# Patient Record
Sex: Male | Born: 1970
Health system: Southern US, Community
[De-identification: ages and names within clinical notes are randomized; demographics above are authoritative.]

## PROBLEM LIST (undated history)

## (undated) DIAGNOSIS — F1491 Cocaine use, unspecified, in remission: Secondary | ICD-10-CM

## (undated) DIAGNOSIS — R509 Fever, unspecified: Secondary | ICD-10-CM

## (undated) DIAGNOSIS — F411 Generalized anxiety disorder: Secondary | ICD-10-CM

## (undated) DIAGNOSIS — K604 Rectal fistula: Secondary | ICD-10-CM

## (undated) DIAGNOSIS — F1210 Cannabis abuse, uncomplicated: Secondary | ICD-10-CM

## (undated) DIAGNOSIS — R739 Hyperglycemia, unspecified: Secondary | ICD-10-CM

## (undated) DIAGNOSIS — F329 Major depressive disorder, single episode, unspecified: Secondary | ICD-10-CM

## (undated) DIAGNOSIS — N200 Calculus of kidney: Secondary | ICD-10-CM

## (undated) DIAGNOSIS — J45909 Unspecified asthma, uncomplicated: Secondary | ICD-10-CM

## (undated) DIAGNOSIS — Z87898 Personal history of other specified conditions: Secondary | ICD-10-CM

## (undated) DIAGNOSIS — K6289 Other specified diseases of anus and rectum: Secondary | ICD-10-CM

## (undated) DIAGNOSIS — J309 Allergic rhinitis, unspecified: Secondary | ICD-10-CM

## (undated) HISTORY — DX: Generalized anxiety disorder: F41.1

## (undated) HISTORY — DX: Fever, unspecified: R50.9

## (undated) HISTORY — DX: Cannabis abuse, uncomplicated: F12.10

## (undated) HISTORY — DX: Calculus of kidney: N20.0

## (undated) HISTORY — DX: Cocaine use, unspecified, in remission: F14.91

## (undated) HISTORY — DX: Major depressive disorder, single episode, unspecified: F32.9

## (undated) HISTORY — DX: Other specified diseases of anus and rectum: K62.89

## (undated) HISTORY — PX: OTHER SURGICAL HISTORY: SHX169

## (undated) HISTORY — DX: Rectal fistula: K60.4

## (undated) HISTORY — DX: Allergic rhinitis, unspecified: J30.9

## (undated) HISTORY — PX: FINGER SURGERY: SHX640

## (undated) HISTORY — DX: Personal history of other specified conditions: Z87.898

## (undated) HISTORY — PX: TONSILLECTOMY: SUR1361

## (undated) HISTORY — DX: Hyperglycemia, unspecified: R73.9

## (undated) HISTORY — DX: Unspecified asthma, uncomplicated: J45.909

---

## 2001-01-21 ENCOUNTER — Emergency Department (HOSPITAL_COMMUNITY): Admission: EM | Admit: 2001-01-21 | Discharge: 2001-01-21 | Payer: Self-pay | Admitting: Emergency Medicine

## 2001-01-21 ENCOUNTER — Encounter: Payer: Self-pay | Admitting: Emergency Medicine

## 2001-07-13 ENCOUNTER — Emergency Department (HOSPITAL_COMMUNITY): Admission: EM | Admit: 2001-07-13 | Discharge: 2001-07-13 | Payer: Self-pay | Admitting: Emergency Medicine

## 2001-07-13 ENCOUNTER — Encounter: Payer: Self-pay | Admitting: Emergency Medicine

## 2003-07-03 ENCOUNTER — Emergency Department (HOSPITAL_COMMUNITY): Admission: EM | Admit: 2003-07-03 | Discharge: 2003-07-03 | Payer: Self-pay | Admitting: Emergency Medicine

## 2004-08-01 ENCOUNTER — Observation Stay (HOSPITAL_COMMUNITY): Admission: EM | Admit: 2004-08-01 | Discharge: 2004-08-02 | Payer: Self-pay | Admitting: Emergency Medicine

## 2004-08-06 ENCOUNTER — Ambulatory Visit (HOSPITAL_COMMUNITY): Admission: RE | Admit: 2004-08-06 | Discharge: 2004-08-07 | Payer: Self-pay | Admitting: Orthopedic Surgery

## 2004-10-08 ENCOUNTER — Ambulatory Visit: Payer: Self-pay | Admitting: Internal Medicine

## 2005-03-31 ENCOUNTER — Ambulatory Visit: Payer: Self-pay | Admitting: Internal Medicine

## 2005-04-06 ENCOUNTER — Ambulatory Visit: Payer: Self-pay | Admitting: Internal Medicine

## 2005-04-16 ENCOUNTER — Ambulatory Visit: Payer: Self-pay | Admitting: Internal Medicine

## 2005-09-08 ENCOUNTER — Ambulatory Visit: Payer: Self-pay | Admitting: Internal Medicine

## 2005-09-15 ENCOUNTER — Emergency Department (HOSPITAL_COMMUNITY): Admission: EM | Admit: 2005-09-15 | Discharge: 2005-09-15 | Payer: Self-pay | Admitting: Emergency Medicine

## 2006-08-08 ENCOUNTER — Ambulatory Visit: Payer: Self-pay | Admitting: Endocrinology

## 2007-02-16 ENCOUNTER — Encounter: Payer: Self-pay | Admitting: Endocrinology

## 2007-02-16 DIAGNOSIS — F411 Generalized anxiety disorder: Secondary | ICD-10-CM

## 2007-02-16 HISTORY — DX: Generalized anxiety disorder: F41.1

## 2007-02-22 ENCOUNTER — Observation Stay (HOSPITAL_COMMUNITY): Admission: EM | Admit: 2007-02-22 | Discharge: 2007-02-23 | Payer: Self-pay | Admitting: Emergency Medicine

## 2007-03-13 ENCOUNTER — Emergency Department (HOSPITAL_COMMUNITY): Admission: EM | Admit: 2007-03-13 | Discharge: 2007-03-14 | Payer: Self-pay | Admitting: Emergency Medicine

## 2007-06-08 DIAGNOSIS — K604 Rectal fistula, unspecified: Secondary | ICD-10-CM

## 2007-06-08 HISTORY — DX: Rectal fistula, unspecified: K60.40

## 2007-06-08 HISTORY — DX: Rectal fistula: K60.4

## 2007-09-12 ENCOUNTER — Ambulatory Visit: Payer: Self-pay | Admitting: Internal Medicine

## 2007-09-12 DIAGNOSIS — J309 Allergic rhinitis, unspecified: Secondary | ICD-10-CM

## 2007-09-12 DIAGNOSIS — F329 Major depressive disorder, single episode, unspecified: Secondary | ICD-10-CM

## 2007-09-12 DIAGNOSIS — F3289 Other specified depressive episodes: Secondary | ICD-10-CM

## 2007-09-12 HISTORY — DX: Major depressive disorder, single episode, unspecified: F32.9

## 2007-09-12 HISTORY — DX: Allergic rhinitis, unspecified: J30.9

## 2007-09-12 HISTORY — DX: Other specified depressive episodes: F32.89

## 2007-09-14 ENCOUNTER — Telehealth: Payer: Self-pay | Admitting: Internal Medicine

## 2008-05-28 ENCOUNTER — Emergency Department (HOSPITAL_COMMUNITY): Admission: EM | Admit: 2008-05-28 | Discharge: 2008-05-29 | Payer: Self-pay | Admitting: Emergency Medicine

## 2009-01-22 ENCOUNTER — Ambulatory Visit: Payer: Self-pay | Admitting: Diagnostic Radiology

## 2009-01-22 ENCOUNTER — Emergency Department (HOSPITAL_BASED_OUTPATIENT_CLINIC_OR_DEPARTMENT_OTHER): Admission: EM | Admit: 2009-01-22 | Discharge: 2009-01-22 | Payer: Self-pay | Admitting: Emergency Medicine

## 2009-12-17 ENCOUNTER — Ambulatory Visit: Payer: Self-pay | Admitting: Internal Medicine

## 2009-12-17 ENCOUNTER — Telehealth: Payer: Self-pay | Admitting: Internal Medicine

## 2009-12-17 DIAGNOSIS — J45909 Unspecified asthma, uncomplicated: Secondary | ICD-10-CM | POA: Insufficient documentation

## 2009-12-17 DIAGNOSIS — N529 Male erectile dysfunction, unspecified: Secondary | ICD-10-CM

## 2009-12-17 HISTORY — DX: Unspecified asthma, uncomplicated: J45.909

## 2010-01-06 ENCOUNTER — Telehealth: Payer: Self-pay | Admitting: Internal Medicine

## 2010-01-08 ENCOUNTER — Telehealth: Payer: Self-pay | Admitting: Internal Medicine

## 2010-04-24 ENCOUNTER — Telehealth: Payer: Self-pay | Admitting: Internal Medicine

## 2010-07-07 NOTE — Progress Notes (Signed)
Summary: ALT med  Phone Note Call from Patient Call back at Home Phone [phone]   Caller: Patient Summary of Call: Pt called stating that Cymbalta has no generic and brand name is very expensive. Pt is requesting alternate generic medication. [facility] Initial call taken by: Margaret Pyle, CMA,  January 08, 2010 12:05 PM  Follow-up for Phone Call        ok to change to fluoxetine 20 mg per day - done per emr  Follow-up by: [name] MD,  January 08, 2010 12:37 PM  Additional Follow-up for Phone Call Additional follow up Details #1::        Pt informed via VM, told to call with any further questions or concerns Additional Follow-up by: Margaret Pyle, CMA,  January 08, 2010 1:15 PM    New/Updated Medications: FLUOXETINE HCL 20 MG CAPS (FLUOXETINE HCL) 1po once daily Prescriptions: FLUOXETINE HCL 20 MG CAPS (FLUOXETINE HCL) 1po once daily  #90 x 3   Entered and Authorized by:   [name] MD   Signed by:   [name] MD on 01/08/2010   Method used:   Electronically to        [facility]* (retail)       [address]       [place], [place]  [id number]       Ph: [phone]       Fax: [phone]   RxID:   [id number]

## 2010-07-07 NOTE — Progress Notes (Signed)
Summary: Medication change  Phone Note From Pharmacy   Caller: [facility]* Summary of Call: Pharmacy stating the patient is uninsured, is there a generic or cheaper drug that can be substituted for Cymbalta? Initial call taken by: [name] Ewing CMA Duncan Dull),  January 06, 2010 3:04 PM  Follow-up for Phone Call        pt must initiate call for any change of med/thanks Follow-up by: [name] MD,  January 06, 2010 3:17 PM

## 2010-07-07 NOTE — Progress Notes (Signed)
Summary: Symbicort Pt Assistance  Phone Note Call from Patient Call back at [facility] Phone [phone]   Summary of Call: PT CAN'T AFFORD THE SYMBICORT. [place] HAS A PT ASSISTANCE PROGRAM TO HELP PAY FOR IT FOR A YEAR.  HE WILL PRINT OUT THE FORM TO BRING IN TO BE SENT IN.  HE WANTS SAMPLES TO LAST UNTIL THIS IS FINISHED.  HE HAS 9 PUFFS LEFT ON HIS INHALER. Initial call taken by: Hilarie Fredrickson,  January 08, 2010 1:46 PM  Follow-up for Phone Call        2 inhalers provided to pt. PT Asst forwarded to [facility] until pt bring in paperwork Follow-up by: Margaret Pyle, CMA,  January 08, 2010 1:52 PM  Additional Follow-up for Phone Call Additional follow up Details #1::        Closed phone note while awaiting patient paperwork to be brought to the office. Additional Follow-up by: Lucious Groves CMA,  January 12, 2010 1:35 PM

## 2010-07-07 NOTE — Progress Notes (Signed)
Summary: Rx req  Phone Note Call from Patient Call back at Home Phone [phone]   Caller: Patient Summary of Call: Pt called requesting Rx for Cymbalta. [facility] Initial call taken by: Margaret Pyle, CMA,  December 17, 2009 1:39 PM  Follow-up for Phone Call        routine to robin Follow-up by: [name] MD,  December 17, 2009 4:46 PM    New/Updated Medications: CYMBALTA 30 MG CPEP (DULOXETINE HCL) 1 by mouth once daily Prescriptions: CYMBALTA 30 MG CPEP (DULOXETINE HCL) 1 by mouth once daily  #30 x 6   Entered by:   Scharlene Gloss CMA (AAMA)   Authorized by:   [name] MD   Signed by:   Scharlene Gloss CMA (AAMA) on 12/17/2009   Method used:   Electronically to        [facility]* (retail)       [address]       [place], [place]  [id number]       Ph: [phone]       Fax: [phone]   RxID:   [id number]

## 2010-07-07 NOTE — Progress Notes (Signed)
Summary: Rx change  Phone Note From Pharmacy   Caller: [facility] [phone] Summary of Call: Pharmacy called stating pt has Rx for Albuterol Rx that cost $45+. Pt is requesting to have RX changed to Ventolin which is $15 but pharmacy needs MD okay and refills (only 1 fill available). Okay to change inhaler and send with 3 refills to [facility]? Initial call taken by: Margaret Pyle, CMA,  April 24, 2010 10:05 AM  Follow-up for Phone Call        ok to change Follow-up by: [name] MD,  April 24, 2010 1:33 PM    New/Updated Medications: VENTOLIN HFA 108 (90 BASE) MCG/ACT AERS (ALBUTEROL SULFATE) use 2 puffs four times a day as needed Prescriptions: VENTOLIN HFA 108 (90 BASE) MCG/ACT AERS (ALBUTEROL SULFATE) use 2 puffs four times a day as needed  #1 x 3   Entered by:   Scharlene Gloss CMA (AAMA)   Authorized by:   [name] MD   Signed by:   Scharlene Gloss CMA (AAMA) on 04/24/2010   Method used:   Faxed to ...       [facility] (retail)       [address]       [place], [place]  [address]       Ph: [phone]       Fax: [phone]   RxID:   [id number]

## 2010-07-07 NOTE — Assessment & Plan Note (Signed)
Summary: difficulty breathing-lb   Vital Signs:  Patient profile:   40 year old male Height:      75 inches Weight:      392 pounds BMI:     49.17 O2 Sat:      95 % on Room air Temp:     98.5 degrees F oral Pulse rate:   66 / minute BP sitting:   130 / 84  (left arm) Cuff size:   large  Vitals Entered By: Zella Ball Ewing CMA Duncan Dull) (December 17, 2009 11:34 AM)  O2 Flow:  Room air CC: Difficulty breathing, right foot pain and  inflamation, refills/RE   CC:  Difficulty breathing, right foot pain and  inflamation, and refills/RE.  History of Present Illness: laid off from work apr 2009 , no insurance at this time;  has seasonal allergies , as well as ? perennial and finds it hard to get rid of the cat; here today iwth incr sob for 2 wks; fiancee states he wheezes at night but it does not wake him up;  goes to the gym 3 time per wk (was more often but had some plantar fasciits);  still doing some cardio such as treadmill  and "feels like I'm breathing through a straw."   feels tight, cant take big deep breath. Feels like when an inhaler helped  before.  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  Worse with swiimming in the pool .  no fever or signicant cough except for mild phelgm to back of throat to clear .   Denies worsening depressive symtpoms, suicidal ideation or panic  Preventive Screening-Counseling & Management  Alcohol-Tobacco     Smoking Status: quit      Drug Use:  no.    Problems Prior to Update: 1)  Erectile Dysfunction, Organic  (ICD-607.84) 2)  Asthma  (ICD-493.90) 3)  Preventive Health Care  (ICD-V70.0) 4)  Depression  (ICD-311) 5)  Allergic Rhinitis  (ICD-477.9) 6)  Anxiety  (ICD-300.00)  Medications Prior to Update: 1)  Dexedrine 15 Mg  Cp24 (Dextroamphetamine Sulfate) .... Take 1 By Mouth Three Times A Day Prn 2)  Trazodone Hcl 50 Mg  Tabs (Trazodone Hcl) .... Take 1-2 By Mouth At Bedtime Prn 3)  Cymbalta 30 Mg  Cpep  (Duloxetine Hcl) .... Take 1 By Mouth Qd 4)  Wellbutrin Xl 300 Mg  Tb24 (Bupropion Hcl) .Marland Kitchen.. 1po Qd 5)  Cetirizine Hcl 10 Mg  Tabs (Cetirizine Hcl) .Marland Kitchen.. 1 By Mouth Once Daily Prn  Current Medications (verified): 1)  Cetirizine Hcl 10 Mg  Tabs (Cetirizine Hcl) .Marland Kitchen.. 1 By Mouth Once Daily As Needed 2)  Symbicort 160-4.5 Mcg/act Aero (Budesonide-Formoterol Fumarate) .... 2 Puffs Two Times A Day 3)  Proair Hfa 108 (90 Base) Mcg/act Aers (Albuterol Sulfate) .... 2 Puffs Four Times Per Day As Needed Wheezing 4)  Prednisone 10 Mg Tabs (Prednisone) .... 4po Qd For 3days, Then 3po Qd For 3days, Then 2po Qd For 3days, Then 1po Qd For 3 Days, Then Stop 5)  Cialis 20 Mg Tabs (Tadalafil) .Marland Kitchen.. 1po Every Other Day As Needed  Allergies (verified): No Known Drug Allergies  Past History:  Past Surgical History: Last updated: 09/12/2007 s/p left knee ant cruciate ligament repair  Family History: Last updated: 09/12/2007 mother with MI at 47yo  Social History: Last updated: 12/17/2009 Alcohol use-yes laid off as RF engineer for [employer] Single, but with fiancee Drug use-no - quit marijuana - since  2009 Former Smoker - quit june 2011 no children  Risk Factors: Smoking Status: quit (12/17/2009)  Past Medical History: Anxiety Smoker  ADD Allergic rhinitis Depression Asthma  Family History: Reviewed history from 09/12/2007 and no changes required. mother with MI at 61yo  Social History: Reviewed history from 09/12/2007 and no changes required. 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   Head:  normocephalic and atraumatic.   Eyes:  vision grossly intact, pupils equal, and pupils round.   Ears:  R ear normal and L  ear normal.   Nose:  no external deformity and no nasal discharge.   Mouth:  no gingival abnormalities and pharynx pink and moist.   Neck:  supple and no masses.   Lungs:  normal respiratory effort, R decreased breath sounds, and L decreased breath sounds.  and few wheezes  Heart:  normal rate and regular rhythm.   Extremities:  no edema, no erythema    Impression & Recommendations:  Problem # 1:  ASTHMA (ICD-493.90)  exam c/w today;  no insurance and does not want further eval for other cuases dyspnea to start;  will give depo IM today, and inhalers for home, and prednisone pack;  f/iu any persistence or worsening   His updated medication list for this problem includes:    Symbicort 160-4.5 Mcg/act Aero (Budesonide-formoterol fumarate) .Marland Kitchen... 2 puffs two times a day    Proair Hfa 108 (90 Base) Mcg/act Aers (Albuterol sulfate) .Marland Kitchen... 2 puffs four times per day as needed wheezing    Prednisone 10 Mg Tabs (Prednisone) .Marland KitchenMarland KitchenMarland KitchenMarland Kitchen 4po qd for 3days, then 3po qd for 3days, then 2po qd for 3days, then 1po qd for 3 days, then stop  Problem # 2:  ERECTILE DYSFUNCTION, ORGANIC (ICD-607.84)  for cialis trial rx  His updated medication list for this problem includes:    Cialis 20 Mg Tabs (Tadalafil) .Marland Kitchen... 1po every other day as needed  Problem # 3:  ALLERGIC RHINITIS (ICD-477.9)  His updated medication list for this problem includes:    Cetirizine Hcl 10 Mg Tabs (Cetirizine hcl) .Marland Kitchen... 1 by mouth once daily as needed treat as above, f/u any worsening signs or symptoms   Problem # 4:  ANXIETY (ICD-300.00)  The following medications were removed from the medication list:    Trazodone Hcl 50 Mg Tabs (Trazodone hcl) .Marland Kitchen... Take 1-2 by mouth at bedtime prn    Cymbalta 30 Mg Cpep (Duloxetine hcl) .Marland Kitchen... Take 1 by mouth qd    Wellbutrin Xl 300 Mg Tb24 (Bupropion hcl) .Marland Kitchen... 1po qd stable overall by hx and exam, ok to continue meds/tx as is - no meds needed at this time  Complete Medication List: 1)   Cetirizine Hcl 10 Mg Tabs (Cetirizine hcl) .Marland Kitchen.. 1 by mouth once daily as needed 2)  Symbicort 160-4.5 Mcg/act Aero (Budesonide-formoterol fumarate) .... 2 puffs two times a day 3)  Proair Hfa 108 (90 Base) Mcg/act Aers (Albuterol sulfate) .... 2 puffs four times per day as needed wheezing 4)  Prednisone 10 Mg Tabs (Prednisone) .... 4po qd for 3days, then 3po qd for 3days, then 2po qd for 3days, then 1po qd for 3 days, then stop  5)  Cialis 20 Mg Tabs (Tadalafil) .Marland Kitchen.. 1po every other day as needed  Patient Instructions: 1)  you had the steroid shot today 2)  Please take all new medications as prescribed  3)  Continue all previous medications as before this visit  4)  Please schedule a follow-up appointment as needed. Prescriptions: CIALIS 20 MG TABS (TADALAFIL) 1po every other day as needed  #5 x 11   Entered and Authorized by:   [name] MD   Signed by:   [name] MD on 12/17/2009   Method used:   Print then Give to Patient   RxID:   [id number] CIALIS 20 MG TABS (TADALAFIL) 1po every other day as needed  #3 x 0   Entered and Authorized by:   [name] MD   Signed by:   [name] MD on 12/17/2009   Method used:   Print then Give to Patient   RxID:   [id number] PREDNISONE 10 MG TABS (PREDNISONE) 4po qd for 3days, then 3po qd for 3days, then 2po qd for 3days, then 1po qd for 3 days, then stop  #30 x 0   Entered and Authorized by:   [name] MD   Signed by:   [name] MD on 12/17/2009   Method used:   Print then Give to Patient   RxID:   [id number] PROAIR HFA 108 (90 BASE) MCG/ACT AERS (ALBUTEROL SULFATE) 2 puffs four times per day as needed wheezing  #1 x 11   Entered and Authorized by:   [name] MD   Signed by:   [name] MD on 12/17/2009   Method used:   Print then Give to Patient   RxID:   [id number] SYMBICORT 160-4.5 MCG/ACT AERO (BUDESONIDE-FORMOTEROL FUMARATE) 2 puffs two times a day  #1 x 11   Entered and Authorized by:    [name] MD   Signed by:   [name] MD on 12/17/2009   Method used:   Print then Give to Patient   RxID:   [id number] CETIRIZINE HCL 10 MG  TABS (CETIRIZINE HCL) 1 by mouth once daily as needed  #30 x 11   Entered and Authorized by:   [name] MD   Signed by:   [name] MD on 12/17/2009   Method used:   Print then Give to Patient   RxID:   [id number]   Appended Document: Med/Alg Import      Medication Administration  Injection # 1:    Medication: Depo- Medrol 40mg     Diagnosis: ASTHMA (ICD-493.90)    Route: IM    Site: LUOQ gluteus    Exp Date: 09/2012    Lot #: [id number]    Mfr: Pharmacia    Given by: Zella Ball Ewing CMA Duncan Dull) (December 17, 2009 1:04 PM)  Injection # 2:    Medication: Depo- Medrol 80mg     Diagnosis: ASTHMA (ICD-493.90)    Route: IM    Site: LUOQ gluteus    Exp Date: 09/2012    Lot #: [id number]    Mfr: Pharmacia    Given by: Zella Ball Ewing CMA Duncan Dull) (December 17, 2009 1:04 PM)  Orders Added: 1)  Depo- Medrol 40mg  [J1030] 2)  Depo- Medrol 80mg  [J1040] 3)  Admin of Therapeutic Inj  intramuscular or subcutaneous [57322]

## 2010-10-20 NOTE — H&P (Signed)
NAMEDAVED, MCFANN NO.:  [id number]   MEDICAL RECORD NO.:  [id number]          PATIENT TYPE:  EMS   LOCATION:  ED                           FACILITY:  [facility]   PHYSICIAN:  Lorne Skeens. Hoxworth, M.D.DATE OF BIRTH:  04/11/1971   DATE OF ADMISSION:  02/22/2007  DATE OF DISCHARGE:                              HISTORY & PHYSICAL   CHIEF COMPLAINT:  Rectal pain.   HISTORY OF PRESENT ILLNESS:  The patient is a 40 year old white male  who, 3-4 days ago, noticed some tenderness and soreness in his perianal  area.  This got gradually worse over the last several days with  increasing pain, tenderness and some swelling.  He developed fever up to  102 and some chills last 24 hours and presented to [facility]  Emergency Room.  He has no previous history of any similar symptoms or  problems.   PAST MEDICAL HISTORY:  Surgery includes  1. Reconstruction as right hand following an injury.  2. Knee arthroscopy.   Medically, he is followed Dr. Oliver Barre.   Does not take any chronic medications.   Denies any serious medical illnesses.   MEDICATIONS:  None.   ALLERGIES:  NONE.   SOCIAL HISTORY:  Married, smokes half pack cigarettes per day.  Drinks  rarely.  Works in Clinical biochemist.   FAMILY HISTORY:  Noncontributory.   REVIEW OF SYSTEMS:  GENERAL:  Positive for fever.  HEENT:  No vision,  hearing, swallowing problems.  RESPIRATORY:  Denies shortness breath,  cough, wheezing.  CARDIAC:  Denies chest pain, palpitations, swelling,  history of heart disease.  ABDOMEN/GI:  Denies nausea, vomiting,  constipation, abdominal pain.   PHYSICAL EXAMINATION:  VITAL SIGNS:  Temperature is 102.7, pulse 115,  respirations 22, blood pressure 140/108.  GENERAL:  He is a morbidly obese white male in no acute distress.  SKIN:  Warm and dry.  No rash or infection.  HEENT:  No palpable mass or thyromegaly.  Sclerae nonicteric.  LUNGS:  Clear without wheezing or increased  work of breathing.  CARDIAC:  Regular rate and rhythm.  No murmurs.  No edema.  No JVD.  ABDOMEN:  Obese, soft, nontender.  RECTAL:  In the left perirectal space is a 5-6 cm area of swelling,  induration and erythema.  Quite tender.  EXTREMITIES:  No joint swelling or deformity.  NEUROLOGIC:  Alert and oriented.  Gait normal.   LABORATORY AND X-RAY DATA:  White count 13.3, hemoglobin normal.  Sodium  131, remainder of electrolytes, BUN and creatinine normal.  Urinalysis  negative.   ASSESSMENT/PLAN:  Left perirectal abscess.  The patient has received  intravenous antibiotics in the emergency room.  This will require  drainage under general anesthesia in the operating room.      Lorne Skeens. Hoxworth, M.D.  Electronically Signed     BTH/MEDQ  D:  02/22/2007  T:  02/22/2007  Job:  [id number]

## 2010-10-20 NOTE — Op Note (Signed)
NAMELERRY, CORDREY             ACCOUNT NO.:  [id number]   MEDICAL RECORD NO.:  [id number]          PATIENT TYPE:  OBV   LOCATION:  1337                         FACILITY:  [facility]   PHYSICIAN:  Sharlet Salina T. Hoxworth, M.D.DATE OF BIRTH:  07-16-1970   DATE OF PROCEDURE:  02/22/2007  DATE OF DISCHARGE:                               OPERATIVE REPORT   PRE-AND-POSTOPERATIVE DIAGNOSIS:  Ischiorectal abscess.   PROCEDURE:  I&D of ischiorectal abscess.   SURGEON:  Lorne Skeens. Hoxworth, M.D.   ANESTHESIA:  General.   BRIEF HISTORY:  Mr. Bewley is a 40 year old male who presents with 4  days of increasing pain, swelling, and tenderness in the left perirectal  area.  Examination significant for morbid obesity, and a good-sized area  of induration, redness, and tenderness immediately adjacent to the  rectum on the left side.  Incision and drainage has been recommended  under general anesthesia.  The nature of the procedure, indications,  risks of bleeding, infection, anesthetic complications were discussed  and understood.  He is now brought to the operating room for the  procedure.   DESCRIPTION OF OPERATION:  The patient brought to the operating room and  placed in the supine position on the operating table and general  endotracheal anesthesia was induced.  He was then carefully positioned  in the lithotomy position, and the perineum sterilely prepped and  draped.  Correct patient and procedure were verified.  He was already on  broad-spectrum IV antibiotics.  As close to the anus as possible, an  incision was made over the area of redness induration, and about 1.5 cm  deep a large abscess cavity was entered, and a large amount of foul-  smelling pus drained.  This was cultured.  A small skin button was  excised to allow adequate drainage and a few loculations were digitally  broken up.  The abscess was quite large measuring probably 6 cm in  diameter.  This was irrigated with saline  and aseptic syringe.  Hemostasis was obtained with cautery.  The area was infiltrated with  Marcaine with epinephrine.  The cavity was then packed with 1-inch  Iodoform gauze.  Dry sterile dressings were applied.  The sponge and  instrument counts were correct.  The patient was then transferred to the  recovery room in good condition.      Lorne Skeens. Hoxworth, M.D.  Electronically Signed     BTH/MEDQ  D:  02/22/2007  T:  02/23/2007  Job:  [id number]

## 2010-10-23 NOTE — Op Note (Signed)
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   PROCEDURE:  Revision right long finger amputation.   SURGEON.:  Nadara Mustard, MD   ANESTHESIA:  General.   ESTIMATED BLOOD LOSS:  Minimal.   ANTIBIOTICS:  One gram of Kefzol.   TOURNIQUET TIME:  None.   DISPOSITION:  To PACU in stable condition.   INDICATIONS FOR PROCEDURE:  The patient is a 40 year old gentleman status  post a crush injury to his right hand with a log splitter. He initially  underwent open reduction internal fixation of the right index proximal  phalanx as well as revision amputation of the long finger through the middle  phalanx. The patient had a large volar flap with a very small soft tissue  bridge and this completely died secondary to the nature of the crush injury  from the log splitter. The patient presents with intense pain with ischemic  necrosis of the palmar flap and presents at this time for revision  amputation. Risks and benefits were discussed including infection,  neurovascular injury, persistent pain, need for revision surgery. The  patient states he understands and wishes to proceed at this time.   DESCRIPTION OF PROCEDURE:  The patient was brought to OR room 6 amd  underwent a general anesthetic. After adequate level of anesthesia obtained,  the patient's right upper extremity was prepped using DuraPrep and draped  into a sterile field. The wound edges were excised approximately 2 mm from  the edge of the wound and the entire necrotic volar flap was completely  excised. The bone was  rongeured back approximately half a centimeter and  this allowed for the distal flap to close. The nerve and neurologic bundles  digitally on both sides were pulled, cut and allowed to retract. The wound  was irrigated with normal saline. The patient had good petechial bleeding  around the wound edges. This appeared to have good viability for healing.  The wound was closed using 4-0 nylon without  any tension on the skin. The wound was covered with Adaptic orthopedic  sponges, sterile Kling and a Coban dressing. The patient was extubated,  taken to PACU in stable condition.  Plan for 24 observation, IV antibiotics,  discharge to home after pain is adequately controlled.      MVD/MEDQ  D:  08/06/2004  T:  08/07/2004  Job:  [id number]

## 2010-10-23 NOTE — H&P (Signed)
NAME:  Larry Fowler, Larry Fowler NO.:  [id number]   MEDICAL RECORD NO.:  [id number]          PATIENT TYPE:  EMS   LOCATION:  MAJO                         FACILITY:  [facility]   PHYSICIAN:  Nadara Mustard, MD     DATE OF BIRTH:  [birth year]   DATE OF ADMISSION:  08/01/2004  DATE OF DISCHARGE:                                HISTORY & PHYSICAL   HISTORY OF PRESENT ILLNESS:  The patient is a 40 year old gentleman who was  working a log splitter approximately 3 p.m. when he had his  right hand  caught in the log splitter sustaining an injury to the index and long  finger.  The injury was approximately 3 p.m.  Orthopedic consult was called  at 5:15 p.m. and the patient was seen at approximately 5:30 p.m.   PAST MEDICAL HISTORY:  Positive for knee arthroscopy.   No known drug allergies.   MEDICATIONS:  None.   REVIEW OF SYSTEMS:  Negative for hypertension.  Negative for diabetes.   FAMILY HISTORY:  Positive for diabetes in his mother.   SOCIAL HISTORY:  He smokes one pack per day.  Works for [employer] in  [place].   PHYSICAL EXAMINATION:  VITAL SIGNS:  Temperature 97.8, blood pressure  144/81, pulse 75, respiratory rate 20.  GENERAL:  He is in no acute distress.  LUNGS:  Clear to auscultation.  CARDIOVASCULAR:  Regular rate and rhythm.  NECK:  Supple.  No bruits.  NEUROLOGIC:  The patient is left hand dominant.  EXTREMITIES:  Examination of the right upper extremity, he has a complete  amputation of the long finger through the DIP joint.  He does have a long  flap with approximately 3-mm of volar base to the flap.  This will be used  for a reconstruction.  Examination of the index finger, he has a crush  injury over the MCP joint with an open fracture.  Flexor and extensor  tendons are intact.  His finger is neurovascularly intact.  He does have  good sensation.   Radiographs show the amputation at the PIP joint of the long finger and a  crush open fracture of  the base of the proximal phalanx of the index finger.   ASSESSMENT:  1.  Crush injury, right hand.  2.  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   PLAN:  The patient will be emergently brought to the operating room for  surgical intervention.  The risks and benefits were discussed with the  patient including infection, neurovascular injury, need for revision  amputation to the long finger, possible ischemia to the index finger due to  the nature of the crush injury with a log splitter.  The patient states he  understands and wishes to proceed at this time.      MVD/MEDQ  D:  08/01/2004  T:  08/01/2004  Job:  [id number]

## 2010-10-23 NOTE — Op Note (Signed)
Larry Fowler, Larry Fowler               ACCOUNT NO.:  [id number]   MEDICAL RECORD NO.:  [id number]          PATIENT TYPE:  INP   LOCATION:  5729                         FACILITY:  [facility]   PHYSICIAN:  Nadara Mustard, MD     DATE OF BIRTH:  Jun 17, 1970   DATE OF PROCEDURE:  08/01/2004  DATE OF DISCHARGE:  08/02/2004                                 OPERATIVE REPORT   PREOPERATIVE DIAGNOSES:  1.  Amputation the right long finger to the DIP joint.  2.  Open fracture of the right index finger proximal phalanx, right hand.   POSTOPERATIVE DIAGNOSES:  1.  Amputation the right long finger to the DIP joint.  2.  Open fracture of the right index finger proximal phalanx, right hand.   PROCEDURE:  1.  Irrigation debridement, revision amputation of right long finger through      the middle phalanx distally.  2.  Irrigation debridement of the right index finger with open reduction      internal fixation of the proximal phalanx of the right index finger.   SURGEON.:  Nadara Mustard, MD   ANESTHESIA:  General.   ESTIMATED BLOOD LOSS:  Minimal.   ANTIBIOTICS:  1 gram of Kefzol at the time of admission to the emergency  room at approximately 5:00 p.m., the second gram of Kefzol intraoperatively.   DRAINS:  None.   COMPLICATIONS:  None.   TOURNIQUET TIME:  None.   DISPOSITION:  To PACU in stable condition.   INDICATIONS FOR PROCEDURE:  The patient is a 40 year old gentleman with a  crush injury from a log splitter to the right hand involving the long finger  and index finger. The patient was evaluated in the ER, felt that emergent  surgical intervention was needed for salvage of his hand and presents at  this time for surgical intervention.  The risks and benefits were discussed  including need for further revision amputation of the long finger, possible  ischemia to the index finger due to the nature of the crush injury. The  patient states he understands and wished to proceed at this  time.   DESCRIPTION OF PROCEDURE:  The patient was brought to OR room 5 and  underwent a general anesthetic. After adequate level of anesthesia obtained,  the patient's right upper extremity was prepped using Betadine scrub and  paint and draped into a sterile field. Attention was first focused to the  long finger. There was a very long palmar based flap with approximately 3 to  4 mm of a bridge of skin still connected to the palmar flap. The finger was  amputated just proximal to the PIP joint and the two flaps of tissue were  used to advance over the distal finger. There was petechial bleeding on both  flaps, however, the palmar based flap had minimal petechial bleeding and as  discussed with the patient, this has a low probability of survivability, but  it allowed the patient to a maintain the longest length of his finger. This  may require further amputation. The skin closed without any tension on the  skin and this was closed with simple 4-0 nylon suture. Attention was then  focused to the large dorsal laceration across the MCP joint. This was  extended proximally and distally and extended proximal radial to distal  ulnar over the dorsal aspect of the MCP and proximal phalanx. The extensor  tendon was intact. The fracture was identified, cleansed and reduced.  This  was crushed and the fragments were elevated and stabilized with two 2.0 mm  screws.  C-arm fluoroscopy verified reduction in both AP and lateral planes.  The patient had no rotational deformity, had good flexion extension of the  finger passively after repair. The wounds were again irrigated. The skin was  closed using 4-0 nylon. The wounds were covered with Adaptic orthopedic  sponges, sterile Kerlix and a Coban dressing. The patient was extubated,  taken to PACU in stable condition.      MVD/MEDQ  D:  08/01/2004  T:  08/03/2004  Job:  [id number]

## 2010-12-03 ENCOUNTER — Other Ambulatory Visit: Payer: Self-pay | Admitting: Internal Medicine

## 2011-03-18 LAB — CBC
HCT: 40.1
Hemoglobin: 13.8
MCV: 94.7
Platelets: 177
RBC: 4.24
RDW: 14.4 — ABNORMAL HIGH

## 2011-03-18 LAB — URINALYSIS, ROUTINE W REFLEX MICROSCOPIC
Glucose, UA: NEGATIVE
Hgb urine dipstick: NEGATIVE
Nitrite: NEGATIVE
Protein, ur: 30 — AB
Urobilinogen, UA: 1
Urobilinogen, UA: 1
pH: 5.5
pH: 6

## 2011-03-18 LAB — CULTURE, ROUTINE-ABSCESS

## 2011-03-18 LAB — CULTURE, BLOOD (ROUTINE X 2)

## 2011-03-18 LAB — ANAEROBIC CULTURE

## 2011-03-18 LAB — BASIC METABOLIC PANEL
BUN: 16
CO2: 24
GFR calc Af Amer: >60
Glucose, Bld: 110 — ABNORMAL HIGH
Potassium: 3.8

## 2011-03-18 LAB — DIFFERENTIAL
Eosinophils Relative: 1
Lymphocytes Relative: 22
Lymphs Abs: 2.9
Neutro Abs: 9.6 — ABNORMAL HIGH
Neutrophils Relative %: 72

## 2011-03-18 LAB — URINE CULTURE

## 2011-03-18 LAB — URINE MICROSCOPIC-ADD ON

## 2011-04-02 ENCOUNTER — Other Ambulatory Visit: Payer: Self-pay

## 2011-04-02 MED ORDER — ALBUTEROL SULFATE HFA 108 (90 BASE) MCG/ACT IN AERS: 2.0000 | INHALATION_SPRAY | Freq: Four times a day (QID) | RESPIRATORY_TRACT | Status: DC | PRN

## 2011-04-05 ENCOUNTER — Encounter (INDEPENDENT_AMBULATORY_CARE_PROVIDER_SITE_OTHER): Payer: Self-pay | Admitting: Surgery

## 2011-04-05 ENCOUNTER — Ambulatory Visit (INDEPENDENT_AMBULATORY_CARE_PROVIDER_SITE_OTHER): Payer: Self-pay | Admitting: Surgery

## 2011-04-05 VITALS — BP 132/88 | HR 64 | Temp 97.9°F | Resp 20 | Ht 75.0 in | Wt >= 6400 oz

## 2011-04-05 DIAGNOSIS — K610 Anal abscess: Secondary | ICD-10-CM

## 2011-04-05 DIAGNOSIS — K612 Anorectal abscess: Secondary | ICD-10-CM

## 2011-04-05 MED ORDER — AMOXICILLIN-POT CLAVULANATE 875-125 MG PO TABS: 1.0000 | ORAL_TABLET | Freq: Two times a day (BID) | ORAL | Status: AC

## 2011-04-05 NOTE — Progress Notes (Signed)
Subjective:     Patient ID: Larry Fowler, male   DOB: 07/27/1970, 40 y.o.   MRN: [id number]  HPI  Patient Care Team: Oliver Barre, MD as PCP - General Currie Paris, MD as Consulting Physician (General Surgery)  This patient is a 40 y.o.male who presents today for surgical evaluation.   Reason for visit: Perianal pain. Question recurrent abscess/fistula  Patient is a morbidly obese male who's head and a perianal abscess he is in the past. He recalls 3 prior to this. He's been followed by Dr. Jamey Ripa in our group. He's had incision and drainage in our office. There's been concern that maybe he has a fistula.   He does note last week he was having some mild discomfort. It became worse 2 days ago. Some comfortable sit. He does any major drainage. He normally has a bowel movement twice a day; however, he had some loose stools this past weekend.  Because the pain worsened he was worried he had an abscess. Therefore he came in for evaluation. No history of inflammatory bowel disease or irritable bowel syndrome. No family history of of of either. No GI problems. No via diabetes. Does not smoke tobacco. Occasional marijuana  Past Medical History  Diagnosis Date  . Rectal fistula 2009  . Chills with fever   . Rectal pain     Past Surgical History  Procedure Date  . Finger surgery 2008/09?    index finger cut off     History   Social History  . Marital Status: Single    Spouse Name: N/A    Number of Children: N/A  . Years of Education: N/A   Occupational History  . Not on file.   Social History Main Topics  . Smoking status: Former Smoker    Quit date: 03/01/2011  . Smokeless tobacco: Never Used  . Alcohol Use: 3.6 oz/week    6 Cans of beer per week  . Drug Use: Yes    Special: Marijuana  . Sexually Active:    Other Topics Concern  . Not on file   Social History Narrative  . No narrative on file    History reviewed. No pertinent family history.  Current  outpatient prescriptions:amoxicillin-clavulanate (AUGMENTIN) 875-125 MG per tablet, Take 1 tablet by mouth 2 (two) times daily., Disp: 14 tablet, Rfl: 3  No Known Allergies     Review of Systems  Constitutional: Positive for fever and chills. Negative for diaphoresis and fatigue.  HENT: Negative for nosebleeds, sore throat, facial swelling, mouth sores, trouble swallowing and ear discharge.   Eyes: Negative for photophobia, discharge and visual disturbance.  Respiratory: Negative for choking, chest tightness, shortness of breath and stridor.   Cardiovascular: Negative for chest pain and palpitations.  Gastrointestinal: Positive for diarrhea and rectal pain. Negative for nausea, vomiting, abdominal pain, constipation, blood in stool, abdominal distention and anal bleeding.       Usually BM 2x/ day  Genitourinary: Negative for dysuria, urgency, difficulty urinating and testicular pain.  Musculoskeletal: Negative for myalgias, back pain, arthralgias and gait problem.  Skin: Negative for color change, pallor, rash and wound.  Neurological: Negative for dizziness, speech difficulty, weakness, numbness and headaches.  Hematological: Negative for adenopathy. Does not bruise/bleed easily.  Psychiatric/Behavioral: Negative for hallucinations, confusion and agitation.       Objective:   Physical Exam  Constitutional: He is oriented to person, place, and time. He appears well-developed and well-nourished. No distress.       Super  morbidly obese  HENT:  Head: Normocephalic.  Mouth/Throat: Oropharynx is clear and moist. No oropharyngeal exudate.  Eyes: Conjunctivae and EOM are normal. Pupils are equal, round, and reactive to light. No scleral icterus.  Neck: Normal range of motion. Neck supple. No tracheal deviation present.  Cardiovascular: Normal rate, regular rhythm and intact distal pulses.   Pulmonary/Chest: Effort normal and breath sounds normal. No respiratory distress.  Abdominal: Soft.  He exhibits no distension. There is no tenderness. Hernia confirmed negative in the right inguinal area and confirmed negative in the left inguinal area.  Genitourinary: Penis normal. No penile tenderness.       Perianal skin clean with okay hygiene.  No pruritis.  Normal sphincter tone.  No fissure.  No pilonidal disease.    L ant draining abscess. 1cm induration.  Does not tunnel deeper than 1 cm.  Very sensitive  Tolerates digital and anoscopic rectal exam.  No rectal masses/abscess.  Hemorrhoidal piles WNL   Musculoskeletal: Normal range of motion. He exhibits no tenderness.  Lymphadenopathy:    He has no cervical adenopathy.       Right: No inguinal adenopathy present.       Left: No inguinal adenopathy present.  Neurological: He is alert and oriented to person, place, and time. No cranial nerve deficit. He exhibits normal muscle tone. Coordination normal.  Skin: Skin is warm and dry. No rash noted. He is not diaphoretic. No erythema. No pallor.  Psychiatric: He has a normal mood and affect. His behavior is normal. Judgment and thought content normal.       Assessment:     Draining perianal abscess. Possible fistula given recurrent infections.    Plan:     Augmentin antibiotic x7 days. He were to much that would cost as he does not have insurance. He asked for samples. I noted we did not have any. I noted he can call and see if he can switch. However, cautioned be too bad and I think this is a better drugs and drainage of a combination.  Warm sitz baths. I gave him a dose on anal abscess treatment.  Return to clinic in about 2 weeks. If the area has not healed up completely, consider examination under anesthesia for possible fistulotomy plus or minus seton plus or minus LIFT procedure. I spent extra in continuity and hand outs. I think he understands. He was under the assumption that he would need a colostomy to fix this. I noted that it is rare that it comes to that. He usually  this is an outpatient procedure. The key as close followup to avoid recurrences I detached them early.   He was reading about LAP-BAND as well. Body thank you could benefit from a weight loss surgery, I would wait until his infections have resolved.

## 2011-04-05 NOTE — Patient Instructions (Signed)
Peri-Rectal Abscess Your caregiver has diagnosed you as having a peri-rectal abscess. This is an infected area near the rectum that is filled with pus. If the abscess is near the surface of the skin, your caregiver may open (incise) the area and drain the pus. HOME CARE INSTRUCTIONS   If your abscess was opened up and drained. A small piece of gauze may be placed in the opening so that it can drain. Do not remove the gauze unless directed by your caregiver.   A loose dressing may be placed over the abscess site. Change the dressing as often as necessary to keep it clean and dry.   After the drain is removed, the area may be washed with a gentle antiseptic (soap) four times per day.   A warm sitz bath, warm packs or heating pad may be used for pain relief, taking care not to burn yourself.   Return for a wound check in 1 day or as directed.   An "inflatable doughnut" may be used for sitting with added comfort. These can be purchased at a drugstore or medical supply house.   To reduce pain and straining with bowel movements, eat a high fiber diet with plenty of fruits and vegetables. Use stool softeners as recommended by your caregiver. This is especially important if narcotic type pain medications were prescribed as these may cause marked constipation.   Only take over-the-counter or prescription medicines for pain, discomfort, or fever as directed by your caregiver.  SEEK IMMEDIATE MEDICAL CARE IF:   You have increasing pain that is not controlled by medication.   There is increased inflammation (redness), swelling, bleeding, or drainage from the area.   An oral temperature above 102 F (38.9 C) develops.   You develop chills or generalized malaise (feel lethargic or feel "washed out").     You develop any new symptoms (problems) you feel may be related to your present problem.  Document Released: 05/21/2000 Document Revised: 02/03/2011 Document Reviewed: 05/21/2008 [facility]  Patient Information 2012 [place], [place].

## 2011-04-26 ENCOUNTER — Encounter (INDEPENDENT_AMBULATORY_CARE_PROVIDER_SITE_OTHER): Payer: Self-pay | Admitting: Surgery

## 2011-05-03 ENCOUNTER — Emergency Department (HOSPITAL_COMMUNITY)
Admission: EM | Admit: 2011-05-03 | Discharge: 2011-05-03 | Discharge status: Against Medical Advice | Payer: Self-pay | Attending: Emergency Medicine | Admitting: Emergency Medicine

## 2011-05-03 DIAGNOSIS — M25519 Pain in unspecified shoulder: Secondary | ICD-10-CM | POA: Insufficient documentation

## 2011-05-03 DIAGNOSIS — R079 Chest pain, unspecified: Secondary | ICD-10-CM | POA: Insufficient documentation

## 2011-05-18 ENCOUNTER — Encounter (INDEPENDENT_AMBULATORY_CARE_PROVIDER_SITE_OTHER): Payer: Self-pay | Admitting: Surgery

## 2011-12-24 ENCOUNTER — Other Ambulatory Visit: Payer: Self-pay

## 2011-12-24 MED ORDER — ALBUTEROL SULFATE HFA 108 (90 BASE) MCG/ACT IN AERS: 2.0000 | INHALATION_SPRAY | Freq: Four times a day (QID) | RESPIRATORY_TRACT | Status: DC | PRN

## 2013-04-02 ENCOUNTER — Emergency Department (HOSPITAL_BASED_OUTPATIENT_CLINIC_OR_DEPARTMENT_OTHER): Payer: Self-pay

## 2013-04-02 ENCOUNTER — Encounter (HOSPITAL_BASED_OUTPATIENT_CLINIC_OR_DEPARTMENT_OTHER): Payer: Self-pay | Admitting: Emergency Medicine

## 2013-04-02 ENCOUNTER — Emergency Department (HOSPITAL_BASED_OUTPATIENT_CLINIC_OR_DEPARTMENT_OTHER)
Admission: EM | Admit: 2013-04-02 | Discharge: 2013-04-02 | Disposition: A | Discharge status: Home / Self Care | Payer: Self-pay | Attending: Emergency Medicine | Admitting: Emergency Medicine

## 2013-04-02 DIAGNOSIS — Y929 Unspecified place or not applicable: Secondary | ICD-10-CM | POA: Insufficient documentation

## 2013-04-02 DIAGNOSIS — S8990XA Unspecified injury of unspecified lower leg, initial encounter: Secondary | ICD-10-CM | POA: Insufficient documentation

## 2013-04-02 DIAGNOSIS — Z79899 Other long term (current) drug therapy: Secondary | ICD-10-CM | POA: Insufficient documentation

## 2013-04-02 DIAGNOSIS — M25562 Pain in left knee: Secondary | ICD-10-CM

## 2013-04-02 DIAGNOSIS — R296 Repeated falls: Secondary | ICD-10-CM | POA: Insufficient documentation

## 2013-04-02 DIAGNOSIS — Y939 Activity, unspecified: Secondary | ICD-10-CM | POA: Insufficient documentation

## 2013-04-02 DIAGNOSIS — Z87891 Personal history of nicotine dependence: Secondary | ICD-10-CM | POA: Insufficient documentation

## 2013-04-02 DIAGNOSIS — Z8719 Personal history of other diseases of the digestive system: Secondary | ICD-10-CM | POA: Insufficient documentation

## 2013-04-02 MED ORDER — OXYCODONE-ACETAMINOPHEN 5-325 MG PO TABS: 1.0000 | ORAL_TABLET | Freq: Four times a day (QID) | ORAL | Status: DC | PRN

## 2013-04-02 MED ORDER — IBUPROFEN 800 MG PO TABS: 800.0000 mg | ORAL_TABLET | Freq: Three times a day (TID) | ORAL | Status: DC

## 2013-04-02 MED ORDER — OXYCODONE-ACETAMINOPHEN 5-325 MG PO TABS
2.0000 | ORAL_TABLET | Freq: Once | ORAL | Status: AC
  Administered 2013-04-02: 2 via ORAL
  Filled 2013-04-02: qty 2

## 2013-04-02 NOTE — ED Notes (Signed)
MD at bedside. 

## 2013-04-02 NOTE — ED Notes (Signed)
Left knee pain x 2 days

## 2013-04-02 NOTE — ED Provider Notes (Signed)
CSN: [id number]     Arrival date & time 04/02/13  1855 History   First MD Initiated Contact with Patient 04/02/13 2117     Chief Complaint  Patient presents with  . Knee Pain   (Consider location/radiation/quality/duration/timing/severity/associated sxs/prior Treatment) HPI Pt presents with c/o pain in left knee.  Pt states he fell 3-4 days ago but did not initially have any knee pain.  [place], then yesterday began to have left knee pain.  Pain worse with movement and weight bearing.  Pain is located behind the patella.  He feels there may be some swelling associated.  No fever/chills, no warmth or overlying redness of the knee.  Has been taking ibuprofen and tramadol for pain today with minimal relief.  There are no other associated systemic symptoms, there are no other alleviating or modifying factors.   Past Medical History  Diagnosis Date  . Rectal fistula 2009  . Chills with fever   . Rectal pain    Past Surgical History  Procedure Laterality Date  . Finger surgery  2008/09?    index finger cut off    No family history on file. History  Substance Use Topics  . Smoking status: Former Smoker    Quit date: 03/01/2011  . Smokeless tobacco: Never Used  . Alcohol Use: No    Review of Systems ROS reviewed and all otherwise negative except for mentioned in HPI  Allergies  Review of patient's allergies indicates no known allergies.  Home Medications   Current Outpatient Rx  Name  Route  Sig  Dispense  Refill  . albuterol (VENTOLIN HFA) 108 (90 BASE) MCG/ACT inhaler   Inhalation   Inhale 2 puffs into the lungs every 6 (six) hours as needed for wheezing.   1 Inhaler   0     OV required for any further refiils   . ibuprofen (ADVIL,MOTRIN) 800 MG tablet   Oral   Take 1 tablet (800 mg total) by mouth 3 (three) times daily.   21 tablet   0   . oxyCODONE-acetaminophen (PERCOCET/ROXICET) 5-325 MG per tablet   Oral   Take 1-2  tablets by mouth every 6 (six) hours as needed for pain.   30 tablet   0    BP 145/91  Pulse 106  Temp(Src) 98.9 F (37.2 C) (Oral)  Resp 20  Ht 6\' 2"  (1.88 m)  Wt 400 lb (181.439 kg)  BMI 51.34 kg/m2  SpO2 98% Vitals reviewed Physical Exam Physical Examination: General appearance - alert, well appearing, and in no distress Mental status - alert, oriented to person, place, and time Eyes - no scleral icterus, no conjunctival injection Chest - clear to auscultation, no wheezes, rales or rhonchi, symmetric air entry Heart - normal rate, regular rhythm, normal S1, S2, no murmurs, rubs, clicks or gallops Neurological - alert, oriented, normal speech, no focal findings or movement disorder noted Musculoskeletal - left knee with ttp over patella and lateral and medial joint lines, mild effusion noted, some pain with flexion, negative anterior drawer sign, distally lower extremity NVI Extremities - peripheral pulses normal, no pedal edema, no clubbing or cyanosis Skin - normal coloration and turgor, no rashes  ED Course  Procedures (including critical care time) Labs Review Labs Reviewed - No data to display Imaging Review Dg Knee Complete 4 Views Left  04/02/2013   CLINICAL DATA:  Left knee pain, fell 3 days ago  EXAM: LEFT KNEE - COMPLETE  4+ VIEW  COMPARISON:  None  FINDINGS: Question slight osseous demineralization.  Joint spaces preserved.  No definite acute fracture, dislocation or bone destruction.  Mild regional soft tissue swelling.  Small spur at caudal margin of patella articular surface.  Suspect knee joint effusion.  IMPRESSION: Suspect small knee joint effusion.  No definite acute fracture or dislocation identified.   Electronically Signed   By: Ulyses Southward M.D.   On: 04/02/2013 19:35    EKG Interpretation   None       MDM   1. Knee pain, acute, left    Pt presenting with c/o left knee pain- xray reassuring- xray images reviewed and interpreted by me as well.   Suspect ligamentous/meniscal injury versus arthritis.  Pt placed in knee immobilizer and he has crutches already.  Given percocet.  Advised nonweight bearing, given information for ortho followup.  Pt has been seen by Dr. Thurston Hole in the past.  Discharged with strict return precautions.  Pt agreeable with plan.    Ethelda Chick, MD 04/02/13 [phone]

## 2016-06-03 ENCOUNTER — Ambulatory Visit
Admission: RE | Admit: 2016-06-03 | Discharge: 2016-06-03 | Disposition: A | Discharge status: Home / Self Care | Payer: Self-pay | Source: Ambulatory Visit | Attending: Family Medicine | Admitting: Family Medicine

## 2016-06-03 ENCOUNTER — Other Ambulatory Visit: Payer: Self-pay | Admitting: Family Medicine

## 2016-06-03 DIAGNOSIS — M79604 Pain in right leg: Secondary | ICD-10-CM

## 2017-03-07 ENCOUNTER — Telehealth: Payer: Self-pay | Admitting: Internal Medicine

## 2017-03-07 NOTE — Telephone Encounter (Signed)
Ok but please let pt know that I do not prescribed schedule II or higher narcotics for chronic pain as part of my practice  If he needs this, he should look elsewhere as I am not able to do this

## 2017-03-07 NOTE — Telephone Encounter (Signed)
Patient states he use to be a patient of Dr. Jonny Ruiz.  He would like to know if Dr. Jonny Ruiz would take him back on as a patient? Note to schedulers:  Patient is self pay wanting to schedule a CPE.

## 2017-03-07 NOTE — Telephone Encounter (Signed)
Got patient scheduled

## 2017-03-21 ENCOUNTER — Ambulatory Visit (INDEPENDENT_AMBULATORY_CARE_PROVIDER_SITE_OTHER): Payer: Self-pay | Admitting: Internal Medicine

## 2017-03-21 ENCOUNTER — Other Ambulatory Visit (INDEPENDENT_AMBULATORY_CARE_PROVIDER_SITE_OTHER): Payer: Self-pay

## 2017-03-21 ENCOUNTER — Telehealth: Payer: Self-pay | Admitting: Internal Medicine

## 2017-03-21 ENCOUNTER — Encounter: Payer: Self-pay | Admitting: Internal Medicine

## 2017-03-21 ENCOUNTER — Other Ambulatory Visit: Payer: Self-pay | Admitting: Internal Medicine

## 2017-03-21 VITALS — BP 146/92 | HR 87 | Temp 98.6°F | Ht 74.0 in | Wt >= 6400 oz

## 2017-03-21 DIAGNOSIS — M545 Low back pain, unspecified: Secondary | ICD-10-CM | POA: Insufficient documentation

## 2017-03-21 DIAGNOSIS — Z87898 Personal history of other specified conditions: Secondary | ICD-10-CM | POA: Insufficient documentation

## 2017-03-21 DIAGNOSIS — I10 Essential (primary) hypertension: Secondary | ICD-10-CM

## 2017-03-21 DIAGNOSIS — Z0000 Encounter for general adult medical examination without abnormal findings: Secondary | ICD-10-CM

## 2017-03-21 DIAGNOSIS — F1491 Cocaine use, unspecified, in remission: Secondary | ICD-10-CM | POA: Insufficient documentation

## 2017-03-21 DIAGNOSIS — G4733 Obstructive sleep apnea (adult) (pediatric): Secondary | ICD-10-CM | POA: Insufficient documentation

## 2017-03-21 DIAGNOSIS — F17200 Nicotine dependence, unspecified, uncomplicated: Secondary | ICD-10-CM | POA: Insufficient documentation

## 2017-03-21 DIAGNOSIS — N200 Calculus of kidney: Secondary | ICD-10-CM | POA: Insufficient documentation

## 2017-03-21 DIAGNOSIS — R400 Somnolence: Secondary | ICD-10-CM

## 2017-03-21 DIAGNOSIS — G8929 Other chronic pain: Secondary | ICD-10-CM

## 2017-03-21 DIAGNOSIS — Z114 Encounter for screening for human immunodeficiency virus [HIV]: Secondary | ICD-10-CM

## 2017-03-21 DIAGNOSIS — R5383 Other fatigue: Secondary | ICD-10-CM

## 2017-03-21 DIAGNOSIS — Z0001 Encounter for general adult medical examination with abnormal findings: Secondary | ICD-10-CM

## 2017-03-21 DIAGNOSIS — J4520 Mild intermittent asthma, uncomplicated: Secondary | ICD-10-CM

## 2017-03-21 DIAGNOSIS — R21 Rash and other nonspecific skin eruption: Secondary | ICD-10-CM

## 2017-03-21 DIAGNOSIS — K4090 Unilateral inguinal hernia, without obstruction or gangrene, not specified as recurrent: Secondary | ICD-10-CM

## 2017-03-21 DIAGNOSIS — F1210 Cannabis abuse, uncomplicated: Secondary | ICD-10-CM | POA: Insufficient documentation

## 2017-03-21 LAB — TSH: TSH: 1.88 u[IU]/mL (ref 0.35–4.50)

## 2017-03-21 LAB — URINALYSIS, ROUTINE W REFLEX MICROSCOPIC
BILIRUBIN URINE: NEGATIVE
HGB URINE DIPSTICK: NEGATIVE
Ketones, ur: NEGATIVE
LEUKOCYTES UA: NEGATIVE
Nitrite: NEGATIVE
RBC / HPF: NONE SEEN (ref 0–?)
Specific Gravity, Urine: 1.02 (ref 1.000–1.030)
TOTAL PROTEIN, URINE-UPE24: NEGATIVE
Urine Glucose: NEGATIVE
Urobilinogen, UA: 0.2 (ref 0.0–1.0)
pH: 6.5 (ref 5.0–8.0)

## 2017-03-21 LAB — BASIC METABOLIC PANEL
BUN: 15 mg/dL (ref 6–23)
CALCIUM: 9.5 mg/dL (ref 8.4–10.5)
CO2: 27 mEq/L (ref 19–32)
Chloride: 101 mEq/L (ref 96–112)
Creatinine, Ser: 0.54 mg/dL (ref 0.40–1.50)
GFR: 174.88 mL/min (ref >60.00)
GLUCOSE: 131 mg/dL — AB (ref 70–99)
POTASSIUM: 4.3 meq/L (ref 3.5–5.1)
SODIUM: 137 meq/L (ref 135–145)

## 2017-03-21 LAB — LIPID PANEL
CHOL/HDL RATIO: 4
CHOLESTEROL: 188 mg/dL (ref 0–200)
HDL: 46.2 mg/dL (ref >39.00)
LDL CALC: 113 mg/dL — AB (ref 0–99)
NonHDL: 141.63
Triglycerides: 141 mg/dL (ref 0.0–149.0)
VLDL: 28.2 mg/dL (ref 0.0–40.0)

## 2017-03-21 LAB — HEPATIC FUNCTION PANEL
ALBUMIN: 4.2 g/dL (ref 3.5–5.2)
ALK PHOS: 78 U/L (ref 39–117)
ALT: 19 U/L (ref 0–53)
AST: 17 U/L (ref 0–37)
BILIRUBIN TOTAL: 0.6 mg/dL (ref 0.2–1.2)
Bilirubin, Direct: 0.1 mg/dL (ref 0.0–0.3)
Total Protein: 6.6 g/dL (ref 6.0–8.3)

## 2017-03-21 LAB — CBC WITH DIFFERENTIAL/PLATELET
BASOS PCT: 0.4 % (ref 0.0–3.0)
Basophils Absolute: 0 10*3/uL (ref 0.0–0.1)
EOS PCT: 1.3 % (ref 0.0–5.0)
Eosinophils Absolute: 0.1 10*3/uL (ref 0.0–0.7)
HCT: 42.6 % (ref 39.0–52.0)
HEMOGLOBIN: 14.6 g/dL (ref 13.0–17.0)
Lymphocytes Relative: 26.2 % (ref 12.0–46.0)
Lymphs Abs: 2.5 10*3/uL (ref 0.7–4.0)
MCHC: 34.3 g/dL (ref 30.0–36.0)
MCV: 95.2 fl (ref 78.0–100.0)
MONOS PCT: 6.9 % (ref 3.0–12.0)
Monocytes Absolute: 0.6 10*3/uL (ref 0.1–1.0)
Neutro Abs: 6.1 10*3/uL (ref 1.4–7.7)
Neutrophils Relative %: 65.2 % (ref 43.0–77.0)
Platelets: 183 10*3/uL (ref 150.0–400.0)
RBC: 4.47 Mil/uL (ref 4.22–5.81)
RDW: 13.9 % (ref 11.5–15.5)
WBC: 9.4 10*3/uL (ref 4.0–10.5)

## 2017-03-21 LAB — PSA: PSA: 0.16 ng/mL (ref 0.10–4.00)

## 2017-03-21 LAB — TESTOSTERONE: TESTOSTERONE: 59.62 ng/dL — AB (ref 300.00–890.00)

## 2017-03-21 MED ORDER — VARENICLINE TARTRATE 0.5 MG X 11 & 1 MG X 42 PO MISC: ORAL | 0 refills | Status: DC

## 2017-03-21 MED ORDER — NAPROXEN 500 MG PO TABS: 500.0000 mg | ORAL_TABLET | Freq: Two times a day (BID) | ORAL | 2 refills | Status: DC

## 2017-03-21 MED ORDER — NALTREXONE-BUPROPION HCL ER 8-90 MG PO TB12: ORAL_TABLET | ORAL | 0 refills | Status: DC

## 2017-03-21 MED ORDER — NYSTATIN 100000 UNIT/GM EX POWD: CUTANEOUS | 0 refills | Status: DC

## 2017-03-21 MED ORDER — ALBUTEROL SULFATE HFA 108 (90 BASE) MCG/ACT IN AERS: 2.0000 | INHALATION_SPRAY | Freq: Four times a day (QID) | RESPIRATORY_TRACT | 11 refills | Status: DC | PRN

## 2017-03-21 MED ORDER — AMLODIPINE BESYLATE 5 MG PO TABS
5.0000 mg | ORAL_TABLET | Freq: Every day | ORAL | 3 refills | Status: DC
Start: 2017-03-21 — End: 2017-10-25

## 2017-03-21 MED ORDER — BUPROPION HCL ER (XL) 150 MG PO TB24: 150.0000 mg | ORAL_TABLET | Freq: Every day | ORAL | 3 refills | Status: DC

## 2017-03-21 MED ORDER — LORCASERIN HCL 10 MG PO TABS: ORAL_TABLET | ORAL | 5 refills | Status: DC

## 2017-03-21 MED ORDER — NALTREXONE-BUPROPION HCL ER 8-90 MG PO TB12: ORAL_TABLET | ORAL | 5 refills | Status: DC

## 2017-03-21 MED ORDER — VARENICLINE TARTRATE 1 MG PO TABS: 1.0000 mg | ORAL_TABLET | Freq: Two times a day (BID) | ORAL | 1 refills | Status: DC

## 2017-03-21 MED ORDER — TRIAMCINOLONE ACETONIDE 0.1 % EX CREA: 1.0000 "application " | TOPICAL_CREAM | Freq: Two times a day (BID) | CUTANEOUS | 0 refills | Status: AC

## 2017-03-21 NOTE — Assessment & Plan Note (Signed)
2 types, one not present today but seems likely atopic dermatitis related - for triam cr prn, also scrotal rash likely fungal with itching - for nystatin powder

## 2017-03-21 NOTE — Assessment & Plan Note (Addendum)
D/w pt I do not tx chronic pain with narcotic, ok for nsaid prn, d/w pt option to see sports medicine in this office for further evaluation

## 2017-03-21 NOTE — Assessment & Plan Note (Signed)

## 2017-03-21 NOTE — Assessment & Plan Note (Addendum)
High suspicion for OSA based on hx and body habitus - for pulm referral  In addition to the time spent performing CPE, I spent an additional 40 minutes face to face,in which greater than 50% of this time was spent in counseling and coordination of care for patient's acute illness as documented, including probable sleep apnea, HTN, left inguinal hernia, allergic and fungal rash, fatigue with low libido,morbid obesity, asthma and quitting smoking

## 2017-03-21 NOTE — Assessment & Plan Note (Signed)
New onset, may be aggrevated by possible sleep apnea, for amlodipine 5 qd

## 2017-03-21 NOTE — Assessment & Plan Note (Signed)
Etiology unclear, but with low sex drive also for testosterone level

## 2017-03-21 NOTE — Patient Instructions (Addendum)
You will be contacted regarding the referral for: pulmonary   Please take all new medication as prescribed - the blood pressure medication, and Chantix, steroid cream, anti-inflammatory medication for pain, and Belviq for weight loss  Ok to use the Nicotine OTC gum or patch with the chantix  Please call if you change your mind about the referral to General Surgury for the left inguinal hernia.  Please consider making an appointment with our Sports Medicine providers for your pain  Please continue all other medications as before, and refills have been done if requested.  Please have the pharmacy call with any other refills you may need.  Please continue your efforts at being more active, low cholesterol diet, and weight control.  You are otherwise up to date with prevention measures today.  Please keep your appointments with your specialists as you may have planned  Please go to the LAB in the Basement (turn left off the elevator) for the tests to be done today  You will be contacted by phone if any changes need to be made immediately.  Otherwise, you will receive a letter about your results with an explanation, but please check with MyChart first.  Please remember to sign up for MyChart if you have not done so, as this will be important to you in the future with finding out test results, communicating by private email, and scheduling acute appointments online when needed.  Please return in 3 months, or sooner if needed

## 2017-03-21 NOTE — Telephone Encounter (Signed)
belviq and chantix changes to contrave and wellbutrin  naproxyn changed to #100   Nystatin is already 30 gm quantity  I cannot change the steroid to tablet

## 2017-03-21 NOTE — Telephone Encounter (Signed)
Patient would like to know if naproxyn can be written for 100 tablets b/c it is cheaper out of pocket for him  Would like triamcinolone to be changed to 80 tablets " Would like nystatin changed to 30 grams  " Patient uses [facility] in [place].

## 2017-03-21 NOTE — Assessment & Plan Note (Signed)
Mild, reducible, NT, pt declines general surgury referral

## 2017-03-21 NOTE — Assessment & Plan Note (Signed)
D/w pt, for chantix asd,  to f/u any worsening symptoms or concerns  

## 2017-03-21 NOTE — Assessment & Plan Note (Signed)
Mild intermittent uncomplicated - for refill albuterol HFA PRN USE

## 2017-03-21 NOTE — Progress Notes (Signed)
Subjective:    Patient ID: Larry Fowler, male    DOB: [birth year], 46 y.o.   MRN: [id number]  HPI  Here for wellness and "20 year followup";  Overall doing ok;  Pt denies Chest pain, worsening SOB, DOE, wheezing, orthopnea, PND, worsening LE edema, palpitations, dizziness or syncope., though does need albuterol inhaler refill as this does help.  Pt denies neurological change such as new headache, facial or extremity weakness.  Pt denies polydipsia, polyuria, or low sugar symptoms. Pt states overall good compliance with treatment and medications, good tolerability, and has been trying to follow appropriate diet.  Pt denies worsening depressive symptoms, suicidal ideation or panic. No fever, night sweats, wt loss, loss of appetite, or other constitutional symptoms.  Pt states good ability with ADL's, has low fall risk, home safety reviewed and adequate, no other significant changes in hearing or vision, and not active with exercise, due to fatigue.  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  Quit smoking aobut 4 yrs ago, picked it up again about 1 -2 yrs ago, now wants to quit.  He is ok with chantix rx.  Mentions not much exercise due to fatigue and pain.  Does swim in backyard pool when he can.  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  Lost 62 lbs with stopping sweet tea and sodas.  Does c/o ongoing fatigue, but also has signficant daytime hypersomnolence, snores at night.   Also has very low sex drive, as well as occasional itchy rash maybe related to his dog allergy and groomer job.  Also has chronic back, knees and bilateral shoulder pain, and very low sex drive Wt Readings from Last 3 Encounters:  03/21/17 (!) 491 lb (222.7 kg)  04/02/13 (!) 400 lb (181.4 kg)  04/05/11 (!) 451 lb (204.6 kg)   Past Medical History:  Diagnosis Date  . ALLERGIC RHINITIS 09/12/2007   Qualifier: Diagnosis of  By: Jonny Ruiz MD, Len Blalock   . ANXIETY 02/16/2007   Qualifier: Diagnosis of  By: Tyrone Apple, Lucy    . ASTHMA 12/17/2009   Qualifier: Diagnosis of  By: Jonny Ruiz MD, Len Blalock   . Cannabis abuse    currently  . Chills with fever   . DEPRESSION 09/12/2007   Qualifier: Diagnosis of  By: Jonny Ruiz MD, Len Blalock   . History of cocaine use    in his 57's  . Rectal fistula 2009  . Rectal pain   . Renal stone    2008   Past Surgical History:  Procedure Laterality Date  . FINGER SURGERY  2008/09?   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 knee surgury     ? which knee per Dr Thurston Hole in his teens  . TONSILLECTOMY      reports that he has been smoking.  He has never used smokeless tobacco. He reports that he drinks alcohol. He reports that he uses drugs, including Marijuana. family history includes Diabetes type II in his mother; Pneumonia in his father. No Known Allergies Current Outpatient Prescriptions on File Prior to Visit  Medication Sig Dispense Refill  . ibuprofen (ADVIL,MOTRIN) 800 MG tablet Take 1 tablet (800 mg total) by mouth 3 (three) times daily. 21 tablet 0   No current facility-administered medications on file prior to visit.    Review of Systems  Constitutional: Negative for other unusual diaphoresis  or sweats HENT: Negative for ear discharge or swelling Eyes: Negative for other worsening visual disturbances Respiratory: Negative for stridor or other swelling  Gastrointestinal: Negative for worsening distension or other blood Genitourinary: Negative for retention or other urinary change Musculoskeletal: Negative for other MSK pain or swelling Skin: Negative for color change or other new lesions Neurological: Negative for worsening tremors and other numbness  Psychiatric/Behavioral: Negative for worsening agitation or other fatigue    Objective:   Physical Exam BP (!) 146/92   Pulse 87   Temp 98.6 F (37 C) (Oral)   Ht  (1.88 m)   Wt (!) 491 lb (222.7 kg)   SpO2 99%    BMI 63.04 kg/m  VS noted, supermorbid obese Constitutional: Pt is oriented to person, place, and time. Appears well-developed and well-nourished, in no significant distress and comfortable Head: Normocephalic and atraumatic  Eyes: Conjunctivae and EOM are normal. Pupils are equal, round, and reactive to light Right Ear: External ear normal without discharge Left Ear: External ear normal without discharge Nose: Nose without discharge or deformity Mouth/Throat: Oropharynx is without other ulcerations and moist  Neck: Normal range of motion. Neck supple. No JVD present. No tracheal deviation present or significant neck LA or mass Cardiovascular: Normal rate, regular rhythm, normal heart sounds and intact distal pulses.   Pulmonary/Chest: WOB normal and breath sounds without rales or wheezing  Abdominal: Soft. Bowel sounds are normal. NT. No HSM  Musculoskeletal: Normal range of motion. Exhibits trace to 1+ chronic bilat LE edema, spine nontender, bilat knees without effusion GU: + reducible nontender inguinal hernia, probably small but very difficult to say given the body habitus; scrotum with mild diffuse nontender erythema Lymphadenopathy: Has no other cervical adenopathy.  Neurological: Pt is alert and oriented to person, place, and time. Pt has normal reflexes. No cranial nerve deficit. Motor grossly intact, Gait intact Skin: Skin is warm and dry. No rash noted or new ulcerations Psychiatric:  Has normal mood and affect. Behavior is normal without agitation No other exam findings     Assessment & Plan:

## 2017-03-21 NOTE — Assessment & Plan Note (Signed)
D/w pt, will try belviq for wt loss if affordable,  to f/u any worsening symptoms or concerns

## 2017-03-22 ENCOUNTER — Other Ambulatory Visit (INDEPENDENT_AMBULATORY_CARE_PROVIDER_SITE_OTHER): Payer: Self-pay

## 2017-03-22 DIAGNOSIS — R739 Hyperglycemia, unspecified: Secondary | ICD-10-CM

## 2017-03-22 LAB — HEMOGLOBIN A1C: Hgb A1c MFr Bld: 6.7 % — ABNORMAL HIGH (ref 4.6–6.5)

## 2017-03-22 LAB — HIV ANTIBODY (ROUTINE TESTING W REFLEX): HIV: NONREACTIVE

## 2017-03-22 NOTE — Telephone Encounter (Signed)
Pt has been notified and expressed understanding.

## 2017-03-24 DIAGNOSIS — E119 Type 2 diabetes mellitus without complications: Secondary | ICD-10-CM | POA: Insufficient documentation

## 2017-04-04 ENCOUNTER — Other Ambulatory Visit: Payer: Self-pay | Admitting: Internal Medicine

## 2017-04-04 ENCOUNTER — Encounter: Payer: Self-pay | Admitting: Internal Medicine

## 2017-04-04 MED ORDER — BUPROPION HCL ER (SR) 150 MG PO TB12: 150.0000 mg | ORAL_TABLET | Freq: Two times a day (BID) | ORAL | 3 refills | Status: DC

## 2017-04-04 MED ORDER — NYSTATIN 100000 UNIT/GM EX POWD: CUTANEOUS | 0 refills | Status: DC

## 2017-05-04 ENCOUNTER — Other Ambulatory Visit (INDEPENDENT_AMBULATORY_CARE_PROVIDER_SITE_OTHER): Payer: Self-pay

## 2017-05-04 ENCOUNTER — Ambulatory Visit (INDEPENDENT_AMBULATORY_CARE_PROVIDER_SITE_OTHER): Payer: Self-pay | Admitting: Pulmonary Disease

## 2017-05-04 ENCOUNTER — Encounter: Payer: Self-pay | Admitting: Pulmonary Disease

## 2017-05-04 VITALS — BP 118/82 | HR 77 | Ht 74.0 in | Wt >= 6400 oz

## 2017-05-04 DIAGNOSIS — R400 Somnolence: Secondary | ICD-10-CM

## 2017-05-04 DIAGNOSIS — J45909 Unspecified asthma, uncomplicated: Secondary | ICD-10-CM

## 2017-05-04 DIAGNOSIS — F17200 Nicotine dependence, unspecified, uncomplicated: Secondary | ICD-10-CM

## 2017-05-04 LAB — CBC WITH DIFFERENTIAL/PLATELET
Basophils Absolute: 0 10*3/uL (ref 0.0–0.1)
Basophils Relative: 0.4 % (ref 0.0–3.0)
EOS ABS: 0.2 10*3/uL (ref 0.0–0.7)
EOS PCT: 2.1 % (ref 0.0–5.0)
HCT: 45 % (ref 39.0–52.0)
HEMOGLOBIN: 15 g/dL (ref 13.0–17.0)
LYMPHS ABS: 2.2 10*3/uL (ref 0.7–4.0)
Lymphocytes Relative: 25.4 % (ref 12.0–46.0)
MCHC: 33.3 g/dL (ref 30.0–36.0)
MCV: 95.1 fl (ref 78.0–100.0)
MONO ABS: 0.6 10*3/uL (ref 0.1–1.0)
Monocytes Relative: 6.4 % (ref 3.0–12.0)
NEUTROS PCT: 65.7 % (ref 43.0–77.0)
Neutro Abs: 5.7 10*3/uL (ref 1.4–7.7)
Platelets: 194 10*3/uL (ref 150.0–400.0)
RBC: 4.73 Mil/uL (ref 4.22–5.81)
RDW: 14.2 % (ref 11.5–15.5)
WBC: 8.7 10*3/uL (ref 4.0–10.5)

## 2017-05-04 NOTE — Progress Notes (Signed)
Subjective:    Patient ID: Larry Fowler, male    DOB: [birth year], 46 y.o.   MRN: [id number]  HPI  Chief Complaint  Patient presents with  . Sleep Consult    Referred by Dr. Jonny RuizJohn for having problems falling asleep at night.     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 He has been working as a Research scientist (medical)dog groomer for 20 years and feels that it makes his breathing worse.  Asthma was diagnosed a few years ago and he uses albuterol as a rescue inhaler.  He has been using this up to 3 times daily.  He feels better on weekends when he is off. He has a history of substance abuse, quit cocaine many years ago, smokes marijuana about 2-3 times a week, smokes tobacco a pack lasting about 3 days-about 15 pack years.  His wife is noted loud snoring and he reports multiple nocturnal awakenings and non-refreshing sleep. Epworth sleepiness score is 8 and he reports sleepiness while sitting and reading or lying down to rest in the afternoons.  He is to take a nap after work for 2 hours every day. Bedtime is between midnight and 1 AM, sleep latency is minimal, he sleeps on his side with one pillow, reports 2-3 nocturnal awakenings including nocturia and is out of bed by 8 AM feeling tired with occasional dryness of mouth but denies a headache.  On weekends he stays in bed until 10 AM and feels refreshed. He has gained about 30 pounds in the last 2 years There is no history suggestive of cataplexy, sleep paralysis or parasomnias   He has recently been diagnosed with hypertension controlled on amlodipine. He has chronic back pain and takes naproxen daily  Spirometry showed ratio of 67, FEV1 of 48% and FVC of 56% suggesting moderate to severe airway obstruction  Past Medical History:  Diagnosis Date  . ALLERGIC RHINITIS 09/12/2007   Qualifier: Diagnosis of  By: Jonny RuizJohn MD, Len BlalockJames W   . ANXIETY 02/16/2007   Qualifier: Diagnosis of  By: Tyrone AppleBrand RMA,  Lucy    . ASTHMA 12/17/2009   Qualifier: Diagnosis of  By: Jonny RuizJohn MD, Len BlalockJames W   . Cannabis abuse    currently  . Chills with fever   . DEPRESSION 09/12/2007   Qualifier: Diagnosis of  By: Jonny RuizJohn MD, Len BlalockJames W   . History of cocaine use    in his 5420's  . Hyperglycemia   . Rectal fistula 2009  . Rectal pain   . Renal stone    2008   Past Surgical History:  Procedure Laterality Date  . FINGER SURGERY  2008/09?   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 knee surgury     ? which knee per Dr Thurston HoleWainer in his teens  . TONSILLECTOMY      No Known Allergies   Social History   Socioeconomic History  . Marital status: Single    Spouse name: Not on file  . Number of children: Not on file  . Years of education: Not on file  . Highest education level: Not on file  Social Needs  . Financial resource strain: Not on file  . Food insecurity - worry: Not on file  . Food insecurity - inability: Not on file  . Transportation needs - medical: Not on file  . Transportation needs - non-medical: Not on file  Occupational History  .  Not on file  Tobacco Use  . Smoking status: Current Every Day Smoker    Last attempt to quit: 03/01/2011    Years since quitting: 6.1  . Smokeless tobacco: Never Used  Substance and Sexual Activity  . Alcohol use: Yes    Alcohol/week: 0.0 oz  . Drug use: Yes    Types: Marijuana  . Sexual activity: Not on file  Other Topics Concern  . Not on file  Social History Narrative  . Not on file    Family History  Problem Relation Age of Onset  . Diabetes type II Mother   . Pneumonia Father      Review of Systems Positive for weight gain, shortness of breath and chronic back pain  Constitutional: negative for anorexia, fevers and sweats  Eyes: negative for irritation, redness and visual disturbance  Ears, nose, mouth, throat, and face: negative for earaches, epistaxis, nasal congestion and sore throat    Respiratory: negative for cough,  sputum and wheezing  Cardiovascular: negative for chest pain, dyspnea, lower extremity edema, orthopnea, palpitations and syncope  Gastrointestinal: negative for abdominal pain, constipation, diarrhea, melena, nausea and vomiting  Genitourinary:negative for dysuria, frequency and hematuria  Hematologic/lymphatic: negative for bleeding, easy bruising and lymphadenopathy  Musculoskeletal:negative for arthralgias, muscle weakness and stiff joints  Neurological: negative for coordination problems, gait problems, headaches and weakness  Endocrine: negative for diabetic symptoms including polydipsia, polyuria and weight loss     Objective:   Physical Exam  Gen. Pleasant, obese, in no distress, normal affect ENT -  no post nasal drip, class 2-3 airway Neck: No JVD, no thyromegaly, no carotid bruits Lungs: no use of accessory muscles, no dullness to percussion, decreased without rales or rhonchi  Cardiovascular: Rhythm regular, heart sounds  normal, no murmurs or gallops, no peripheral edema Abdomen: soft and non-tender, no hepatosplenomegaly, BS normal. Musculoskeletal: No deformities, no cyanosis or clubbing Neuro:  alert, non focal, no tremors       Assessment & Plan:

## 2017-05-04 NOTE — Assessment & Plan Note (Signed)
Given excessive daytime somnolence, narrow pharyngeal exam, witnessed apneas & loud snoring, obstructive sleep apnea is very likely & an overnight polysomnogram will be scheduled as a home study. The pathophysiology of obstructive sleep apnea , it's cardiovascular consequences & modes of treatment including CPAP were discused with the patient in detail & they evidenced understanding.  Pretest probability is high 

## 2017-05-04 NOTE — Patient Instructions (Addendum)
Home sleep study will be scheduled  Try nicotine patches and attempt to quit smoking  Blood work today Trial of symbicort 80 -2 puffs twice daily

## 2017-05-04 NOTE — Assessment & Plan Note (Signed)
Smoking cessation strongly emphasized. He will try nicotine patches 14 mg/day

## 2017-05-04 NOTE — Assessment & Plan Note (Addendum)
Check spirometry today.  His use of rescue albuterol seems to be high. We will check CBC with differential for eosinophilia and also RAST panel-if high then consider antiallergy medications otherwise and steroids/LABA combination  Addendum-spirometry showed severe airway obstruction, given sample of Symbicort 80 to try, discussed side effects

## 2017-05-05 LAB — RESPIRATORY ALLERGY PROFILE REGION II ~~LOC~~
Allergen, Cedar tree, t12: <0.1 kU/L
Allergen, D pternoyssinus,d7: <0.1 kU/L
Allergen, Mouse Urine Protein, e78: <0.1 kU/L
Allergen, Mulberry, t76: <0.1 kU/L
Allergen, Oak,t7: <0.1 kU/L
Allergen, P. notatum, m1: <0.1 kU/L
Aspergillus fumigatus, m3: <0.1 kU/L
Box Elder IgE: <0.1 kU/L
CLASS: 0
CLASS: 0
CLASS: 0
CLASS: 0
CLASS: 0
CLASS: 0
CLASS: 0
CLASS: 0
CLASS: 0
CLASS: 0
CLASS: 0
CLASS: 0
CLASS: 0
CLASS: 0
CLASS: 0
CLASS: 0
Class: 0
Class: 0
Class: 0
Class: 0
Class: 0
Class: 0
Class: 0
Class: 0
Cockroach: <0.1 kU/L
D. farinae: <0.1 kU/L
Elm IgE: <0.1 kU/L
IGE (IMMUNOGLOBULIN E), SERUM: 46 kU/L (ref <=114)
Rough Pigweed  IgE: <0.1 kU/L
Timothy Grass: <0.1 kU/L

## 2017-05-05 LAB — INTERPRETATION:

## 2017-05-12 ENCOUNTER — Telehealth: Payer: Self-pay | Admitting: Pulmonary Disease

## 2017-05-12 DIAGNOSIS — G4733 Obstructive sleep apnea (adult) (pediatric): Secondary | ICD-10-CM

## 2017-05-12 NOTE — Telephone Encounter (Signed)
Spoke with pt letting him know the results of the HST he had done. Per RA, pt has moderate OSA averaging 21 apneas per hour.  Pt expressed understanding.  Scheduled pt to have a CPAP titration study done.  Nothing further needed.

## 2017-05-13 ENCOUNTER — Other Ambulatory Visit: Payer: Self-pay | Admitting: *Deleted

## 2017-05-13 DIAGNOSIS — G4733 Obstructive sleep apnea (adult) (pediatric): Secondary | ICD-10-CM

## 2017-05-13 DIAGNOSIS — R400 Somnolence: Secondary | ICD-10-CM

## 2017-06-14 ENCOUNTER — Encounter (HOSPITAL_BASED_OUTPATIENT_CLINIC_OR_DEPARTMENT_OTHER): Payer: Self-pay

## 2017-06-15 ENCOUNTER — Encounter: Payer: Self-pay | Admitting: Internal Medicine

## 2017-06-15 NOTE — Telephone Encounter (Signed)
shirron to help with above pt concern -

## 2017-06-16 ENCOUNTER — Encounter: Payer: Self-pay | Admitting: Family

## 2017-06-16 ENCOUNTER — Other Ambulatory Visit: Payer: Self-pay | Admitting: Family

## 2017-06-16 ENCOUNTER — Ambulatory Visit (INDEPENDENT_AMBULATORY_CARE_PROVIDER_SITE_OTHER): Payer: Self-pay | Admitting: Family

## 2017-06-16 VITALS — BP 124/78 | HR 71 | Temp 98.3°F | Ht 74.0 in | Wt >= 6400 oz

## 2017-06-16 DIAGNOSIS — H60501 Unspecified acute noninfective otitis externa, right ear: Secondary | ICD-10-CM

## 2017-06-16 MED ORDER — NEOMYCIN-POLYMYXIN-HC 1 % OT SOLN: 3.0000 [drp] | OTIC | 0 refills | Status: DC

## 2017-06-16 MED ORDER — PREDNISONE 10 MG (21) PO TBPK: ORAL_TABLET | ORAL | 0 refills | Status: DC

## 2017-06-16 MED ORDER — AMOXICILLIN-POT CLAVULANATE 875-125 MG PO TABS: 1.0000 | ORAL_TABLET | Freq: Two times a day (BID) | ORAL | 0 refills | Status: DC

## 2017-06-16 NOTE — Progress Notes (Signed)
Larry Fowler is a 47 y.o. male with the following history as recorded in EpicCare:  Patient Active Problem List   Diagnosis Date Noted  . Hyperglycemia   . Encounter for well adult exam with abnormal findings 03/21/2017  . Morbid obesity (HCC) 03/21/2017  . Daytime sleepiness 03/21/2017  . Chronic pain 03/21/2017  . Smoker 03/21/2017  . HTN (hypertension) 03/21/2017  . Fatigue 03/21/2017  . Left inguinal hernia 03/21/2017  . Rash 03/21/2017  . History of cocaine use   . Cannabis abuse   . Renal stone   . Anal abscess, ? fistula 04/05/2011  . Asthma 12/17/2009  . 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 DEPRESSION 09/12/2007  . ALLERGIC RHINITIS 09/12/2007  . ANXIETY 02/16/2007    Current Outpatient Medications  Medication Sig Dispense Refill  . budesonide-formoterol (SYMBICORT) 80-4.5 MCG/ACT inhaler Inhale 2 puffs into the lungs 2 (two) times daily.    Marland Kitchen. albuterol (VENTOLIN HFA) 108 (90 Base) MCG/ACT inhaler Inhale 2 puffs into the lungs every 6 (six) hours as needed for wheezing. 1 Inhaler 11  . amLODipine (NORVASC) 5 MG tablet Take 1 tablet (5 mg total) by mouth daily. 90 tablet 3  . amoxicillin-clavulanate (AUGMENTIN) 875-125 MG tablet Take 1 tablet by mouth 2 (two) times daily. 20 tablet 0  . naproxen (NAPROSYN) 500 MG tablet Take 1 tablet (500 mg total) by mouth 2 (two) times daily with a meal. 100 tablet 2  . NEOMYCIN-POLYMYXIN-HYDROCORTISONE (CORTISPORIN) 1 % SOLN OTIC solution Place 3 drops into the right ear every 4 (four) hours. 10 mL 0  . nystatin (MYCOSTATIN/NYSTOP) powder Use as directed daily as needed 60 g 0  . predniSONE (STERAPRED UNI-PAK 21 TAB) 10 MG (21) TBPK tablet Taper as directed 21 tablet 0  . triamcinolone cream (KENALOG) 0.1 % Apply 1 application topically 2 (two) times daily. 30 g 0   No current facility-administered medications for this visit.     Allergies: Patient has no known allergies.  Past Medical History:  Diagnosis Date  .  ALLERGIC RHINITIS 09/12/2007   Qualifier: Diagnosis of  By: Jonny RuizJohn MD, Len BlalockJames W   . ANXIETY 02/16/2007   Qualifier: Diagnosis of  By: Tyrone AppleBrand RMA, Lucy    . ASTHMA 12/17/2009   Qualifier: Diagnosis of  By: Jonny RuizJohn MD, Len BlalockJames W   . Cannabis abuse    currently  . Chills with fever   . DEPRESSION 09/12/2007   Qualifier: Diagnosis of  By: Jonny RuizJohn MD, Len BlalockJames W   . History of cocaine use    in his 1220's  . Hyperglycemia   . Rectal fistula 2009  . Rectal pain   . Renal stone    2008    Past Surgical History:  Procedure Laterality Date  . FINGER SURGERY  2008/09?   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 knee surgury     ? which knee per Dr Thurston HoleWainer in his teens  . TONSILLECTOMY      Family History  Problem Relation Age of Onset  . Diabetes type II Mother   . Pneumonia Father     Social History   Tobacco Use  . Smoking status: Current Every Day Smoker    Last attempt to quit: 03/01/2011    Years since quitting: 6.2  . Smokeless tobacco: Never Used  Substance Use Topics  . Alcohol use: Yes    Alcohol/week: 0.0 oz    Subjective:  Patient presents with  concerns for right ear pain x 2-3 days; "hurts to even touch my outer ear." Feels that pain is radiating down into upper throat; No fever; chronic sinus pain;    Objective:  Vitals:   06/16/17 1433  BP: 124/78  Pulse: 71  Temp: 98.3 F (36.8 C)  TempSrc: Oral  SpO2: 98%  Weight: (!) 481 lb (218.2 kg)  Height: 6\' 2"  (1.88 m)    General: Well developed, well nourished, in no acute distress  Skin : Warm and dry.  Head: Normocephalic and atraumatic  Eyes: Sclera and conjunctiva clear; pupils round and reactive to light; extraocular movements intact  Ears: External normal; right canals erythe; tympanic membranes normal  Oropharynx: Pink, supple. No suspicious lesions  Neck: Supple without thyromegaly, adenopathy  Lungs: Respirations unlabored; clear to auscultation bilaterally without  wheeze, rales, rhonchi  CVS exam: normal rate and regular rhythm.  Neurologic: Alert and oriented; speech intact; face symmetrical; moves all extremities well; CNII-XII intact without focal deficit  Assessment:  No diagnosis found.  Plan:  Rx for Augmentin 875 mg bid x 10 days, prednisone taper pack; Rx for Cortisporin Otic sent to pharmacy- he will hold this if too expensive; follow-up worse, no better.   No Follow-up on file.  No orders of the defined types were placed in this encounter.   Requested Prescriptions   Signed Prescriptions Disp Refills  . amoxicillin-clavulanate (AUGMENTIN) 875-125 MG tablet 20 tablet 0    Sig: Take 1 tablet by mouth 2 (two) times daily.  . predniSONE (STERAPRED UNI-PAK 21 TAB) 10 MG (21) TBPK tablet 21 tablet 0    Sig: Taper as directed  . NEOMYCIN-POLYMYXIN-HYDROCORTISONE (CORTISPORIN) 1 % SOLN OTIC solution 10 mL 0    Sig: Place 3 drops into the right ear every 4 (four) hours.

## 2017-06-16 NOTE — Telephone Encounter (Signed)
Copied from CRM [id number]. Topic: Inquiry >> Jun 16, 2017  3:39 PM Terisa Starraylor, Brittany L wrote: Patient is requesting a note for work from his office visit today with Vernona RiegerLaura. He needs it for this whole week. He wants to know if he can have it put into his mychart or emailed to him @ [email or web address]

## 2017-06-27 ENCOUNTER — Encounter: Payer: Self-pay | Admitting: Family

## 2017-06-28 ENCOUNTER — Other Ambulatory Visit: Payer: Self-pay | Admitting: Family

## 2017-06-28 MED ORDER — FLUTICASONE PROPIONATE 50 MCG/ACT NA SUSP: 2.0000 | Freq: Every day | NASAL | 1 refills | Status: DC

## 2017-06-30 ENCOUNTER — Encounter: Payer: Self-pay | Admitting: Internal Medicine

## 2017-07-01 ENCOUNTER — Telehealth: Payer: Self-pay | Admitting: Internal Medicine

## 2017-07-01 MED ORDER — TESTOSTERONE 50 MG/5GM (1%) TD GEL: 5.0000 g | Freq: Every day | TRANSDERMAL | 1 refills | Status: DC

## 2017-07-01 NOTE — Telephone Encounter (Signed)
Copied from CRM [id number]. Topic: Quick Communication - See Telephone Encounter >> Jul 01, 2017  4:05 PM Clack, Princella PellegriniJessica D wrote: CRM for notification. See Telephone encounter for: [facility] stated the pt medication for  testosterone (ANDROGEL) 50 MG/5GM (1%) GEL [[id number]][[id number]] was sent over as Androgel but needs to be sent over as compound or the cream.  Please f/u with pharmacy.  07/01/17.

## 2017-07-01 NOTE — Telephone Encounter (Signed)
Done hardcopy to [employer] Hughes Incorporated[facility]irron for faxing

## 2017-07-04 ENCOUNTER — Ambulatory Visit: Payer: Self-pay | Admitting: Pulmonary Disease

## 2017-07-04 ENCOUNTER — Other Ambulatory Visit: Payer: Self-pay | Admitting: Internal Medicine

## 2017-07-04 MED ORDER — TESTOSTERONE 20 % CREA: 1.2500 g | TOPICAL_CREAM | Freq: Every day | 1 refills | Status: DC

## 2017-07-04 NOTE — Telephone Encounter (Signed)
Called pharmacy spoke w/pharmacist [facility](Dusty) she states it would be 1.25 gram of the 20% testosterone.Marland Kitchen.Raechel Chute/lmb

## 2017-07-04 NOTE — Addendum Note (Signed)
Addended by: Corwin LevinsJOHN, JAMES W on: 07/04/2017 11:43 AM   Modules accepted: Orders

## 2017-07-04 NOTE — Telephone Encounter (Signed)
Done hardcopy to Shirron  

## 2017-07-04 NOTE — Telephone Encounter (Signed)
There is a cream that can be prescribed, but please ask pharmacy to clarify how to prescribe;  In other words, what is the equivalent dose of testosterone 20% cream to the Androgel 5 gm per day?

## 2017-07-07 ENCOUNTER — Telehealth: Payer: Self-pay | Admitting: Pulmonary Disease

## 2017-07-07 MED ORDER — BUDESONIDE-FORMOTEROL FUMARATE 80-4.5 MCG/ACT IN AERO: 2.0000 | INHALATION_SPRAY | Freq: Two times a day (BID) | RESPIRATORY_TRACT | 0 refills | Status: DC

## 2017-07-07 NOTE — Telephone Encounter (Signed)
Gave pt two samples of symbicort.  Told pt when he was about to run out of the second sample to call us and we could then send an rx into his preferred pharmacy.  Pt expressed understanding. Nothing further needed at this current time.

## 2017-08-08 ENCOUNTER — Ambulatory Visit: Payer: Self-pay | Admitting: Pulmonary Disease

## 2017-08-16 MED FILL — AMOXICILLIN 500 MG CAPSULE: 500 | 7 days supply | Qty: 21 | Fill #0

## 2017-08-16 MED FILL — NAPROXEN 500 MG TABLET: 500 | 50 days supply | Qty: 100 | Fill #0

## 2017-08-16 MED FILL — VENTOLIN HFA 90 MCG INHALER: 108 (90 BAS | 25 days supply | Qty: 18 | Fill #0

## 2017-08-22 ENCOUNTER — Telehealth: Payer: Self-pay | Admitting: Pulmonary Disease

## 2017-08-22 MED ORDER — BUDESONIDE-FORMOTEROL FUMARATE 80-4.5 MCG/ACT IN AERO: 2.0000 | INHALATION_SPRAY | Freq: Two times a day (BID) | RESPIRATORY_TRACT | 0 refills | Status: DC

## 2017-08-22 NOTE — Telephone Encounter (Signed)
Spoke with patient, medication refill sent to [facility].

## 2017-08-24 ENCOUNTER — Telehealth: Payer: Self-pay | Admitting: Pulmonary Disease

## 2017-08-24 MED ORDER — BUDESONIDE-FORMOTEROL FUMARATE 80-4.5 MCG/ACT IN AERO: 2.0000 | INHALATION_SPRAY | Freq: Two times a day (BID) | RESPIRATORY_TRACT | 5 refills | Status: DC

## 2017-08-24 MED FILL — SYMBICORT 80-4.5 MCG INH: 80-4.5 | 30 days supply | Qty: 10 | Fill #0

## 2017-08-24 NOTE — Telephone Encounter (Signed)
Pt requesting symbicort refill.  This has been sent to preferred pharmacy.  Nothing further needed.  

## 2017-09-01 ENCOUNTER — Encounter: Payer: Self-pay | Admitting: Internal Medicine

## 2017-09-01 MED ORDER — NYSTATIN 100000 UNIT/ML MT SUSP: 500000.0000 [IU] | Freq: Four times a day (QID) | OROMUCOSAL | 0 refills | Status: AC

## 2017-09-01 NOTE — Telephone Encounter (Signed)
Copied from CRM [id number]. Topic: Quick Communication - Rx Refill/Question >> Sep 01, 2017  3:52 PM [name], Larry A wrote: Medication: Mycelex Troches Has the patient contacted their pharmacy? no (Agent: If no, request that the patient contact the pharmacy for the refill.) Preferred Pharmacy (with phone number or street name): [facility]/  pt would like this called in for his thrush  Agent: Please be advised that RX refills may take up to 3 business days. We ask that you follow-up with your pharmacy.

## 2017-09-02 MED FILL — NYSTATIN 100,000 UNITS/ML S: 100000 | 10 days supply | Qty: 200 | Fill #0

## 2017-09-02 NOTE — Telephone Encounter (Signed)
Pharmacy called regarding the nystatin,  Was written for , but this is not enough for the 10 days it was prescibred, for, please advise and send new RX to [facility]

## 2017-09-20 ENCOUNTER — Encounter: Payer: Self-pay | Admitting: Internal Medicine

## 2017-09-21 MED ORDER — VARENICLINE TARTRATE 1 MG PO TABS: 1.0000 mg | ORAL_TABLET | Freq: Two times a day (BID) | ORAL | 2 refills | Status: DC

## 2017-09-21 MED ORDER — VARENICLINE TARTRATE 0.5 MG X 11 & 1 MG X 42 PO MISC: ORAL | 0 refills | Status: DC

## 2017-09-21 MED FILL — SYMBICORT 80-4.5 MCG INH: 80-4.5 | 30 days supply | Qty: 10 | Fill #1

## 2017-09-21 MED FILL — VENTOLIN HFA 90 MCG INHALER: 108 (90 BAS | 25 days supply | Qty: 18 | Fill #1

## 2017-09-21 MED FILL — CHANTIX STARTING MONTH BOX: 0.5 MG X 11 | 28 days supply | Qty: 53 | Fill #0

## 2017-10-10 ENCOUNTER — Ambulatory Visit: Payer: Self-pay | Admitting: Pulmonary Disease

## 2017-10-14 ENCOUNTER — Ambulatory Visit: Payer: Self-pay | Admitting: Pulmonary Disease

## 2017-10-14 MED FILL — VENTOLIN HFA 90 MCG INHALER: 108 (90 BAS | 25 days supply | Qty: 18 | Fill #2

## 2017-10-14 MED FILL — SYMBICORT 80-4.5 MCG INH: 80-4.5 | 30 days supply | Qty: 10 | Fill #2

## 2017-10-25 ENCOUNTER — Ambulatory Visit (INDEPENDENT_AMBULATORY_CARE_PROVIDER_SITE_OTHER): Payer: No Typology Code available for payment source | Admitting: Internal Medicine

## 2017-10-25 ENCOUNTER — Encounter: Payer: Self-pay | Admitting: Internal Medicine

## 2017-10-25 ENCOUNTER — Telehealth: Payer: Self-pay | Admitting: Internal Medicine

## 2017-10-25 VITALS — BP 138/86 | HR 88 | Ht 74.0 in | Wt >= 6400 oz

## 2017-10-25 DIAGNOSIS — Z0001 Encounter for general adult medical examination with abnormal findings: Secondary | ICD-10-CM

## 2017-10-25 DIAGNOSIS — R400 Somnolence: Secondary | ICD-10-CM | POA: Diagnosis not present

## 2017-10-25 DIAGNOSIS — Z0000 Encounter for general adult medical examination without abnormal findings: Secondary | ICD-10-CM | POA: Diagnosis not present

## 2017-10-25 DIAGNOSIS — E291 Testicular hypofunction: Secondary | ICD-10-CM

## 2017-10-25 DIAGNOSIS — R739 Hyperglycemia, unspecified: Secondary | ICD-10-CM | POA: Diagnosis not present

## 2017-10-25 DIAGNOSIS — J4520 Mild intermittent asthma, uncomplicated: Secondary | ICD-10-CM | POA: Diagnosis not present

## 2017-10-25 MED ORDER — TESTOSTERONE 20 % CREA: 1.2500 g | TOPICAL_CREAM | Freq: Every day | 1 refills | Status: DC

## 2017-10-25 MED ORDER — AMLODIPINE BESYLATE 5 MG PO TABS: 5.0000 mg | ORAL_TABLET | Freq: Every day | ORAL | 3 refills | Status: DC

## 2017-10-25 NOTE — Telephone Encounter (Signed)
Copied from CRM [id number]. Topic: Quick Communication - See Telephone Encounter >> Oct 25, 2017  5:06 PM Landry Mellow wrote: CRM for notification. See Telephone encounter for: 10/25/17. Pharm called - they needs to have more details about Testosterone 20 % CREA Cb is [phone]

## 2017-10-25 NOTE — Assessment & Plan Note (Signed)
Pt feels improved with symbicort

## 2017-10-25 NOTE — Assessment & Plan Note (Signed)
stable overall by history and exam, recent data reviewed with pt, and pt to continue medical treatment as before,  to f/u any worsening symptoms or concerns;e Lab Results  Component Value Date   HGBA1C 6.7 (H) 03/22/2017  for f/u a1c with labs

## 2017-10-25 NOTE — Assessment & Plan Note (Signed)
Has f/u with pulm soon, has hx of abnormal sleep study

## 2017-10-25 NOTE — Progress Notes (Signed)
Subjective:    Patient ID: Larry Fowler, male    DOB: Aug 12, 1970, 47 y.o.   MRN: [id number]  HPI  Here for wellness and f/u;  Overall doing ok;  Pt denies Chest pain, worsening SOB, DOE, wheezing, orthopnea, PND, worsening LE edema, palpitations, dizziness or syncope.  Pt denies neurological change such as new headache, facial or extremity weakness.  Pt denies polydipsia, polyuria, or low sugar symptoms. Pt states overall good compliance with treatment and medications, good tolerability, and has been trying to follow appropriate diet.  Pt denies worsening depressive symptoms, suicidal ideation or panic. No fever, night sweats, wt loss, loss of appetite, or other constitutional symptoms.  Pt states good ability with ADL's, has low fall risk, home safety reviewed and adequate, no other significant changes in hearing or vision, and only occasionally active with exercise. Chantix seemed to cause worse left shoulder pain, but plans to try again.  No other interval hx or new complaint  Has been out of testosterone replacement for 1 mo, plans to restart and check all labs in 4 wks Past Medical History:  Diagnosis Date  . ALLERGIC RHINITIS 09/12/2007   Qualifier: Diagnosis of  By: Jonny Ruiz MD, Len Blalock   . ANXIETY 02/16/2007   Qualifier: Diagnosis of  By: Tyrone Apple, Lucy    . ASTHMA 12/17/2009   Qualifier: Diagnosis of  By: Jonny Ruiz MD, Len Blalock   . Cannabis abuse    currently  . Chills with fever   . DEPRESSION 09/12/2007   Qualifier: Diagnosis of  By: Jonny Ruiz MD, Len Blalock   . History of cocaine use    in his 37's  . Hyperglycemia   . Rectal fistula 2009  . Rectal pain   . Renal stone    2008   Past Surgical History:  Procedure Laterality Date  . FINGER SURGERY  2008/09?   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 knee surgury     ? which knee per Dr Thurston Hole in his teens  . TONSILLECTOMY      reports that he has been smoking.  He has never used  smokeless tobacco. He reports that he drinks alcohol. He reports that he has current or past drug history. Drug: Marijuana. family history includes Diabetes type II in his mother; Pneumonia in his father. No Known Allergies Current Outpatient Medications on File Prior to Visit  Medication Sig Dispense Refill  . albuterol (VENTOLIN HFA) 108 (90 Base) MCG/ACT inhaler Inhale 2 puffs into the lungs every 6 (six) hours as needed for wheezing. 1 Inhaler 11  . budesonide-formoterol (SYMBICORT) 80-4.5 MCG/ACT inhaler Inhale 2 puffs into the lungs 2 (two) times daily. 1 Inhaler 5  . fluticasone (FLONASE) 50 MCG/ACT nasal spray Place 2 sprays into both nostrils daily. 16 g 1  . naproxen (NAPROSYN) 500 MG tablet Take 1 tablet (500 mg total) by mouth 2 (two) times daily with a meal. 100 tablet 2  . NEOMYCIN-POLYMYXIN-HYDROCORTISONE (CORTISPORIN) 1 % SOLN OTIC solution Place 3 drops into the right ear every 4 (four) hours. 10 mL 0  . triamcinolone cream (KENALOG) 0.1 % Apply 1 application topically 2 (two) times daily. 30 g 0   No current facility-administered medications on file prior to visit.    Review of Systems Constitutional: Negative for other unusual diaphoresis, sweats, appetite or weight changes HENT: Negative for other worsening hearing loss, ear pain, facial swelling, mouth sores or neck stiffness.  Eyes: Negative for other worsening pain, redness or other visual disturbance.  Respiratory: Negative for other stridor or swelling Cardiovascular: Negative for other palpitations or other chest pain  Gastrointestinal: Negative for worsening diarrhea or loose stools, blood in stool, distention or other pain Genitourinary: Negative for hematuria, flank pain or other change in urine volume.  Musculoskeletal: Negative for myalgias or other joint swelling.  Skin: Negative for other color change, or other wound or worsening drainage.  Neurological: Negative for other syncope or numbness. Hematological:  Negative for other adenopathy or swelling Psychiatric/Behavioral: Negative for hallucinations, other worsening agitation, SI, self-injury, or new decreased concentration \\All  other system neg per pt    Objective:   Physical Exam BP 138/86   Pulse 88   Ht  (1.88 m)   Wt (!) 490 lb (222.3 kg)   SpO2 95%   BMI 62.91 kg/m  VS noted, supermorbid obese Constitutional: Pt is oriented to person, place, and time. Appears well-developed and well-nourished, in no significant distress and comfortable Head: Normocephalic and atraumatic  Eyes: Conjunctivae and EOM are normal. Pupils are equal, round, and reactive to light Right Ear: External ear normal without discharge Left Ear: External ear normal without discharge Nose: Nose without discharge or deformity Mouth/Throat: Oropharynx is without other ulcerations and moist  Neck: Normal range of motion. Neck supple. No JVD present. No tracheal deviation present or significant neck LA or mass Cardiovascular: Normal rate, regular rhythm, normal heart sounds and intact distal pulses.   Pulmonary/Chest: WOB normal and breath sounds without rales or wheezing  Abdominal: Soft. Bowel sounds are normal. NT. No HSM  Musculoskeletal: Normal range of motion. Exhibits no edema Lymphadenopathy: Has no other cervical adenopathy.  Neurological: Pt is alert and oriented to person, place, and time. Pt has normal reflexes. No cranial nerve deficit. Motor grossly intact, Gait intact Skin: Skin is warm and dry. No rash noted or new ulcerations Psychiatric:  Has irritable mood and affect. Behavior is normal without agitation No other exam findings  Lab Results  Component Value Date   WBC 8.7 05/04/2017   HGB 15.0 05/04/2017   HCT 45.0 05/04/2017   PLT 194.0 05/04/2017   GLUCOSE 131 (H) 03/21/2017   CHOL 188 03/21/2017   TRIG 141.0 03/21/2017   HDL 46.20 03/21/2017   LDLCALC 113 (H) 03/21/2017   ALT 19 03/21/2017   AST 17 03/21/2017   NA 137 03/21/2017     K 4.3 03/21/2017   CL 101 03/21/2017   CREATININE 0.54 03/21/2017   BUN 15 03/21/2017   CO2 27 03/21/2017   TSH 1.88 03/21/2017   PSA 0.16 03/21/2017   HGBA1C 6.7 (H) 03/22/2017      Assessment & Plan:

## 2017-10-25 NOTE — Assessment & Plan Note (Signed)

## 2017-10-25 NOTE — Assessment & Plan Note (Signed)
For testosterone gel continue, f/u lab as planned

## 2017-10-25 NOTE — Patient Instructions (Addendum)
Please continue all other medications as before, and refills have been done if requested - the testosterone cream and other meds  Please have the pharmacy call with any other refills you may need.  Please continue your efforts at being more active, low cholesterol diet, and weight control.  You are otherwise up to date with prevention measures today.  Please keep your appointments with your specialists as you may have planned  Please go to the LAB in the Basement (turn left off the elevator) for the tests to be done at your convenience  You will be contacted by phone if any changes need to be made immediately.  Otherwise, you will receive a letter about your results with an explanation, but please check with MyChart first.  Please remember to sign up for MyChart if you have not done so, as this will be important to you in the future with finding out test results, communicating by private email, and scheduling acute appointments online when needed.  Please return in 1 year for your yearly visit, or sooner if needed, with Lab testing done 3-5 days before

## 2017-10-26 NOTE — Telephone Encounter (Signed)
Noted  

## 2017-10-26 NOTE — Telephone Encounter (Signed)
It looks like he was getting it compounded and [employer] via [employer]. I informed the patient that [facility] can not do compounded medications and offered to have the testosterone script sent back to [facility] but he stated that he would inform his wife and have her handle it.

## 2017-10-26 NOTE — Telephone Encounter (Signed)
Alicia at the pharmacy stated that the testosterone 20% cream is not commercially available and mainly used for compounding and certain facilities. She wanted to know if another drug should have been selected? Please advise.

## 2017-10-26 NOTE — Telephone Encounter (Signed)
This is the phone note from Jul 01 2017 when I was informed by the PHARMACY that testosterone 16% at at certain dosage would be adequate replacement for androgel which was not covered by his insurance  Is the PHARMACY now saying this is not correct?  Perhaps the pharmacists can get together and determine the answer and let me know, or at least know what type of replacement would be covered with his insurance?    Phone Note previous: Note    Done hardcopy to Shirron             11:23 AM  Brand, Dairl Ponder, RMA routed this conversation to Me    Brand, Dairl Ponder, RMA       11:22 AM  Note    Called pharmacy spoke w/pharmacist [facility]) she states it would be 1.25 gram of the 20% testosterone.Marland KitchenRaechel Chute            10:54 AM  You routed this conversation to Deatra , RMA    Me       10:48 AM  Note    There is a cream that can be prescribed, but please ask pharmacy to clarify how to prescribe;  In other words, what is the equivalent dose of testosterone 20% cream to the Androgel 5 gm per day?

## 2017-10-26 NOTE — Telephone Encounter (Signed)
Ok, let me know of there is anything else to assist. Thanks

## 2017-11-08 ENCOUNTER — Ambulatory Visit: Payer: Self-pay | Admitting: Pulmonary Disease

## 2017-11-10 MED FILL — SYMBICORT 80-4.5 MCG INH: 80-4.5 | 30 days supply | Qty: 10 | Fill #3

## 2017-11-10 MED FILL — VENTOLIN HFA 90 MCG INHALER: 108 (90 BAS | 25 days supply | Qty: 18 | Fill #3

## 2017-11-25 ENCOUNTER — Encounter: Payer: Self-pay | Admitting: Internal Medicine

## 2017-11-25 DIAGNOSIS — M25512 Pain in left shoulder: Secondary | ICD-10-CM

## 2017-12-03 NOTE — Progress Notes (Addendum)
Tawana ScaleZach Darlisha Kelm D.O. [facility] [address] [place], [place] [address] Phone: [phone] Subjective:    I'm seeing this patient by the request  of:  Corwin LevinsJohn, James W, MD   CC: left shoulder pain   BJY:[id number]:Subjective  Lilly CoveRaymond S Markell is a 47 y.o. male coming in with complaint of left shoulder pain going on for 1 year.  Describes the pain as an aching condition pain is in anterior shoulder. Pain radiates into the hand. Fingers do go numb.  Seems to be more around the thumb.  Denies injections.  Lying on shoulder increases pain.  Increases the pain significantly and often causes to get out of bed sometimes.  Patient states that the pain is improved when he gets up patient occasionally has pain into the left cervical spine Pain can be sharp but otherwise is dull and achy. Patient works as International aid/development workergroomer, leans on the counter a lot with pec muscles contracted, also plays video games.  Rates the severity of pain is 6 out of 10 and seems to be worsening recently.  Patient states that it is affecting daily activities.  Has tried Tylenol with minimal benefits.  Denies any true injury.    Past Medical History:  Diagnosis Date  . ALLERGIC RHINITIS 09/12/2007   Qualifier: Diagnosis of  By: Jonny RuizJohn MD, Len BlalockJames W   . ANXIETY 02/16/2007   Qualifier: Diagnosis of  By: Tyrone AppleBrand RMA, Lucy    . ASTHMA 12/17/2009   Qualifier: Diagnosis of  By: Jonny RuizJohn MD, Len BlalockJames W   . Cannabis abuse    currently  . Chills with fever   . DEPRESSION 09/12/2007   Qualifier: Diagnosis of  By: Jonny RuizJohn MD, Len BlalockJames W   . History of cocaine use    in his 6520's  . Hyperglycemia   . Rectal fistula 2009  . Rectal pain   . Renal stone    2008   Past Surgical History:  Procedure Laterality Date  . FINGER SURGERY  2008/09?   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 knee surgury     ? which knee per Dr Thurston HoleWainer in his teens  . TONSILLECTOMY     Social History   Socioeconomic History  .  Marital status: Married    Spouse name: Not on file  . Number of children: Not on file  . Years of education: Not on file  . Highest education level: Not on file  Occupational History  . Not on file  Social Needs  . Financial resource strain: Not on file  . Food insecurity:    Worry: Not on file    Inability: Not on file  . Transportation needs:    Medical: Not on file    Non-medical: Not on file  Tobacco Use  . Smoking status: Current Every Day Smoker    Last attempt to quit: 03/01/2011    Years since quitting: 6.7  . Smokeless tobacco: Never Used  Substance and Sexual Activity  . Alcohol use: Yes  . Drug use: Yes    Types: Marijuana  . Sexual activity: Not on file  Lifestyle  . Physical activity:    Days per week: Not on file    Minutes per session: Not on file  . Stress: Not on file  Relationships  . Social connections:    Talks on phone: Not on file    Gets together: Not on file    Attends religious  service: Not on file    Active member of club or organization: Not on file    Attends meetings of clubs or organizations: Not on file    Relationship status: Not on file  Other Topics Concern  . Not on file  Social History Narrative  . Not on file   No Known Allergies Family History  Problem Relation Age of Onset  . Diabetes type II Mother   . Pneumonia Father      Past medical history, social, surgical and family history all reviewed in electronic medical record.  No pertanent information unless stated regarding to the chief complaint.   Review of Systems:Review of systems updated and as accurate as of 12/05/17  No headache, visual changes, nausea, vomiting, diarrhea, constipation, dizziness, abdominal pain, skin rash, fevers, chills, night sweats, weight loss, swollen lymph nodes, body aches, joint swelling,  chest pain, shortness of breath, mood changes.  Positive muscle aches  Objective  Blood pressure (!) 138/100, pulse 87, height 6\' 2"  (1.88 m), weight (!)  494 lb (224.1 kg), SpO2 97 %. Systems examined below as of 12/05/17   General: No apparent distress alert and oriented x3 mood and affect normal, dressed appropriately.  HEENT: Pupils equal, extraocular movements intact  Respiratory: Patient's speak in full sentences and does not appear short of breath  Cardiovascular: No lower extremity edema, non tender, no erythema  Skin: Warm dry intact with no signs of infection or rash on extremities or on axial skeleton.  Abdomen: Soft nontender morbidly obese Neuro: Cranial nerves II through XII are intact, neurovascularly intact in all extremities with 2+ DTRs and 2+ pulses.  Lymph: No lymphadenopathy of posterior or anterior cervical chain or axillae bilaterally.  Gait mild antalgic secondary to patient's body habitus MSK:  Non tender with full range of motion and good stability and symmetric strength and tone of  elbows, wrist, hip, knee and ankles bilaterally.  Mild arthritic changes of multiple joints Shoulder: Left Inspection reveals no abnormalities, atrophy or asymmetry. Palpation is normal with no tenderness over AC joint or bicipital groove. ROM is full in all planes. Rotator cuff strength normal throughout. .  Mild impingement noted. Speeds and Yergason's tests positive No labral pathology noted with negative Obrien's, negative clunk and good stability. Normal scapular function observed. No painful arc and no drop arm sign. No apprehension sign Contralateral shoulder unremarkable  MSK US performed of: Left shoulder This study was ordered, performed, and interpreted by Terrilee Files D.O.  Shoulder:   Supraspinatus:  Appears normal on long and transverse views, no bursal bulge seen with shoulder abduction on impingement view. Infraspinatus:  Appears normal on long and transverse views. Subscapularis:  Appears normal on long and transverse views. Teres Minor:  Appears normal on long and transverse views. AC joint: Mild arthritic  changes Glenohumeral Joint:  Appears normal without effusion. Glenoid Labrum:  Intact without visualized tears. Biceps Tendon: Hypoechoic changes noted within the tendon sheath.  Patient does have what appears to be chronic subluxation noted as well. Impression: Chronic biceps subluxation with tendinitis  97110; 15 additional minutes spent for Therapeutic exercises as stated in above notes.  This included exercises focusing on stretching, strengthening, with significant focus on eccentric aspects.   Long term goals include an improvement in range of motion, strength, endurance as well as avoiding reinjury. Patient's frequency would include in 1-2 times a day, 3-5 times a week for a duration of 6-12 weeks.  Shoulder Exercises that included:  Basic scapular stabilization  to include adduction and depression of scapula Scaption, focusing on proper movement and good control Internal and External rotation utilizing a theraband, with elbow tucked at side entire time Rows with theraband which was given  Proper technique shown and discussed handout in great detail with ATC.  All questions were discussed and answered.     Impression and Recommendations:     This case required medical decision making of moderate complexity.      Note: This dictation was prepared with Dragon dictation along with smaller phrase technology. Any transcriptional errors that result from this process are unintentional.

## 2017-12-05 ENCOUNTER — Telehealth: Payer: Self-pay

## 2017-12-05 ENCOUNTER — Encounter: Payer: Self-pay | Admitting: Family Medicine

## 2017-12-05 ENCOUNTER — Ambulatory Visit: Payer: No Typology Code available for payment source | Admitting: Family Medicine

## 2017-12-05 ENCOUNTER — Ambulatory Visit: Payer: Self-pay

## 2017-12-05 ENCOUNTER — Other Ambulatory Visit: Payer: Self-pay

## 2017-12-05 VITALS — BP 138/100 | HR 87 | Ht 74.0 in | Wt >= 6400 oz

## 2017-12-05 DIAGNOSIS — M67922 Unspecified disorder of synovium and tendon, left upper arm: Secondary | ICD-10-CM | POA: Diagnosis not present

## 2017-12-05 DIAGNOSIS — M25512 Pain in left shoulder: Secondary | ICD-10-CM | POA: Diagnosis not present

## 2017-12-05 DIAGNOSIS — G8929 Other chronic pain: Secondary | ICD-10-CM

## 2017-12-05 MED ORDER — DICLOFENAC SODIUM 2 % TD SOLN: 2.0000 g | Freq: Two times a day (BID) | TRANSDERMAL | 3 refills | Status: DC

## 2017-12-05 NOTE — Assessment & Plan Note (Signed)
I believe the patient does have more of a chronic biceps subluxation.  It appears ultrasound and patient's body habitus there was no although rotator cuff abnormality noted.  We discussed icing regimen, compression, given home exercise with athletic trainer.  Follow-up again in 4 weeks.

## 2017-12-05 NOTE — Telephone Encounter (Signed)
Copied from CRM [id number]. Topic: General - Other >> Dec 05, 2017 11:44 AM Mcneil, Ja-Kwan wrote: Reason for CRM: Pt called in asking if he has to wear the compression sleeve all day. Pt requests call back. Cb# [phone]

## 2017-12-05 NOTE — Telephone Encounter (Signed)
Spoke with patient. Let him know that he should wear the compression stocking during the day but not while he is sleeping. Notes some pain at his elbow with sleeve use. Denies any numbness or tingling in fingers. Recommend that he try compression for a few days and to discontinue wear if pain persists. Patient voices understanding.

## 2017-12-05 NOTE — Patient Instructions (Signed)
Good to see you.  Ice 20 minutes 2 times daily. Usually after activity and before bed. pennsaid pinkie amount topically 2 times daily as needed.  Keep hands within peripheral vision Exercises 3 times a week.    Duxies 3 times a day for 3 days  See em again in 4 weeks to make sure it is gone an dwill check on the knee.

## 2017-12-09 MED FILL — VENTOLIN HFA 90 MCG INHALER: 108 (90 BAS | 25 days supply | Qty: 18 | Fill #4

## 2017-12-09 MED FILL — SYMBICORT 80-4.5 MCG INH: 80-4.5 | 30 days supply | Qty: 10 | Fill #4

## 2017-12-09 MED FILL — AMLODIPINE BESYLATE 5 MG TA: 5 | 90 days supply | Qty: 90 | Fill #0

## 2017-12-12 ENCOUNTER — Encounter: Payer: Self-pay | Admitting: Pulmonary Disease

## 2017-12-12 ENCOUNTER — Ambulatory Visit (INDEPENDENT_AMBULATORY_CARE_PROVIDER_SITE_OTHER): Payer: No Typology Code available for payment source | Admitting: Pulmonary Disease

## 2017-12-12 VITALS — BP 132/88 | HR 86 | Ht 74.0 in

## 2017-12-12 DIAGNOSIS — G4733 Obstructive sleep apnea (adult) (pediatric): Secondary | ICD-10-CM

## 2017-12-12 DIAGNOSIS — J4540 Moderate persistent asthma, uncomplicated: Secondary | ICD-10-CM | POA: Diagnosis not present

## 2017-12-12 NOTE — Assessment & Plan Note (Signed)
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  Spirometry pre-and post next visit  Wear a mask while at work

## 2017-12-12 NOTE — Assessment & Plan Note (Signed)
Please schedule CPAP titration study. Based on this we will set you up with a CPAP machine  Weight loss encouraged, compliance with goal of at least 4-6 hrs every night is the expectation. Advised against medications with sedative side effects Cautioned against driving when sleepy - understanding that sleepiness will vary on a day to day basis

## 2017-12-12 NOTE — Progress Notes (Signed)
   Subjective:    Patient ID: Larry Fowler, male    DOB: Mar 08, 1971, 47 y.o.   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 He works as a Research scientist (medical)dog groomer for 20 years .  Asthma was diagnosed a few years ago and he uses albuterol as a rescue inhaler.  He has been using this up to 3 times daily.  He feels better on weekends when he is off. He has a history of substance abuse, quit cocaine many years ago, smokes marijuana about 2-3 times a week, smokes tobacco a pack lasting about 3 days-about 15 pack years.  He was almost able to quit smoking with using Chantix but developed left shoulder pain control that Was worsening it and stopped it and has now started smoking again.  He is again planning to make another quit attempt.  He wonders if he has good tumors lung.  Does not use a mask at work.  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  Also uses a lot of talcum powder on himself  We discussed results of sleep study.  He did not schedule CPAP titration since he did not have insurance and is still feels too high but is willing to do this now   Significant tests/ events reviewed  Spirometry 04/2017  ratio of 67, FEV1 of 48% and FVC of 56% suggesting moderate to severe airway obstruction  RAST 04/2017 neg,  IgE 46  Eos 200  HST 05/2017 AHI 21/h   Review of Systems neg for any significant sore throat, dysphagia, itching, sneezing, nasal congestion or excess/ purulent secretions, fever, chills, sweats, unintended wt loss, pleuritic or exertional cp, hempoptysis, orthopnea pnd or change in chronic leg swelling. Also denies presyncope, palpitations, heartburn, abdominal pain, nausea, vomiting, diarrhea or change in bowel or urinary habits, dysuria,hematuria, rash, arthralgias, visual complaints, headache, numbness weakness or ataxia.     Objective:   Physical Exam   Gen. Pleasant, obese, in no distress ENT - no lesions, no post nasal drip Neck: No  JVD, no thyromegaly, no carotid bruits Lungs: no use of accessory muscles, no dullness to percussion, decreased without rales or rhonchi  Cardiovascular: Rhythm regular, heart sounds  normal, no murmurs or gallops, no peripheral edema Musculoskeletal: No deformities, no cyanosis or clubbing , no tremors        Assessment & Plan:

## 2017-12-12 NOTE — Patient Instructions (Signed)
Please schedule CPAP titration study. Based on this, we will set you up with a CPAP machine  Refills on Symbicort and albuterol.  Spirometry pre-and post next visit  Wear a mask while at work

## 2017-12-29 ENCOUNTER — Encounter: Payer: Self-pay | Admitting: Internal Medicine

## 2017-12-30 ENCOUNTER — Telehealth: Payer: Self-pay | Admitting: Internal Medicine

## 2017-12-30 MED ORDER — NYSTATIN 100000 UNIT/ML MT SUSP: 500000.0000 [IU] | Freq: Four times a day (QID) | OROMUCOSAL | 1 refills | Status: AC

## 2017-12-30 MED FILL — NYSTATIN 100,000 UNITS/ML S: 100000 | 10 days supply | Qty: 200 | Fill #0

## 2017-12-30 MED FILL — VENTOLIN HFA 90 MCG INHALER: 108 (90 BAS | 25 days supply | Qty: 18 | Fill #5

## 2017-12-30 NOTE — Telephone Encounter (Signed)
Called [facility], was not able to speak to a pharmacist. Left msg on pharmacy provider line to call back to discuss the additional details needed for this medication.

## 2017-12-30 NOTE — Telephone Encounter (Signed)
Spoke to pharmacist, she needed clarification on the directions vs quantity sent in.

## 2017-12-30 NOTE — Telephone Encounter (Signed)
Copied from CRM [id number]. Topic: Quick Communication - See Telephone Encounter >> Dec 30, 2017 10:06 AM Windy KalataMichael, Taiquan Campanaro L, NT wrote: CRM for notification. See Telephone encounter for: 12/30/17.  Darl PikesSusan is a Air traffic controllerpharmacist calling from [facility] and has questions about nystatin (MYCOSTATIN) 100000 UNIT/ML suspension. Please contact at [phone]

## 2018-01-02 MED FILL — SYMBICORT 80-4.5 MCG INH: 80-4.5 | 30 days supply | Qty: 10 | Fill #5

## 2018-01-08 ENCOUNTER — Ambulatory Visit (HOSPITAL_BASED_OUTPATIENT_CLINIC_OR_DEPARTMENT_OTHER): Payer: No Typology Code available for payment source | Attending: Pulmonary Disease | Admitting: Pulmonary Disease

## 2018-01-08 VITALS — Ht 74.0 in | Wt >= 6400 oz

## 2018-01-08 DIAGNOSIS — G4733 Obstructive sleep apnea (adult) (pediatric): Secondary | ICD-10-CM | POA: Insufficient documentation

## 2018-01-08 DIAGNOSIS — G4761 Periodic limb movement disorder: Secondary | ICD-10-CM | POA: Insufficient documentation

## 2018-01-09 MED FILL — NAPROXEN 500 MG TABLET: 500 | 50 days supply | Qty: 100 | Fill #1

## 2018-01-10 ENCOUNTER — Telehealth: Payer: Self-pay | Admitting: Pulmonary Disease

## 2018-01-10 DIAGNOSIS — G4730 Sleep apnea, unspecified: Secondary | ICD-10-CM | POA: Diagnosis not present

## 2018-01-10 NOTE — Telephone Encounter (Signed)
[facility] 12-15 cm H2O with a medium nasal mask   OV in 6 wks with NP/ me

## 2018-01-10 NOTE — Procedures (Signed)
Patient Name: Larry Fowler, Larry Fowler Study Date: 01/08/2018 Gender: Male D.O.B: [birth year] Age (years): [age] Referring Provider: Cyril Mourningakesh Kimra Kantor MD, ABSM Height (inches): 74 Interpreting Physician: Cyril Mourningakesh Daiden Coltrane MD, ABSM Weight (lbs): 480 RPSGT: [name], [name] BMI: 62 MRN: [id number] Neck Size: 20.00 <br> <br> CLINICAL INFORMATION The patient is referred for a CPAP titration to treat sleep apnea.  Date of HST:  05/2017 AHI 21/h  SLEEP STUDY TECHNIQUE As per the AASM Manual for the Scoring of Sleep and Associated Events v2.3 (April 2016) with a hypopnea requiring 4% desaturations.  The channels recorded and monitored were frontal, central and occipital EEG, electrooculogram (EOG), submentalis EMG (chin), nasal and oral airflow, thoracic and abdominal wall motion, anterior tibialis EMG, snore microphone, electrocardiogram, and pulse oximetry. Continuous positive airway pressure (CPAP) was initiated at the beginning of the study and titrated to treat sleep-disordered breathing.  MEDICATIONS Medications self-administered by patient taken the night of the study : N/A  TECHNICIAN COMMENTS Comments added by technician: Patient had difficulty initiating sleep. Patient was restless all through the night.  RESPIRATORY PARAMETERS Optimal PAP Pressure (cm): 14cm AHI at Optimal Pressure (/hr): 0 Overall Minimal O2 (%): 88.0 Supine % at Optimal Pressure (%): 0 Minimal O2 at Optimal Pressure (%): 88.0   SLEEP ARCHITECTURE The study was initiated at 10:45:42 PM and ended at 6:04:35 AM.  Sleep onset time was 45.2 minutes and the sleep efficiency was 37.1%%. The total sleep time was 163 minutes.  The patient spent 10.1%% of the night in stage N1 sleep, 54.0%% in stage N2 sleep, 0.0%% in stage N3 and 35.9% in REM.Stage REM latency was 300.0 minutes  Wake after sleep onset was 230.7. Alpha intrusion was absent. Supine sleep was 0.00%.  CARDIAC DATA The 2 lead EKG demonstrated sinus rhythm. The mean heart  rate was 60.2 beats per minute. Other EKG findings include: None.   LEG MOVEMENT DATA The total Periodic Limb Movements of Sleep (PLMS) were 0. The PLMS index was 0.0. A PLMS index of <15 is considered normal in adults.  IMPRESSIONS - An optimal PAP pressure of 14 cm for this patient based on the available study data. - Central sleep apnea was not noted during this titration (CAI = 2.2/h). - Mild oxygen desaturations were observed during this titration (min O2 = 88.0%). - The patient snored with moderate snoring volume during this titration study. - No cardiac abnormalities were observed during this study. - Clinically significant periodic limb movements were not noted during this study. Arousals associated with PLMs were significant.   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 - Sleep hygiene should be reviewed to assess factors that may improve sleep quality. - Weight management and regular exercise should be initiated or continued. - Return to [facility] for re-evaluation after 4 weeks of therapy  Cyril Mourningakesh Tabytha Gradillas MD Board Certified in Sleep medicine

## 2018-01-15 DIAGNOSIS — G4733 Obstructive sleep apnea (adult) (pediatric): Secondary | ICD-10-CM

## 2018-01-16 NOTE — Telephone Encounter (Signed)
Pt emailed requesting a call back to speak with s/o regarding cpap titration study Pt would like RA to call him back tomorrow regarding the sleep study, and why they have to use wires  Pt states that he could not sleep at all, the wires would move, and pt doesn't think there was accurate data Pt would like to know why blue tooth is not an option in sleep studies.  RA please advise

## 2018-01-16 NOTE — Telephone Encounter (Signed)
See MyChart message from 01/15/18.

## 2018-01-17 NOTE — Telephone Encounter (Signed)
It is true that he only slept for 2-1/2 hours. However the amount of sleep was enough for us to ballpark pressure required on CPAP. We can send prescription accordingly for his smart machine that can use a pressure range and on his follow-up we can treat this settings required based on report from the machine   Sorry about the wires-hopefully in another 5 years we will have Bluetooth technology for this-hopefully he will need another sleep study until then!

## 2018-01-17 NOTE — Telephone Encounter (Signed)
Called patient, discussed in detail the sleep study process- particularly the wires used in the test.   Pt has agreed to cpap use.   cpap ordered.  Nothing further needed.

## 2018-01-24 ENCOUNTER — Telehealth: Payer: Self-pay | Admitting: Pulmonary Disease

## 2018-01-24 MED FILL — CHANTIX 1 MG CONT MONTH BOX: 1 | 28 days supply | Qty: 56 | Fill #0

## 2018-01-24 MED FILL — VENTOLIN HFA 90 MCG INHALER: 108 (90 BAS | 25 days supply | Qty: 18 | Fill #6

## 2018-01-25 NOTE — Telephone Encounter (Signed)
I have sent order to [facility].  Nothing further needed. °

## 2018-01-26 ENCOUNTER — Other Ambulatory Visit: Payer: Self-pay | Admitting: *Deleted

## 2018-01-26 MED ORDER — BUDESONIDE-FORMOTEROL FUMARATE 80-4.5 MCG/ACT IN AERO: 2.0000 | INHALATION_SPRAY | Freq: Two times a day (BID) | RESPIRATORY_TRACT | 5 refills | Status: DC

## 2018-01-26 MED FILL — SYMBICORT 80-4.5 MCG INH: 80-4.5 | 30 days supply | Qty: 10 | Fill #0

## 2018-01-30 ENCOUNTER — Telehealth: Payer: Self-pay | Admitting: Pulmonary Disease

## 2018-01-30 NOTE — Telephone Encounter (Signed)
Called and spoke with patient, he states that he does not want to have a machine ordered for him as this is going to be too expensive, he also stated that he was unable to sleep during his sleeping test so he would like to have another one done after taking a sleeping pill. After speaking with Dr. Craige CottaSood since RA is out of the office, he suggested that patient come in to be seen for an appointment to discuss the matter. Patient stated that he would call back at a later time to schedule an appointment.

## 2018-02-15 MED FILL — VENTOLIN HFA 90 MCG INHALER: 108 (90 BAS | 25 days supply | Qty: 18 | Fill #7

## 2018-02-17 ENCOUNTER — Encounter: Payer: Self-pay | Admitting: Internal Medicine

## 2018-02-17 MED ORDER — CYCLOBENZAPRINE HCL 5 MG PO TABS: 5.0000 mg | ORAL_TABLET | Freq: Three times a day (TID) | ORAL | 1 refills | Status: DC | PRN

## 2018-02-17 MED FILL — CYCLOBENZAPRINE 5 MG TABLET: 5 | 13 days supply | Qty: 40 | Fill #0

## 2018-03-08 MED FILL — VENTOLIN HFA 90 MCG INHALER: 108 (90 BAS | 25 days supply | Qty: 18 | Fill #8

## 2018-03-12 NOTE — Progress Notes (Signed)
$'@Patient'y$  ID: Larry Fowler, male    DOB: Nov 13, 1970, 47 y.o.   MRN: [id number]  Chief Complaint  Patient presents with  . Follow-up    OSA follow-up    Referring provider: [name], MD  HPI:  47 year old male morbidly obese current everyday dog groomer followed in our office for obstructive sleep apnea and asthma  PMH: History of substance abuse, quit cocaine many years ago, smokes marijuana 2-3 times a week, smokes tobacco 1/3 pack a day.  15-pack-year history Smoker/ Smoking History: Current smoker, 1/3 pack per day smoker, 15 pack year smoker.  Maintenance:  Symbicort 80 Pt of: Dr. Elsworth Soho   03/13/2018  - Visit    47 year old male presenting today for follow-up visit.  Patient would like to discuss the results of his CPAP titration.  Patient would like to know where to go from here as patient has been diagnosed with obstructive sleep apnea but he does not feel that the CPAP titration was accurate as he reports that he did not sleep the entire time.  Patient would like to start CPAP therapy.  Patient is confused though has [facility] and contacted the patient told him I will call $[phone] for him to start on a BiPAP.  Patient is confused on whether or not he needs a BiPAP or a CPAP.  Patient has not been adherent to taking his Symbicort 80.  Patient has been using his as needed inhaler has been using his rescue inhaler albuterol 7-8 times daily.  Patient still reporting shortness of breath.  Patient did report that he had thrush when he was using his Symbicort regularly but patient was not rinsing his mouth out after use.  Patient is requesting a refill of his rescue inhaler.  Patient's weight is elevated today.  Patient with chronic lower extremity swelling.  Patient reports that he was told that he would always have lower extremity swelling because he is "fat".  Patient reports that primary care started him on amlodipine for blood pressure management the patient reports  that he only took it for a few days and then he stopped taking it.  Blood pressure today is stable.  Patient has not notified primary care regarding his nonadherence to blood pressure medication.  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  Patient is also use baby powder since he was very young.  Patient is concerned that this may have affect his breathing.  Patient is still smoking 4 to 5 cigarettes a day.  Patient would like to start Chantix again.  Patient is requesting a prescription of that today.   Tests:  Spirometry 04/2017  ratio of 67, FEV1 of 48% and FVC of 56% suggesting moderate to severe airway obstruction  RAST 04/2017 neg,  IgE 46, Eos 200  05/2017-HST- AHI 21/h   01/08/2018-CPAP titration- total sleep time 163 minutes, optimal CPAP pressure 14, >>>Recommend trial of auto CPAP 12-15  Chart Review:     Specialty Problems      Pulmonary Problems   Allergic rhinitis    Qualifier: Diagnosis of  By: Jenny Reichmann MD, Hunt Oris       Asthma    Occupational  ?groomer's lung  Spirometry 04/2017  ratio of 67, FEV1 of 48% and FVC of 56% suggesting moderate to severe airway obstruction  RAST 04/2017 neg,  IgE  46, Eos 200  03/13/18 >>> PFT ordered >>>       OSA (obstructive sleep apnea)    05/2017-HST- AHI 21/h   01/08/2018-CPAP titration- total sleep time 163 minutes, optimal CPAP pressure 14, >>>Recommend trial of auto CPAP 12-15         No Known Allergies   There is no immunization history on file for this patient.  Past Medical History:  Diagnosis Date  . ALLERGIC RHINITIS 09/12/2007   Qualifier: Diagnosis of  By: Jenny Reichmann MD, Hunt Oris   . ANXIETY 02/16/2007   Qualifier: Diagnosis of  By: Reatha Armour, Lucy    . ASTHMA 12/17/2009   Qualifier: Diagnosis of  By: Jenny Reichmann MD, Hunt Oris   . Cannabis abuse    currently  . Chills with fever   .  DEPRESSION 09/12/2007   Qualifier: Diagnosis of  By: Jenny Reichmann MD, Hunt Oris   . History of cocaine use    in his 40's  . Hyperglycemia   . Rectal fistula 2009  . Rectal pain   . Renal stone    2008    Tobacco History: Social History   Tobacco Use  Smoking Status Current Every Day Smoker  . Packs/day: 0.25  . Last attempt to quit: 03/01/2011  . Years since quitting: 7.0  Smokeless Tobacco Never Used  Tobacco Comment   doesn't smoke the whole cig, burns out while playing video game   Ready to quit: Yes Counseling given: Yes Comment: doesn't smoke the whole cig, burns out while playing video game  Smoking assessment and cessation counseling  Patient currently smoking: Patient currently smoking 4 to 5 cigarettes a day.  Patient reports that he does not smoke all of the ambulance and burnout right next to him while he is playing video games. I have advised the patient to quit/stop smoking as soon as possible due to high risk for multiple medical problems.  It will also be very difficult for [place] to manage patient's  respiratory symptoms and status if we continue to expose her lungs to a known irritant.  We do not advise e-cigarettes as a form of stopping smoking.  Patient is willing to quit smoking.  I have advised the patient that we can assist and have options of nicotine replacement therapy, provided smoking cessation education today, provided smoking cessation counseling, and provided cessation resources.  Patient would like to restart Chantix.  Patient had success with Chantix in the past.  Follow-up next office visit office visit for assessment of smoking cessation.  Smoking cessation counseling advised for: 5 min    Outpatient Encounter Medications as of 03/13/2018  Medication Sig  . albuterol (VENTOLIN HFA) 108 (90 Base) MCG/ACT inhaler Inhale 2 puffs into the lungs every 6 (six) hours as needed for wheezing.  . budesonide-formoterol (SYMBICORT) 80-4.5 MCG/ACT inhaler Inhale 2  puffs into the lungs 2 (two) times daily.  . Diclofenac Sodium 2 % SOLN Place 2 g onto the skin 2 (two) times daily.  . fluticasone (FLONASE) 50 MCG/ACT nasal spray Place 2 sprays into both nostrils daily.  . Testosterone 20 % CREA Apply 1.25 g topically daily.  Marland Kitchen triamcinolone cream (KENALOG) 0.1 % Apply 1 application topically 2 (two) times daily.  . [DISCONTINUED] albuterol (VENTOLIN HFA) 108 (90 Base) MCG/ACT inhaler Inhale 2 puffs into the lungs every 6 (six) hours as needed for wheezing.  . [DISCONTINUED] fluticasone (FLONASE) 50 MCG/ACT nasal spray Place 2 sprays into both nostrils daily.  . [DISCONTINUED] fluticasone (FLONASE) 50  MCG/ACT nasal spray Place 2 sprays into both nostrils daily.  Marland Kitchen amLODipine (NORVASC) 5 MG tablet Take 1 tablet (5 mg total) by mouth daily. (Patient not taking: Reported on 03/13/2018)  . cyclobenzaprine (FLEXERIL) 5 MG tablet Take 1 tablet (5 mg total) by mouth 3 (three) times daily as needed for muscle spasms. (Patient not taking: Reported on 03/13/2018)  . varenicline (CHANTIX STARTING MONTH PAK) 0.5 MG X 11 & 1 MG X 42 tablet Take one 0.5 mg tablet by mouth once daily for 3 days, then increase to one 0.5 mg tablet twice daily for 4 days, then increase to one 1 mg tablet twice daily.   No facility-administered encounter medications on file as of 03/13/2018.      Review of Systems  Review of Systems  Constitutional: Positive for fatigue. Negative for activity change, chills, fever and unexpected weight change.  HENT: Positive for hearing loss (Muffled hearing). Negative for postnasal drip, rhinorrhea, sinus pressure, sinus pain, sneezing and sore throat.   Eyes: Negative.   Respiratory: Positive for shortness of breath. Negative for cough and wheezing.   Cardiovascular: Positive for leg swelling. Negative for chest pain and palpitations.  Gastrointestinal: Negative for constipation, diarrhea, nausea and vomiting.  Endocrine: Negative.   Musculoskeletal:  Negative.   Skin: Positive for color change (Chronic lower extremity darkened skin).  Neurological: Negative for dizziness and headaches.  Psychiatric/Behavioral: Negative.  Negative for dysphoric mood. The patient is not nervous/anxious.   All other systems reviewed and are negative.    Physical Exam  BP 106/62 (BP Location: Left Wrist, Cuff Size: Normal)   Pulse 97   Ht 6' 2"  (1.88 m)   Wt (!) 522 lb 6.4 oz (237 kg)   SpO2 97%   BMI 67.07 kg/m   Wt Readings from Last 5 Encounters:  03/13/18 (!) 522 lb 6.4 oz (237 kg)  01/08/18 (!) 480 lb (217.7 kg)  12/05/17 (!) 494 lb (224.1 kg)  10/25/17 (!) 490 lb (222.3 kg)  06/16/17 (!) 481 lb (218.2 kg)   >>> Discussed patient's weight gain patient.  Patient confirms that he has not been actively trying to lose weight.  Patient is eating whatever he wants.  Patient knows that his weight affects his breathing.  Physical Exam  Constitutional: He is oriented to person, place, and time and well-developed, well-nourished, and in no distress. No distress.  HENT:  Head: Normocephalic and atraumatic.  Right Ear: Hearing and external ear normal.  Left Ear: Hearing and external ear normal.  Nose: Mucosal edema and rhinorrhea present. Right sinus exhibits no maxillary sinus tenderness and no frontal sinus tenderness. Left sinus exhibits no maxillary sinus tenderness and no frontal sinus tenderness.  Mouth/Throat: Uvula is midline and oropharynx is clear and moist. No oropharyngeal exudate.  +post nasal drip, +mallampati III, TMs with effusion without infection   Eyes: Pupils are equal, round, and reactive to light.  Neck: Normal range of motion. Neck supple. No JVD present.  Cardiovascular: Normal rate, regular rhythm and normal heart sounds.  Pulmonary/Chest: Effort normal and breath sounds normal. No accessory muscle usage. No respiratory distress. He has no decreased breath sounds. He has no wheezes. He has no rhonchi. He has no rales.    Abdominal: Soft. Bowel sounds are normal. There is no tenderness.  Musculoskeletal: Normal range of motion. He exhibits no edema.  Lymphadenopathy:    He has no cervical adenopathy.  Neurological: He is alert and oriented to person, place, and time. Gait normal.  Skin: Skin is warm, dry and intact. He is not diaphoretic. No erythema.  Darkened skin and lower extremities.  This is chronic.  Psychiatric: Mood, memory, affect and judgment normal.  Nursing note and vitals reviewed.    Lab Results:  CBC    Component Value Date/Time   WBC 8.7 05/04/2017 1014   RBC 4.73 05/04/2017 1014   HGB 15.0 05/04/2017 1014   HCT 45.0 05/04/2017 1014   PLT 194.0 05/04/2017 1014   MCV 95.1 05/04/2017 1014   MCHC 33.3 05/04/2017 1014   RDW 14.2 05/04/2017 1014   LYMPHSABS 2.2 05/04/2017 1014   MONOABS 0.6 05/04/2017 1014   EOSABS 0.2 05/04/2017 1014   BASOSABS 0.0 05/04/2017 1014    BMET    Component Value Date/Time   NA 137 03/21/2017 1358   K 4.3 03/21/2017 1358   CL 101 03/21/2017 1358   CO2 27 03/21/2017 1358   GLUCOSE 131 (H) 03/21/2017 1358   BUN 15 03/21/2017 1358   CREATININE 0.54 03/21/2017 1358   CALCIUM 9.5 03/21/2017 1358   GFRNONAA >60 02/22/2007 0153   GFRAA  02/22/2007 0153    >60        The eGFR has been calculated using the MDRD equation. This calculation has not been validated in all clinical    BNP No results found for: BNP  ProBNP No results found for: PROBNP    Assessment & Plan:   47 year old male patient presenting today.  Will get patient started on CPAP therapy.  Will place order today.  Patient to follow-up in 8 weeks with at least 30-day compliance to CPAP therapy.  Will start patient on Chantix to help him with stopping smoking.  Patient refused flu vaccine today.  Will order PFT to further assess with breathing.  Will restart Symbicort 80 for management of asthma.  Patient was not taking correctly before.  Could consider referral to bariatrics  at follow-up appointment for further management of weight.  OSA (obstructive sleep apnea) We [facility] >>> 12-15 autotitrating >>> Mask of choice  We recommend that you continue using your CPAP daily >>>Keep up the hard work using your device >>> Goal should be wearing this for the entire night that you are sleeping, at least 4 to 6 hours  Remember:  . Do not drive or operate heavy machinery if tired or drowsy.  . Please notify the supply company and office if you are unable to use your device regularly due to missing supplies or machine being broken.  . Work on maintaining a healthy weight and following your recommended nutrition plan  . Maintain proper daily exercise and movement  . Maintaining proper use of your device can also help improve management of other chronic illnesses such as: Blood pressure, blood sugars, and weight management.   BiPAP/ CPAP Cleaning:  >>>Clean weekly, with Dawn soap, and bottle brush.  Set up to air dry.  We recommend that you stop smoking.   Follow-up in 8 weeks to show 30-day compliance with CPAP therapy   Asthma Restart Symbicort 80 >>> 2 puffs in the morning right when you wake up, rinse out your mouth after use, 12 hours later 2 puffs, rinse after use >>> Take this daily, no matter what >>> This is not a rescue inhaler   Pulmonary function test ordered at next OV in 8 weeks   Start daily antihistamine  >>> Can start daily antihistamine such as Zyrtec, Allegra, Claritin >>>  Can choose generic >>>Choose 1 >>>Start taking daily  Restart flonase  >>> 1 to see 2 sprays each nostril daily as needed for allergy and nasal congestion  Refused flu vaccine   We recommend that you stop smoking.   Smoking Cessation Resources:  1 800 QUIT NOW  >>> Patient to call this resource and utilize it to help support her quit smoking >>> Keep up your hard work with stopping smoking  You can also contact the [facility] >>>For smoking cessation classes call [phone]  We do not recommend using e-cigarettes as a form of stopping smoking  >>> We have refilled your Chantix starting pack follow the instructions on the Dosepak  Follow-up in 8 weeks to show 30-day compliance with CPAP therapy   Allergic rhinitis  Start daily antihistamine  >>> Can start daily antihistamine such as Zyrtec, Allegra, Claritin >>>Can choose generic >>>Choose 1 >>>Start taking daily  Restart flonase  >>> 1 to see 2 sprays each nostril daily as needed for allergy and nasal congestion   Smoker Refused flu vaccine   We recommend that you stop smoking.   Smoking Cessation Resources:  1 800 QUIT NOW  >>> Patient to call this resource and utilize it to help support her quit smoking >>> Keep up your hard work with stopping smoking  You can also contact the [facility] >>>For smoking cessation classes call [phone]  We do not recommend using e-cigarettes as a form of stopping smoking  >>> We have refilled your Chantix starting pack follow the instructions on the [place] [facility] ([place]) Continue to work towards healthy weight  Preventative health care Please stop smoking  Work towards healthy weight  You refused flu vaccine today      Lauraine Rinne, NP 03/13/2018

## 2018-03-13 ENCOUNTER — Encounter: Payer: Self-pay | Admitting: Internal Medicine

## 2018-03-13 ENCOUNTER — Encounter: Payer: Self-pay | Admitting: Pulmonary Disease

## 2018-03-13 ENCOUNTER — Ambulatory Visit (INDEPENDENT_AMBULATORY_CARE_PROVIDER_SITE_OTHER): Payer: No Typology Code available for payment source | Admitting: Pulmonary Disease

## 2018-03-13 VITALS — BP 106/62 | HR 97 | Ht 74.0 in | Wt >= 6400 oz

## 2018-03-13 DIAGNOSIS — Z0000 Encounter for general adult medical examination without abnormal findings: Secondary | ICD-10-CM

## 2018-03-13 DIAGNOSIS — J309 Allergic rhinitis, unspecified: Secondary | ICD-10-CM

## 2018-03-13 DIAGNOSIS — J4540 Moderate persistent asthma, uncomplicated: Secondary | ICD-10-CM | POA: Diagnosis not present

## 2018-03-13 DIAGNOSIS — G4733 Obstructive sleep apnea (adult) (pediatric): Secondary | ICD-10-CM

## 2018-03-13 DIAGNOSIS — F17210 Nicotine dependence, cigarettes, uncomplicated: Secondary | ICD-10-CM

## 2018-03-13 DIAGNOSIS — F17200 Nicotine dependence, unspecified, uncomplicated: Secondary | ICD-10-CM

## 2018-03-13 MED ORDER — VARENICLINE TARTRATE 0.5 MG X 11 & 1 MG X 42 PO MISC: ORAL | 0 refills | Status: DC

## 2018-03-13 MED ORDER — ALBUTEROL SULFATE HFA 108 (90 BASE) MCG/ACT IN AERS: 2.0000 | INHALATION_SPRAY | Freq: Four times a day (QID) | RESPIRATORY_TRACT | 11 refills | Status: DC | PRN

## 2018-03-13 MED ORDER — BUDESONIDE-FORMOTEROL FUMARATE 80-4.5 MCG/ACT IN AERO: 2.0000 | INHALATION_SPRAY | Freq: Two times a day (BID) | RESPIRATORY_TRACT | 0 refills | Status: DC

## 2018-03-13 MED ORDER — FLUTICASONE PROPIONATE 50 MCG/ACT NA SUSP: 2.0000 | Freq: Every day | NASAL | 3 refills | Status: DC

## 2018-03-13 MED ORDER — VARENICLINE TARTRATE 1 MG PO TABS: 1.0000 mg | ORAL_TABLET | Freq: Two times a day (BID) | ORAL | 1 refills | Status: DC

## 2018-03-13 MED FILL — FLUTICASONE PROP 50 MCG SPR: 50 | 30 days supply | Qty: 16 | Fill #0

## 2018-03-13 MED FILL — CHANTIX STARTING MONTH BOX: 0.5 MG X 11 | 28 days supply | Qty: 53 | Fill #0

## 2018-03-13 NOTE — Assessment & Plan Note (Signed)
Refused flu vaccine   We recommend that you stop smoking.   Smoking Cessation Resources:  1 800 QUIT NOW  >>> Patient to call this resource and utilize it to help support her quit smoking >>> Keep up your hard work with stopping smoking  You can also contact the [facility] >>>For smoking cessation classes call [phone]  We do not recommend using e-cigarettes as a form of stopping smoking  >>> We have refilled your Chantix starting pack follow the instructions on the Dosepak

## 2018-03-13 NOTE — Patient Instructions (Addendum)
Restart Symbicort 80 >>> 2 puffs in the morning right when you wake up, rinse out your mouth after use, 12 hours later 2 puffs, rinse after use >>> Take this daily, no matter what >>> This is not a rescue inhaler   Pulmonary function test ordered at next OV in 8 weeks   [facility] >>> 12-15 autotitrating >>> Mask of choice  We recommend that you continue using your CPAP daily >>>Keep up the hard work using your device >>> Goal should be wearing this for the entire night that you are sleeping, at least 4 to 6 hours  Remember:  . Do not drive or operate heavy machinery if tired or drowsy.  . Please notify the supply company and office if you are unable to use your device regularly due to missing supplies or machine being broken.  . Work on maintaining a healthy weight and following your recommended nutrition plan  . Maintain proper daily exercise and movement  . Maintaining proper use of your device can also help improve management of other chronic illnesses such as: Blood pressure, blood sugars, and weight management.   BiPAP/ CPAP Cleaning:  >>>Clean weekly, with Dawn soap, and bottle brush.  Set up to air dry.   Start daily antihistamine  >>> Can start daily antihistamine such as Zyrtec, Allegra, Claritin >>>Can choose generic >>>Choose 1 >>>Start taking daily  Restart flonase  >>> 1 to see 2 sprays each nostril daily as needed for allergy and nasal congestion  Refused flu vaccine    We recommend that you stop smoking.   Smoking Cessation Resources:  1 800 QUIT NOW  >>> Patient to call this resource and utilize it to help support her quit smoking >>> Keep up your hard work with stopping smoking  You can also contact the [facility] >>>For smoking cessation classes call [phone]  We do not recommend using e-cigarettes as a form of stopping smoking  >>> We have refilled your Chantix starting pack follow the  instructions on the Dosepak   Follow-up in 8 weeks to show 30-day compliance with CPAP therapy  It is flu season:   >>>Remember to be washing your hands regularly, using hand sanitizer, be careful to use around herself with has contact with people who are sick will increase her chances of getting sick yourself. >>> Best ways to protect herself from the flu: Receive the yearly flu vaccine, practice good hand hygiene washing with soap and also using hand sanitizer when available, eat a nutritious meals, get adequate rest, hydrate appropriately   Please contact the office if your symptoms worsen or you have concerns that you are not improving.   Thank you for choosing [facility] for your healthcare, and for allowing [place] to partner with you on your healthcare journey. I am thankful to be able to provide care to you today.   Elisha Headland FNP-C     Sleep Apnea Sleep apnea is a condition that affects breathing. People with sleep apnea have moments during sleep when their breathing pauses briefly or gets shallow. Sleep apnea can cause these symptoms:  Trouble staying asleep.  Sleepiness or tiredness during the day.  Irritability.  Loud snoring.  Morning headaches.  Trouble concentrating.  Forgetting things.  Less interest in sex.  Being sleepy for no reason.  Mood swings.  Personality changes.  Depression.  Waking up a lot during the night to pee (urinate).  Dry  mouth.  Sore throat.  Follow these instructions at home:  Make any changes in your routine that your doctor recommends.  Eat a healthy, well-balanced diet.  Take over-the-counter and prescription medicines only as told by your doctor.  Avoid using alcohol, calming medicines (sedatives), and narcotic medicines.  Take steps to lose weight if you are overweight.  If you were given a machine (device) to use while you sleep, use it only as told by your doctor.  Do not use any tobacco products,  such as cigarettes, chewing tobacco, and e-cigarettes. If you need help quitting, ask your doctor.  Keep all follow-up visits as told by your doctor. This is important. Contact a doctor if:  The machine that you were given to use during sleep is uncomfortable or does not seem to be working.  Your symptoms do not get better.  Your symptoms get worse. Get help right away if:  Your chest hurts.  You have trouble breathing in enough air (shortness of breath).  You have an uncomfortable feeling in your back, arms, or stomach.  You have trouble talking.  One side of your body feels weak.  A part of your face is hanging down (drooping). These symptoms may be an emergency. Do not wait to see if the symptoms will go away. Get medical help right away. Call your local emergency services (911 in the [place] Do not drive yourself to the hospital. This information is not intended to replace advice given to you by your health care provider. Make sure you discuss any questions you have with your health care provider. Document Released: 03/02/2008 Document Revised: 01/18/2016 Document Reviewed: 03/03/2015 Elsevier Interactive Patient Education  [employer].

## 2018-03-13 NOTE — Assessment & Plan Note (Signed)
[facility] >>> 12-15 autotitrating >>> Mask of choice  We recommend that you continue using your CPAP daily >>>Keep up the hard work using your device >>> Goal should be wearing this for the entire night that you are sleeping, at least 4 to 6 hours  Remember:  . Do not drive or operate heavy machinery if tired or drowsy.  . Please notify the supply company and office if you are unable to use your device regularly due to missing supplies or machine being broken.  . Work on maintaining a healthy weight and following your recommended nutrition plan  . Maintain proper daily exercise and movement  . Maintaining proper use of your device can also help improve management of other chronic illnesses such as: Blood pressure, blood sugars, and weight management.   BiPAP/ CPAP Cleaning:  >>>Clean weekly, with Dawn soap, and bottle brush.  Set up to air dry.  We recommend that you stop smoking.   Follow-up in 8 weeks to show 30-day compliance with CPAP therapy

## 2018-03-13 NOTE — Addendum Note (Signed)
Addended by: Pilar Grammes on: 03/13/2018 02:22 PM   Modules accepted: Orders

## 2018-03-13 NOTE — Assessment & Plan Note (Signed)
Restart Symbicort 80 >>> 2 puffs in the morning right when you wake up, rinse out your mouth after use, 12 hours later 2 puffs, rinse after use >>> Take this daily, no matter what >>> This is not a rescue inhaler   Pulmonary function test ordered at next OV in 8 weeks   Start daily antihistamine  >>> Can start daily antihistamine such as Zyrtec, Allegra, Claritin >>>Can choose generic >>>Choose 1 >>>Start taking daily  Restart flonase  >>> 1 to see 2 sprays each nostril daily as needed for allergy and nasal congestion  Refused flu vaccine   We recommend that you stop smoking.   Smoking Cessation Resources:  1 800 QUIT NOW  >>> Patient to call this resource and utilize it to help support her quit smoking >>> Keep up your hard work with stopping smoking  You can also contact the [facility] >>>For smoking cessation classes call [phone]  We do not recommend using e-cigarettes as a form of stopping smoking  >>> We have refilled your Chantix starting pack follow the instructions on the Dosepak  Follow-up in 8 weeks to show 30-day compliance with CPAP therapy

## 2018-03-13 NOTE — Assessment & Plan Note (Signed)
Please stop smoking  Work towards healthy weight  You refused flu vaccine today

## 2018-03-13 NOTE — Assessment & Plan Note (Signed)
Continue to work towards healthy weight 

## 2018-03-13 NOTE — Assessment & Plan Note (Signed)
  Start daily antihistamine  >>> Can start daily antihistamine such as Zyrtec, Allegra, Claritin >>>Can choose generic >>>Choose 1 >>>Start taking daily  Restart flonase  >>> 1 to see 2 sprays each nostril daily as needed for allergy and nasal congestion

## 2018-03-13 NOTE — Telephone Encounter (Signed)
Done hardcopy to shirron 

## 2018-03-22 MED FILL — NYSTATIN 100,000 UNITS/ML S: 100000 | 10 days supply | Qty: 200 | Fill #1

## 2018-03-27 ENCOUNTER — Encounter: Payer: Self-pay | Admitting: Internal Medicine

## 2018-03-27 MED ORDER — CLOBETASOL PROPIONATE 0.05 % EX CREA: 1.0000 "application " | TOPICAL_CREAM | Freq: Two times a day (BID) | CUTANEOUS | 0 refills | Status: DC

## 2018-03-28 MED FILL — CLOBETASOL PROPIONATE 0.05: 0.05 | 7 days supply | Qty: 30 | Fill #0

## 2018-04-05 ENCOUNTER — Telehealth: Payer: Self-pay | Admitting: Pulmonary Disease

## 2018-04-05 DIAGNOSIS — G4733 Obstructive sleep apnea (adult) (pediatric): Secondary | ICD-10-CM

## 2018-04-05 NOTE — Telephone Encounter (Signed)
lmtcb for pt.  

## 2018-04-06 NOTE — Telephone Encounter (Signed)
Sorry to hear the patient is having this trouble.  Unfortunately on his CPAP compliance report from 03/07/2018 through 04/05/2018 showing the patient has only used it 3 days.  This does show that his pressure has remained around 14 with an AHI of 9.7.  This is only a minor improvement from his AHI of 21 on his sleep study.  We can offer to set him on a fixed pressure of 14.  Instead of the 12-15 that he is currently on.  As I discussed with him in his office visit wearing a CPAP is an adjustment and he will have to get used to the pressure.  He will need to wear it for more than 3 days.  Also make sure patient has follow-up with Dr. Reginia Naas first available to further discuss and troubleshoot his CPAP needs.  If patient refuses this therapy can present to our office for another office visit.  Please place the order for a fixed pressure 14.  Elisha Headland, FNP

## 2018-04-06 NOTE — Telephone Encounter (Signed)
Pt is calling back [phone]

## 2018-04-06 NOTE — Telephone Encounter (Signed)
Spoke with pt. He is aware of Brian's response. He is okay with changing his CPAP pressure. Order has been placed. Pt will back back if he continues having issues. Nothing further was needed.

## 2018-04-06 NOTE — Telephone Encounter (Signed)
ATC pt, no answer. Left message for pt to call back.  

## 2018-04-06 NOTE — Telephone Encounter (Signed)
Spoke with pt, he states he is having issues with his cpap. He hasn't gotten any sleep since, he couldn't exhale as if the pressure from the cpap was too strong. [facility] changed a setting on his machine and he states it feels better but he is still not sleeping due to waking up to catch his breath. He states this started happening when he started the Cpap. He describes it as he is hypoventilating and he is tired ang agitated. Larry Fowler can you look at download to see if you can advise on this.  His download is on B side counter.

## 2018-04-11 MED FILL — HYDROCODON-APAP 5-325: 5-325 | 3 days supply | Qty: 12 | Fill #0

## 2018-04-11 MED FILL — AMOXICILLIN 500 MG CAPSULE: 500 | 7 days supply | Qty: 21 | Fill #0

## 2018-04-13 ENCOUNTER — Telehealth: Payer: Self-pay | Admitting: Pulmonary Disease

## 2018-04-13 DIAGNOSIS — G4733 Obstructive sleep apnea (adult) (pediatric): Secondary | ICD-10-CM

## 2018-04-13 NOTE — Telephone Encounter (Signed)
Called spoke with patient Larry Drone NP okayed to change pressure settings back to auto 12-15cm PFT and office visit with Arlys [name] NP for 12.2.19 changed to 12.16.19 with RA for 1100 PFT and 12N appt  Order sent to [facility] Nothing further needed at this time; will sign off

## 2018-04-13 NOTE — Telephone Encounter (Signed)
Okay to adjust pressures back.  Make sure that patient's next follow-up is with Dr. Vassie Loll.  Elisha Headland, FNP

## 2018-04-13 NOTE — Telephone Encounter (Signed)
Called and spoke to patient, patient states he is not able to sleep due to cpap pressures being too high. Patient states when he previously called the pressure was too high and he was not able to fully exhale and he was hyperventilating. He called [facility] and they adjusted his ERP and that fixed the problem. Since his pressure has been fixed at 14cm patient cannot sleep. Patient is requesting that his pressure be placed back to 12-15cm. Informed patient we would discuss with a provider and call him back.   BM please advise if okay to place pressures back to Auto  12-15cm for patient.

## 2018-04-17 MED FILL — VENTOLIN HFA 90 MCG INHALER: 108 (90 BAS | 25 days supply | Qty: 18 | Fill #0

## 2018-04-17 MED FILL — SYMBICORT 80-4.5 MCG INH: 80-4.5 | 30 days supply | Qty: 10 | Fill #1

## 2018-05-03 ENCOUNTER — Other Ambulatory Visit: Payer: Self-pay | Admitting: Internal Medicine

## 2018-05-05 MED FILL — NAPROXEN 500 MG TABLET: 500 | 50 days supply | Qty: 100 | Fill #0

## 2018-05-08 ENCOUNTER — Ambulatory Visit: Payer: Self-pay | Admitting: Pulmonary Disease

## 2018-05-08 ENCOUNTER — Telehealth: Payer: Self-pay | Admitting: Pulmonary Disease

## 2018-05-08 DIAGNOSIS — G4733 Obstructive sleep apnea (adult) (pediatric): Secondary | ICD-10-CM

## 2018-05-08 NOTE — Telephone Encounter (Signed)
Called and spoke with Patient.  He stated that he did not get a new mask when he got his CPAP machine, because he had several mask from the hospital.  He stated that he can not get a mask to fit him properly.  DME is [facility].  Per last OV 03/13/18, with Elisha HeadlandBrian Mack, NP, note - [facility] >>> 12-15 autotitrating >>> Mask of choice New order placed for CPAP mask of choice. Nothing further at this time.

## 2018-05-11 MED FILL — SYMBICORT 80-4.5 MCG INH: 80-4.5 | 30 days supply | Qty: 10 | Fill #2

## 2018-05-15 ENCOUNTER — Encounter: Payer: Self-pay | Admitting: Internal Medicine

## 2018-05-16 MED ORDER — LOSARTAN POTASSIUM 25 MG PO TABS: 25.0000 mg | ORAL_TABLET | Freq: Two times a day (BID) | ORAL | 3 refills | Status: DC

## 2018-05-16 MED FILL — LOSARTAN POTASSIUM 25 MG TA: 25 | 90 days supply | Qty: 180 | Fill #0

## 2018-05-22 ENCOUNTER — Encounter: Payer: Self-pay | Admitting: Pulmonary Disease

## 2018-05-22 ENCOUNTER — Ambulatory Visit (INDEPENDENT_AMBULATORY_CARE_PROVIDER_SITE_OTHER): Payer: No Typology Code available for payment source | Admitting: Pulmonary Disease

## 2018-05-22 ENCOUNTER — Other Ambulatory Visit: Payer: Self-pay | Admitting: Pulmonary Disease

## 2018-05-22 DIAGNOSIS — J4530 Mild persistent asthma, uncomplicated: Secondary | ICD-10-CM

## 2018-05-22 DIAGNOSIS — G4733 Obstructive sleep apnea (adult) (pediatric): Secondary | ICD-10-CM

## 2018-05-22 DIAGNOSIS — J4540 Moderate persistent asthma, uncomplicated: Secondary | ICD-10-CM | POA: Diagnosis not present

## 2018-05-22 LAB — PULMONARY FUNCTION TEST
DL/VA % pred: 109 %
DL/VA: 5.3 ml/min/mmHg/L
DLCO unc % pred: 69 %
DLCO unc: 26.22 ml/min/mmHg
FEF 25-75 Post: 2.27 L/sec
FEF 25-75 Pre: 1.7 L/sec
FEF2575-%Change-Post: 33 %
FEF2575-%Pred-Post: 55 %
FEF2575-%Pred-Pre: 41 %
FEV1-%Change-Post: 11 %
FEV1-%Pred-Post: 56 %
FEV1-%Pred-Pre: 50 %
FEV1-Post: 2.59 L
FEV1-Pre: 2.32 L
FEV1FVC-%Change-Post: 4 %
FEV1FVC-%Pred-Pre: 89 %
FEV6-%Change-Post: 7 %
FEV6-%Pred-Post: 61 %
FEV6-%Pred-Pre: 57 %
FEV6-Post: 3.51 L
FEV6-Pre: 3.27 L
FEV6FVC-%Change-Post: 0 %
FEV6FVC-%Pred-Post: 102 %
FEV6FVC-%Pred-Pre: 103 %
FVC-%Change-Post: 7 %
FVC-%Pred-Post: 60 %
FVC-%Pred-Pre: 56 %
FVC-Post: 3.52 L
FVC-Pre: 3.29 L
Post FEV1/FVC ratio: 73 %
Post FEV6/FVC ratio: 100 %
Pre FEV1/FVC ratio: 70 %
Pre FEV6/FVC Ratio: 100 %

## 2018-05-22 MED ORDER — BUDESONIDE-FORMOTEROL FUMARATE 80-4.5 MCG/ACT IN AERO: 2.0000 | INHALATION_SPRAY | Freq: Two times a day (BID) | RESPIRATORY_TRACT | 6 refills | Status: DC

## 2018-05-22 NOTE — Progress Notes (Signed)
   Subjective:    Patient ID: Larry Fowler, male    DOB: 02/14/1971, 47 y.o.   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 He works as a Research scientist (medical)dog groomer for 20 years . Asthma was diagnosed a few years ago  He has a history of substance abuse, quit cocaine many years ago, smokes marijuana about 2-3 times a week, smokes tobacco a pack lasting about 3 days-about 15 pack years.   Chief Complaint  Patient presents with  . Follow-up    For PFT results. Per patient, he is still having dizzy spells. Did not notice this until after first week of using CPAP.  Dizziness spells will last for a few secs and then go ahead.     He has been using Symbicort as recommended and breathing has been doing better.  He still feels that symptoms are better on weekend and wonders if dog grooming has anything to do with his symptoms. He has been unable to lose weight, is thinking of undergoing bariatric evaluation.  He is settling down with a CPAP machine, air fit F 30 medium mask.  No problems with pressure.  Definitely feels more rested and denies dryness. CPAP download was reviewed which shows better compliance 4.5 hours every night, residual AHI 4.5/hour with mild leak and average pressure of 14 cm He would like to obtain a large full facemask but is having problems obtaining this from DME.  PFTs were reviewed today  Significant tests/ events reviewed  PFTs 05/2018 ratio 73, FEV1 56%, FVC 60% >> moderate restriction, TLC 68%, DLCO 69% but corrects for alveolar volume  >> likely obesity related Spirometry 04/2017  ratio of 67, FEV1 of 48% and FVC of 56% suggesting moderate to severe airway obstruction  RAST 04/2017 neg,  IgE 46  Eos 200  HST 05/2017 AHI 21/h 01/08/2018-CPAP titration- total sleep time 163 minutes, optimal CPAP pressure 14, >>>Recommend trial of auto CPAP 12-15  Review of Systems neg for any significant sore throat, dysphagia, itching,  sneezing, nasal congestion or excess/ purulent secretions, fever, chills, sweats, unintended wt loss, pleuritic or exertional cp, hempoptysis, orthopnea pnd or change in chronic leg swelling. Also denies presyncope, palpitations, heartburn, abdominal pain, nausea, vomiting, diarrhea or change in bowel or urinary habits, dysuria,hematuria, rash, arthralgias, visual complaints, headache, numbness weakness or ataxia.     Objective:   Physical Exam   Gen. Pleasant, obese, in no distress ENT - no lesions, no post nasal drip Neck: No JVD, no thyromegaly, no carotid bruits Lungs: no use of accessory muscles, no dullness to percussion, decreased without rales or rhonchi  Cardiovascular: Rhythm regular, heart sounds  normal, no murmurs or gallops, no peripheral edema Musculoskeletal: No deformities, no cyanosis or clubbing , no tremors        Assessment & Plan:

## 2018-05-22 NOTE — Assessment & Plan Note (Signed)
Auto CPAP settings seem to be working well, these are controlling his events.  He is certainly feeling better rested and has more energy. Hopefully can work on losing weight and eventually undergo bariatric evaluation. He will try to get a large full facemask online   Weight loss encouraged, compliance with goal of at least 4-6 hrs every night is the expectation. Advised against medications with sedative side effects Cautioned against driving when sleepy - understanding that sleepiness will vary on a day to day basis

## 2018-05-22 NOTE — Addendum Note (Signed)
Addended by: Jacquiline DoeROGDON, Olney Monier M on: 05/22/2018 01:08 PM   Modules accepted: Orders

## 2018-05-22 NOTE — Assessment & Plan Note (Signed)
Continue Symbicort 82 puffs twice daily for the next 6 months and then reassess. 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

## 2018-05-22 NOTE — Patient Instructions (Signed)
CPAP supplies will be renewed. Try to look online for a large full facemask. Refills on Symbicort

## 2018-05-22 NOTE — Progress Notes (Signed)
PFT done today. 

## 2018-06-05 ENCOUNTER — Encounter: Payer: Self-pay | Admitting: Internal Medicine

## 2018-06-05 DIAGNOSIS — H9209 Otalgia, unspecified ear: Secondary | ICD-10-CM

## 2018-06-05 MED FILL — VENTOLIN HFA 90 MCG INHALER: 108 (90 BAS | 25 days supply | Qty: 18 | Fill #1

## 2018-06-05 NOTE — Telephone Encounter (Signed)
See above note to pt

## 2018-06-15 NOTE — Telephone Encounter (Signed)
This MyChart encounter was inadvertently sent to the incorrect pool and therefor was not able to be seen by Pulmonary. Pt has been seen by Pulmonary on 12.16.19. Will sign off.  

## 2018-06-20 ENCOUNTER — Other Ambulatory Visit: Payer: Self-pay | Admitting: Internal Medicine

## 2018-06-20 DIAGNOSIS — R739 Hyperglycemia, unspecified: Secondary | ICD-10-CM

## 2018-06-20 DIAGNOSIS — I10 Essential (primary) hypertension: Secondary | ICD-10-CM

## 2018-06-21 MED FILL — SYMBICORT 80-4.5 MCG INH: 80-4.5 | 30 days supply | Qty: 10 | Fill #3

## 2018-06-28 MED FILL — VENTOLIN HFA 90 MCG INHALER: 108 (90 BAS | 25 days supply | Qty: 18 | Fill #2

## 2018-07-03 ENCOUNTER — Encounter: Payer: No Typology Code available for payment source | Attending: Internal Medicine | Admitting: Registered"

## 2018-07-03 ENCOUNTER — Telehealth: Payer: Self-pay | Admitting: Internal Medicine

## 2018-07-03 DIAGNOSIS — Z79899 Other long term (current) drug therapy: Secondary | ICD-10-CM | POA: Insufficient documentation

## 2018-07-03 DIAGNOSIS — Z713 Dietary counseling and surveillance: Secondary | ICD-10-CM | POA: Insufficient documentation

## 2018-07-03 NOTE — Telephone Encounter (Signed)
PCP not did any lab work on this pt since 2018. Must have had them drawn at a different office. Can not advise. I have informed pt to speak with other physicians that he has seen within the last year. He expressed understanding.

## 2018-07-03 NOTE — Progress Notes (Signed)
Medical Nutrition Therapy:  Appt start time: 1410 end time:  1510.  Assessment:  Primary concerns today: Patient states he has a BMI of 67 and wants to lose weight. Pt states he attended an information event for bariatric surgery from [facility] and decided that he wants to see if he can lose weight on his own instead of going through surgery. Pt states if he did decide to get surgery he would have to wait a year before starting the process because his insurance policy has just started.   Pt states he usually doesn't go to doctor, but now that he has insurance, for his last birthday ([birth year]) he went for a wellness visit to make he doesn't have diabetes or heart problems. Pt states his BP is slightly elevated but other than that does not have any major health issues. Pt reports he quit smoking cigarettes about 6 weeks, no nicotine.  Pt reports back pain is his main complaint and feels he exercise is limited due to his weight. Pt states he doesn't know if he is making healthy choices or how much he should be eating. Pt states he has always been big and since he got married (Oct 2016?) he has gained more than 50 lbs.   Per chart A1c is 6.7% with FBG 133 mg/dL. Referral code for this visit included hyperglycemia, but RD did not bing up the topic because patient states he does not have an issue with his blood sugar and RD did see not pre-diabetes nor diabetes in Patient's problem list. RD contacted patient's PCP office, and left a message letting them know I did not feel comfortable breaking the news especially without a diagnosis in epic.   Most of today's visit was focused on the basics of healthy eating and ideas for cooking balanced meals at home.    Pt states only has coffee in the morning, not hungry and doesn't want to eat. Pt reports dinner had been at 9 or 10 pm until last couple of weeks. Pt states now they are eating closer to 5 pm with the only change is he is taking wife to work and  after picking her up after work he is really hungry.  Pt states his wife thinks she has mulitple food allergies and only eats a limited variety of foods and prefers fast food. Pt reports he needs variety, been all over the world and enjoys different foods and that he is going to start taking care of his dietary needs instead of following just what she eats.   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 Pt states his wife packs lunch for him and he snacks on it throughout the day in between groomings.   Sleep: goes to sleep midnight, since getting CPAP in October gets ~4-6 hrs/night, before that was hardly sleeping, and is amazed at how much better he feels with more rest.  Preferred Learning Style:   No preference indicated   Learning Readiness:   Ready  MEDICATIONS: reviewed   DIETARY INTAKE:  Usual eating pattern includes 1 meal and snacks on packed lunch throughout afternoon.  Everyday foods include coffee.     24-hr recall:  B ( AM): coffee, sugar & cream  Snk ( AM): couple bites of sandwich, gogurt  L (2 PM): PB & local clove honey sandwich, 2 gogurts, mozzarella cheese stick, box choc covered rasins, natural valley granola bar, fig newtons Snk ( PM): small bag Doritos on way home D (5-6 PM):  sushi OR a lot of red meat, potato, broccoli (big fan)  OR baked fish, fried oysters, clam chowder, sweet tea Snk ( PM): none Beverages: coconut water with pulp (really enjoys), coffee, water with propel (hates plain water)  Usual physical activity: ADL. Pt states he can't do anything due to back pain  Estimated energy needs: 3800-4200 calories  Progress Towards Goal(s):  New goal.   Nutritional Diagnosis:  NI-5.8.5 Inadeqate fiber intake As related to limited vegetables and whole grains.  As evidenced by dietary recall.    Intervention:  Nutrition Education. Discussed MyPlate. Discussed basic principles of plant-based eating. Hit briefly on value of breakfast. Importance on sleep  for overall health, metabolism, energy.  Plan: Consider the tips on the Sleep Hygiene handout, getting closer to 8 hours sleep would be a good goal. Aim to drink more water until your urine is light color Aim to eat balanced meals Consider eating breakfast Aim to eat vegetables at lunch and dinner; baby carrots would be a simple addition to your lunch bag Rethink your drink - instead of sweet tea, consider herbal teas, drop-in electrolytes or vitamins  Teaching Method Utilized:  Visual Auditory  Handouts given during visit include:  MyPlate  Foods list for MyPlate  Sleep Hygiene  Barriers to learning/adherence to lifestyle change: none  Demonstrated degree of understanding via:  Teach Back   Monitoring/Evaluation:  Dietary intake, exercise, and body weight in 3 week(s).

## 2018-07-03 NOTE — Telephone Encounter (Signed)
Copied from CRM [id number]. Topic: Referral - Question >> Jul 03, 2018  3:43 PM Jay Schlichter wrote: Reason for CRM: Heywood Bene [facility]- she saw pt and he told her he didn't know that his a1c was 6.1, he has not been told anything about this.  Please call pt and make sure he is aware of his labwork, nutritionist didn't want to be the one to tell pt his labs were high

## 2018-07-03 NOTE — Patient Instructions (Signed)
Consider the tips on the Sleep Hygiene handout, getting closer to 8 hours sleep would be a good goal. Aim to drink more water until your urine is light color Aim to eat balanced meals Consider eating breakfast Aim to eat vegetables at lunch and dinner; baby carrots would be a simple addition to your lunch bag Rethink your drink - instead of sweet tea, consider herbal teas, drop-in electrolytes or vitamins

## 2018-07-12 MED FILL — NAPROXEN 500 MG TABLET: 500 | 50 days supply | Qty: 100 | Fill #1

## 2018-07-14 MED FILL — SYMBICORT 80-4.5 MCG INH: 80-4.5 | 30 days supply | Qty: 10 | Fill #4

## 2018-07-17 MED FILL — VENTOLIN HFA 90 MCG INHALER: 108 (90 BAS | 25 days supply | Qty: 18 | Fill #3

## 2018-07-20 ENCOUNTER — Emergency Department (HOSPITAL_COMMUNITY): Payer: No Typology Code available for payment source

## 2018-07-20 ENCOUNTER — Encounter (HOSPITAL_COMMUNITY): Payer: Self-pay | Admitting: Emergency Medicine

## 2018-07-20 ENCOUNTER — Inpatient Hospital Stay (HOSPITAL_COMMUNITY)
Admission: EM | Admit: 2018-07-20 | Discharge: 2018-08-01 | DRG: 488 | Disposition: A | Discharge status: Rehabilitation Facility | Payer: No Typology Code available for payment source | Attending: Internal Medicine | Admitting: Internal Medicine

## 2018-07-20 DIAGNOSIS — E1165 Type 2 diabetes mellitus with hyperglycemia: Secondary | ICD-10-CM | POA: Diagnosis present

## 2018-07-20 DIAGNOSIS — G8918 Other acute postprocedural pain: Secondary | ICD-10-CM

## 2018-07-20 DIAGNOSIS — Z6841 Body Mass Index (BMI) 40.0 and over, adult: Secondary | ICD-10-CM | POA: Diagnosis not present

## 2018-07-20 DIAGNOSIS — H6693 Otitis media, unspecified, bilateral: Secondary | ICD-10-CM | POA: Diagnosis not present

## 2018-07-20 DIAGNOSIS — H6690 Otitis media, unspecified, unspecified ear: Secondary | ICD-10-CM | POA: Diagnosis not present

## 2018-07-20 DIAGNOSIS — S76111A Strain of right quadriceps muscle, fascia and tendon, initial encounter: Secondary | ICD-10-CM | POA: Diagnosis present

## 2018-07-20 DIAGNOSIS — L89311 Pressure ulcer of right buttock, stage 1: Secondary | ICD-10-CM | POA: Diagnosis not present

## 2018-07-20 DIAGNOSIS — E46 Unspecified protein-calorie malnutrition: Secondary | ICD-10-CM | POA: Diagnosis not present

## 2018-07-20 DIAGNOSIS — L8990 Pressure ulcer of unspecified site, unspecified stage: Secondary | ICD-10-CM

## 2018-07-20 DIAGNOSIS — S76111D Strain of right quadriceps muscle, fascia and tendon, subsequent encounter: Secondary | ICD-10-CM | POA: Diagnosis not present

## 2018-07-20 DIAGNOSIS — G4733 Obstructive sleep apnea (adult) (pediatric): Secondary | ICD-10-CM | POA: Diagnosis present

## 2018-07-20 DIAGNOSIS — F17210 Nicotine dependence, cigarettes, uncomplicated: Secondary | ICD-10-CM | POA: Diagnosis present

## 2018-07-20 DIAGNOSIS — Z79899 Other long term (current) drug therapy: Secondary | ICD-10-CM

## 2018-07-20 DIAGNOSIS — M25512 Pain in left shoulder: Secondary | ICD-10-CM | POA: Diagnosis present

## 2018-07-20 DIAGNOSIS — D62 Acute posthemorrhagic anemia: Secondary | ICD-10-CM | POA: Diagnosis not present

## 2018-07-20 DIAGNOSIS — F329 Major depressive disorder, single episode, unspecified: Secondary | ICD-10-CM | POA: Diagnosis present

## 2018-07-20 DIAGNOSIS — F339 Major depressive disorder, recurrent, unspecified: Secondary | ICD-10-CM | POA: Diagnosis not present

## 2018-07-20 DIAGNOSIS — E119 Type 2 diabetes mellitus without complications: Secondary | ICD-10-CM | POA: Diagnosis not present

## 2018-07-20 DIAGNOSIS — S76119A Strain of unspecified quadriceps muscle, fascia and tendon, initial encounter: Secondary | ICD-10-CM | POA: Diagnosis present

## 2018-07-20 DIAGNOSIS — F1210 Cannabis abuse, uncomplicated: Secondary | ICD-10-CM | POA: Diagnosis present

## 2018-07-20 DIAGNOSIS — Y92009 Unspecified place in unspecified non-institutional (private) residence as the place of occurrence of the external cause: Secondary | ICD-10-CM

## 2018-07-20 DIAGNOSIS — J4530 Mild persistent asthma, uncomplicated: Secondary | ICD-10-CM | POA: Diagnosis not present

## 2018-07-20 DIAGNOSIS — K5900 Constipation, unspecified: Secondary | ICD-10-CM | POA: Diagnosis not present

## 2018-07-20 DIAGNOSIS — R0989 Other specified symptoms and signs involving the circulatory and respiratory systems: Secondary | ICD-10-CM | POA: Diagnosis not present

## 2018-07-20 DIAGNOSIS — J45909 Unspecified asthma, uncomplicated: Secondary | ICD-10-CM | POA: Diagnosis present

## 2018-07-20 DIAGNOSIS — W010XXA Fall on same level from slipping, tripping and stumbling without subsequent striking against object, initial encounter: Secondary | ICD-10-CM | POA: Diagnosis present

## 2018-07-20 DIAGNOSIS — S76112A Strain of left quadriceps muscle, fascia and tendon, initial encounter: Secondary | ICD-10-CM | POA: Diagnosis present

## 2018-07-20 DIAGNOSIS — S76112S Strain of left quadriceps muscle, fascia and tendon, sequela: Secondary | ICD-10-CM | POA: Diagnosis not present

## 2018-07-20 DIAGNOSIS — S76119D Strain of unspecified quadriceps muscle, fascia and tendon, subsequent encounter: Secondary | ICD-10-CM | POA: Diagnosis not present

## 2018-07-20 DIAGNOSIS — Z7951 Long term (current) use of inhaled steroids: Secondary | ICD-10-CM | POA: Diagnosis not present

## 2018-07-20 DIAGNOSIS — F331 Major depressive disorder, recurrent, moderate: Secondary | ICD-10-CM | POA: Diagnosis not present

## 2018-07-20 DIAGNOSIS — Z89021 Acquired absence of right finger(s): Secondary | ICD-10-CM

## 2018-07-20 DIAGNOSIS — Z720 Tobacco use: Secondary | ICD-10-CM

## 2018-07-20 DIAGNOSIS — K5903 Drug induced constipation: Secondary | ICD-10-CM | POA: Diagnosis not present

## 2018-07-20 DIAGNOSIS — Z833 Family history of diabetes mellitus: Secondary | ICD-10-CM | POA: Diagnosis not present

## 2018-07-20 DIAGNOSIS — G479 Sleep disorder, unspecified: Secondary | ICD-10-CM | POA: Diagnosis not present

## 2018-07-20 DIAGNOSIS — H938X3 Other specified disorders of ear, bilateral: Secondary | ICD-10-CM | POA: Diagnosis not present

## 2018-07-20 DIAGNOSIS — S76111S Strain of right quadriceps muscle, fascia and tendon, sequela: Secondary | ICD-10-CM | POA: Diagnosis not present

## 2018-07-20 DIAGNOSIS — I10 Essential (primary) hypertension: Secondary | ICD-10-CM | POA: Diagnosis present

## 2018-07-20 DIAGNOSIS — S76119S Strain of unspecified quadriceps muscle, fascia and tendon, sequela: Secondary | ICD-10-CM | POA: Diagnosis not present

## 2018-07-20 DIAGNOSIS — M7989 Other specified soft tissue disorders: Secondary | ICD-10-CM | POA: Diagnosis not present

## 2018-07-20 DIAGNOSIS — R739 Hyperglycemia, unspecified: Secondary | ICD-10-CM | POA: Diagnosis not present

## 2018-07-20 DIAGNOSIS — Z791 Long term (current) use of non-steroidal anti-inflammatories (NSAID): Secondary | ICD-10-CM | POA: Diagnosis not present

## 2018-07-20 DIAGNOSIS — F419 Anxiety disorder, unspecified: Secondary | ICD-10-CM | POA: Diagnosis present

## 2018-07-20 DIAGNOSIS — J029 Acute pharyngitis, unspecified: Secondary | ICD-10-CM | POA: Diagnosis not present

## 2018-07-20 DIAGNOSIS — R7309 Other abnormal glucose: Secondary | ICD-10-CM | POA: Diagnosis not present

## 2018-07-20 DIAGNOSIS — L299 Pruritus, unspecified: Secondary | ICD-10-CM | POA: Diagnosis not present

## 2018-07-20 LAB — CBC WITH DIFFERENTIAL/PLATELET
Abs Immature Granulocytes: 0.04 10*3/uL (ref 0.00–0.07)
BASOS PCT: 0 %
Basophils Absolute: 0 10*3/uL (ref 0.0–0.1)
Eosinophils Absolute: 0.2 10*3/uL (ref 0.0–0.5)
Eosinophils Relative: 1 %
HCT: 41.6 % (ref 39.0–52.0)
Hemoglobin: 13.1 g/dL (ref 13.0–17.0)
Immature Granulocytes: 0 %
Lymphocytes Relative: 34 %
Lymphs Abs: 3.6 10*3/uL (ref 0.7–4.0)
MCH: 30.3 pg (ref 26.0–34.0)
MCHC: 31.5 g/dL (ref 30.0–36.0)
MCV: 96.1 fL (ref 80.0–100.0)
Monocytes Absolute: 0.6 10*3/uL (ref 0.1–1.0)
Monocytes Relative: 6 %
Neutro Abs: 6.3 10*3/uL (ref 1.7–7.7)
Neutrophils Relative %: 59 %
Platelets: 164 10*3/uL (ref 150–400)
RBC: 4.33 MIL/uL (ref 4.22–5.81)
RDW: 14.1 % (ref 11.5–15.5)
WBC: 10.8 10*3/uL — ABNORMAL HIGH (ref 4.0–10.5)
nRBC: 0 % (ref 0.0–0.2)

## 2018-07-20 LAB — BASIC METABOLIC PANEL
ANION GAP: 12 (ref 5–15)
BUN: 14 mg/dL (ref 6–20)
CO2: 25 mmol/L (ref 22–32)
Calcium: 9 mg/dL (ref 8.9–10.3)
Chloride: 100 mmol/L (ref 98–111)
Creatinine, Ser: 0.63 mg/dL (ref 0.61–1.24)
GFR calc Af Amer: >60 mL/min (ref >60)
GFR calc non Af Amer: >60 mL/min (ref >60)
Glucose, Bld: 157 mg/dL — ABNORMAL HIGH (ref 70–99)
Potassium: 3.9 mmol/L (ref 3.5–5.1)
Sodium: 137 mmol/L (ref 135–145)

## 2018-07-20 MED ORDER — NAPROXEN 250 MG PO TABS: 500.0000 mg | ORAL_TABLET | Freq: Once | ORAL | Status: DC

## 2018-07-20 MED ORDER — ONDANSETRON 4 MG PO TBDP
4.0000 mg | ORAL_TABLET | Freq: Once | ORAL | Status: AC
  Administered 2018-07-20: 4 mg via ORAL
  Filled 2018-07-20: qty 1

## 2018-07-20 MED ORDER — NAPROXEN 250 MG PO TABS
500.0000 mg | ORAL_TABLET | Freq: Once | ORAL | Status: AC
  Administered 2018-07-20: 500 mg via ORAL
  Filled 2018-07-20: qty 2

## 2018-07-20 MED ORDER — KETOROLAC TROMETHAMINE 60 MG/2ML IM SOLN
60.0000 mg | Freq: Once | INTRAMUSCULAR | Status: DC
  Filled 2018-07-20: qty 2

## 2018-07-20 MED ORDER — MORPHINE SULFATE (PF) 4 MG/ML IV SOLN
4.0000 mg | Freq: Once | INTRAVENOUS | Status: AC
  Administered 2018-07-20: 4 mg via INTRAMUSCULAR
  Filled 2018-07-20: qty 1

## 2018-07-20 MED ORDER — OXYCODONE-ACETAMINOPHEN 5-325 MG PO TABS
1.0000 | ORAL_TABLET | Freq: Once | ORAL | Status: AC
  Administered 2018-07-20: 1 via ORAL
  Filled 2018-07-20: qty 1

## 2018-07-20 NOTE — ED Provider Notes (Addendum)
[facility] EMERGENCY DEPARTMENT Provider Note   CSN: [id number] Arrival date & time: 07/20/18  1422     History   Chief Complaint Chief Complaint  Patient presents with  . Joint Swelling    HPI Larry Fowler is a 48 y.o. male with a hx of tobacco abuse, depression, anxiety, cocaine use, and prior L knee ACL repair who presents to the ED s/p mechanical fall just PTA with complaints of bilateral knee pain. Patient states he was ambulating on a wet floor, slipped, and describes "feet hitting his butt" seems consistent with hyperflexed bilateral knees & fell backwards. Denies head injury or LOC. He was unable to get up on his own therefore he called EMS. States he is having pain only to the knees, L>R, current pain is severe, worse with movement, no alleviating factors. Took 800 mg of motrin PTA. Denies numbness, tingling, or weakness.   HPI  Past Medical History:  Diagnosis Date  . ALLERGIC RHINITIS 09/12/2007   Qualifier: Diagnosis of  By: Jonny Ruiz MD, Len Blalock   . ANXIETY 02/16/2007   Qualifier: Diagnosis of  By: Tyrone Apple, Lucy    . ASTHMA 12/17/2009   Qualifier: Diagnosis of  By: Jonny Ruiz MD, Len Blalock   . Cannabis abuse    currently  . Chills with fever   . DEPRESSION 09/12/2007   Qualifier: Diagnosis of  By: Jonny Ruiz MD, Len Blalock   . History of cocaine use    in his 68's  . Hyperglycemia   . Rectal fistula 2009  . Rectal pain   . Renal stone    2008    Patient Active Problem List   Diagnosis Date Noted  . Nutritional counseling 07/03/2018  . Disorder of tendon of left biceps 12/05/2017  . Hypogonadism in male 10/25/2017  . Hyperglycemia   . Preventative health care 03/21/2017  . Morbid obesity (HCC) 03/21/2017  . OSA (obstructive sleep apnea) 03/21/2017  . Chronic pain 03/21/2017  . Smoker 03/21/2017  . HTN (hypertension) 03/21/2017  . Fatigue 03/21/2017  . Left inguinal hernia 03/21/2017  . Rash 03/21/2017  . History of cocaine use   . Cannabis abuse     . Renal stone   . Anal abscess, ? fistula 04/05/2011  . Asthma 12/17/2009  . 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 DEPRESSION 09/12/2007  . Allergic rhinitis 09/12/2007  . ANXIETY 02/16/2007    Past Surgical History:  Procedure Laterality Date  . FINGER SURGERY  2008/09?   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 knee surgury     ? which knee per Dr Thurston Hole in his teens  . TONSILLECTOMY          Home Medications    Prior to Admission medications   Medication Sig Start Date End Date Taking? Authorizing Provider  albuterol (VENTOLIN HFA) 108 (90 Base) MCG/ACT inhaler Inhale 2 puffs into the lungs every 6 (six) hours as needed for wheezing. 03/13/18   Coral Ceo, NP  budesonide-formoterol (SYMBICORT) 80-4.5 MCG/ACT inhaler Inhale 2 puffs into the lungs 2 (two) times daily. 05/22/18   Oretha Milch, MD  clobetasol cream (TEMOVATE) 0.05 % Apply 1 application topically 2 (two) times daily. 03/27/18   Pincus Sanes, MD  Diclofenac Sodium 2 % SOLN Place 2 g onto the skin 2 (two) times daily. 12/05/17   Judi Saa, DO  fluticasone (FLONASE) 50 MCG/ACT nasal spray Place 2  sprays into both nostrils daily. 03/13/18   Coral Ceo, NP  losartan (COZAAR) 25 MG tablet Take 1 tablet (25 mg total) by mouth 2 (two) times daily. 05/16/18   [name], MD  naproxen (NAPROSYN) 500 MG tablet TAKE 1 TABLET BY MOUTH TWICE DAILY WITH A MEAL 05/05/18   [name], MD  Testosterone 20 % CREA Apply 1.25 g topically daily. 10/25/17   [name], MD  varenicline (CHANTIX STARTING MONTH PAK) 0.5 MG X 11 & 1 MG X 42 tablet Take one 0.5 mg tablet by mouth once daily for 3 days, then increase to one 0.5 mg tablet twice daily for 4 days, then increase to one 1 mg tablet twice daily. 03/13/18   Coral Ceo, NP    Family History Family History  Problem Relation Age of Onset  . Diabetes type II Mother   . Pneumonia Father     Social  History Social History   Tobacco Use  . Smoking status: Current Every Day Smoker    Packs/day: 0.25    Last attempt to quit: 03/01/2011    Years since quitting: 7.3  . Smokeless tobacco: Never Used  . Tobacco comment: doesn't smoke the whole cig, burns out while playing video game  Substance Use Topics  . Alcohol use: Yes  . Drug use: Yes    Types: Marijuana     Allergies   Patient has no known allergies.   Review of Systems Review of Systems  Constitutional: Negative for chills and fever.  Respiratory: Negative for shortness of breath.   Cardiovascular: Negative for chest pain.  Musculoskeletal: Positive for arthralgias. Negative for back pain and neck pain.  Neurological: Negative for dizziness, syncope, weakness, light-headedness and numbness.  All other systems reviewed and are negative.    Physical Exam Updated Vital Signs BP (!) 142/94 (BP Location: Right Wrist)   Pulse 72   Temp 98.3 F (36.8 C) (Oral)   Resp 16   SpO2 100%   Physical Exam Vitals signs and nursing note reviewed.  Constitutional:      General: He is not in acute distress.    Appearance: He is well-developed. He is obese. He is not toxic-appearing.  HENT:     Head: Normocephalic and atraumatic.     Comments: No racoon eyes or battle sign.  Eyes:     General:        Right eye: No discharge.        Left eye: No discharge.     Conjunctiva/sclera: Conjunctivae normal.  Neck:     Musculoskeletal: Normal range of motion and neck supple.     Comments: No midline cervical tenderness or palpable step off.  Cardiovascular:     Rate and Rhythm: Normal rate and regular rhythm.  Pulmonary:     Effort: Pulmonary effort is normal. No respiratory distress.     Breath sounds: Normal breath sounds. No wheezing, rhonchi or rales.  Chest:     Chest wall: No tenderness.  Abdominal:     General: There is no distension.     Palpations: Abdomen is soft.     Tenderness: There is no abdominal tenderness.    Musculoskeletal:     Comments: No obvious deformity, erythema, ecchymosis, open wounds, or warmth. Assessment for effusion difficult secondary to body habitus.  UEs: Normal AROM throughout without palpable bony tenderness.  Back: no midline tenderness.  LEs: Intact AROM to bilateral ankles. Bilateral hip/knee ROM limited secondary to  knee pain. He has trouble lifting his legs off of the bed to evaluate hip flexion secondary to pain in the knees. In the seated position on the stretcher his LEs are held in extension, he is able to flex the knees to approximately 60 degrees bilaterally, then able to place back in extension while on the stretcher, unable to move to sit on edge of bedside at this time for further assessment secondary to pain. Diffusely tender to bilateral knees including patella & medial/lateral joint line.  He is also tender to the bilateral distal quadriceps.  No other tenderness noted throughout the LEs, specifically no pelvic/hip tenderness. Difficult assessment of strength @ knees secondary to pain. NVI distally. Special tests not performed secondary to difficulty w/ body habitus.   Skin:    General: Skin is warm and dry.     Findings: No rash.  Neurological:     Mental Status: He is alert.     Comments: Clear speech. Sensation grossly intact to bilateral lower extremities. 5/5 strength with plantar/dorsiflexion bilaterally. Trial of ambulation initially deferred secondary to pain.   Psychiatric:        Behavior: Behavior normal.      ED Treatments / Results  Labs (all labs ordered are listed, but only abnormal results are displayed) Labs Reviewed - No data to display  EKG None  Radiology Ct Knee Left Wo Contrast  Result Date: 07/20/2018 CLINICAL DATA:  Patient tripped at work and could not get up. Patient heard a pop and has pain and burning in the knees. EXAM: CT OF THE LEFT KNEE WITHOUT CONTRAST TECHNIQUE: Multidetector CT imaging of the LEFT knee was performed  according to the standard protocol. Multiplanar CT image reconstructions were also generated. COMPARISON:  None. FINDINGS: Bones/Joint/Cartilage Small to moderate suprapatellar joint effusion. No acute fracture joint dislocation. No suspicious osseous lesions are identified. Mild enthesopathy off the upper pole the patella along the quadriceps. Chondrocalcinosis of hyaline cartilage. Ligaments Suboptimally assessed by CT. Muscles and Tendons The distal quadriceps muscles and tendon are thickened and somewhat hyperdense in appearance raising suspicion for high-grade intramuscular strain/tear with likely intramuscular hemorrhage accounting for the hyperdense appearance, series 6/59. The patellar tendon appears intact. Soft tissues No soft tissue hematoma or fluid collection. IMPRESSION: 1. Thickened and somewhat hyperdense appearance of the distal quadriceps muscles and tendon raising suspicion a high-grade intramuscular strain/tear with likely intramuscular hemorrhage accounting for the hyperdense appearance. 2. Small to moderate suprapatellar joint effusion. 3. No acute fracture nor joint dislocation. Electronically Signed   By: Tollie Ethavid  Kwon M.D.   On: 07/20/2018 20:59   Ct Knee Right Wo Contrast  Result Date: 07/20/2018 CLINICAL DATA:  Larey SeatFell.  Severe pain. EXAM: CT OF THE right KNEE WITHOUT CONTRAST TECHNIQUE: Multidetector CT imaging of the right knee was performed according to the standard protocol. Multiplanar CT image reconstructions were also generated. COMPARISON:  None. FINDINGS: No acute fracture is identified. There is a calcific density in the posterior joint space posterior to the PCL. The PCL also demonstrates some streaky calcifications. Chondrocalcinosis is noted involving both the medial and lateral menisci. Findings could be due to CPPD arthropathy. The patella is lying low (patella [place]). The patellar tendon is wavy and redundant. I suspect the quadriceps tendon is completely ruptured and there  is a large hematoma and joint effusion in this area. Recommend orthopedic consultation and MRI would be the best test for further evaluation. IMPRESSION: 1. Suspect completely ruptured quadriceps tendon. Associated large hematoma and suprapatellar knee  joint effusion. Recommend orthopedic consultation. MRI would be helpful for further evaluation. 2. Chondrocalcinosis suggesting CPPD arthropathy. 3. No acute fractures are demonstrated. Electronically Signed   By: Rudie Meyer M.D.   On: 07/20/2018 20:59   Dg Knee Complete 4 Views Left  Result Date: 07/20/2018 CLINICAL DATA:  Pt c/o bilateral knee pain after slipping and falling on a wet floor today. Hx of previous arthroscopy knee surgery; pt does not know which laterality was operated on. Best obtainable images due to pt condition. EXAM: LEFT KNEE - COMPLETE 4+ VIEW COMPARISON:  None. FINDINGS: No evidence of fracture, dislocation, or joint effusion. No evidence of arthropathy or other focal bone abnormality. Soft tissues are unremarkable. IMPRESSION: Negative. Electronically Signed   By: Norva Pavlov M.D.   On: 07/20/2018 15:45   Dg Knee Complete 4 Views Right  Result Date: 07/20/2018 CLINICAL DATA:  Pt c/o bilateral knee pain after slipping and falling on a wet floor today. Hx of previous arthroscopy knee surgery; pt does not know which laterality was operated on. Best obtainable images due to pt condition. EXAM: RIGHT KNEE - COMPLETE 4+ VIEW COMPARISON:  None. FINDINGS: No acute fracture or subluxation. Degenerative changes are identified at the patellofemoral compartment. Calcific density along the posterior intercondylar notch raises a question of remote ligamentous injury. IMPRESSION: No evidence for acute abnormality. Electronically Signed   By: Norva Pavlov M.D.   On: 07/20/2018 15:49    Procedures Procedures (including critical care time)  Medications Ordered in ED Medications  ketorolac (TORADOL) injection 60 mg (60 mg  Intramuscular Refused 07/20/18 2012)  oxyCODONE-acetaminophen (PERCOCET/ROXICET) 5-325 MG per tablet 1 tablet (1 tablet Oral Given 07/20/18 1601)  morphine 4 MG/ML injection 4 mg (4 mg Intramuscular Given 07/20/18 1759)  ondansetron (ZOFRAN-ODT) disintegrating tablet 4 mg (4 mg Oral Given 07/20/18 1800)     Initial Impression / Assessment and Plan / ED Course  I have reviewed the triage vital signs and the nursing notes.  Pertinent labs & imaging results that were available during my care of the patient were reviewed by me and considered in my medical decision making (see chart for details).   Patient presents to the emergency department status post mechanical fall with bilateral knee pain.  Nontoxic-appearing, no apparent distress, vitals notable for elevated blood pressure, doubt HTN emergency.  Fall appears to have been mechanical in nature, no prodromal symptoms, slipped on wet floor.  No evidence of serious head, neck, or back injury.  No midline spinal tenderness or focal neurologic deficits.  Do not feel that CT of the head/neck is necessary secondary to [place] head CT/cervical spine rules.  No chest or abdominal tenderness to suggest acute traumatic intra-thoracic/intra-abdominal  injury.  Appears to have isolated injuries to the bilateral knees.  Diffusely tender to distal quads as well as anteriorly and to the medial and lateral joint line bilaterally. ROM limited but able to somewhat flex/extend bilaterally. Neurovascularly intact distally.   Initial evaluation with x-rays of bilateral knees which were negative for acute abnormality.  Patient states he feels he cannot ambulate secondary to discomfort primarily in the left knee.  He has received Percocet as well as morphine in the department.  We will trial Toradol.  We will also obtain CT without contrast to bilateral knees to further assess for injury given persistent pain and inability to ambulate. Also remains unable to move to bedside  secondary to pain.   CT scans notable for: R knee:  Suspected completely ruptured quadriceps  tendon. Associated large hematoma and suprapatellar knee joint effusion. Recommend orthopedic consultation. MRI would be helpful for further evaluation.  No acute fractures are demonstrated.  L knee:  Thickened and somewhat hyperdense appearance of the distal quadriceps muscles and tendon raising suspicion a high-grade intramuscular strain/tear with likely intramuscular hemorrhage accounting for the hyperdense appearance. Small to moderate suprapatellar joint effusion. No acute fracture nor joint dislocation.   Care transition to supervising physician Dr. Adela LankFloyd at change of shift pending orthopedics consultation and disposition.   Final Clinical Impressions(s) / ED Diagnoses   Final diagnoses:  None    ED Discharge Orders    None       Cherly Andersonetrucelli, Dietrich Samuelson R, PA-C 07/20/18 2056  Addendum as CT imaging resulted prior to end of shift.    Desmond Lopeetrucelli, Lorrene Graef R, PA-C 07/20/18 2118    Melene PlanFloyd, Dan, DO 07/20/18 2222

## 2018-07-20 NOTE — ED Notes (Signed)
Pt refused IV and then changed his mind.

## 2018-07-20 NOTE — ED Notes (Signed)
Patient transported to CT 

## 2018-07-20 NOTE — ED Notes (Signed)
Two unsuccessful IV sticks 

## 2018-07-20 NOTE — ED Triage Notes (Signed)
PT tripped over at work and could not get up. Heard pop and pain and burning. Left knee pain. Morbidly obese. No LOC. Asthma and HTN

## 2018-07-21 ENCOUNTER — Inpatient Hospital Stay (HOSPITAL_COMMUNITY): Payer: No Typology Code available for payment source | Admitting: Anesthesiology

## 2018-07-21 ENCOUNTER — Encounter (HOSPITAL_COMMUNITY): Payer: Self-pay | Admitting: Internal Medicine

## 2018-07-21 ENCOUNTER — Encounter (HOSPITAL_COMMUNITY)
Admission: EM | Disposition: A | Discharge status: Rehabilitation Facility | Payer: Self-pay | Source: Home / Self Care | Attending: Internal Medicine

## 2018-07-21 ENCOUNTER — Other Ambulatory Visit: Payer: Self-pay

## 2018-07-21 DIAGNOSIS — S76111A Strain of right quadriceps muscle, fascia and tendon, initial encounter: Secondary | ICD-10-CM

## 2018-07-21 DIAGNOSIS — S76112A Strain of left quadriceps muscle, fascia and tendon, initial encounter: Secondary | ICD-10-CM

## 2018-07-21 HISTORY — PX: REPAIR QUADRICEPS/HAMSTRING MUSCLES: SHX6563

## 2018-07-21 LAB — CREATININE, SERUM
Creatinine, Ser: 0.78 mg/dL (ref 0.61–1.24)
GFR calc Af Amer: >60 mL/min (ref >60)
GFR calc non Af Amer: >60 mL/min (ref >60)

## 2018-07-21 LAB — ABO/RH: ABO/RH(D): O NEG

## 2018-07-21 LAB — CBC
HEMATOCRIT: 29.6 % — AB (ref 39.0–52.0)
Hemoglobin: 9.8 g/dL — ABNORMAL LOW (ref 13.0–17.0)
MCH: 31.6 pg (ref 26.0–34.0)
MCHC: 33.1 g/dL (ref 30.0–36.0)
MCV: 95.5 fL (ref 80.0–100.0)
Platelets: 141 10*3/uL — ABNORMAL LOW (ref 150–400)
RBC: 3.1 MIL/uL — ABNORMAL LOW (ref 4.22–5.81)
RDW: 14 % (ref 11.5–15.5)
WBC: 13.3 10*3/uL — AB (ref 4.0–10.5)
nRBC: 0 % (ref 0.0–0.2)

## 2018-07-21 LAB — POCT I-STAT 4, (NA,K, GLUC, HGB,HCT)
Glucose, Bld: 186 mg/dL — ABNORMAL HIGH (ref 70–99)
HCT: 29 % — ABNORMAL LOW (ref 39.0–52.0)
Hemoglobin: 9.9 g/dL — ABNORMAL LOW (ref 13.0–17.0)
Potassium: 4.8 mmol/L (ref 3.5–5.1)
Sodium: 135 mmol/L (ref 135–145)

## 2018-07-21 LAB — HIV ANTIBODY (ROUTINE TESTING W REFLEX): HIV Screen 4th Generation wRfx: NONREACTIVE

## 2018-07-21 SURGERY — REPAIR, MUSCLE, QUADRICEPS OR HAMSTRING
Anesthesia: General | Site: Knee | Laterality: Bilateral

## 2018-07-21 MED ORDER — OXYCODONE HCL ER 10 MG PO T12A
10.0000 mg | EXTENDED_RELEASE_TABLET | Freq: Two times a day (BID) | ORAL | Status: DC
  Administered 2018-07-21 – 2018-08-01 (×22): 10 mg via ORAL
  Filled 2018-07-21 (×22): qty 1

## 2018-07-21 MED ORDER — LACTATED RINGERS IV SOLN
INTRAVENOUS | Status: DC
  Administered 2018-07-21: 12:00:00 via INTRAVENOUS

## 2018-07-21 MED ORDER — METHOCARBAMOL 750 MG PO TABS
750.0000 mg | ORAL_TABLET | Freq: Three times a day (TID) | ORAL | Status: DC | PRN
  Administered 2018-07-21 – 2018-08-01 (×21): 750 mg via ORAL
  Filled 2018-07-21 (×21): qty 1

## 2018-07-21 MED ORDER — MIDAZOLAM HCL 5 MG/5ML IJ SOLN
INTRAMUSCULAR | Status: DC | PRN
  Administered 2018-07-21: 2 mg via INTRAVENOUS

## 2018-07-21 MED ORDER — SODIUM CHLORIDE 0.9 % IV SOLN
INTRAVENOUS | Status: DC
  Administered 2018-07-21: 18:00:00 via INTRAVENOUS

## 2018-07-21 MED ORDER — ONDANSETRON HCL 4 MG/2ML IJ SOLN
INTRAMUSCULAR | Status: DC | PRN
  Administered 2018-07-21: 4 mg via INTRAVENOUS

## 2018-07-21 MED ORDER — 0.9 % SODIUM CHLORIDE (POUR BTL) OPTIME
TOPICAL | Status: DC | PRN
  Administered 2018-07-21: 1000 mL

## 2018-07-21 MED ORDER — ONDANSETRON HCL 4 MG/2ML IJ SOLN: 4.0000 mg | Freq: Four times a day (QID) | INTRAMUSCULAR | Status: DC | PRN

## 2018-07-21 MED ORDER — EPHEDRINE SULFATE 50 MG/ML IJ SOLN
INTRAMUSCULAR | Status: DC | PRN
  Administered 2018-07-21 (×2): 10 mg via INTRAVENOUS

## 2018-07-21 MED ORDER — PHENYLEPHRINE HCL 10 MG/ML IJ SOLN
INTRAMUSCULAR | Status: AC
  Filled 2018-07-21: qty 1

## 2018-07-21 MED ORDER — MIDAZOLAM HCL 2 MG/2ML IJ SOLN
INTRAMUSCULAR | Status: AC
  Filled 2018-07-21: qty 2

## 2018-07-21 MED ORDER — ACETAMINOPHEN 325 MG PO TABS
325.0000 mg | ORAL_TABLET | Freq: Four times a day (QID) | ORAL | Status: DC | PRN
  Administered 2018-07-23 – 2018-08-01 (×4): 650 mg via ORAL
  Filled 2018-07-21 (×6): qty 2

## 2018-07-21 MED ORDER — METOCLOPRAMIDE HCL 5 MG PO TABS: 5.0000 mg | ORAL_TABLET | Freq: Three times a day (TID) | ORAL | Status: DC | PRN

## 2018-07-21 MED ORDER — LIDOCAINE 2% (20 MG/ML) 5 ML SYRINGE
INTRAMUSCULAR | Status: AC
  Filled 2018-07-21: qty 15

## 2018-07-21 MED ORDER — ROCURONIUM BROMIDE 50 MG/5ML IV SOSY
PREFILLED_SYRINGE | INTRAVENOUS | Status: AC
  Filled 2018-07-21: qty 20

## 2018-07-21 MED ORDER — LACTATED RINGERS IV SOLN
INTRAVENOUS | Status: DC | PRN
  Administered 2018-07-21 (×4): via INTRAVENOUS

## 2018-07-21 MED ORDER — ONDANSETRON HCL 4 MG/2ML IJ SOLN
INTRAMUSCULAR | Status: AC
  Filled 2018-07-21: qty 6

## 2018-07-21 MED ORDER — SODIUM CHLORIDE 0.9 % IR SOLN
Status: DC | PRN
  Administered 2018-07-21: 3000 mL

## 2018-07-21 MED ORDER — FENTANYL CITRATE (PF) 100 MCG/2ML IJ SOLN: 25.0000 ug | INTRAMUSCULAR | Status: DC | PRN

## 2018-07-21 MED ORDER — POLYETHYLENE GLYCOL 3350 17 G PO PACK
17.0000 g | PACK | Freq: Every day | ORAL | Status: DC | PRN
  Administered 2018-07-30: 17 g via ORAL
  Filled 2018-07-21 (×3): qty 1

## 2018-07-21 MED ORDER — ACETAMINOPHEN 500 MG PO TABS
1000.0000 mg | ORAL_TABLET | Freq: Four times a day (QID) | ORAL | Status: AC
  Administered 2018-07-21 – 2018-07-22 (×3): 1000 mg via ORAL
  Filled 2018-07-21 (×3): qty 2

## 2018-07-21 MED ORDER — FENTANYL CITRATE (PF) 100 MCG/2ML IJ SOLN
INTRAMUSCULAR | Status: DC | PRN
  Administered 2018-07-21 (×4): 50 ug via INTRAVENOUS
  Administered 2018-07-21: 100 ug via INTRAVENOUS
  Administered 2018-07-21: 50 ug via INTRAVENOUS

## 2018-07-21 MED ORDER — DOCUSATE SODIUM 100 MG PO CAPS
100.0000 mg | ORAL_CAPSULE | Freq: Two times a day (BID) | ORAL | Status: DC
  Administered 2018-07-21 – 2018-07-22 (×2): 100 mg via ORAL
  Filled 2018-07-21 (×2): qty 1

## 2018-07-21 MED ORDER — SODIUM CHLORIDE 0.9 % IV SOLN
INTRAVENOUS | Status: DC
  Administered 2018-07-21: 09:00:00 via INTRAVENOUS

## 2018-07-21 MED ORDER — PHENYLEPHRINE HCL 10 MG/ML IJ SOLN
INTRAMUSCULAR | Status: DC | PRN
  Administered 2018-07-21: 80 ug via INTRAVENOUS
  Administered 2018-07-21: 120 ug via INTRAVENOUS

## 2018-07-21 MED ORDER — PROMETHAZINE HCL 25 MG/ML IJ SOLN: 6.2500 mg | INTRAMUSCULAR | Status: DC | PRN

## 2018-07-21 MED ORDER — MORPHINE SULFATE (PF) 2 MG/ML IV SOLN
0.5000 mg | INTRAVENOUS | Status: DC | PRN
  Administered 2018-07-21 (×2): 0.5 mg via INTRAVENOUS
  Filled 2018-07-21 (×2): qty 1

## 2018-07-21 MED ORDER — GLYCOPYRROLATE PF 0.2 MG/ML IJ SOSY
PREFILLED_SYRINGE | INTRAMUSCULAR | Status: AC
  Filled 2018-07-21: qty 1

## 2018-07-21 MED ORDER — DEXAMETHASONE SODIUM PHOSPHATE 10 MG/ML IJ SOLN
INTRAMUSCULAR | Status: DC | PRN
  Administered 2018-07-21: 10 mg via INTRAVENOUS

## 2018-07-21 MED ORDER — ALBUTEROL SULFATE (2.5 MG/3ML) 0.083% IN NEBU
2.5000 mg | INHALATION_SOLUTION | RESPIRATORY_TRACT | Status: DC | PRN
  Administered 2018-07-22: 2.5 mg via RESPIRATORY_TRACT
  Filled 2018-07-21 (×2): qty 3

## 2018-07-21 MED ORDER — LIDOCAINE 2% (20 MG/ML) 5 ML SYRINGE
INTRAMUSCULAR | Status: DC | PRN
  Administered 2018-07-21: 80 mg via INTRAVENOUS

## 2018-07-21 MED ORDER — ENOXAPARIN SODIUM 40 MG/0.4ML ~~LOC~~ SOLN
40.0000 mg | SUBCUTANEOUS | Status: DC
  Administered 2018-07-22: 40 mg via SUBCUTANEOUS
  Filled 2018-07-21: qty 0.4

## 2018-07-21 MED ORDER — HYDROMORPHONE HCL 1 MG/ML IJ SOLN
0.5000 mg | INTRAMUSCULAR | Status: DC | PRN
  Administered 2018-07-21 – 2018-07-26 (×11): 1 mg via INTRAVENOUS
  Filled 2018-07-21 (×13): qty 1

## 2018-07-21 MED ORDER — GLYCOPYRROLATE PF 0.2 MG/ML IJ SOSY
PREFILLED_SYRINGE | INTRAMUSCULAR | Status: DC | PRN
  Administered 2018-07-21: .2 mg via INTRAVENOUS

## 2018-07-21 MED ORDER — METHOCARBAMOL 1000 MG/10ML IJ SOLN: 500.0000 mg | Freq: Four times a day (QID) | INTRAVENOUS | Status: DC | PRN

## 2018-07-21 MED ORDER — METHOCARBAMOL 500 MG PO TABS: 500.0000 mg | ORAL_TABLET | Freq: Four times a day (QID) | ORAL | Status: DC | PRN

## 2018-07-21 MED ORDER — OXYCODONE HCL 5 MG PO TABS
5.0000 mg | ORAL_TABLET | ORAL | Status: DC | PRN
  Administered 2018-07-22 – 2018-07-23 (×2): 5 mg via ORAL
  Administered 2018-07-23 – 2018-07-25 (×3): 10 mg via ORAL
  Filled 2018-07-21 (×2): qty 2
  Filled 2018-07-21: qty 1
  Filled 2018-07-21 (×6): qty 2

## 2018-07-21 MED ORDER — SUCCINYLCHOLINE CHLORIDE 20 MG/ML IJ SOLN
INTRAMUSCULAR | Status: DC | PRN
  Administered 2018-07-21: 120 mg via INTRAVENOUS

## 2018-07-21 MED ORDER — ONDANSETRON HCL 4 MG PO TABS: 4.0000 mg | ORAL_TABLET | Freq: Four times a day (QID) | ORAL | Status: DC | PRN

## 2018-07-21 MED ORDER — SODIUM CHLORIDE 0.9 % IV SOLN
INTRAVENOUS | Status: DC | PRN
  Administered 2018-07-21: 50 ug/min via INTRAVENOUS

## 2018-07-21 MED ORDER — VANCOMYCIN HCL 1000 MG IV SOLR
INTRAVENOUS | Status: AC
  Filled 2018-07-21: qty 1000

## 2018-07-21 MED ORDER — ACETAMINOPHEN 500 MG PO TABS
1000.0000 mg | ORAL_TABLET | Freq: Once | ORAL | Status: AC
  Administered 2018-07-21: 1000 mg via ORAL
  Filled 2018-07-21: qty 2

## 2018-07-21 MED ORDER — DEXTROSE 5 % IV SOLN
3.0000 g | Freq: Once | INTRAVENOUS | Status: AC
  Administered 2018-07-21: 3 g via INTRAVENOUS
  Filled 2018-07-21: qty 3

## 2018-07-21 MED ORDER — FENTANYL CITRATE (PF) 250 MCG/5ML IJ SOLN
INTRAMUSCULAR | Status: AC
  Filled 2018-07-21: qty 5

## 2018-07-21 MED ORDER — OXYCODONE HCL 5 MG PO TABS
10.0000 mg | ORAL_TABLET | ORAL | Status: DC | PRN
  Administered 2018-07-21 – 2018-07-22 (×2): 15 mg via ORAL
  Administered 2018-07-22 – 2018-07-24 (×3): 10 mg via ORAL
  Administered 2018-07-24 – 2018-07-26 (×2): 15 mg via ORAL
  Filled 2018-07-21: qty 3
  Filled 2018-07-21: qty 2
  Filled 2018-07-21 (×2): qty 3
  Filled 2018-07-21: qty 2
  Filled 2018-07-21: qty 3

## 2018-07-21 MED ORDER — HYDRALAZINE HCL 20 MG/ML IJ SOLN: 10.0000 mg | INTRAMUSCULAR | Status: DC | PRN

## 2018-07-21 MED ORDER — MOMETASONE FURO-FORMOTEROL FUM 100-5 MCG/ACT IN AERO
2.0000 | INHALATION_SPRAY | Freq: Two times a day (BID) | RESPIRATORY_TRACT | Status: DC
  Filled 2018-07-21: qty 8.8

## 2018-07-21 MED ORDER — DIPHENHYDRAMINE HCL 12.5 MG/5ML PO ELIX
25.0000 mg | ORAL_SOLUTION | ORAL | Status: DC | PRN
  Administered 2018-07-23 – 2018-07-31 (×4): 25 mg via ORAL
  Filled 2018-07-21 (×5): qty 10

## 2018-07-21 MED ORDER — PROPOFOL 10 MG/ML IV BOLUS
INTRAVENOUS | Status: DC | PRN
  Administered 2018-07-21: 300 mg via INTRAVENOUS

## 2018-07-21 MED ORDER — VANCOMYCIN HCL 1000 MG IV SOLR
INTRAVENOUS | Status: DC | PRN
  Administered 2018-07-21 (×2): 1000 mg via TOPICAL

## 2018-07-21 MED ORDER — DEXTROSE 5 % IV SOLN
3.0000 g | Freq: Three times a day (TID) | INTRAVENOUS | Status: AC
  Administered 2018-07-21 – 2018-07-22 (×3): 3 g via INTRAVENOUS
  Filled 2018-07-21 (×3): qty 3000
  Filled 2018-07-21: qty 3

## 2018-07-21 MED ORDER — PROPOFOL 10 MG/ML IV BOLUS
INTRAVENOUS | Status: AC
  Filled 2018-07-21: qty 40

## 2018-07-21 MED ORDER — SORBITOL 70 % SOLN
30.0000 mL | Freq: Every day | Status: DC | PRN
  Filled 2018-07-21: qty 30

## 2018-07-21 MED ORDER — MAGNESIUM CITRATE PO SOLN
1.0000 | Freq: Once | ORAL | Status: AC | PRN
  Administered 2018-07-27: 1 via ORAL
  Filled 2018-07-21: qty 296

## 2018-07-21 MED ORDER — METOCLOPRAMIDE HCL 5 MG/ML IJ SOLN: 5.0000 mg | Freq: Three times a day (TID) | INTRAMUSCULAR | Status: DC | PRN

## 2018-07-21 MED ORDER — DEXAMETHASONE SODIUM PHOSPHATE 10 MG/ML IJ SOLN
INTRAMUSCULAR | Status: AC
  Filled 2018-07-21: qty 3

## 2018-07-21 SURGICAL SUPPLY — 62 items
BANDAGE ACE 3X5.8 VEL STRL LF (GAUZE/BANDAGES/DRESSINGS) IMPLANT
BANDAGE ESMARK 6X9 LF (GAUZE/BANDAGES/DRESSINGS) ×1 IMPLANT
BLADE SURG 10 STRL SS (BLADE) ×6 IMPLANT
BNDG COHESIVE 6X5 TAN STRL LF (GAUZE/BANDAGES/DRESSINGS) ×12 IMPLANT
BNDG ELASTIC 6X15 VLCR STRL LF (GAUZE/BANDAGES/DRESSINGS) ×6 IMPLANT
BNDG ESMARK 6X9 LF (GAUZE/BANDAGES/DRESSINGS) ×3
BNDG GAUZE ELAST 4 BULKY (GAUZE/BANDAGES/DRESSINGS) ×18 IMPLANT
CORDS BIPOLAR (ELECTRODE) IMPLANT
COVER SURGICAL LIGHT HANDLE (MISCELLANEOUS) ×3 IMPLANT
COVER WAND RF STERILE (DRAPES) ×3 IMPLANT
CUFF TOURNIQUET SINGLE 44IN (TOURNIQUET CUFF) ×6 IMPLANT
DRAPE IMP U-DRAPE 54X76 (DRAPES) IMPLANT
DRAPE INCISE IOBAN 66X45 STRL (DRAPES) ×3 IMPLANT
DRAPE U-SHAPE 47X51 STRL (DRAPES) ×3 IMPLANT
DURAPREP 26ML APPLICATOR (WOUND CARE) ×3 IMPLANT
ELECT CAUTERY BLADE 6.4 (BLADE) ×3 IMPLANT
ELECT REM PT RETURN 9FT ADLT (ELECTROSURGICAL) ×3
ELECTRODE REM PT RTRN 9FT ADLT (ELECTROSURGICAL) ×1 IMPLANT
GAUZE SPONGE 4X4 12PLY STRL (GAUZE/BANDAGES/DRESSINGS) ×18 IMPLANT
GAUZE XEROFORM 5X9 LF (GAUZE/BANDAGES/DRESSINGS) ×6 IMPLANT
GLOVE BIOGEL PI IND STRL 7.0 (GLOVE) ×1 IMPLANT
GLOVE BIOGEL PI INDICATOR 7.0 (GLOVE) ×2
GLOVE ECLIPSE 7.0 STRL STRAW (GLOVE) ×3 IMPLANT
GLOVE INDICATOR 7.0 STRL GRN (GLOVE) ×3 IMPLANT
GLOVE SKINSENSE NS SZ7.5 (GLOVE) ×6
GLOVE SKINSENSE STRL SZ7.5 (GLOVE) ×3 IMPLANT
GLOVE SURG SS PI 7.0 STRL IVOR (GLOVE) ×3 IMPLANT
GOWN STRL REIN XL XLG (GOWN DISPOSABLE) ×3 IMPLANT
HANDPIECE INTERPULSE COAX TIP (DISPOSABLE) ×2
HOVERMATT SINGLE USE (MISCELLANEOUS) ×3 IMPLANT
IMMOBILIZER KNEE 22 UNIV (SOFTGOODS) ×6 IMPLANT
KIT BASIN OR (CUSTOM PROCEDURE TRAY) ×3 IMPLANT
KIT TURNOVER KIT B (KITS) ×3 IMPLANT
MANIFOLD NEPTUNE II (INSTRUMENTS) ×3 IMPLANT
PACK ORTHO EXTREMITY (CUSTOM PROCEDURE TRAY) ×3 IMPLANT
PAD ABD 8X10 STRL (GAUZE/BANDAGES/DRESSINGS) ×12 IMPLANT
PAD ARMBOARD 7.5X6 YLW CONV (MISCELLANEOUS) ×9 IMPLANT
PADDING CAST ABS 4INX4YD NS (CAST SUPPLIES)
PADDING CAST ABS COTTON 4X4 ST (CAST SUPPLIES) IMPLANT
PADDING CAST COTTON 6X4 STRL (CAST SUPPLIES) IMPLANT
PASSER SUT SWANSON 36MM LOOP (INSTRUMENTS) ×3 IMPLANT
RETRIEVER SUT HEWSON (MISCELLANEOUS) ×6 IMPLANT
SET HNDPC FAN SPRY TIP SCT (DISPOSABLE) ×1 IMPLANT
SPONGE LAP 18X18 RF (DISPOSABLE) ×6 IMPLANT
SPONGE LAP 18X18 X RAY DECT (DISPOSABLE) ×18 IMPLANT
STOCKINETTE IMPERVIOUS LG (DRAPES) ×6 IMPLANT
SUCTION FRAZIER TIP 10 FR DISP (SUCTIONS) ×3 IMPLANT
SUT ETHIBOND 5 LR DA (SUTURE) ×6 IMPLANT
SUT ETHILON 2 0 FS 18 (SUTURE) ×24 IMPLANT
SUT FIBERWIRE #5 38 CONV NDL (SUTURE) ×12
SUT VIC AB 0 CT1 27 (SUTURE) ×10
SUT VIC AB 0 CT1 27XBRD ANBCTR (SUTURE) ×5 IMPLANT
SUT VIC AB 0 CTX 36 (SUTURE) ×2
SUT VIC AB 0 CTX36XBRD ANTBCTR (SUTURE) ×1 IMPLANT
SUT VIC AB 2-0 CT1 27 (SUTURE) ×18
SUT VIC AB 2-0 CT1 TAPERPNT 27 (SUTURE) ×9 IMPLANT
SUTURE FIBERWR #5 38 CONV NDL (SUTURE) ×4 IMPLANT
TOWEL OR 17X26 10 PK STRL BLUE (TOWEL DISPOSABLE) ×3 IMPLANT
TUBE CONNECTING 12'X1/4 (SUCTIONS) ×1
TUBE CONNECTING 12X1/4 (SUCTIONS) ×2 IMPLANT
UNDERPAD 30X30 (UNDERPADS AND DIAPERS) ×6 IMPLANT
YANKAUER SUCT BULB TIP NO VENT (SUCTIONS) ×3 IMPLANT

## 2018-07-21 NOTE — Anesthesia Preprocedure Evaluation (Addendum)
Anesthesia Evaluation  Patient identified by MRN, date of birth, ID band Patient awake    Reviewed: Allergy & Precautions, Patient's Chart, lab work & pertinent test results  History of Anesthesia Complications Negative for: history of anesthetic complications  Airway Mallampati: III  TM Distance: >3 FB Neck ROM: Full    Dental no notable dental hx. (+) Dental Advisory Given   Pulmonary asthma , sleep apnea , Current Smoker,    Pulmonary exam normal        Cardiovascular hypertension, Pt. on medications Normal cardiovascular exam     Neuro/Psych PSYCHIATRIC DISORDERS Anxiety Depression negative neurological ROS     GI/Hepatic negative GI ROS, Neg liver ROS,   Endo/Other  Morbid obesity  Renal/GU negative Renal ROS     Musculoskeletal   Abdominal   Peds  Hematology negative hematology ROS (+)   Anesthesia Other Findings Day of surgery medications reviewed with the patient.  Reproductive/Obstetrics                           Anesthesia Physical Anesthesia Plan  ASA: III  Anesthesia Plan: General   Post-op Pain Management:    Induction: Intravenous  PONV Risk Score and Plan: 2 and Ondansetron and Dexamethasone  Airway Management Planned: Oral ETT and LMA  Additional Equipment:   Intra-op Plan:   Post-operative Plan: Extubation in OR  Informed Consent: I have reviewed the patients History and Physical, chart, labs and discussed the procedure including the risks, benefits and alternatives for the proposed anesthesia with the patient or authorized representative who has indicated his/her understanding and acceptance.     Dental advisory given  Plan Discussed with: CRNA, Anesthesiologist and Surgeon  Anesthesia Plan Comments:        Anesthesia Quick Evaluation

## 2018-07-21 NOTE — Progress Notes (Signed)
Patient is refusing any extra devices placed on him at this time.  No CPAP, no SCD's, and no CPOX for now.  Patient would like to get some sleep.

## 2018-07-21 NOTE — Progress Notes (Signed)
Nutrition Brief Note  RD consult received from the hip fracture protocol. Patient out of room in the OR. Spoke with his wife and family. Patient with no nutrition issues PTA, except morbid obesity. He is seeing an outpatient RD for weight loss. Wife suspects patient will eat well after returning from surgery.   Body mass index is 64.2 kg/m. Patient meets criteria for class 3, extreme/morbid obesity based on current BMI.   Current diet order is NPO for surgery. Labs and medications reviewed.   No nutrition interventions warranted at this time. If nutrition issues arise, please consult RD.   Joaquin Courts, RD, LDN, CNSC Pager [phone] After Hours Pager [phone]

## 2018-07-21 NOTE — Transfer of Care (Signed)
Immediate Anesthesia Transfer of Care Note  Patient: Larry Fowler  Procedure(s) Performed: REPAIR BILATERAL QUADRICEPS (Bilateral Knee)  Patient Location: PACU  Anesthesia Type:General  Level of Consciousness: drowsy  Airway & Oxygen Therapy: Patient Spontanous Breathing and Patient connected to face mask oxygen  Post-op Assessment: Report given to RN, Post -op Vital signs reviewed and stable and Patient moving all extremities  Post vital signs: Reviewed and stable  Last Vitals:  Vitals Value Taken Time  BP 108/68 07/21/2018  3:42 PM  Temp    Pulse 84 07/21/2018  3:45 PM  Resp 16 07/21/2018  3:45 PM  SpO2 100 % 07/21/2018  3:45 PM  Vitals shown include unvalidated device data.  Last Pain:  Vitals:   07/21/18 0900  TempSrc:   PainSc: 2       Patients Stated Pain Goal: 3 (07/21/18 0030)  Complications: No apparent anesthesia complications

## 2018-07-21 NOTE — Anesthesia Postprocedure Evaluation (Signed)
Anesthesia Post Note  Patient: Larry Fowler  Procedure(s) Performed: REPAIR BILATERAL QUADRICEPS (Bilateral Knee)     Patient location during evaluation: PACU Anesthesia Type: General Level of consciousness: awake and alert and oriented Pain management: pain level controlled Vital Signs Assessment: post-procedure vital signs reviewed and stable Respiratory status: spontaneous breathing, nonlabored ventilation, respiratory function stable and patient connected to nasal cannula oxygen Cardiovascular status: blood pressure returned to baseline and stable Postop Assessment: no apparent nausea or vomiting Anesthetic complications: no    Last Vitals:  Vitals:   07/21/18 1545 07/21/18 1600  BP: 108/68 119/72  Pulse: 73 90  Resp: 14 13  Temp: (!) 36.3 C   SpO2: 100% 98%    Last Pain:  Vitals:   07/21/18 1545  TempSrc:   PainSc: Asleep                 Avinash Maltos A.

## 2018-07-21 NOTE — Anesthesia Procedure Notes (Signed)
Procedure Name: Intubation Date/Time: 07/21/2018 12:29 PM Performed by: Neldon Newport, CRNA Pre-anesthesia Checklist: Timeout performed, Patient being monitored, Suction available, Emergency Drugs available and Patient identified Patient Re-evaluated:Patient Re-evaluated prior to induction Oxygen Delivery Method: Circle system utilized Preoxygenation: Pre-oxygenation with 100% oxygen Induction Type: IV induction and Rapid sequence Ventilation: Mask ventilation without difficulty and Oral airway inserted - appropriate to patient size Laryngoscope Size: Mac and 4 Grade View: Grade I Tube type: Oral Tube size: 7.5 mm Number of attempts: 1 Placement Confirmation: breath sounds checked- equal and bilateral,  positive ETCO2 and ETT inserted through vocal cords under direct vision Secured at: 24 cm Tube secured with: Tape Dental Injury: Teeth and Oropharynx as per pre-operative assessment

## 2018-07-21 NOTE — Progress Notes (Signed)
PROGRESS NOTE                                                                                                                                                                                                             Patient Demographics:    Larry Fowler, is a 48 y.o. male, DOB - Jan 06, 1971, OZY:[id number]  Admit date - 07/20/2018   Admitting Physician Eduard Clos, MD  Outpatient Primary MD for the patient is [name], MD  LOS - 1   Chief Complaint  Patient presents with  . Joint Swelling       Brief Narrative    This is a no charge note as patient admitted earlier today  48 y.o. male with history of morbid obesity, asthma, hypertension, sleep apnea had a fall at home while walking to the bathroom.  Patient slipped and fell backwards.  Following which patient's knee started hurting.  Denies any loss of consciousness chest pain.had CAT scan done which shows bilateral quadricep tendon tear.  On-call orthopedic surgeon Dr.Xu was consulted and since patient has medical issues admitted to hospitalist service.   Subjective:    Larry Fowler today has any chest pain or shortness of breath, bilateral lower extremity pain.   Assessment  & Plan :    Principal Problem:   Quadriceps tendon rupture Active Problems:   Asthma   Morbid obesity (HCC)   OSA (obstructive sleep apnea)   HTN (hypertension)   Hyperglycemia   1. Bilateral quadricep tendon tear -orthopedic surgeon is planning to do surgery.  In anticipation of which patient will be kept on pain medications and n.p.o. 2. Hypertension since patient is n.p.o. I have kept patient on PRN IV hydralazine. 3. Sleep apnea on CPAP. 4. History of asthma presently not wheezing.  Continue home inhalers.     Code Status : Full  Family Communication  : wife at bedside  Disposition Plan  : pending further work up  Consults  :  Ortho  Procedures  : none  DVT Prophylaxis  :   SCD, start on Lovenox postop  Lab Results  Component Value Date   PLT 164 07/20/2018    Antibiotics  :   Anti-infectives (From admission, onward)   Start     Dose/Rate Route Frequency Ordered Stop   07/21/18 0800  ceFAZolin (ANCEF) 3  g in dextrose 5 % 50 mL IVPB    Note to Pharmacy:  Anesthesia to give preop   3 g 100 mL/hr over 30 Minutes Intravenous  Once 07/21/18 0759          Objective:   Vitals:   07/20/18 1423 07/20/18 2245 07/21/18 0039 07/21/18 0447  BP: (!) 142/94 114/62 112/70 (!) 119/59  Pulse: 72 74 68 72  Resp: 16 18 18 18   Temp: 98.3 F (36.8 C)  98 F (36.7 C) 98.4 F (36.9 C)  TempSrc: Oral  Oral Oral  SpO2: 100% 98% 99% 97%    Wt Readings from Last 3 Encounters:  05/22/18 (!) 249.5 kg  03/13/18 (!) 237 kg  01/08/18 (!) 217.7 kg     Intake/Output Summary (Last 24 hours) at 07/21/2018 1129 Last data filed at 07/21/2018 0030 Gross per 24 hour  Intake -  Output 100 ml  Net -100 ml     Physical Exam  Awake Alert, Oriented X 3, No new F.N deficits, Normal affect Symmetrical Chest wall movement, Good air movement bilaterally, CTAB RRR,No Gallops,Rubs or new Murmurs, No Parasternal Heave +ve B.Sounds, Abd Soft, No tenderness,  No rebound - guarding or rigidity. With bilateral knee effusion, both extend knee against gravity.    Data Review:    CBC Recent Labs  Lab 07/20/18 2220  WBC 10.8*  HGB 13.1  HCT 41.6  PLT 164  MCV 96.1  MCH 30.3  MCHC 31.5  RDW 14.1  LYMPHSABS 3.6  MONOABS 0.6  EOSABS 0.2  BASOSABS 0.0    Chemistries  Recent Labs  Lab 07/20/18 2220  NA 137  K 3.9  CL 100  CO2 25  GLUCOSE 157*  BUN 14  CREATININE 0.63  CALCIUM 9.0   ------------------------------------------------------------------------------------------------------------------ No results for input(s): CHOL, HDL, LDLCALC, TRIG, CHOLHDL, LDLDIRECT in the last 72 hours.  Lab Results  Component Value Date   HGBA1C 6.7 (H) 03/22/2017    ------------------------------------------------------------------------------------------------------------------ No results for input(s): TSH, T4TOTAL, T3FREE, THYROIDAB in the last 72 hours.  Invalid input(s): FREET3 ------------------------------------------------------------------------------------------------------------------ No results for input(s): VITAMINB12, FOLATE, FERRITIN, TIBC, IRON, RETICCTPCT in the last 72 hours.  Coagulation profile No results for input(s): INR, PROTIME in the last 168 hours.  No results for input(s): DDIMER in the last 72 hours.  Cardiac Enzymes No results for input(s): CKMB, TROPONINI, MYOGLOBIN in the last 168 hours.  Invalid input(s): CK ------------------------------------------------------------------------------------------------------------------ No results found for: BNP  Inpatient Medications  Scheduled Meds: . mometasone-formoterol  2 puff Inhalation BID   Continuous Infusions: . sodium chloride 50 mL/hr at 07/21/18 0846  .  ceFAZolin (ANCEF) IV     PRN Meds:.albuterol, hydrALAZINE, methocarbamol, morphine injection  Micro Results No results found for this or any previous visit (from the past 240 hour(s)).  Radiology Reports Ct Knee Left Wo Contrast  Result Date: 07/20/2018 CLINICAL DATA:  Patient tripped at work and could not get up. Patient heard a pop and has pain and burning in the knees. EXAM: CT OF THE LEFT KNEE WITHOUT CONTRAST TECHNIQUE: Multidetector CT imaging of the LEFT knee was performed according to the standard protocol. Multiplanar CT image reconstructions were also generated. COMPARISON:  None. FINDINGS: Bones/Joint/Cartilage Small to moderate suprapatellar joint effusion. No acute fracture joint dislocation. No suspicious osseous lesions are identified. Mild enthesopathy off the upper pole the patella along the quadriceps. Chondrocalcinosis of hyaline cartilage. Ligaments Suboptimally assessed by CT. Muscles and  Tendons The distal quadriceps muscles and tendon are thickened and  somewhat hyperdense in appearance raising suspicion for high-grade intramuscular strain/tear with likely intramuscular hemorrhage accounting for the hyperdense appearance, series 6/59. The patellar tendon appears intact. Soft tissues No soft tissue hematoma or fluid collection. IMPRESSION: 1. Thickened and somewhat hyperdense appearance of the distal quadriceps muscles and tendon raising suspicion a high-grade intramuscular strain/tear with likely intramuscular hemorrhage accounting for the hyperdense appearance. 2. Small to moderate suprapatellar joint effusion. 3. No acute fracture nor joint dislocation. Electronically Signed   By: Tollie Ethavid  Kwon M.D.   On: 07/20/2018 20:59   Ct Knee Right Wo Contrast  Result Date: 07/20/2018 CLINICAL DATA:  Larey SeatFell.  Severe pain. EXAM: CT OF THE right KNEE WITHOUT CONTRAST TECHNIQUE: Multidetector CT imaging of the right knee was performed according to the standard protocol. Multiplanar CT image reconstructions were also generated. COMPARISON:  None. FINDINGS: No acute fracture is identified. There is a calcific density in the posterior joint space posterior to the PCL. The PCL also demonstrates some streaky calcifications. Chondrocalcinosis is noted involving both the medial and lateral menisci. Findings could be due to CPPD arthropathy. The patella is lying low (patella [place]). The patellar tendon is wavy and redundant. I suspect the quadriceps tendon is completely ruptured and there is a large hematoma and joint effusion in this area. Recommend orthopedic consultation and MRI would be the best test for further evaluation. IMPRESSION: 1. Suspect completely ruptured quadriceps tendon. Associated large hematoma and suprapatellar knee joint effusion. Recommend orthopedic consultation. MRI would be helpful for further evaluation. 2. Chondrocalcinosis suggesting CPPD arthropathy. 3. No acute fractures are demonstrated.  Electronically Signed   By: Rudie MeyerP.  Gallerani M.D.   On: 07/20/2018 20:59   Dg Knee Complete 4 Views Left  Result Date: 07/20/2018 CLINICAL DATA:  Pt c/o bilateral knee pain after slipping and falling on a wet floor today. Hx of previous arthroscopy knee surgery; pt does not know which laterality was operated on. Best obtainable images due to pt condition. EXAM: LEFT KNEE - COMPLETE 4+ VIEW COMPARISON:  None. FINDINGS: No evidence of fracture, dislocation, or joint effusion. No evidence of arthropathy or other focal bone abnormality. Soft tissues are unremarkable. IMPRESSION: Negative. Electronically Signed   By: Norva PavlovElizabeth  Brown M.D.   On: 07/20/2018 15:45   Dg Knee Complete 4 Views Right  Result Date: 07/20/2018 CLINICAL DATA:  Pt c/o bilateral knee pain after slipping and falling on a wet floor today. Hx of previous arthroscopy knee surgery; pt does not know which laterality was operated on. Best obtainable images due to pt condition. EXAM: RIGHT KNEE - COMPLETE 4+ VIEW COMPARISON:  None. FINDINGS: No acute fracture or subluxation. Degenerative changes are identified at the patellofemoral compartment. Calcific density along the posterior intercondylar notch raises a question of remote ligamentous injury. IMPRESSION: No evidence for acute abnormality. Electronically Signed   By: Norva PavlovElizabeth  Brown M.D.   On: 07/20/2018 15:49      Huey Bienenstockawood Annalisse Minkoff M.D on 07/21/2018 at 11:29 AM  Between 7am to 7pm - Pager - [phone]  After 7pm go to [email or web address] - password [email or web address]  [facility] -  Office  [phone]

## 2018-07-21 NOTE — Consult Note (Signed)
ORTHOPAEDIC CONSULTATION  REQUESTING PHYSICIAN: Elgergawy, Leana Roe, MD  Chief Complaint: Bilateral quad ruptures  HPI: DESAI APA is a 48 y.o. male who presents with bilateral quad ruptures after falling at home in the bathroom.  He fell backwards and hyperflexed both of his knees.  He was unable to get up or weight bear afterwards. He presented to the ED for evaluation and CT scans showed bilateral quad ruptures.  He is morbidly obese 550 lbs, OSA, asthma, past h/o illicit drug use.    Past Medical History:  Diagnosis Date  . ALLERGIC RHINITIS 09/12/2007   Qualifier: Diagnosis of  By: Jonny Ruiz MD, Len Blalock   . ANXIETY 02/16/2007   Qualifier: Diagnosis of  By: Tyrone Apple, Lucy    . ASTHMA 12/17/2009   Qualifier: Diagnosis of  By: Jonny Ruiz MD, Len Blalock   . Cannabis abuse    currently  . Chills with fever   . DEPRESSION 09/12/2007   Qualifier: Diagnosis of  By: Jonny Ruiz MD, Len Blalock   . History of cocaine use    in his 1's  . Hyperglycemia   . Rectal fistula 2009  . Rectal pain   . Renal stone    2008   Past Surgical History:  Procedure Laterality Date  . FINGER SURGERY  2008/09?   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 knee surgury     ? which knee per Dr Thurston Hole in his teens  . TONSILLECTOMY     Social History   Socioeconomic History  . Marital status: Married    Spouse name: Not on file  . Number of children: Not on file  . Years of education: Not on file  . Highest education level: Not on file  Occupational History  . Not on file  Social Needs  . Financial resource strain: Not on file  . Food insecurity:    Worry: Not on file    Inability: Not on file  . Transportation needs:    Medical: Not on file    Non-medical: Not on file  Tobacco Use  . Smoking status: Current Every Day Smoker    Packs/day: 0.25    Last attempt to quit: 03/01/2011    Years since quitting: 7.3  . Smokeless tobacco: Never Used  .  Tobacco comment: doesn't smoke the whole cig, burns out while playing video game  Substance and Sexual Activity  . Alcohol use: Yes  . Drug use: Yes    Types: Marijuana  . Sexual activity: Not on file  Lifestyle  . Physical activity:    Days per week: Not on file    Minutes per session: Not on file  . Stress: Not on file  Relationships  . Social connections:    Talks on phone: Not on file    Gets together: Not on file    Attends religious service: Not on file    Active member of club or organization: Not on file    Attends meetings of clubs or organizations: Not on file    Relationship status: Not on file  Other Topics Concern  . Not on file  Social History Narrative  . Not on file   Family History  Problem Relation Age of Onset  . Diabetes type II Mother   . Pneumonia Father    - negative except otherwise stated in the family history section No Known Allergies Prior to Admission medications  Medication Sig Start Date End Date Taking? Authorizing Provider  albuterol (VENTOLIN HFA) 108 (90 Base) MCG/ACT inhaler Inhale 2 puffs into the lungs every 6 (six) hours as needed for wheezing. 03/13/18  Yes Coral Ceo, NP  budesonide-formoterol (SYMBICORT) 80-4.5 MCG/ACT inhaler Inhale 2 puffs into the lungs 2 (two) times daily. 05/22/18  Yes Oretha Milch, MD  Diclofenac Sodium 2 % SOLN Place 2 g onto the skin 2 (two) times daily. 12/05/17  Yes Judi Saa, DO  losartan (COZAAR) 25 MG tablet Take 1 tablet (25 mg total) by mouth 2 (two) times daily. 05/16/18  Yes [name], MD  naproxen (NAPROSYN) 500 MG tablet TAKE 1 TABLET BY MOUTH TWICE DAILY WITH A MEAL Patient taking differently: Take 500 mg by mouth 2 (two) times daily with a meal.  05/05/18  Yes [name], MD   Ct Knee Left Wo Contrast  Result Date: 07/20/2018 CLINICAL DATA:  Patient tripped at work and could not get up. Patient heard a pop and has pain and burning in the knees. EXAM: CT OF THE LEFT KNEE WITHOUT  CONTRAST TECHNIQUE: Multidetector CT imaging of the LEFT knee was performed according to the standard protocol. Multiplanar CT image reconstructions were also generated. COMPARISON:  None. FINDINGS: Bones/Joint/Cartilage Small to moderate suprapatellar joint effusion. No acute fracture joint dislocation. No suspicious osseous lesions are identified. Mild enthesopathy off the upper pole the patella along the quadriceps. Chondrocalcinosis of hyaline cartilage. Ligaments Suboptimally assessed by CT. Muscles and Tendons The distal quadriceps muscles and tendon are thickened and somewhat hyperdense in appearance raising suspicion for high-grade intramuscular strain/tear with likely intramuscular hemorrhage accounting for the hyperdense appearance, series 6/59. The patellar tendon appears intact. Soft tissues No soft tissue hematoma or fluid collection. IMPRESSION: 1. Thickened and somewhat hyperdense appearance of the distal quadriceps muscles and tendon raising suspicion a high-grade intramuscular strain/tear with likely intramuscular hemorrhage accounting for the hyperdense appearance. 2. Small to moderate suprapatellar joint effusion. 3. No acute fracture nor joint dislocation. Electronically Signed   By: Tollie Eth M.D.   On: 07/20/2018 20:59   Ct Knee Right Wo Contrast  Result Date: 07/20/2018 CLINICAL DATA:  Larey Seat.  Severe pain. EXAM: CT OF THE right KNEE WITHOUT CONTRAST TECHNIQUE: Multidetector CT imaging of the right knee was performed according to the standard protocol. Multiplanar CT image reconstructions were also generated. COMPARISON:  None. FINDINGS: No acute fracture is identified. There is a calcific density in the posterior joint space posterior to the PCL. The PCL also demonstrates some streaky calcifications. Chondrocalcinosis is noted involving both the medial and lateral menisci. Findings could be due to CPPD arthropathy. The patella is lying low (patella [place]). The patellar tendon is wavy and  redundant. I suspect the quadriceps tendon is completely ruptured and there is a large hematoma and joint effusion in this area. Recommend orthopedic consultation and MRI would be the best test for further evaluation. IMPRESSION: 1. Suspect completely ruptured quadriceps tendon. Associated large hematoma and suprapatellar knee joint effusion. Recommend orthopedic consultation. MRI would be helpful for further evaluation. 2. Chondrocalcinosis suggesting CPPD arthropathy. 3. No acute fractures are demonstrated. Electronically Signed   By: Rudie Meyer M.D.   On: 07/20/2018 20:59   Dg Knee Complete 4 Views Left  Result Date: 07/20/2018 CLINICAL DATA:  Pt c/o bilateral knee pain after slipping and falling on a wet floor today. Hx of previous arthroscopy knee surgery; pt does not know which laterality was operated on. Best obtainable  images due to pt condition. EXAM: LEFT KNEE - COMPLETE 4+ VIEW COMPARISON:  None. FINDINGS: No evidence of fracture, dislocation, or joint effusion. No evidence of arthropathy or other focal bone abnormality. Soft tissues are unremarkable. IMPRESSION: Negative. Electronically Signed   By: Norva PavlovElizabeth  Brown M.D.   On: 07/20/2018 15:45   Dg Knee Complete 4 Views Right  Result Date: 07/20/2018 CLINICAL DATA:  Pt c/o bilateral knee pain after slipping and falling on a wet floor today. Hx of previous arthroscopy knee surgery; pt does not know which laterality was operated on. Best obtainable images due to pt condition. EXAM: RIGHT KNEE - COMPLETE 4+ VIEW COMPARISON:  None. FINDINGS: No acute fracture or subluxation. Degenerative changes are identified at the patellofemoral compartment. Calcific density along the posterior intercondylar notch raises a question of remote ligamentous injury. IMPRESSION: No evidence for acute abnormality. Electronically Signed   By: Norva PavlovElizabeth  Brown M.D.   On: 07/20/2018 15:49   - pertinent xrays, CT, MRI studies were reviewed and independently  interpreted  Positive ROS: All other systems have been reviewed and were otherwise negative with the exception of those mentioned in the HPI and as above.  Physical Exam: General: Alert, no acute distress Cardiovascular: No pedal edema Respiratory: No cyanosis, no use of accessory musculature GI: No organomegaly, abdomen is soft and non-tender Skin: No lesions in the area of chief complaint Neurologic: Sensation intact distally Psychiatric: Patient is competent for consent with normal mood and affect Lymphatic: No axillary or cervical lymphadenopathy  MUSCULOSKELETAL:  - pain with palpation superior to the patellas with palpable defects - unable to extend knee against gravity - joint effusions  Assessment: Bilateral quad ruptures  Plan: - appreciate hospitalist admission for comorbidities - will plan to take to OR today for operative repair of both quad ruptures - r/b/a and postop recovery discussed with patient and family - will need to be placed on lovenox postop  Thank you for the consult and the opportunity to see Mr. Larry Fowler  N. Glee ArvinMichael , MD [facility] [phone] 7:55 AM

## 2018-07-21 NOTE — Progress Notes (Signed)
Patient allowed me to place SCD's on his legs.

## 2018-07-21 NOTE — Op Note (Signed)
   Date of Surgery: 07/21/2018  INDICATIONS: Larry Fowler is a 48 y.o.-year-old male with a bilateral quadriceps ruptures;  The patient did consent to the procedure after discussion of the risks and benefits.  PREOPERATIVE DIAGNOSIS: Bilateral quadriceps rupture  POSTOPERATIVE DIAGNOSIS: Same.  PROCEDURE: Repair of bilateral quadriceps rupture  SURGEON: N. Glee Arvin, M.D.  ASSIST: Starlyn Skeans [place], [place]; necessary for the timely completion of procedure and due to complexity of procedure.  ANESTHESIA:  general  IV FLUIDS AND URINE: See anesthesia.  ESTIMATED BLOOD LOSS: 1000 mL.  IMPLANTS: #5 FiberWire  DRAINS: None  COMPLICATIONS: None.  DESCRIPTION OF PROCEDURE: The patient was brought to the operating room and placed supine on the operating table.  The patient had been signed prior to the procedure and this was documented. The patient had the anesthesia placed by the anesthesiologist.  A time-out was performed to confirm that this was the correct patient, site, side and location. The patient did receive antibiotics prior to the incision and was re-dosed during the procedure as needed at indicated intervals.  A tourniquet was placed.  The patient had the operative extremity prepped and draped in the standard surgical fashion.    We first began with the right knee.  A midline incision directly over the anterior aspect of the knee was created.  Full-thickness flaps were elevated.  We carried our dissection down onto the peritenon.  There we encountered the full-thickness rupture of the quadriceps tendon off of the superior pole of the patella.  He had also ruptured his VMO muscle belly.  The large hematoma was evacuated from the knee joint.  The ends of the quadriceps tendon and the superior pole of the patella were freshened with a 15 blade.  I then used two #5 FiberWire in a Krakw stitch into the tendon.  There is good excursion of the tendon.  3 parallel bony tunnels were then  drilled through the patella and the sutures were brought through those bone tunnels with Swanson suture passers.  The knee was then placed in full extension and the sutures were then tied allowing the quadriceps tendon to approximate against the superior pole of the patella.  The surgical wound was then thoroughly irrigated and hemostasis was obtained.  We then turned our attention to the left extremity.  The same procedure was performed for this leg in an identical fashion.  The surgical wounds were then closed with interrupted 0 Vicryl for the subcutaneous fat, 2-0 Vicryl for the subcuticular layer, 2-0 nylon for the skin.  Sterile dressings were applied.  Both legs were placed in a knee immobilizer.  Patient tolerated procedure well had no immediate complications.  POSTOPERATIVE PLAN: Patient will need to mobilize with physical therapy as tolerated.  Mayra Reel, MD [facility] [phone] 2:15 PM

## 2018-07-21 NOTE — H&P (Signed)
History and Physical    TURAN QUINTILIANI TMH:[id number] DOB: 1970/11/04 DOA: 07/20/2018  PCP: [name], MD  Patient coming from: Home.  Chief Complaint: Fall.  HPI: WATKINS LACHAT is a 48 y.o. male with history of morbid obesity, asthma, hypertension, sleep apnea had a fall at home while walking to the bathroom.  Patient slipped and fell backwards.  Following which patient's knee started hurting.  Denies any loss of consciousness chest pain.  ED Course: In the ER patient was found to have bilateral knee pain for which patient had CAT scan done which shows bilateral quadricep tendon tear.  On-call orthopedic surgeon Dr.Xu was consulted and since patient has medical issues admitted to hospitalist service.  Review of Systems: As per HPI, rest all negative.   Past Medical History:  Diagnosis Date  . ALLERGIC RHINITIS 09/12/2007   Qualifier: Diagnosis of  By: Jonny Ruiz MD, Len Blalock   . ANXIETY 02/16/2007   Qualifier: Diagnosis of  By: Tyrone Apple, Lucy    . ASTHMA 12/17/2009   Qualifier: Diagnosis of  By: Jonny Ruiz MD, Len Blalock   . Cannabis abuse    currently  . Chills with fever   . DEPRESSION 09/12/2007   Qualifier: Diagnosis of  By: Jonny Ruiz MD, Len Blalock   . History of cocaine use    in his 58's  . Hyperglycemia   . Rectal fistula 2009  . Rectal pain   . Renal stone    2008    Past Surgical History:  Procedure Laterality Date  . FINGER SURGERY  2008/09?   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 knee surgury     ? which knee per Dr Thurston Hole in his teens  . TONSILLECTOMY       reports that he has been smoking. He has been smoking about 0.25 packs per day. He has never used smokeless tobacco. He reports current alcohol use. He reports current drug use. Drug: Marijuana.  No Known Allergies  Family History  Problem Relation Age of Onset  . Diabetes type II Mother   . Pneumonia Father     Prior to Admission medications     Medication Sig Start Date End Date Taking? Authorizing Provider  albuterol (VENTOLIN HFA) 108 (90 Base) MCG/ACT inhaler Inhale 2 puffs into the lungs every 6 (six) hours as needed for wheezing. 03/13/18  Yes Coral Ceo, NP  budesonide-formoterol (SYMBICORT) 80-4.5 MCG/ACT inhaler Inhale 2 puffs into the lungs 2 (two) times daily. 05/22/18  Yes Oretha Milch, MD  Diclofenac Sodium 2 % SOLN Place 2 g onto the skin 2 (two) times daily. 12/05/17  Yes Judi Saa, DO  losartan (COZAAR) 25 MG tablet Take 1 tablet (25 mg total) by mouth 2 (two) times daily. 05/16/18  Yes [name], MD  naproxen (NAPROSYN) 500 MG tablet TAKE 1 TABLET BY MOUTH TWICE DAILY WITH A MEAL Patient taking differently: Take 500 mg by mouth 2 (two) times daily with a meal.  05/05/18  Yes [name], MD    Physical Exam: Vitals:   07/20/18 1423 07/20/18 2245  BP: (!) 142/94 114/62  Pulse: 72 74  Resp: 16 18  Temp: 98.3 F (36.8 C)   TempSrc: Oral   SpO2: 100% 98%      Constitutional: Moderately built and nourished. Vitals:   07/20/18 1423 07/20/18 2245  BP: (!) 142/94 114/62  Pulse: 72 74  Resp: 16 18  Temp: 98.3 F (36.8 C)   TempSrc: Oral   SpO2: 100% 98%   Eyes: Anicteric no pallor. ENMT: No discharge from the ears eyes nose and mouth. Neck: No mass felt.  No neck rigidity. Respiratory: No rhonchi or crepitations. Cardiovascular: S1-S2 heard. Abdomen: Soft nontender bowel sounds present. Musculoskeletal: Pain and swelling of the knees. Skin: No rash. Neurologic: Alert awake oriented to time place and person.  Moves all extremities. Psychiatric: Appears normal with normal affect.   Labs on Admission: I have personally reviewed following labs and imaging studies  CBC: Recent Labs  Lab 07/20/18 2220  WBC 10.8*  NEUTROABS 6.3  HGB 13.1  HCT 41.6  MCV 96.1  PLT 164   Basic Metabolic Panel: Recent Labs  Lab 07/20/18 2220  NA 137  K 3.9  CL 100  CO2 25  GLUCOSE 157*  BUN 14   CREATININE 0.63  CALCIUM 9.0   GFR: CrCl cannot be calculated (Unknown ideal weight.). Liver Function Tests: No results for input(s): AST, ALT, ALKPHOS, BILITOT, PROT, ALBUMIN in the last 168 hours. No results for input(s): LIPASE, AMYLASE in the last 168 hours. No results for input(s): AMMONIA in the last 168 hours. Coagulation Profile: No results for input(s): INR, PROTIME in the last 168 hours. Cardiac Enzymes: No results for input(s): CKTOTAL, CKMB, CKMBINDEX, TROPONINI in the last 168 hours. BNP (last 3 results) No results for input(s): PROBNP in the last 8760 hours. HbA1C: No results for input(s): HGBA1C in the last 72 hours. CBG: No results for input(s): GLUCAP in the last 168 hours. Lipid Profile: No results for input(s): CHOL, HDL, LDLCALC, TRIG, CHOLHDL, LDLDIRECT in the last 72 hours. Thyroid Function Tests: No results for input(s): TSH, T4TOTAL, FREET4, T3FREE, THYROIDAB in the last 72 hours. Anemia Panel: No results for input(s): VITAMINB12, FOLATE, FERRITIN, TIBC, IRON, RETICCTPCT in the last 72 hours. Urine analysis:    Component Value Date/Time   COLORURINE YELLOW 03/21/2017 1358   APPEARANCEUR CLEAR 03/21/2017 1358   LABSPEC 1.020 03/21/2017 1358   PHURINE 6.5 03/21/2017 1358   GLUCOSEU NEGATIVE 03/21/2017 1358   HGBUR NEGATIVE 03/21/2017 1358   BILIRUBINUR NEGATIVE 03/21/2017 1358   KETONESUR NEGATIVE 03/21/2017 1358   PROTEINUR 30 (A) 03/13/2007 2338   UROBILINOGEN 0.2 03/21/2017 1358   NITRITE NEGATIVE 03/21/2017 1358   LEUKOCYTESUR NEGATIVE 03/21/2017 1358   Sepsis Labs: @LABRCNTIP (procalcitonin:4,lacticidven:4) )No results found for this or any previous visit (from the past 240 hour(s)).   Radiological Exams on Admission: Ct Knee Left Wo Contrast  Result Date: 07/20/2018 CLINICAL DATA:  Patient tripped at work and could not get up. Patient heard a pop and has pain and burning in the knees. EXAM: CT OF THE LEFT KNEE WITHOUT CONTRAST TECHNIQUE:  Multidetector CT imaging of the LEFT knee was performed according to the standard protocol. Multiplanar CT image reconstructions were also generated. COMPARISON:  None. FINDINGS: Bones/Joint/Cartilage Small to moderate suprapatellar joint effusion. No acute fracture joint dislocation. No suspicious osseous lesions are identified. Mild enthesopathy off the upper pole the patella along the quadriceps. Chondrocalcinosis of hyaline cartilage. Ligaments Suboptimally assessed by CT. Muscles and Tendons The distal quadriceps muscles and tendon are thickened and somewhat hyperdense in appearance raising suspicion for high-grade intramuscular strain/tear with likely intramuscular hemorrhage accounting for the hyperdense appearance, series 6/59. The patellar tendon appears intact. Soft tissues No soft tissue hematoma or fluid collection. IMPRESSION: 1. Thickened and somewhat hyperdense appearance of the distal quadriceps muscles and tendon raising suspicion  a high-grade intramuscular strain/tear with likely intramuscular hemorrhage accounting for the hyperdense appearance. 2. Small to moderate suprapatellar joint effusion. 3. No acute fracture nor joint dislocation. Electronically Signed   By: Tollie Eth M.D.   On: 07/20/2018 20:59   Ct Knee Right Wo Contrast  Result Date: 07/20/2018 CLINICAL DATA:  Larey Seat.  Severe pain. EXAM: CT OF THE right KNEE WITHOUT CONTRAST TECHNIQUE: Multidetector CT imaging of the right knee was performed according to the standard protocol. Multiplanar CT image reconstructions were also generated. COMPARISON:  None. FINDINGS: No acute fracture is identified. There is a calcific density in the posterior joint space posterior to the PCL. The PCL also demonstrates some streaky calcifications. Chondrocalcinosis is noted involving both the medial and lateral menisci. Findings could be due to CPPD arthropathy. The patella is lying low (patella [place]). The patellar tendon is wavy and redundant. I suspect  the quadriceps tendon is completely ruptured and there is a large hematoma and joint effusion in this area. Recommend orthopedic consultation and MRI would be the best test for further evaluation. IMPRESSION: 1. Suspect completely ruptured quadriceps tendon. Associated large hematoma and suprapatellar knee joint effusion. Recommend orthopedic consultation. MRI would be helpful for further evaluation. 2. Chondrocalcinosis suggesting CPPD arthropathy. 3. No acute fractures are demonstrated. Electronically Signed   By: Rudie Meyer M.D.   On: 07/20/2018 20:59   Dg Knee Complete 4 Views Left  Result Date: 07/20/2018 CLINICAL DATA:  Pt c/o bilateral knee pain after slipping and falling on a wet floor today. Hx of previous arthroscopy knee surgery; pt does not know which laterality was operated on. Best obtainable images due to pt condition. EXAM: LEFT KNEE - COMPLETE 4+ VIEW COMPARISON:  None. FINDINGS: No evidence of fracture, dislocation, or joint effusion. No evidence of arthropathy or other focal bone abnormality. Soft tissues are unremarkable. IMPRESSION: Negative. Electronically Signed   By: Norva Pavlov M.D.   On: 07/20/2018 15:45   Dg Knee Complete 4 Views Right  Result Date: 07/20/2018 CLINICAL DATA:  Pt c/o bilateral knee pain after slipping and falling on a wet floor today. Hx of previous arthroscopy knee surgery; pt does not know which laterality was operated on. Best obtainable images due to pt condition. EXAM: RIGHT KNEE - COMPLETE 4+ VIEW COMPARISON:  None. FINDINGS: No acute fracture or subluxation. Degenerative changes are identified at the patellofemoral compartment. Calcific density along the posterior intercondylar notch raises a question of remote ligamentous injury. IMPRESSION: No evidence for acute abnormality. Electronically Signed   By: Norva Pavlov M.D.   On: 07/20/2018 15:49      Assessment/Plan Principal Problem:   Quadriceps tendon rupture Active Problems:   Asthma    Morbid obesity (HCC)   OSA (obstructive sleep apnea)   HTN (hypertension)   Hyperglycemia    1. Bilateral quadricep tendon tear -orthopedic surgeon is planning to do surgery.  In anticipation of which patient will be kept on pain medications and n.p.o. 2. Hypertension since patient is n.p.o. I have kept patient on PRN IV hydralazine. 3. Sleep apnea on CPAP. 4. History of asthma presently not wheezing.  Continue home inhalers.   DVT prophylaxis: SCDs. Code Status: Full code. Family Communication: Discussed with patient. Disposition Plan: Probably need rehab. Consults called: Orthopedics. Admission status: Inpatient.   Eduard Clos MD [facility] Pager [phone].  If 7PM-7AM, please contact night-coverage [email or web address] Password [email or web address]  07/21/2018, 1:27 AM

## 2018-07-22 DIAGNOSIS — S76119S Strain of unspecified quadriceps muscle, fascia and tendon, sequela: Secondary | ICD-10-CM

## 2018-07-22 MED ORDER — SENNOSIDES-DOCUSATE SODIUM 8.6-50 MG PO TABS
3.0000 | ORAL_TABLET | Freq: Two times a day (BID) | ORAL | Status: DC
  Administered 2018-07-22 – 2018-08-01 (×18): 3 via ORAL
  Filled 2018-07-22 (×19): qty 3

## 2018-07-22 MED ORDER — ENOXAPARIN SODIUM 80 MG/0.8ML ~~LOC~~ SOLN
80.0000 mg | Freq: Once | SUBCUTANEOUS | Status: AC
  Administered 2018-07-22: 80 mg via SUBCUTANEOUS
  Filled 2018-07-22: qty 0.8

## 2018-07-22 MED ORDER — ENOXAPARIN SODIUM 120 MG/0.8ML ~~LOC~~ SOLN: 110.0000 mg | SUBCUTANEOUS | Status: DC

## 2018-07-22 MED ORDER — IPRATROPIUM-ALBUTEROL 0.5-2.5 (3) MG/3ML IN SOLN
3.0000 mL | Freq: Four times a day (QID) | RESPIRATORY_TRACT | Status: DC
  Filled 2018-07-22: qty 3

## 2018-07-22 MED ORDER — BUDESONIDE-FORMOTEROL FUMARATE 80-4.5 MCG/ACT IN AERO
2.0000 | INHALATION_SPRAY | Freq: Two times a day (BID) | RESPIRATORY_TRACT | Status: DC
  Administered 2018-07-26 – 2018-08-01 (×9): 2 via RESPIRATORY_TRACT
  Filled 2018-07-22: qty 6.9

## 2018-07-22 MED ORDER — ALBUTEROL SULFATE (2.5 MG/3ML) 0.083% IN NEBU
2.5000 mg | INHALATION_SOLUTION | RESPIRATORY_TRACT | Status: DC | PRN
  Administered 2018-07-23 – 2018-07-24 (×3): 2.5 mg via RESPIRATORY_TRACT
  Filled 2018-07-22 (×5): qty 3

## 2018-07-22 MED ORDER — PANTOPRAZOLE SODIUM 40 MG PO TBEC
40.0000 mg | DELAYED_RELEASE_TABLET | Freq: Every day | ORAL | Status: DC
  Administered 2018-07-22 – 2018-08-01 (×11): 40 mg via ORAL
  Filled 2018-07-22 (×11): qty 1

## 2018-07-22 MED ORDER — ALUM & MAG HYDROXIDE-SIMETH 200-200-20 MG/5ML PO SUSP: 15.0000 mL | ORAL | Status: DC | PRN

## 2018-07-22 MED ORDER — ALBUTEROL SULFATE HFA 108 (90 BASE) MCG/ACT IN AERS
2.0000 | INHALATION_SPRAY | RESPIRATORY_TRACT | Status: DC | PRN
  Administered 2018-07-22: 2 via RESPIRATORY_TRACT

## 2018-07-22 MED ORDER — ALUM & MAG HYDROXIDE-SIMETH 200-200-20 MG/5ML PO SUSP
30.0000 mL | Freq: Once | ORAL | Status: AC
  Administered 2018-07-22: 30 mL via ORAL
  Filled 2018-07-22: qty 30

## 2018-07-22 MED ORDER — GUAIFENESIN ER 600 MG PO TB12
1200.0000 mg | ORAL_TABLET | Freq: Two times a day (BID) | ORAL | Status: DC
  Administered 2018-07-22 – 2018-07-31 (×16): 1200 mg via ORAL
  Filled 2018-07-22 (×19): qty 2

## 2018-07-22 NOTE — Progress Notes (Signed)
PROGRESS NOTE                                                                                                                                                                                                             Patient Demographics:    Larry Fowler, is a 48 y.o. male, DOB - Apr 16, 1971, IAX:[id number]  Admit date - 07/20/2018   Admitting Physician Rise Patience, MD  Outpatient Primary MD for the patient is [name], MD  LOS - 2   Chief Complaint  Patient presents with  . Joint Swelling       Brief Narrative     48 y.o. male with history of morbid obesity, asthma, hypertension, sleep apnea had a fall at home while walking to the bathroom.  Patient slipped and fell backwards.  Following which patient's knee started hurting.  Denies any loss of consciousness chest pain.had CAT scan done which shows bilateral quadricep tendon tear.  On-call orthopedic surgeon Dr.Xu was consulted and since patient has medical issues admitted to hospitalist service.  Status bilateral repair of quadriceps tendons by Dr. Sherrian Divers 07/21/2018   Subjective:    Larry Fowler today complains of pain in bilateral knee areas, reports some cough, productive.   Assessment  & Plan :    Principal Problem:   Quadriceps tendon rupture Active Problems:   Asthma   Morbid obesity (HCC)   OSA (obstructive sleep apnea)   HTN (hypertension)   Hyperglycemia   Rupture of left quadriceps muscle  Bilateral quadricep tendon tear -Patient met by orthopedic, status post bilateral repair of quadriceps rupture by Dr. Erlinda Hong 07/21/2018, management per hepatic, continue with PRN pain medication, PT consulted, on DVT prophylaxis  Hypertension -Blood pressure on the lower side, continue to hold losartan  Sleep apnea -Continue with CPAP  History of asthma -With no active wheezing, but he is a smoker, currently having cough, with some phlegm, encouraged use incentive  spirometry, will start on Mucinex, will start on scheduled duo nebs as well for total of 4 doses.     Code Status : Full  Family Communication  : None at bedside  Disposition Plan  : Pending PT consult, but likely will need SNF placement  Consults  :  Ortho  Procedures  : none  DVT Prophylaxis  : Lovenox  Lab Results  Component  Value Date   PLT 141 (L) 07/21/2018    Antibiotics  :   Anti-infectives (From admission, onward)   Start     Dose/Rate Route Frequency Ordered Stop   07/21/18 1830  ceFAZolin (ANCEF) 3 g in dextrose 5 % 50 mL IVPB     3 g 100 mL/hr over 30 Minutes Intravenous Every 8 hours 07/21/18 1743 07/22/18 2159   07/21/18 1400  vancomycin (VANCOCIN) powder  Status:  Discontinued       As needed 07/21/18 1400 07/21/18 1536   07/21/18 0800  ceFAZolin (ANCEF) 3 g in dextrose 5 % 50 mL IVPB    Note to Pharmacy:  Anesthesia to give preop   3 g 100 mL/hr over 30 Minutes Intravenous  Once 07/21/18 0759 07/21/18 1226        Objective:   Vitals:   07/21/18 2109 07/22/18 0320 07/22/18 0903 07/22/18 0950  BP: 126/61 (!) 94/59 (!) 103/52   Pulse: 71 89 80   Resp: 18  18   Temp: 98.6 F (37 C) 98.6 F (37 C) 98.7 F (37.1 C)   TempSrc: Oral Axillary Oral   SpO2: 100% 95% 100% 100%  Weight:      Height:        Wt Readings from Last 3 Encounters:  07/21/18 (!) 226.8 kg  05/22/18 (!) 249.5 kg  03/13/18 (!) 237 kg     Intake/Output Summary (Last 24 hours) at 07/22/2018 1456 Last data filed at 07/22/2018 0356 Gross per 24 hour  Intake 2835 ml  Output 530 ml  Net 2305 ml     Physical Exam  Awake Alert, Oriented X 3, No new F.N deficits, Normal affect Symmetrical Chest wall movement, Good air movement bilaterally, CTAB RRR,No Gallops,Rubs or new Murmurs, No Parasternal Heave +ve B.Sounds, Abd Soft, No tenderness, No rebound - guarding or rigidity. No Cyanosis, Clubbing , right lateral lower extremity wrapped with Ace wrap, with intact sensation  distally, intact pulses,     Data Review:    CBC Recent Labs  Lab 07/20/18 2220 07/21/18 1357 07/21/18 2018  WBC 10.8*  --  13.3*  HGB 13.1 9.9* 9.8*  HCT 41.6 29.0* 29.6*  PLT 164  --  141*  MCV 96.1  --  95.5  MCH 30.3  --  31.6  MCHC 31.5  --  33.1  RDW 14.1  --  14.0  LYMPHSABS 3.6  --   --   MONOABS 0.6  --   --   EOSABS 0.2  --   --   BASOSABS 0.0  --   --     Chemistries  Recent Labs  Lab 07/20/18 2220 07/21/18 1357 07/21/18 2018  NA 137 135  --   K 3.9 4.8  --   CL 100  --   --   CO2 25  --   --   GLUCOSE 157* 186*  --   BUN 14  --   --   CREATININE 0.63  --  0.78  CALCIUM 9.0  --   --    ------------------------------------------------------------------------------------------------------------------ No results for input(s): CHOL, HDL, LDLCALC, TRIG, CHOLHDL, LDLDIRECT in the last 72 hours.  Lab Results  Component Value Date   HGBA1C 6.7 (H) 03/22/2017   ------------------------------------------------------------------------------------------------------------------ No results for input(s): TSH, T4TOTAL, T3FREE, THYROIDAB in the last 72 hours.  Invalid input(s): FREET3 ------------------------------------------------------------------------------------------------------------------ No results for input(s): VITAMINB12, FOLATE, FERRITIN, TIBC, IRON, RETICCTPCT in the last 72 hours.  Coagulation profile No results for input(s): INR,  PROTIME in the last 168 hours.  No results for input(s): DDIMER in the last 72 hours.  Cardiac Enzymes No results for input(s): CKMB, TROPONINI, MYOGLOBIN in the last 168 hours.  Invalid input(s): CK ------------------------------------------------------------------------------------------------------------------ No results found for: BNP  Inpatient Medications  Scheduled Meds: . acetaminophen  1,000 mg Oral Q6H  . alum & mag hydroxide-simeth  30 mL Oral Once  . docusate sodium  100 mg Oral BID  . enoxaparin  (LOVENOX) injection  40 mg Subcutaneous Q24H  . guaiFENesin  1,200 mg Oral BID  . ipratropium-albuterol  3 mL Nebulization Q6H  . mometasone-formoterol  2 puff Inhalation BID  . oxyCODONE  10 mg Oral Q12H  . pantoprazole  40 mg Oral Daily   Continuous Infusions: . sodium chloride 50 mL/hr at 07/21/18 0846  . sodium chloride 125 mL/hr at 07/21/18 1800  .  ceFAZolin (ANCEF) IV 3 g (07/22/18 1439)  . lactated ringers 10 mL/hr at 07/21/18 1159   PRN Meds:.acetaminophen, albuterol, alum & mag hydroxide-simeth, diphenhydrAMINE, hydrALAZINE, HYDROmorphone (DILAUDID) injection, magnesium citrate, methocarbamol, metoCLOPramide **OR** metoCLOPramide (REGLAN) injection, morphine injection, ondansetron **OR** ondansetron (ZOFRAN) IV, oxyCODONE, oxyCODONE, polyethylene glycol, sorbitol  Micro Results No results found for this or any previous visit (from the past 240 hour(s)).  Radiology Reports Ct Knee Left Wo Contrast  Result Date: 07/20/2018 CLINICAL DATA:  Patient tripped at work and could not get up. Patient heard a pop and has pain and burning in the knees. EXAM: CT OF THE LEFT KNEE WITHOUT CONTRAST TECHNIQUE: Multidetector CT imaging of the LEFT knee was performed according to the standard protocol. Multiplanar CT image reconstructions were also generated. COMPARISON:  None. FINDINGS: Bones/Joint/Cartilage Small to moderate suprapatellar joint effusion. No acute fracture joint dislocation. No suspicious osseous lesions are identified. Mild enthesopathy off the upper Fowler the patella along the quadriceps. Chondrocalcinosis of hyaline cartilage. Ligaments Suboptimally assessed by CT. Muscles and Tendons The distal quadriceps muscles and tendon are thickened and somewhat hyperdense in appearance raising suspicion for high-grade intramuscular strain/tear with likely intramuscular hemorrhage accounting for the hyperdense appearance, series 6/59. The patellar tendon appears intact. Soft tissues No soft  tissue hematoma or fluid collection. IMPRESSION: 1. Thickened and somewhat hyperdense appearance of the distal quadriceps muscles and tendon raising suspicion a high-grade intramuscular strain/tear with likely intramuscular hemorrhage accounting for the hyperdense appearance. 2. Small to moderate suprapatellar joint effusion. 3. No acute fracture nor joint dislocation. Electronically Signed   By: Ashley Royalty M.D.   On: 07/20/2018 20:59   Ct Knee Right Wo Contrast  Result Date: 07/20/2018 CLINICAL DATA:  [place].  Severe pain. EXAM: CT OF THE right KNEE WITHOUT CONTRAST TECHNIQUE: Multidetector CT imaging of the right knee was performed according to the standard protocol. Multiplanar CT image reconstructions were also generated. COMPARISON:  None. FINDINGS: No acute fracture is identified. There is a calcific density in the posterior joint space posterior to the PCL. The PCL also demonstrates some streaky calcifications. Chondrocalcinosis is noted involving both the medial and lateral menisci. Findings could be due to CPPD arthropathy. The patella is lying low (patella [place]). The patellar tendon is wavy and redundant. I suspect the quadriceps tendon is completely ruptured and there is a large hematoma and joint effusion in this area. Recommend orthopedic consultation and MRI would be the best test for further evaluation. IMPRESSION: 1. Suspect completely ruptured quadriceps tendon. Associated large hematoma and suprapatellar knee joint effusion. Recommend orthopedic consultation. MRI would be helpful for further evaluation. 2. Chondrocalcinosis suggesting  CPPD arthropathy. 3. No acute fractures are demonstrated. Electronically Signed   By: Marijo Sanes M.D.   On: 07/20/2018 20:59   Dg Knee Complete 4 Views Left  Result Date: 07/20/2018 CLINICAL DATA:  Pt c/o bilateral knee pain after slipping and falling on a wet floor today. Hx of previous arthroscopy knee surgery; pt does not know which laterality was  operated on. Best obtainable images due to pt condition. EXAM: LEFT KNEE - COMPLETE 4+ VIEW COMPARISON:  None. FINDINGS: No evidence of fracture, dislocation, or joint effusion. No evidence of arthropathy or other focal bone abnormality. Soft tissues are unremarkable. IMPRESSION: Negative. Electronically Signed   By: Nolon Nations M.D.   On: 07/20/2018 15:45   Dg Knee Complete 4 Views Right  Result Date: 07/20/2018 CLINICAL DATA:  Pt c/o bilateral knee pain after slipping and falling on a wet floor today. Hx of previous arthroscopy knee surgery; pt does not know which laterality was operated on. Best obtainable images due to pt condition. EXAM: RIGHT KNEE - COMPLETE 4+ VIEW COMPARISON:  None. FINDINGS: No acute fracture or subluxation. Degenerative changes are identified at the patellofemoral compartment. Calcific density along the posterior intercondylar notch raises a question of remote ligamentous injury. IMPRESSION: No evidence for acute abnormality. Electronically Signed   By: Nolon Nations M.D.   On: 07/20/2018 15:49      Phillips Climes M.D on 07/22/2018 at 2:56 PM  Between 7am to 7pm - Pager - [phone]  After 7pm go to [email or web address] - password [facility]  [facility] -  Office  [phone]

## 2018-07-22 NOTE — Progress Notes (Signed)
Orthopedic Tech Progress Note Patient Details:  KEYON SADA 06/14/1970 [id number]   While putting up the frame I noticed that he max out the weight which is 250 pull down. So I removed it and notified the RN so she could order a different frame Patient ID: ROMIO DEFORD, male   DOB: [birth year], 48 y.o.   MRN: [id number]   [name] 07/22/2018, 9:44 AM

## 2018-07-22 NOTE — Progress Notes (Addendum)
     Subjective: 1 Day Post-Op Procedure(s) (LRB): REPAIR BILATERAL QUADRICEPS (Bilateral)Awake, alert and oriented x 4, your the first person I've talked to. I discussed the injury quad ruptures superior pole patella likely some amount of preexisting weakening of the tendons usually before rupture, inflamation or attrition of tendon prior to rupture, increased stresses due to size. No cipro exposure   Patient reports pain as moderate.    Objective:   VITALS:  Temp:  [97.2 F (36.2 C)-98.7 F (37.1 C)] 98.7 F (37.1 C) (02/15 0903) Pulse Rate:  [62-90] 80 (02/15 0903) Resp:  [9-20] 18 (02/15 0903) BP: (94-129)/(52-75) 103/52 (02/15 0903) SpO2:  [95 %-100 %] 100 % (02/15 0950) Weight:  [226.8 kg] 226.8 kg (02/14 1153)  Neurologically intact ABD soft Neurovascular intact Sensation intact distally Intact pulses distally Dorsiflexion/Plantar flexion intact Compartment soft Apparently too big for the braces ordered and may need special braces ordered.    LABS Recent Labs    07/20/18 2220 07/21/18 1357 07/21/18 2018  HGB 13.1 9.9* 9.8*  WBC 10.8*  --  13.3*  PLT 164  --  141*   Recent Labs    07/20/18 2220 07/21/18 1357 07/21/18 2018  NA 137 135  --   K 3.9 4.8  --   CL 100  --   --   CO2 25  --   --   BUN 14  --   --   CREATININE 0.63  --  0.78  GLUCOSE 157* 186*  --    No results for input(s): LABPT, INR in the last 72 hours.   Assessment/Plan: 1 Day Post-Op Procedure(s) (LRB): REPAIR BILATERAL QUADRICEPS (Bilateral)  Advance diet Up with therapy D/C IV fluids Discharge to SNF Bariatric bed with trapeze, agree.  Vira Browns 07/22/2018, 9:56 AMPatient ID: Larry Fowler, male   DOB: 09-18-1970, 48 y.o.   MRN: [id number]

## 2018-07-22 NOTE — Progress Notes (Signed)
Orthopedic Tech Progress Note Patient Details:  MOTAZ ALBERS 11-19-1970 [id number] I called down to portable materials and placed an order for a bigger bed and trapeze which all comes together. She said it should come either today or tomorrow and I let the RN know also Patient ID: XUE OVERTON, male   DOB: [birth year], 48 y.o.   MRN: [id number]   [name] 07/22/2018, 10:08 AM

## 2018-07-22 NOTE — Progress Notes (Signed)
Pt states he does not need assistance with CPAP tonight. Advised pt to notify for RN if any further assistance is needed.

## 2018-07-22 NOTE — Plan of Care (Signed)
  Problem: Activity: Goal: Risk for activity intolerance will decrease Outcome: Not Progressing   Problem: Pain Managment: Goal: General experience of comfort will improve Outcome: Progressing   Problem: Safety: Goal: Ability to remain free from injury will improve Outcome: Progressing   Problem: Skin Integrity: Goal: Risk for impaired skin integrity will decrease Outcome: Progressing

## 2018-07-22 NOTE — Progress Notes (Signed)
ANTICOAGULATION CONSULT NOTE - Initial Consult  Pharmacy Consult for lovenox Indication: VTE prophylaxis  No Known Allergies  Patient Measurements: Height: 6\' 2"  (188 cm) Weight: (!) 500 lb (226.8 kg) IBW/kg (Calculated) : 82.2 Heparin Dosing Weight:   Vital Signs: Temp: 98.2 F (36.8 C) (02/15 1458) Temp Source: Oral (02/15 1458) BP: 100/50 (02/15 1458) Pulse Rate: 78 (02/15 1458)  Labs: Recent Labs    07/20/18 2220 07/21/18 1357 07/21/18 2018  HGB 13.1 9.9* 9.8*  HCT 41.6 29.0* 29.6*  PLT 164  --  141*  CREATININE 0.63  --  0.78    Estimated Creatinine Clearance: 228.5 mL/min (by C-G formula based on SCr of 0.78 mg/dL).   Medical History: Past Medical History:  Diagnosis Date  . ALLERGIC RHINITIS 09/12/2007   Qualifier: Diagnosis of  By: Jonny Ruiz MD, Len Blalock   . ANXIETY 02/16/2007   Qualifier: Diagnosis of  By: Tyrone Apple, Lucy    . ASTHMA 12/17/2009   Qualifier: Diagnosis of  By: Jonny Ruiz MD, Len Blalock   . Cannabis abuse    currently  . Chills with fever   . DEPRESSION 09/12/2007   Qualifier: Diagnosis of  By: Jonny Ruiz MD, Len Blalock   . History of cocaine use    in his 47's  . Hyperglycemia   . Rectal fistula 2009  . Rectal pain   . Renal stone    2008    Medications:  Medications Prior to Admission  Medication Sig Dispense Refill Last Dose  . albuterol (VENTOLIN HFA) 108 (90 Base) MCG/ACT inhaler Inhale 2 puffs into the lungs every 6 (six) hours as needed for wheezing. 1 Inhaler 11 07/20/2018 at Unknown time  . budesonide-formoterol (SYMBICORT) 80-4.5 MCG/ACT inhaler Inhale 2 puffs into the lungs 2 (two) times daily. 1 Inhaler 6 07/20/2018 at Unknown time  . Diclofenac Sodium 2 % SOLN Place 2 g onto the skin 2 (two) times daily. 112 g 3 unk  . losartan (COZAAR) 25 MG tablet Take 1 tablet (25 mg total) by mouth 2 (two) times daily. 180 tablet 3 07/20/2018 at Unknown time  . naproxen (NAPROSYN) 500 MG tablet TAKE 1 TABLET BY MOUTH TWICE DAILY WITH A MEAL (Patient taking  differently: Take 500 mg by mouth 2 (two) times daily with a meal. ) 100 tablet 1 07/20/2018 at Unknown time   Scheduled:  . acetaminophen  1,000 mg Oral Q6H  . alum & mag hydroxide-simeth  30 mL Oral Once  . enoxaparin (LOVENOX) injection  40 mg Subcutaneous Q24H  . guaiFENesin  1,200 mg Oral BID  . ipratropium-albuterol  3 mL Nebulization Q6H  . mometasone-formoterol  2 puff Inhalation BID  . oxyCODONE  10 mg Oral Q12H  . pantoprazole  40 mg Oral Daily  . senna-docusate  3 tablet Oral BID   Infusions:  . sodium chloride 50 mL/hr at 07/21/18 0846  . sodium chloride 125 mL/hr at 07/21/18 1800  . lactated ringers 10 mL/hr at 07/21/18 1159    Assessment: Morbidly obese patient is here for bilateral quadricep tear after a fall. Lovenox has been ordered for DVT prophylaxis. Scr <1, hgb 9.8, plt 141, wt 227kg. We will use 0.5mg /kg/dose.   Goal of Therapy:  Heparin level 0.3-0.6 units/ml Monitor platelets by anticoagulation protocol: Yes   Plan:  Lovenox 110mg  SQ qday CBC weekly  Ulyses Southward, PharmD, BCIDP, AAHIVP, CPP Infectious Disease Pharmacist 07/22/2018 3:24 PM

## 2018-07-22 NOTE — Progress Notes (Signed)
Patient is on NIV at this time tolerating it well.  

## 2018-07-22 NOTE — Progress Notes (Signed)
Orthopedic Tech Progress Note Patient Details:  Larry Fowler [birth year] [id number] Called in biotech order and applied the OHF     Post Interventions Patient Tolerated: Well Instructions Provided: Care of device, Adjustment of device  Patient ID: Larry Fowler, male   DOB: 08-08-1970, 48 y.o.   MRN: [id number]   [name] 07/22/2018, 9:18 AM

## 2018-07-23 DIAGNOSIS — S76112S Strain of left quadriceps muscle, fascia and tendon, sequela: Secondary | ICD-10-CM

## 2018-07-23 DIAGNOSIS — D62 Acute posthemorrhagic anemia: Secondary | ICD-10-CM | POA: Diagnosis not present

## 2018-07-23 LAB — CBC
HCT: 23.2 % — ABNORMAL LOW (ref 39.0–52.0)
Hemoglobin: 7.4 g/dL — ABNORMAL LOW (ref 13.0–17.0)
MCH: 31.2 pg (ref 26.0–34.0)
MCHC: 31.9 g/dL (ref 30.0–36.0)
MCV: 97.9 fL (ref 80.0–100.0)
Platelets: 123 10*3/uL — ABNORMAL LOW (ref 150–400)
RBC: 2.37 MIL/uL — ABNORMAL LOW (ref 4.22–5.81)
RDW: 14.3 % (ref 11.5–15.5)
WBC: 9.6 10*3/uL (ref 4.0–10.5)
nRBC: 0 % (ref 0.0–0.2)

## 2018-07-23 LAB — HEMOGLOBIN AND HEMATOCRIT, BLOOD
HCT: 24.7 % — ABNORMAL LOW (ref 39.0–52.0)
Hemoglobin: 7.8 g/dL — ABNORMAL LOW (ref 13.0–17.0)

## 2018-07-23 LAB — PREPARE RBC (CROSSMATCH)

## 2018-07-23 LAB — BASIC METABOLIC PANEL
Anion gap: 4 — ABNORMAL LOW (ref 5–15)
BUN: 16 mg/dL (ref 6–20)
CO2: 28 mmol/L (ref 22–32)
Calcium: 8.1 mg/dL — ABNORMAL LOW (ref 8.9–10.3)
Chloride: 103 mmol/L (ref 98–111)
Creatinine, Ser: 0.72 mg/dL (ref 0.61–1.24)
GFR calc Af Amer: >60 mL/min (ref >60)
GFR calc non Af Amer: >60 mL/min (ref >60)
Glucose, Bld: 230 mg/dL — ABNORMAL HIGH (ref 70–99)
POTASSIUM: 4.3 mmol/L (ref 3.5–5.1)
Sodium: 135 mmol/L (ref 135–145)

## 2018-07-23 LAB — GLUCOSE, CAPILLARY
Glucose-Capillary: 223 mg/dL — ABNORMAL HIGH (ref 70–99)
Glucose-Capillary: 218 mg/dL — ABNORMAL HIGH (ref 70–99)

## 2018-07-23 MED ORDER — POLYETHYLENE GLYCOL 3350 17 G PO PACK
17.0000 g | PACK | Freq: Two times a day (BID) | ORAL | Status: DC
  Administered 2018-07-23 – 2018-07-24 (×3): 17 g via ORAL
  Filled 2018-07-23 (×2): qty 1

## 2018-07-23 MED ORDER — SODIUM CHLORIDE 0.9% IV SOLUTION
Freq: Once | INTRAVENOUS | Status: AC
  Administered 2018-07-23: 13:00:00 via INTRAVENOUS

## 2018-07-23 NOTE — Progress Notes (Addendum)
     Subjective: 2 Days Post-Op Procedure(s) (LRB): REPAIR BILATERAL QUADRICEPS (Bilateral) Awake, alert and oriented x 4. He has bilateral knee braces inplace, bariatric bed with overhead trapieze. Will need Hoyer to lift to a chair. Able to weight bear with knees in extension with braces.   Patient reports pain as marked.    Objective:   VITALS:  Temp:  [98.2 F (36.8 C)-98.7 F (37.1 C)] 98.4 F (36.9 C) (02/16 0507) Pulse Rate:  [75-79] 79 (02/16 0507) Resp:  [18] 18 (02/15 1458) BP: (100-121)/(50-63) 121/63 (02/16 0507) SpO2:  [90 %-98 %] 96 % (02/16 0507)  Neurologically intact ABD soft Neurovascular intact Sensation intact distally Intact pulses distally Dorsiflexion/Plantar flexion intact Incision: dressing C/D/I No cellulitis present Compartment soft   LABS Recent Labs    07/20/18 2220 07/21/18 1357 07/21/18 2018 07/23/18 0500  HGB 13.1 9.9* 9.8* 7.4*  WBC 10.8*  --  13.3* 9.6  PLT 164  --  141* 123*   Recent Labs    07/20/18 2220 07/21/18 1357 07/21/18 2018 07/23/18 0500  NA 137 135  --  135  K 3.9 4.8  --  4.3  CL 100  --   --  103  CO2 25  --   --  28  BUN 14  --   --  16  CREATININE 0.63  --  0.78 0.72  GLUCOSE 157* 186*  --  230*   No results for input(s): LABPT, INR in the last 72 hours.   Assessment/Plan: 2 Days Post-Op Procedure(s) (LRB): REPAIR BILATERAL QUADRICEPS (Bilateral) Periop acute blood loss anemia, probably hematoma and poor tourniquet due to size of thigh intraop. Hgb 7.4  Advance diet Up with therapy D/C IV fluids Discharge to SNF  PT/OT to mobilize. OK to transfer to another room with  Michiel Sites lift accessible design for bariatric  Patient.  Transfusion for decreased Hgb.   Vira Browns 07/23/2018, 9:53 AMPatient ID: Larry Fowler, male   DOB: Mar 26, 1971, 48 y.o.   MRN: [id number]

## 2018-07-23 NOTE — Progress Notes (Signed)
PT working with patient.  Required 4+ assistance to turn on side and sit on side of bed.  Patient was given pain medication just before movement.  Requires support of staff for stabilization of BLE braces during movement and ice packs to decrease swelling.  Patient cursing at staff during movement and states, "someone is going to hear about this lack of compassion for me just because I'm overweight".  Patient upset with all staff present.  "No one wants to see me get well!".  Complained to his wife that he could not get any sleep, then complained that he "didn't see any staff until 5 am this morning."  Pain tolerance is low, but with much encouragement, is able to sit on side of bed.  Offers multiple complaints to wife when she is present.

## 2018-07-23 NOTE — Progress Notes (Signed)
Patient transferred onto bariatric bed with 4+ assists.  Wife in to visit.  Dilaudid given for pain induced by transfer.

## 2018-07-23 NOTE — Progress Notes (Signed)
Spoke with pt this morning who requested his nurse and nurse tech to be changed. This CN took over the pt's care and moved pt to room one per MD order.

## 2018-07-23 NOTE — Progress Notes (Addendum)
PROGRESS NOTE                                                                                                                                                                                                             Patient Demographics:    Larry Fowler, is a 48 y.o. male, DOB - July 16, 1970, TMH:[id number]  Admit date - 07/20/2018   Admitting Physician Rise Patience, MD  Outpatient Primary MD for the patient is [name], MD  LOS - 3   Chief Complaint  Patient presents with  . Joint Swelling       Brief Narrative     48 y.o. male with history of morbid obesity, asthma, hypertension, sleep apnea had a fall at home while walking to the bathroom.  Patient slipped and fell backwards.  Following which patient's knee started hurting.  Denies any loss of consciousness chest pain.had CAT scan done which shows bilateral quadricep tendon tear.  On-call orthopedic surgeon Dr.Xu was consulted and since patient has medical issues admitted to hospitalist service.  Status bilateral repair of quadriceps tendons by Dr. Erlinda Hong 07/21/2018   Subjective:    Larry Fowler today complains of pain in bilateral knee areas, reports no bowel movement yet, cough has improved  Assessment  & Plan :    Principal Problem:   Quadriceps tendon rupture Active Problems:   Asthma   Morbid obesity (HCC)   OSA (obstructive sleep apnea)   HTN (hypertension)   Hyperglycemia   Rupture of left quadriceps muscle   Postoperative anemia due to acute blood loss  Bilateral quadricep tendon tear -Patient met by orthopedic, status post bilateral repair of quadriceps rupture by Dr. Erlinda Hong 07/21/2018, management per hepatic, continue with PRN pain medication, PT consulted,   Acute blood loss anemia -Postoperative, hemoglobin 7.4 today, he is symptomatic as he felt some lightheadedness with PT today, will transfuse 1 unit PRBC, monitor CBC closely, I will hold his Lovenox  today.  Hypertension -Blood pressure on the lower side, continue to hold losartan  Sleep apnea -Continue with CPAP  History of asthma -With no active wheezing, but he is a smoker, currently having cough, with some phlegm, encouraged use incentive spirometry, will start on Mucinex, will start on scheduled duo nebs as well for total of 4 doses.  Constipation -will start on good bowel regimen  Hyperglycemia -Patient blood glucose is 230 this morning on BMP, will check A1c, will monitor CBG 4 times daily  Code Status : Full  Family Communication  : None at bedside  Disposition Plan  : Pending PT consult, but likely will need SNF placement  Consults  :  Ortho  Procedures  : none  DVT Prophylaxis  : Lovenox  Lab Results  Component Value Date   PLT 123 (L) 07/23/2018    Antibiotics  :   Anti-infectives (From admission, onward)   Start     Dose/Rate Route Frequency Ordered Stop   07/21/18 1830  ceFAZolin (ANCEF) 3 g in dextrose 5 % 50 mL IVPB     3 g 100 mL/hr over 30 Minutes Intravenous Every 8 hours 07/21/18 1743 07/22/18 1509   07/21/18 1400  vancomycin (VANCOCIN) powder  Status:  Discontinued       As needed 07/21/18 1400 07/21/18 1536   07/21/18 0800  ceFAZolin (ANCEF) 3 g in dextrose 5 % 50 mL IVPB    Note to Pharmacy:  Anesthesia to give preop   3 g 100 mL/hr over 30 Minutes Intravenous  Once 07/21/18 0759 07/21/18 1226        Objective:   Vitals:   07/22/18 0950 07/22/18 1458 07/22/18 2123 07/23/18 0507  BP:  (!) 100/50 (!) 119/51 121/63  Pulse:  78 75 79  Resp:  18    Temp:  98.2 F (36.8 C) 98.7 F (37.1 C) 98.4 F (36.9 C)  TempSrc:  Oral Oral Oral  SpO2: 100% 98% 90% 96%  Weight:      Height:        Wt Readings from Last 3 Encounters:  07/21/18 (!) 226.8 kg  05/22/18 (!) 249.5 kg  03/13/18 (!) 237 kg     Intake/Output Summary (Last 24 hours) at 07/23/2018 1148 Last data filed at 07/23/2018 0800 Gross per 24 hour  Intake 360 ml  Output  500 ml  Net -140 ml     Physical Exam  Awake Alert, Oriented X 3, No new F.N deficits, Normal affect Symmetrical Chest wall movement, Good air movement bilaterally, CTAB RRR,No Gallops,Rubs or new Murmurs, No Parasternal Heave +ve B.Sounds, Abd Soft, No tenderness, No rebound - guarding or rigidity. No Cyanosis, Clubbing , right lateral lower extremity wrapped with Ace wrap, with intact sensation distally, intact pulses,     Data Review:    CBC Recent Labs  Lab 07/20/18 2220 07/21/18 1357 07/21/18 2018 07/23/18 0500  WBC 10.8*  --  13.3* 9.6  HGB 13.1 9.9* 9.8* 7.4*  HCT 41.6 29.0* 29.6* 23.2*  PLT 164  --  141* 123*  MCV 96.1  --  95.5 97.9  MCH 30.3  --  31.6 31.2  MCHC 31.5  --  33.1 31.9  RDW 14.1  --  14.0 14.3  LYMPHSABS 3.6  --   --   --   MONOABS 0.6  --   --   --   EOSABS 0.2  --   --   --   BASOSABS 0.0  --   --   --     Chemistries  Recent Labs  Lab 07/20/18 2220 07/21/18 1357 07/21/18 2018 07/23/18 0500  NA 137 135  --  135  K 3.9 4.8  --  4.3  CL 100  --   --  103  CO2 25  --   --  28  GLUCOSE 157* 186*  --  230*  BUN 14  --   --  16  CREATININE 0.63  --  0.78 0.72  CALCIUM 9.0  --   --  8.1*   ------------------------------------------------------------------------------------------------------------------ No results for input(s): CHOL, HDL, LDLCALC, TRIG, CHOLHDL, LDLDIRECT in the last 72 hours.  Lab Results  Component Value Date   HGBA1C 6.7 (H) 03/22/2017   ------------------------------------------------------------------------------------------------------------------ No results for input(s): TSH, T4TOTAL, T3FREE, THYROIDAB in the last 72 hours.  Invalid input(s): FREET3 ------------------------------------------------------------------------------------------------------------------ No results for input(s): VITAMINB12, FOLATE, FERRITIN, TIBC, IRON, RETICCTPCT in the last 72 hours.  Coagulation profile No results for input(s):  INR, PROTIME in the last 168 hours.  No results for input(s): DDIMER in the last 72 hours.  Cardiac Enzymes No results for input(s): CKMB, TROPONINI, MYOGLOBIN in the last 168 hours.  Invalid input(s): CK ------------------------------------------------------------------------------------------------------------------ No results found for: BNP  Inpatient Medications  Scheduled Meds: . budesonide-formoterol  2 puff Inhalation BID  . guaiFENesin  1,200 mg Oral BID  . oxyCODONE  10 mg Oral Q12H  . pantoprazole  40 mg Oral Daily  . senna-docusate  3 tablet Oral BID   Continuous Infusions: . sodium chloride 50 mL/hr at 07/21/18 0846  . sodium chloride 125 mL/hr at 07/21/18 1800  . lactated ringers 10 mL/hr at 07/21/18 1159   PRN Meds:.acetaminophen, albuterol, alum & mag hydroxide-simeth, diphenhydrAMINE, hydrALAZINE, HYDROmorphone (DILAUDID) injection, magnesium citrate, methocarbamol, metoCLOPramide **OR** metoCLOPramide (REGLAN) injection, morphine injection, ondansetron **OR** ondansetron (ZOFRAN) IV, oxyCODONE, oxyCODONE, polyethylene glycol, sorbitol  Micro Results No results found for this or any previous visit (from the past 240 hour(s)).  Radiology Reports Ct Knee Left Wo Contrast  Result Date: 07/20/2018 CLINICAL DATA:  Patient tripped at work and could not get up. Patient heard a pop and has pain and burning in the knees. EXAM: CT OF THE LEFT KNEE WITHOUT CONTRAST TECHNIQUE: Multidetector CT imaging of the LEFT knee was performed according to the standard protocol. Multiplanar CT image reconstructions were also generated. COMPARISON:  None. FINDINGS: Bones/Joint/Cartilage Small to moderate suprapatellar joint effusion. No acute fracture joint dislocation. No suspicious osseous lesions are identified. Mild enthesopathy off the upper Fowler the patella along the quadriceps. Chondrocalcinosis of hyaline cartilage. Ligaments Suboptimally assessed by CT. Muscles and Tendons The distal  quadriceps muscles and tendon are thickened and somewhat hyperdense in appearance raising suspicion for high-grade intramuscular strain/tear with likely intramuscular hemorrhage accounting for the hyperdense appearance, series 6/59. The patellar tendon appears intact. Soft tissues No soft tissue hematoma or fluid collection. IMPRESSION: 1. Thickened and somewhat hyperdense appearance of the distal quadriceps muscles and tendon raising suspicion a high-grade intramuscular strain/tear with likely intramuscular hemorrhage accounting for the hyperdense appearance. 2. Small to moderate suprapatellar joint effusion. 3. No acute fracture nor joint dislocation. Electronically Signed   By: Ashley Royalty M.D.   On: 07/20/2018 20:59   Ct Knee Right Wo Contrast  Result Date: 07/20/2018 CLINICAL DATA:  [place].  Severe pain. EXAM: CT OF THE right KNEE WITHOUT CONTRAST TECHNIQUE: Multidetector CT imaging of the right knee was performed according to the standard protocol. Multiplanar CT image reconstructions were also generated. COMPARISON:  None. FINDINGS: No acute fracture is identified. There is a calcific density in the posterior joint space posterior to the PCL. The PCL also demonstrates some streaky calcifications. Chondrocalcinosis is noted involving both the medial and lateral menisci. Findings could be due to CPPD arthropathy. The patella is lying low (patella [place]). The patellar tendon is wavy and redundant. I suspect the quadriceps tendon is completely ruptured and there is a large hematoma and  joint effusion in this area. Recommend orthopedic consultation and MRI would be the best test for further evaluation. IMPRESSION: 1. Suspect completely ruptured quadriceps tendon. Associated large hematoma and suprapatellar knee joint effusion. Recommend orthopedic consultation. MRI would be helpful for further evaluation. 2. Chondrocalcinosis suggesting CPPD arthropathy. 3. No acute fractures are demonstrated. Electronically  Signed   By: Marijo Sanes M.D.   On: 07/20/2018 20:59   Dg Knee Complete 4 Views Left  Result Date: 07/20/2018 CLINICAL DATA:  Pt c/o bilateral knee pain after slipping and falling on a wet floor today. Hx of previous arthroscopy knee surgery; pt does not know which laterality was operated on. Best obtainable images due to pt condition. EXAM: LEFT KNEE - COMPLETE 4+ VIEW COMPARISON:  None. FINDINGS: No evidence of fracture, dislocation, or joint effusion. No evidence of arthropathy or other focal bone abnormality. Soft tissues are unremarkable. IMPRESSION: Negative. Electronically Signed   By: Nolon Nations M.D.   On: 07/20/2018 15:45   Dg Knee Complete 4 Views Right  Result Date: 07/20/2018 CLINICAL DATA:  Pt c/o bilateral knee pain after slipping and falling on a wet floor today. Hx of previous arthroscopy knee surgery; pt does not know which laterality was operated on. Best obtainable images due to pt condition. EXAM: RIGHT KNEE - COMPLETE 4+ VIEW COMPARISON:  None. FINDINGS: No acute fracture or subluxation. Degenerative changes are identified at the patellofemoral compartment. Calcific density along the posterior intercondylar notch raises a question of remote ligamentous injury. IMPRESSION: No evidence for acute abnormality. Electronically Signed   By: Nolon Nations M.D.   On: 07/20/2018 15:49      Phillips Climes M.D on 07/23/2018 at 11:48 AM  Between 7am to 7pm - Pager - [phone]  After 7pm go to [email or web address] - password [email or web address]  [facility] -  Office  [phone]

## 2018-07-23 NOTE — Progress Notes (Signed)
Pt wishes to be transferred on bariatric bed sometime today. Patient wanted to get some rest for now. Will endorse accordingly.

## 2018-07-23 NOTE — Evaluation (Signed)
Physical Therapy Evaluation Patient Details Name: Larry Fowler MRN: [id number] DOB: [birth year] Today's Date: 07/23/2018   History of Present Illness  47 y.o.malewithhistory of morbid obesity, asthma, hypertension, sleep apnea had a fall at home while walking to the bathroom. Patient slipped and fell backwards. Following which patient's knee started hurting. CAT scan done which shows bilateral quadricep tendon tear.Status bilateral repair of quadriceps tendons by Dr. Deno Etienne 07/21/2018; Bil WBAT,   Clinical Impression   Patient is s/p above surgery resulting in functional limitations due to the deficits listed below (see PT Problem List). Independent prior to admission; Presents with decr functional mobility, decr activity tolerance, bil knees with precaution of no flexion; +3 assist to get to sit up on EOB; minguard assist for balance once stable in sitting; notable lightheadedness sitting up today; We discussed options for using bed elevation to help with standing trials next session; Larry Fowler is young, with a strong upper body, and motivated to be as independent as he can be; It is well worth considering CIR for post-acute rehab -- does his body habitus help qualify him for CIR? Will place Rehab Screen;  Patient will benefit from skilled PT to increase their independence and safety with mobility to allow discharge to the venue listed below.       Follow Up Recommendations CIR    Equipment Recommendations  Rolling walker with 5" wheels;3in1 (PT)(Bariatric)    Recommendations for Other Services OT consult(ordered per protocol)     Precautions / Restrictions Precautions Precautions: Fall Precaution Comments: NO knee flexion Required Braces or Orthoses: Other Brace Other Brace: Bilateral Bledsoe braces locked in extension Restrictions Weight Bearing Restrictions: Yes RLE Weight Bearing: Weight bearing as tolerated LLE Weight Bearing: Weight bearing as tolerated Other Position/Activity  Restrictions: No knee extension      Mobility  Bed Mobility Overal bed mobility: Needs Assistance Bed Mobility: Rolling;Sit to Supine;Supine to Sit Rolling: Mod assist;+2 for physical assistance(+3 to help with bed pad placement)   Supine to sit: Max assist;+2 for physical assistance(plus 3) Sit to supine: Mod assist;+2 for physical assistance(+3)   General bed mobility comments: Larry Fowler has a strong upper body, but difficulty reaching across to use bedrail cue to body habitus, and some L shoulder pain; one person supports moving leg, second person helps torso over ( and third to manage/straighten bed pads); 3 person assist as well to get to sit on EOB, one person supporting LEs coming to the floor, and two assisting trunk up and supporting at initial sit because he tended to lean back at first; mod assist to lay back down mostly to help kick LEs enough to clear bed frame; uses trapeze bar quite well  Transfers                    Ambulation/Gait                Stairs            Wheelchair Mobility    Modified Rankin (Stroke Patients Only)       Balance Overall balance assessment: Needs assistance   Sitting balance-Leahy Scale: Fair Sitting balance - Comments: Sat EOB at least 10-15 minutes; performed wieght shifts and reciprocal scooting to EOB with mod assist; short, inefficient scooting, but helpful in getting his feet to the floor  Pertinent Vitals/Pain Pain Assessment: Faces Faces Pain Scale: Hurts even more Pain Location: Bil LEs, mostly R knee Pain Descriptors / Indicators: Grimacing;Guarding;Aching;Heaviness Pain Intervention(s): Monitored during session;Premedicated before session;RN gave pain meds during session    Home Living Family/patient expects to be discharged to:: Private residence Living Arrangements: Spouse/significant other Available Help at Discharge: Family;Available  PRN/intermittently Type of Home: House Home Access: Stairs to enter Entrance Stairs-Rails: None Entrance Stairs-Number of Steps: 3(2+1) Home Layout: One level Home Equipment: None      Prior Function Level of Independence: Independent         Comments: He is a Research scientist (medical); 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        Extremity/Trunk Assessment   Upper Extremity Assessment Upper Extremity Assessment: Defer to OT evaluation(notable L shoulder pain with reach for rails)    Lower Extremity Assessment Lower Extremity Assessment: RLE deficits/detail;LLE deficits/detail RLE Deficits / Details: Overall limted by pain and body habitus; Bledsoe brace locked in extension, required repositioning R and L once back supine; Larry Fowler to perform straigth leg raise with light moderate assist LLE Deficits / Details: Overall limted by pain and body habitus; Bledsoe brace locked in extension, required repositioning R and L once back supine; Larry Fowler to perform straigth leg raise with light moderate assist       Communication   Communication: No difficulties  Cognition Arousal/Alertness: Awake/alert Behavior During Therapy: WFL for tasks assessed/performed Overall Cognitive Status: Within Functional Limits for tasks assessed                                        General Comments      Exercises     Assessment/Plan    PT Assessment Patient needs continued PT services  PT Problem List Decreased strength;Decreased range of motion;Decreased activity tolerance;Decreased balance;Decreased mobility;Decreased knowledge of use of DME;Pain;Obesity;Decreased knowledge of precautions       PT Treatment Interventions DME instruction;Gait training;Functional mobility training;Therapeutic activities;Therapeutic exercise;Balance training;Patient/family education;Wheelchair mobility training    PT Goals (Current goals can be found in the Care Plan section)   Acute Rehab PT Goals Patient Stated Goal: back to work/ back to normal PT Goal Formulation: With patient Time For Goal Achievement: 08/06/18 Potential to Achieve Goals: Good    Frequency Min 4X/week   Barriers to discharge        Co-evaluation               AM-PAC PT "6 Clicks" Mobility  Outcome Measure Help needed turning from your back to your side while in a flat bed without using bedrails?: A Lot Help needed moving from lying on your back to sitting on the side of a flat bed without using bedrails?: A Lot Help needed moving to and from a bed to a chair (including a wheelchair)?: A Lot Help needed standing up from a chair using your arms (e.g., wheelchair or bedside chair)?: Total Help needed to walk in hospital room?: Total Help needed climbing 3-5 steps with a railing? : Total 6 Click Score: 9    End of Session Equipment Utilized During Treatment: Other (comment)(bed pad) Activity Tolerance: Patient tolerated treatment well;Other (comment)(thoguh painful) Patient left: in bed;with call bell/phone within reach;Other (comment)(air mattress on) Nurse Communication: Mobility status PT Visit Diagnosis: Other abnormalities of gait and mobility (R26.89);Pain;Difficulty in walking, not elsewhere classified (R26.2) Pain - Right/Left: (  Bilateral) Pain - part of body: Leg;Knee    Time: [id number] PT Time Calculation (min) (ACUTE ONLY): 65 min   Charges:   PT Evaluation $PT Eval Moderate Complexity: 1 Mod PT Treatments $Therapeutic Activity: 38-52 mins        Van ClinesHolly Tawnie Ehresman, PT  Acute Rehabilitation Services Pager [phone] Office [phone]   Levi AlandHolly H Annasofia Pohl 07/23/2018, 1:08 PM

## 2018-07-23 NOTE — Plan of Care (Signed)
  Problem: Pain Managment: Goal: General experience of comfort will improve Outcome: Progressing   Problem: Safety: Goal: Ability to remain free from injury will improve Outcome: Progressing   Problem: Skin Integrity: Goal: Risk for impaired skin integrity will decrease Outcome: Progressing   

## 2018-07-23 NOTE — Progress Notes (Signed)
Rehab Admissions Coordinator Note:  Patient was screened by Clois Dupes for appropriateness for an Inpatient Acute Rehab Consult per PT rec.Marland Kitchen  At this time, we are recommending Inpatient Rehab consult if pt would like to be considered for admit.   Clois Dupes 07/23/2018, 8:23 PM  I can be reached at [phone].

## 2018-07-24 ENCOUNTER — Ambulatory Visit: Payer: Self-pay | Admitting: Registered"

## 2018-07-24 ENCOUNTER — Encounter (HOSPITAL_COMMUNITY): Payer: Self-pay | Admitting: Orthopaedic Surgery

## 2018-07-24 ENCOUNTER — Ambulatory Visit: Payer: Self-pay | Admitting: Skilled Nursing Facility1

## 2018-07-24 DIAGNOSIS — R739 Hyperglycemia, unspecified: Secondary | ICD-10-CM

## 2018-07-24 DIAGNOSIS — S76119A Strain of unspecified quadriceps muscle, fascia and tendon, initial encounter: Secondary | ICD-10-CM

## 2018-07-24 DIAGNOSIS — D62 Acute posthemorrhagic anemia: Secondary | ICD-10-CM

## 2018-07-24 DIAGNOSIS — Z720 Tobacco use: Secondary | ICD-10-CM

## 2018-07-24 DIAGNOSIS — F419 Anxiety disorder, unspecified: Secondary | ICD-10-CM

## 2018-07-24 DIAGNOSIS — F329 Major depressive disorder, single episode, unspecified: Secondary | ICD-10-CM

## 2018-07-24 DIAGNOSIS — G8918 Other acute postprocedural pain: Secondary | ICD-10-CM

## 2018-07-24 DIAGNOSIS — J4530 Mild persistent asthma, uncomplicated: Secondary | ICD-10-CM

## 2018-07-24 DIAGNOSIS — S76112A Strain of left quadriceps muscle, fascia and tendon, initial encounter: Principal | ICD-10-CM

## 2018-07-24 DIAGNOSIS — G4733 Obstructive sleep apnea (adult) (pediatric): Secondary | ICD-10-CM

## 2018-07-24 LAB — TYPE AND SCREEN
ABO/RH(D): O NEG
Antibody Screen: NEGATIVE
UNIT DIVISION: 0

## 2018-07-24 LAB — CBC
HCT: 24.4 % — ABNORMAL LOW (ref 39.0–52.0)
Hemoglobin: 7.8 g/dL — ABNORMAL LOW (ref 13.0–17.0)
MCH: 30.6 pg (ref 26.0–34.0)
MCHC: 32 g/dL (ref 30.0–36.0)
MCV: 95.7 fL (ref 80.0–100.0)
Platelets: 121 10*3/uL — ABNORMAL LOW (ref 150–400)
RBC: 2.55 MIL/uL — ABNORMAL LOW (ref 4.22–5.81)
RDW: 14.9 % (ref 11.5–15.5)
WBC: 8.7 10*3/uL (ref 4.0–10.5)
nRBC: 0 % (ref 0.0–0.2)

## 2018-07-24 LAB — BPAM RBC
Blood Product Expiration Date: 202003012359
ISSUE DATE / TIME: 202002161312
Unit Type and Rh: 9500

## 2018-07-24 LAB — BASIC METABOLIC PANEL
Anion gap: 8 (ref 5–15)
BUN: 10 mg/dL (ref 6–20)
CO2: 26 mmol/L (ref 22–32)
Calcium: 8.2 mg/dL — ABNORMAL LOW (ref 8.9–10.3)
Chloride: 102 mmol/L (ref 98–111)
Creatinine, Ser: 0.64 mg/dL (ref 0.61–1.24)
GFR calc Af Amer: >60 mL/min (ref >60)
GFR calc non Af Amer: >60 mL/min (ref >60)
GLUCOSE: 242 mg/dL — AB (ref 70–99)
Potassium: 3.9 mmol/L (ref 3.5–5.1)
Sodium: 136 mmol/L (ref 135–145)

## 2018-07-24 LAB — GLUCOSE, CAPILLARY
GLUCOSE-CAPILLARY: 283 mg/dL — AB (ref 70–99)
Glucose-Capillary: 228 mg/dL — ABNORMAL HIGH (ref 70–99)
Glucose-Capillary: 206 mg/dL — ABNORMAL HIGH (ref 70–99)
Glucose-Capillary: 185 mg/dL — ABNORMAL HIGH (ref 70–99)
Glucose-Capillary: 168 mg/dL — ABNORMAL HIGH (ref 70–99)

## 2018-07-24 NOTE — Progress Notes (Signed)
PROGRESS NOTE  Larry Fowler  JQD:[id number] DOB: 21-Mar-1971 DOA: 07/20/2018 PCP: [name], MD   Brief Narrative: 48 y.o.malewithhistory of morbid obesity, asthma, hypertension, and sleep apnea who presented with knee pain following a fall at home. CT showed bilateral quadriceps tendon tear.On-call orthopedic surgeon Dr.Xuwas consulted, performed bilateral repair of quadriceps tendons 07/21/2018. Postoperative course complicated by blood loss anemia requiring 1u PRBCs 2/16 and constipation since resolved. PT and OT is ongoing. CIR recommended, though he will need to increase his ability to tolerate 3 hours per day.   Assessment & Plan: Principal Problem:   Quadriceps tendon rupture Active Problems:   Asthma   Morbid obesity (HCC)   OSA (obstructive sleep apnea)   HTN (hypertension)   Hyperglycemia   Rupture of left quadriceps muscle   Postoperative anemia due to acute blood loss   Anxiety and depression   Super-super obese (HCC)   Postoperative pain   Tobacco abuse  Bilateral quadricep tendon tear: s/p rpair 2/14.  - Postoperative recommendations by orthopedics appreciated.  - Awaiting disposition to CIR.   Acute blood loss anemia: Improved with 1u PRBCs 2/16. No bleeding - Monitor in AM  HTN: Currently low-normotensive.  - Hold losartan.  OSA:  - Continue CPAP  Asthma: No exacerbation:  - Trial duonebs  Tobacco use:  - Cessation counseling provided.   Constipation: Resolved 2/17 - Continue bowel regimen with ongoing need for pain control.   Hyperglycemia:  - Repeat HbA1c pending.   DVT prophylaxis: Lovenox Code Status: Full Family Communication: None at bedside Disposition Plan: CIR once accepted.  Consultants:   Orthopedics  PM&R  Procedures:  07/21/18 REPAIR BILATERAL QUADRICEPS Tarry Kos, MD   Antimicrobials:  None   Subjective: Tired working with PT. Pain severe when moving, none when not moving. Overall happy with level of  control. No bleeding reported. Had BM today.  Objective: Vitals:   07/24/18 0242 07/24/18 0245 07/24/18 0425 07/24/18 1437  BP: (!) 103/35 (!) 107/38 (!) 109/51 (!) 113/56  Pulse: 88 89 82 80  Resp: 20     Temp: 99.7 F (37.6 C)  99.1 F (37.3 C) 98.7 F (37.1 C)  TempSrc: Oral  Oral Oral  SpO2: (!) 83%  96% 97%  Weight:      Height:        Intake/Output Summary (Last 24 hours) at 07/24/2018 1715 Last data filed at 07/24/2018 0815 Gross per 24 hour  Intake -  Output 475 ml  Net -475 ml   Filed Weights   07/21/18 1153  Weight: (!) 226.8 kg    Gen: Obese male in no distress Pulm: Non-labored breathing. Clear to auscultation bilaterally.  CV: Regular rate and rhythm. No murmur, rub, or gallop. Unable to determine JVD, No pitting in LE's GI: Abdomen soft, non-tender, non-distended, with normoactive bowel sounds. No organomegaly or masses felt. Ext: Bilateral 180deg knee braces with feet warm, AROM and sensation intact. Skin: No rashes, lesions or ulcers Neuro: Alert and oriented. No focal neurological deficits. Psych: Judgement and insight appear normal. Mood & affect appropriate.   Data Reviewed: I have personally reviewed following labs and imaging studies  CBC: Recent Labs  Lab 07/20/18 2220 07/21/18 1357 07/21/18 2018 07/23/18 0500 07/23/18 2017 07/24/18 0948  WBC 10.8*  --  13.3* 9.6  --  8.7  NEUTROABS 6.3  --   --   --   --   --   HGB 13.1 9.9* 9.8* 7.4* 7.8* 7.8*  HCT 41.6  29.0* 29.6* 23.2* 24.7* 24.4*  MCV 96.1  --  95.5 97.9  --  95.7  PLT 164  --  141* 123*  --  121*   Basic Metabolic Panel: Recent Labs  Lab 07/20/18 2220 07/21/18 1357 07/21/18 2018 07/23/18 0500 07/24/18 0948  NA 137 135  --  135 136  K 3.9 4.8  --  4.3 3.9  CL 100  --   --  103 102  CO2 25  --   --  28 26  GLUCOSE 157* 186*  --  230* 242*  BUN 14  --   --  16 10  CREATININE 0.63  --  0.78 0.72 0.64  CALCIUM 9.0  --   --  8.1* 8.2*   GFR: Estimated Creatinine Clearance:  228.5 mL/min (by C-G formula based on SCr of 0.64 mg/dL). Liver Function Tests: No results for input(s): AST, ALT, ALKPHOS, BILITOT, PROT, ALBUMIN in the last 168 hours. No results for input(s): LIPASE, AMYLASE in the last 168 hours. No results for input(s): AMMONIA in the last 168 hours. Coagulation Profile: No results for input(s): INR, PROTIME in the last 168 hours. Cardiac Enzymes: No results for input(s): CKTOTAL, CKMB, CKMBINDEX, TROPONINI in the last 168 hours. BNP (last 3 results) No results for input(s): PROBNP in the last 8760 hours. HbA1C: No results for input(s): HGBA1C in the last 72 hours. CBG: Recent Labs  Lab 07/23/18 1621 07/23/18 2359 07/24/18 0556 07/24/18 1149 07/24/18 1630  GLUCAP 218* 283* 228* 206* 185*   Lipid Profile: No results for input(s): CHOL, HDL, LDLCALC, TRIG, CHOLHDL, LDLDIRECT in the last 72 hours. Thyroid Function Tests: No results for input(s): TSH, T4TOTAL, FREET4, T3FREE, THYROIDAB in the last 72 hours. Anemia Panel: No results for input(s): VITAMINB12, FOLATE, FERRITIN, TIBC, IRON, RETICCTPCT in the last 72 hours. Urine analysis:    Component Value Date/Time   COLORURINE YELLOW 03/21/2017 1358   APPEARANCEUR CLEAR 03/21/2017 1358   LABSPEC 1.020 03/21/2017 1358   PHURINE 6.5 03/21/2017 1358   GLUCOSEU NEGATIVE 03/21/2017 1358   HGBUR NEGATIVE 03/21/2017 1358   BILIRUBINUR NEGATIVE 03/21/2017 1358   KETONESUR NEGATIVE 03/21/2017 1358   PROTEINUR 30 (A) 03/13/2007 2338   UROBILINOGEN 0.2 03/21/2017 1358   NITRITE NEGATIVE 03/21/2017 1358   LEUKOCYTESUR NEGATIVE 03/21/2017 1358   No results found for this or any previous visit (from the past 240 hour(s)).    Radiology Studies: No results found.  Scheduled Meds: . budesonide-formoterol  2 puff Inhalation BID  . guaiFENesin  1,200 mg Oral BID  . oxyCODONE  10 mg Oral Q12H  . pantoprazole  40 mg Oral Daily  . polyethylene glycol  17 g Oral BID  . senna-docusate  3 tablet Oral  BID   Continuous Infusions: . sodium chloride 50 mL/hr at 07/21/18 0846  . sodium chloride 125 mL/hr at 07/21/18 1800  . lactated ringers 10 mL/hr at 07/21/18 1159     LOS: 4 days   Time spent: 25 minutes.  Tyrone Nine, MD [facility] [email or web address] Password [facility] 07/24/2018, 5:15 PM

## 2018-07-24 NOTE — Evaluation (Signed)
Occupational Therapy Evaluation Patient Details Name: Larry Fowler MRN: [id number] DOB: 06/30/1970 Today's Date: 07/24/2018    History of Present Illness 47 y.o.malewithhistory of morbid obesity, asthma, hypertension, sleep apnea had a fall at home while walking to the bathroom. Patient slipped and fell backwards. Following which patient's knee started hurting. CAT scan done which shows bilateral quadricep tendon tear.Status bilateral repair of quadriceps tendons by Dr. Deno Etiennehu 07/21/2018; Bil WBAT,    Clinical Impression   PTA Pt independent in ADL and mobility. 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 Pt is currently mod A +3 for bed mobility, able to sit EOB today for grooming tasks Bil feet on floor WB through BLE, very motivated. Pt is in Bil Bledsoe braces and is total A for LB ADL at this time. He will require skilled OT in the acute setting as well as CIR level therapy to maximize safety and independence in ADL and functional transfer and return to PLOF/independent. OT will focus on transfers and OOB next session.     Follow Up Recommendations  CIR;Supervision/Assistance - 24 hour    Equipment Recommendations  Other (comment)(defer to next venue - will need BARI equipment)    Recommendations for Other Services       Precautions / Restrictions Precautions Precautions: Fall Precaution Comments: NO knee flexion Required Braces or Orthoses: Other Brace Other Brace: Bilateral Bledsoe braces locked in extension Restrictions Weight Bearing Restrictions: Yes RLE Weight Bearing: Weight bearing as tolerated LLE Weight Bearing: Weight bearing as tolerated Other Position/Activity Restrictions: No knee extension      Mobility Bed Mobility Overal bed mobility: Needs Assistance Bed Mobility: Rolling;Sit to Supine;Supine to Sit Rolling: Min assist   Supine to sit: Max assist;+2 for physical assistance(3rd person helpful) Sit to supine: Mod assist;+2 for physical  assistance(+3)   General bed mobility comments: Noting good prep for rolling with trunk and pelvis scoot to preposition; good use of rails with better reach than last session; min assit to help roll, light mod assist to support the moving LE; 3 person assist to go supine to sit (4th person present and helpful for smooth transition); Better use of rails to elevate trunk to sit, assist to support LEs coming to the floor; 3 person assist back to bed  Transfers Overall transfer level: Needs assistance Equipment used: Rolling walker (2 wheeled) Transfers: Sit to/from Stand Sit to Stand: Mod assist;+2 physical assistance(3rd and 4th persons present as well)         General transfer comment: Initiated sit<>stand transfer training, using bed elevation for rise assist; Shoes donned to help with stability on the floor; assist at feet to ensure no slipping; Larry Fowler initiated forward weight shfit from high bed to tip up into stand to RW, good weight shift, but today to painful to fully weight shift onto feet to stand    Balance Overall balance assessment: Needs assistance   Sitting balance-Leahy Scale: Good Sitting balance - Comments: Sat EOB for 15 to 20 minutes; showing very good weight shifting and reciprocal scooting; able to reach for "bedposts" as well; tolerated multiple bed heights, which required him to weight shift to adjust bil foot positioning      Standing balance-Leahy Scale: (Attempted)                             ADL either performed or assessed with clinical judgement   ADL Overall ADL's : Needs assistance/impaired Eating/Feeding: Set up;Sitting;Bed level  Grooming: Set up;Sitting;Bed level   Upper Body Bathing: Minimal assistance   Lower Body Bathing: Maximal assistance;Bed level   Upper Body Dressing : Minimal assistance;Sitting   Lower Body Dressing: Maximal assistance;Bed level Lower Body Dressing Details (indicate cue type and reason): Pt can and will  assist with lifting legs, moving legs etc Toilet Transfer: Maximal assistance;+2 for physical assistance;+2 for safety/equipment;Requires wide/bariatric Toilet Transfer Details (indicate cue type and reason): bed pan for now Toileting- Clothing Manipulation and Hygiene: Total assistance;Bed level Toileting - Clothing Manipulation Details (indicate cue type and reason): Pt assists with rolling as able             Vision Baseline Vision/History: Wears glasses Wears Glasses: At all times Patient Visual Report: No change from baseline       Perception     Praxis      Pertinent Vitals/Pain Pain Assessment: Faces Faces Pain Scale: Hurts whole lot Pain Location: Bil knees, pain significantly increased with attempt to weight shift onto LEs Pain Descriptors / Indicators: Grimacing;Guarding;Aching;Heaviness Pain Intervention(s): Monitored during session;Repositioned;RN gave pain meds during session     Hand Dominance     Extremity/Trunk Assessment Upper Extremity Assessment Upper Extremity Assessment: Overall WFL for tasks assessed   Lower Extremity Assessment Lower Extremity Assessment: Defer to PT evaluation       Communication Communication Communication: No difficulties   Cognition Arousal/Alertness: Awake/alert Behavior During Therapy: WFL for tasks assessed/performed Overall Cognitive Status: Within Functional Limits for tasks assessed                                     General Comments  Took time to make adjustments to bed width for better air mattress fit; Encouraged moving in the bed as desired    Exercises     Shoulder Instructions      Home Living Family/patient expects to be discharged to:: Private residence Living Arrangements: Spouse/significant other Available Help at Discharge: Family;Available PRN/intermittently Type of Home: House Home Access: Stairs to enter [employer] of Steps: (2 +1) Entrance Stairs-Rails:  None Home Layout: One level     Bathroom Shower/Tub: Chief Strategy Officer: Standard     Home Equipment: None          Prior Functioning/Environment Level of Independence: Independent        Comments: owns his own business, He is a Research scientist (medical); 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 Problem List: Decreased strength;Decreased range of motion;Decreased activity tolerance;Impaired balance (sitting and/or standing);Decreased knowledge of use of DME or AE;Decreased knowledge of precautions;Cardiopulmonary status limiting activity;Obesity;Pain      OT Treatment/Interventions: Self-care/ADL training;Therapeutic exercise;DME and/or AE instruction;Therapeutic activities;Patient/family education;Balance training    OT Goals(Current goals can be found in the care plan section) Acute Rehab OT Goals Patient Stated Goal: back to work/ back to normal OT Goal Formulation: With patient Time For Goal Achievement: 08/07/18 Potential to Achieve Goals: Good ADL Goals Pt Will Perform Grooming: with set-up;sitting Pt Will Perform Lower Body Bathing: with set-up;with adaptive equipment;sitting/lateral leans Pt Will Perform Lower Body Dressing: with mod assist;sit to/from stand Pt Will Transfer to Toilet: with min assist;with +2 assist;bedside commode;stand pivot transfer Pt Will Perform Toileting - Clothing Manipulation and hygiene: with mod assist Additional ADL Goal #1: Pt will perform bed mobility at min A +2 assist prior to engaging in ADL  OT Frequency:  Min 3X/week   Barriers to D/C:            Co-evaluation PT/OT/SLP Co-Evaluation/Treatment: Yes Reason for Co-Treatment: Complexity of the patient's impairments (multi-system involvement);For patient/therapist safety;To address functional/ADL transfers PT goals addressed during session: Mobility/safety with mobility;Balance;Strengthening/ROM OT goals addressed during session: ADL's and  self-care;Strengthening/ROM      AM-PAC OT "6 Clicks" Daily Activity     Outcome Measure Help from another person eating meals?: None Help from another person taking care of personal grooming?: None(sitting on bed level) Help from another person toileting, which includes using toliet, bedpan, or urinal?: A Lot Help from another person bathing (including washing, rinsing, drying)?: A Lot Help from another person to put on and taking off regular upper body clothing?: A Lot Help from another person to put on and taking off regular lower body clothing?: Total 6 Click Score: 15   End of Session Equipment Utilized During Treatment: Other (comment)(bil bledsoe braces) Nurse Communication: Need for lift equipment  Activity Tolerance: Patient tolerated treatment well Patient left: in bed;with call bell/phone within reach;with family/visitor present;with SCD's reapplied  OT Visit Diagnosis: Unsteadiness on feet (R26.81);Other abnormalities of gait and mobility (R26.89);Pain Pain - Right/Left: (Bilateral) Pain - part of body: Leg                Time: 2774-1287 OT Time Calculation (min): 75 min Charges:  OT General Charges $OT Visit: 1 Visit OT Evaluation $OT Eval Moderate Complexity: 1 Mod OT Treatments $Self Care/Home Management : 8-22 mins  Sherryl Manges OTR/L Acute Rehabilitation Services Pager: [phone] Office: [phone]  Larry Fowler Larry Fowler 07/24/2018, 2:50 PM

## 2018-07-24 NOTE — Progress Notes (Signed)
Physical Therapy Treatment Patient Details Name: Larry Fowler MRN: [id number] DOB: 02/27/1971 Today's Date: 07/24/2018    History of Present Illness 48 y.o.malewithhistory of morbid obesity, asthma, hypertension, sleep apnea had a fall at home while walking to the bathroom. Patient slipped and fell backwards. Following which patient's knee started hurting. CAT scan done which shows bilateral quadricep tendon tear.Status bilateral repair of quadriceps tendons by Dr. Deno Etienne 07/21/2018; Bil WBAT,     PT Comments    Continuing work on functional mobility and activity tolerance;  Noting very good improvements with rolling and bed mobility as well as much smoother transition from supine to sit; Tolerated sitting EOB for considerably more time than last session; Initial attempt at sit to stand with bed elevated proved to be too painful today, but [name] participated well and helped with suggestions for moving better; My hope is that having made the attempt today -- sit to stand is no longer unknown to [place], and we will be able to spend more time on transfer training next session   Follow Up Recommendations  CIR     Equipment Recommendations  Rolling walker with 5" wheels;3in1 (PT)(Bariatric)    Recommendations for Other Services       Precautions / Restrictions Precautions Precautions: Fall Precaution Comments: NO knee flexion Required Braces or Orthoses: Other Brace Other Brace: Bilateral Bledsoe braces locked in extension Restrictions RLE Weight Bearing: Weight bearing as tolerated LLE Weight Bearing: Weight bearing as tolerated Other Position/Activity Restrictions: No knee extension    Mobility  Bed Mobility Overal bed mobility: Needs Assistance Bed Mobility: Rolling;Sit to Supine;Supine to Sit Rolling: Min assist   Supine to sit: Max assist;+2 for physical assistance(3rd person helpful) Sit to supine: Mod assist;+2 for physical assistance(+3)   General bed mobility  comments: Noting good prep for rolling with trunk and pelvis scoot to preposition; good use of rails with better reach than last session; min assit to help roll, light mod assist to support the moving LE; 3 person assist to go supine to sit (4th person present and helpful for smooth transition); Better use of rails to elevate trunk to sit, assist to support LEs coming to the floor; 3 person assist back to bed  Transfers Overall transfer level: Needs assistance Equipment used: Rolling walker (2 wheeled) Transfers: Sit to/from Stand Sit to Stand: Mod assist;+2 physical assistance(3rd and 4th persons present as well)         General transfer comment: Initiated sit<>stand transfer training, using bed elevation for rise assist; Shoes donned to help with stability on the floor; assist at feet to ensure no slipping; [name] initiated forward weight shfit from high bed to tip up into stand to RW, good weight shift, but today to painful to fully weight shift onto feet to stand  Ambulation/Gait                 Stairs             Wheelchair Mobility    Modified Rankin (Stroke Patients Only)       Balance Overall balance assessment: Needs assistance   Sitting balance-Leahy Scale: Good Sitting balance - Comments: Sat EOB for 15 to 20 minutes; showing very good weight shifting and reciprocal scooting; able to reach for "bedposts" as well; tolerated multiple bed heights, which required him to weight shift to adjust bil foot positioning      Standing balance-Leahy Scale: (Attempted)  Cognition Arousal/Alertness: Awake/alert Behavior During Therapy: WFL for tasks assessed/performed Overall Cognitive Status: Within Functional Limits for tasks assessed                                        Exercises      General Comments General comments (skin integrity, edema, etc.): Took time to make adjustments to bed width for better  air mattress fit; Encouraged moving in the bed as desired      Pertinent Vitals/Pain Pain Assessment: Faces Faces Pain Scale: Hurts whole lot Pain Location: Bil knees, pain significantly increased with attempt to weight shift onto LEs Pain Descriptors / Indicators: Grimacing;Guarding;Aching;Heaviness Pain Intervention(s): Monitored during session;Premedicated before session;Repositioned    Home Living                      Prior Function            PT Goals (current goals can now be found in the care plan section) Acute Rehab PT Goals Patient Stated Goal: back to work/ back to normal PT Goal Formulation: With patient Time For Goal Achievement: 08/06/18 Potential to Achieve Goals: Good Progress towards PT goals: Progressing toward goals    Frequency    Min 4X/week      PT Plan Current plan remains appropriate    Co-evaluation PT/OT/SLP Co-Evaluation/Treatment: Yes Reason for Co-Treatment: Complexity of the patient's impairments (multi-system involvement);For patient/therapist safety;To address functional/ADL transfers PT goals addressed during session: Mobility/safety with mobility        AM-PAC PT "6 Clicks" Mobility   Outcome Measure  Help needed turning from your back to your side while in a flat bed without using bedrails?: A Little Help needed moving from lying on your back to sitting on the side of a flat bed without using bedrails?: A Lot Help needed moving to and from a bed to a chair (including a wheelchair)?: A Lot Help needed standing up from a chair using your arms (e.g., wheelchair or bedside chair)?: A Lot Help needed to walk in hospital room?: Total Help needed climbing 3-5 steps with a railing? : Total 6 Click Score: 11    End of Session Equipment Utilized During Treatment: Gait belt;Other (comment)(Bil Bledsoes) Activity Tolerance: Patient tolerated treatment well Patient left: in bed;with call bell/phone within reach Nurse  Communication: Mobility status PT Visit Diagnosis: Other abnormalities of gait and mobility (R26.89);Pain;Difficulty in walking, not elsewhere classified (R26.2) Pain - Right/Left: (Bilateral) Pain - part of body: Leg;Knee     Time: 6606-0045 PT Time Calculation (min) (ACUTE ONLY): 67 min  Charges:  $Therapeutic Activity: 23-37 mins                     Van Clines, PT  Acute Rehabilitation Services Pager [phone] Office [phone]    Levi Aland 07/24/2018, 1:04 PM

## 2018-07-24 NOTE — Consult Note (Addendum)
Physical Medicine and Rehabilitation Consult  Reason for Consult: Functional deficits due to bilateral quad tendon rupture.  Referring Physician: Dr. Jarvis Newcomer   HPI: Larry Fowler is a 48 y.o. male with history of OSA- CPAP,  anxiety/depression, asthma, hyperglycemia, super morbid obesity with BMI 64 who was admitted on 07/20/2018 after a slipping and falling backwards with acute onset of bilateral knee pain.  History taken from chart review and patient.  CT of knees done revealing bilateral quad tendon rupture and he underwent repair on 2/14 by Dr. Roda Shutters. Post op to be WBAT with bilateral KI as well as lovenox for DVT prophylaxis. He has had issues with pain control and OxyContin as well as IV dilaudid added to manage symptoms. He has had progressive drop in H/H from 13.1--> 7.4 and was confused with 1 unit PRBC yesterday. Therapy evaluations done and patient limited by body habitus, lightheaded with movement as well as Bledsoe braces with knees locked in extension. CIR recommended due to functional deficits.    Review of Systems  Constitutional: Negative for chills and fever.  HENT: Negative for hearing loss and tinnitus.   Eyes: Negative for blurred vision and double vision.  Respiratory: Positive for shortness of breath (has to use rescue inhaler twice a day. ).   Cardiovascular: Negative for chest pain and palpitations.  Gastrointestinal: Negative for heartburn and nausea.  Genitourinary: Negative for dysuria and urgency.  Musculoskeletal: Positive for joint pain and myalgias.  Neurological: Positive for focal weakness. Negative for dizziness and headaches.  Psychiatric/Behavioral: The patient does not have insomnia.   All other systems reviewed and are negative.     Past Medical History:  Diagnosis Date  . ALLERGIC RHINITIS 09/12/2007   Qualifier: Diagnosis of  By: Jonny Ruiz MD, Len Blalock   . ANXIETY 02/16/2007   Qualifier: Diagnosis of  By: Tyrone Apple, Lucy    . ASTHMA 12/17/2009   Qualifier: Diagnosis of  By: Jonny Ruiz MD, Len Blalock   . Cannabis abuse    currently  . Chills with fever   . DEPRESSION 09/12/2007   Qualifier: Diagnosis of  By: Jonny Ruiz MD, Len Blalock   . History of cocaine use    in his 107's  . Hyperglycemia   . Rectal fistula 2009  . Rectal pain   . Renal stone    2008    Past Surgical History:  Procedure Laterality Date  . FINGER SURGERY  2008/09?   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 knee surgury     ? which knee per Dr Thurston Hole in his teens  . TONSILLECTOMY      Family History  Problem Relation Age of Onset  . Diabetes type II Mother   . Pneumonia Father     Social History:  Married. Wife works days at [employer]. 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 He reports that he has been smoking--1 PPD every 2 days. He has never used smokeless tobacco. He reports current alcohol use. He reports current drug use. Drug: Marijuana- "whenever I want"    Allergies: No Known Allergies    Medications Prior to Admission  Medication Sig Dispense Refill  . albuterol (VENTOLIN HFA) 108 (90 Base) MCG/ACT inhaler Inhale 2 puffs into the lungs every 6 (six) hours as needed for wheezing. 1 Inhaler 11  . budesonide-formoterol (SYMBICORT) 80-4.5 MCG/ACT inhaler Inhale 2 puffs into the lungs 2 (two) times daily. 1 Inhaler 6  .  Diclofenac Sodium 2 % SOLN Place 2 g onto the skin 2 (two) times daily. 112 g 3  . losartan (COZAAR) 25 MG tablet Take 1 tablet (25 mg total) by mouth 2 (two) times daily. 180 tablet 3  . naproxen (NAPROSYN) 500 MG tablet TAKE 1 TABLET BY MOUTH TWICE DAILY WITH A MEAL (Patient taking differently: Take 500 mg by mouth 2 (two) times daily with a meal. 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 Status:  Mobility: Bed Mobility Overal bed mobility: Needs Assistance Bed Mobility: Rolling, Sit to Supine, Supine to Sit Rolling: Mod assist, +2 for physical assistance(+3 to help with bed pad placement) Supine to sit: Max assist, +2 for physical assistance(plus 3) Sit to supine: Mod assist, +2 for physical assistance(+3) General bed mobility comments: [name] has a strong upper body, but difficulty reaching across to use bedrail cue to body habitus, and some L shoulder pain; one person supports moving leg, second person helps torso over ( and third to manage/straighten bed pads); 3 person assist as well to get to sit on EOB, one person supporting LEs coming to the floor, and two assisting trunk up and supporting at initial sit because he tended to lean back at first; mod assist to lay back down mostly to help kick LEs enough to clear bed frame; uses trapeze bar quite well        ADL:    Cognition: Cognition Overall Cognitive Status: Within Functional Limits for tasks assessed Orientation Level: Oriented X4 Cognition Arousal/Alertness: Awake/alert Behavior During Therapy: WFL for tasks assessed/performed Overall Cognitive Status: Within Functional Limits for tasks assessed   Blood pressure (!) 109/51, pulse 82, temperature 99.1 F (37.3 C), temperature source Oral, resp. rate 20, height 6\' 2"  (1.88 m), weight (!) 226.8 kg, SpO2 96 %. Physical Exam  Nursing note and vitals reviewed. Constitutional: He is oriented to person, place, and time. He appears well-developed. No distress.  Morbidly obese male. NAD.   HENT:  Head: Normocephalic and atraumatic.  Eyes: EOM are  normal. Right eye exhibits no discharge. Left eye exhibits no discharge.  Neck: Normal range of motion. Neck supple.  Cardiovascular: Normal rate and regular rhythm.  Respiratory: Effort normal and breath sounds normal.  GI: Soft. Bowel sounds are normal.  Musculoskeletal:     Comments: BLE with ACE wraps and Bledsoe braces.   Neurological: He is alert and oriented to person, place, and time.  Motor: Bilateral upper extremities: 5/5 proximal distal Bilateral lower extremities: Hip flexion 2/5, knees in brace, ankle dorsiflexion 4/5 Sensation intact light touch  Skin: He is not diaphoretic.  See above  Psychiatric: He has a normal mood and affect. His behavior is normal.    Results for orders placed or performed during the hospital encounter of 07/20/18 (from the past 24 hour(s))  Prepare RBC     Status: None   Collection Time: 07/23/18 11:50 AM  Result Value Ref Range   Order Confirmation      ORDER PROCESSED BY BLOOD BANK Performed at [facility], [address]  [place], [place] [address]   Glucose, capillary     Status: Abnormal   Collection Time: 07/23/18  1:52 PM  Result Value Ref Range   Glucose-Capillary 223 (H) 70 - 99 mg/dL  Glucose, capillary     Status: Abnormal   Collection Time: 07/23/18  4:21 PM  Result Value Ref Range   Glucose-Capillary 218 (H) 70 - 99 mg/dL  Hemoglobin and hematocrit, blood     Status: Abnormal   Collection Time: 07/23/18  8:17 PM  Result Value Ref Range   Hemoglobin 7.8 (L) 13.0 - 17.0 g/dL   HCT 60.424.7 (L) 54.039.0 - 98.152.0 %  Glucose, capillary     Status: Abnormal   Collection Time: 07/23/18 11:59 PM  Result Value Ref Range   Glucose-Capillary 283 (H) 70 - 99 mg/dL   No results found.  Assessment/Plan: Diagnosis: Bilateral quad tendon rupture Labs independently reviewed.  Records reviewed and summated above.  1. Does the need for close, 24 hr/day medical supervision in concert with the patient's rehab needs make it unreasonable for  this patient to be served in a less intensive setting? Yes  2. Co-Morbidities requiring supervision/potential complications: OSA (continue CPAP, monitor for daytime somnolence),  Anxiety/depression (ensure anxiety and resulting apprehension do not limit functional progress; consider prn medications if warranted), asthma (monitor respiratory rate and O2 sats with increased physical activity), hyperglycemia (Monitor in accordance with exercise and adjust meds as necessary), super morbid obesity (encourage weight loss), postop pain (Biofeedback training with therapies to help reduce reliance on opiate pain medications, particularly IV Dilaudid, monitor pain control during therapies, and sedation at rest and titrate to maximum efficacy to ensure participation and gains in therapies), ABLA (repeat labs, transfuse again to ensure appropriate perfusion for increased activity tolerance if necessary), tobacco abuse (counsel) 3. Due to safety, skin/wound care, disease management, pain management and patient education, does the patient require 24 hr/day rehab nursing? Yes 4. Does the patient require coordinated care of a physician, rehab nurse, PT (1-2 hrs/day, 5 days/week) and OT (1-2 hrs/day, 5 days/week) to address physical and functional deficits in the context of the above medical diagnosis(es)? Yes Addressing deficits in the following areas: balance, endurance, locomotion, strength, transferring, bathing, dressing, toileting and psychosocial support 5. Can the patient actively participate in an intensive therapy program of at least 3 hrs of therapy per day at least 5 days per week? Potentially 6. The potential for patient to make measurable gains while on inpatient rehab is excellent 7. Anticipated functional outcomes upon discharge from inpatient rehab are supervision and min assist  with PT, supervision and min assist with OT, n/a with SLP. 8. Estimated rehab length of stay to reach the above functional goals  is: 16-20 days. 9. Anticipated D/C setting: Home 10. Anticipated post D/C treatments: HH therapy and Home excercise program 11. Overall Rehab/Functional Prognosis: excellent  RECOMMENDATIONS: This patient's condition is appropriate for continued rehabilitative care in the following setting: CR 1 pain better controlled and able to tolerate 3 hours of therapy per day. Patient has agreed to participate in recommended program. Yes Note that insurance prior authorization may be required for reimbursement for recommended care.  Comment: Rehab Admissions Coordinator to follow up.   I have personally performed a face to face diagnostic evaluation, including, but not limited to relevant history and physical exam findings, of this patient and developed relevant assessment and plan.  Additionally, I have reviewed and concur with the physician assistant's documentation above.   Maryla MorrowAnkit Akul Leggette, MD, ABPMR  Jacquelynn Cree, PA-C 07/24/2018

## 2018-07-24 NOTE — Progress Notes (Signed)
Subjective: 3 Days Post-Op Procedure(s) (LRB): REPAIR BILATERAL QUADRICEPS (Bilateral) Patient reports pain as moderate.  Feeling OK other than pain.    Objective: Vital signs in last 24 hours: Temp:  [98.7 F (37.1 C)-99.8 F (37.7 C)] 99.1 F (37.3 C) (02/17 0425) Pulse Rate:  [80-92] 82 (02/17 0425) Resp:  [17-20] 20 (02/17 0242) BP: (103-118)/(35-55) 109/51 (02/17 0425) SpO2:  [83 %-100 %] 96 % (02/17 0425)  Intake/Output from previous day: 02/16 0701 - 02/17 0700 In: 2850.3 [P.O.:1440; I.V.:1041.3; Blood:369] Out: 575 [Urine:575] Intake/Output this shift: Total I/O In: -  Out: 100 [Urine:100]  Recent Labs    07/21/18 1357 07/21/18 2018 07/23/18 0500 07/23/18 2017 07/24/18 0948  HGB 9.9* 9.8* 7.4* 7.8* 7.8*   Recent Labs    07/23/18 0500 07/23/18 2017 07/24/18 0948  WBC 9.6  --  8.7  RBC 2.37*  --  2.55*  HCT 23.2* 24.7* 24.4*  PLT 123*  --  121*   Recent Labs    07/23/18 0500 07/24/18 0948  NA 135 136  K 4.3 3.9  CL 103 102  CO2 28 26  BUN 16 10  CREATININE 0.72 0.64  GLUCOSE 230* 242*  CALCIUM 8.1* 8.2*   No results for input(s): LABPT, INR in the last 72 hours.  Neurologically intact Neurovascular intact Sensation intact distally Intact pulses distally Dorsiflexion/Plantar flexion intact Incision: dressing C/D/I No cellulitis present Compartment soft  Decent fitting Bilateral hinged knee brace locked at zero    Assessment/Plan: 3 Days Post-Op Procedure(s) (LRB): REPAIR BILATERAL QUADRICEPS (Bilateral) Up with therapy  WBAT BLE- hinged knee brace in place at all times NO flexion of the knees F/u with Dr. Roda Shutters 2 weeks post-op D/c dispo per medicine- sounds like CIR at this point      Cristie Hem 07/24/2018, 1:10 PM

## 2018-07-24 NOTE — Progress Notes (Signed)
Inpatient Rehabilitation Admissions Coordinator  I me with patient with therapy at bedside. We discussed briefly the goal of an inpt rehab admit. I await further progress with therapy before beginning authorization with insurance for an inpt rehab admit. Pt very motivated and participatory with therapy session.  Ottie Glazier, RN, MSN Rehab Admissions Coordinator [phone] 07/24/2018 11:36 AM

## 2018-07-25 DIAGNOSIS — E119 Type 2 diabetes mellitus without complications: Secondary | ICD-10-CM

## 2018-07-25 LAB — CBC
HCT: 23.6 % — ABNORMAL LOW (ref 39.0–52.0)
HEMOGLOBIN: 7.7 g/dL — AB (ref 13.0–17.0)
MCH: 31.3 pg (ref 26.0–34.0)
MCHC: 32.6 g/dL (ref 30.0–36.0)
MCV: 95.9 fL (ref 80.0–100.0)
Platelets: 143 10*3/uL — ABNORMAL LOW (ref 150–400)
RBC: 2.46 MIL/uL — ABNORMAL LOW (ref 4.22–5.81)
RDW: 15.1 % (ref 11.5–15.5)
WBC: 8.2 10*3/uL (ref 4.0–10.5)
nRBC: 0 % (ref 0.0–0.2)

## 2018-07-25 LAB — HEMOGLOBIN A1C
Hgb A1c MFr Bld: 7.1 % — ABNORMAL HIGH (ref 4.8–5.6)
Mean Plasma Glucose: 157 mg/dL

## 2018-07-25 LAB — GLUCOSE, CAPILLARY
GLUCOSE-CAPILLARY: 197 mg/dL — AB (ref 70–99)
Glucose-Capillary: 236 mg/dL — ABNORMAL HIGH (ref 70–99)
Glucose-Capillary: 187 mg/dL — ABNORMAL HIGH (ref 70–99)
Glucose-Capillary: 211 mg/dL — ABNORMAL HIGH (ref 70–99)

## 2018-07-25 MED ORDER — LOSARTAN POTASSIUM 25 MG PO TABS
25.0000 mg | ORAL_TABLET | Freq: Every day | ORAL | Status: DC
  Administered 2018-07-25 – 2018-08-01 (×7): 25 mg via ORAL
  Filled 2018-07-25 (×8): qty 1

## 2018-07-25 MED ORDER — INSULIN ASPART 100 UNIT/ML ~~LOC~~ SOLN
0.0000 [IU] | Freq: Three times a day (TID) | SUBCUTANEOUS | Status: DC
  Administered 2018-07-25: 2 [IU] via SUBCUTANEOUS
  Administered 2018-07-25: 3 [IU] via SUBCUTANEOUS
  Administered 2018-07-26 (×3): 2 [IU] via SUBCUTANEOUS
  Administered 2018-07-27: 1 [IU] via SUBCUTANEOUS
  Administered 2018-07-27 – 2018-07-28 (×4): 2 [IU] via SUBCUTANEOUS
  Administered 2018-07-28 – 2018-07-29 (×3): 1 [IU] via SUBCUTANEOUS
  Administered 2018-07-29 – 2018-07-30 (×3): 2 [IU] via SUBCUTANEOUS
  Administered 2018-07-30 – 2018-08-01 (×6): 1 [IU] via SUBCUTANEOUS

## 2018-07-25 MED ORDER — ENOXAPARIN SODIUM 120 MG/0.8ML ~~LOC~~ SOLN
110.0000 mg | SUBCUTANEOUS | Status: DC
  Administered 2018-07-25 – 2018-07-31 (×7): 110 mg via SUBCUTANEOUS
  Filled 2018-07-25 (×8): qty 0.73

## 2018-07-25 MED ORDER — LIVING WELL WITH DIABETES BOOK
Freq: Once | Status: AC
  Administered 2018-07-25: 18:00:00
  Filled 2018-07-25: qty 1

## 2018-07-25 MED ORDER — METFORMIN HCL 500 MG PO TABS
500.0000 mg | ORAL_TABLET | Freq: Every day | ORAL | Status: DC
  Administered 2018-07-26 – 2018-08-01 (×7): 500 mg via ORAL
  Filled 2018-07-25 (×7): qty 1

## 2018-07-25 MED ORDER — INSULIN ASPART 100 UNIT/ML ~~LOC~~ SOLN
0.0000 [IU] | Freq: Every day | SUBCUTANEOUS | Status: DC
  Administered 2018-07-25: 2 [IU] via SUBCUTANEOUS

## 2018-07-25 NOTE — Progress Notes (Signed)
PROGRESS NOTE  Larry Fowler  JOA:[id number] DOB: [birth year] DOA: 07/20/2018 PCP: [name], MD   Brief Narrative: 48 y.o.malewithhistory of morbid obesity, asthma, hypertension, and sleep apnea who presented with knee pain following a fall at home. CT showed bilateral quadriceps tendon tear.On-call orthopedic surgeon Dr.Xuwas consulted, performed bilateral repair of quadriceps tendons 07/21/2018. Postoperative course complicated by blood loss anemia requiring 1u PRBCs 2/16 and constipation since resolved. PT and OT is ongoing. CIR recommended, though he will need to increase his ability to tolerate 3 hours per day.   Assessment & Plan: Principal Problem:   Quadriceps tendon rupture Active Problems:   Asthma   Morbid obesity (HCC)   OSA (obstructive sleep apnea)   HTN (hypertension)   Hyperglycemia   Rupture of left quadriceps muscle   Postoperative anemia due to acute blood loss   Anxiety and depression   Super-super obese (HCC)   Postoperative pain   Tobacco abuse  Bilateral quadricep tendon tear: s/p rpair 2/14.  - Postoperative recommendations by orthopedics appreciated. On oxycontin and oxyIR prn. Bowel regimen also ordered. - Continue daily PT, will attempt to obtain a bed per PT that would facilitate weight bearing/standing. Awaiting disposition to CIR.   T2DM: Hyperglycemia and HbA1c diagnostic at 7.1%.  - Discussed in detail with the patient today. Will start sensitive SSI with insulin naive patient, though may have some resistance.  - Start metformin at low dose and monitor for tolerance. - Dietitian and diabetes coordinator consulted for ongoing education as he is motivated to enact lifestyle changes. - Check lipid panel, though will need at least moderate-intensity statin. May need to defer this while getting aggressive PT with risk of myalgia/myopathy.   Acute blood loss anemia: Improved with 1u PRBCs 2/16. No bleeding and hgb stabilized 7.7 - 7.8.  -  Monitor intermittently.  HTN: Currently low-normotensive.  - Will restart low dose losartan.   OSA:  - Continue CPAP  Asthma: No exacerbation:  - Trial duonebs  Tobacco use:  - Cessation counseling provided.   Constipation: Resolved 2/17 - Continue bowel regimen with ongoing need for pain control.   DVT prophylaxis: Lovenox Code Status: Full Family Communication: None at bedside. Wife at bedside yesterday afternoon on PM rounds. Disposition Plan: CIR once accepted, will require that he is able to stand.  Consultants:   Orthopedics  PM&R  Procedures:  07/21/18 REPAIR BILATERAL QUADRICEPS Tarry Kos, MD   Antimicrobials:  None   Subjective: Worked extensively with PT again today. Unable to stand but transferred to chair. No BM yet today, requests stool softener. Very motivated to progress with PT.  Objective: Vitals:   07/24/18 0425 07/24/18 1437 07/24/18 1959 07/25/18 0450  BP: (!) 109/51 (!) 113/56 (!) 127/55 138/66  Pulse: 82 80 79 80  Resp:   16 16  Temp: 99.1 F (37.3 C) 98.7 F (37.1 C) 99.1 F (37.3 C) 98.9 F (37.2 C)  TempSrc: Oral Oral Oral Oral  SpO2: 96% 97% 99% 100%  Weight:      Height:        Intake/Output Summary (Last 24 hours) at 07/25/2018 1431 Last data filed at 07/25/2018 1100 Gross per 24 hour  Intake 720 ml  Output 600 ml  Net 120 ml   Filed Weights   07/21/18 1153  Weight: (!) 226.8 kg   Gen: Morbidly obese male in no distress Pulm: Nonlabored breathing room air. Clear, distant. CV: Regular rate and rhythm. No murmur, rub, or gallop. No definite  JVD, no pitting dependent edema. GI: Abdomen soft, non-tender, non-distended, with normoactive bowel sounds.  Ext: Warm, no deformities, knees braced in extension, elevated Skin: No new rashes, lesions or ulcers on visualized skin. Some acanthosis noted. Neuro: Alert and oriented. No focal neurological deficits. Psych: Judgement and insight appear fair. Mood euthymic & affect  congruent. Behavior is appropriate.    Data Reviewed: I have personally reviewed following labs and imaging studies  CBC: Recent Labs  Lab 07/20/18 2220  07/21/18 2018 07/23/18 0500 07/23/18 2017 07/24/18 0948 07/25/18 0819  WBC 10.8*  --  13.3* 9.6  --  8.7 8.2  NEUTROABS 6.3  --   --   --   --   --   --   HGB 13.1   < > 9.8* 7.4* 7.8* 7.8* 7.7*  HCT 41.6   < > 29.6* 23.2* 24.7* 24.4* 23.6*  MCV 96.1  --  95.5 97.9  --  95.7 95.9  PLT 164  --  141* 123*  --  121* 143*   < > = values in this interval not displayed.   Basic Metabolic Panel: Recent Labs  Lab 07/20/18 2220 07/21/18 1357 07/21/18 2018 07/23/18 0500 07/24/18 0948  NA 137 135  --  135 136  K 3.9 4.8  --  4.3 3.9  CL 100  --   --  103 102  CO2 25  --   --  28 26  GLUCOSE 157* 186*  --  230* 242*  BUN 14  --   --  16 10  CREATININE 0.63  --  0.78 0.72 0.64  CALCIUM 9.0  --   --  8.1* 8.2*   GFR: Estimated Creatinine Clearance: 228.5 mL/min (by C-G formula based on SCr of 0.64 mg/dL). Liver Function Tests: No results for input(s): AST, ALT, ALKPHOS, BILITOT, PROT, ALBUMIN in the last 168 hours. No results for input(s): LIPASE, AMYLASE in the last 168 hours. No results for input(s): AMMONIA in the last 168 hours. Coagulation Profile: No results for input(s): INR, PROTIME in the last 168 hours. Cardiac Enzymes: No results for input(s): CKTOTAL, CKMB, CKMBINDEX, TROPONINI in the last 168 hours. BNP (last 3 results) No results for input(s): PROBNP in the last 8760 hours. HbA1C: Recent Labs    07/24/18 0948  HGBA1C 7.1*   CBG: Recent Labs  Lab 07/24/18 1149 07/24/18 1630 07/24/18 2219 07/25/18 0553 07/25/18 1120  GLUCAP 206* 185* 168* 197* 236*    Urine analysis:    Component Value Date/Time   COLORURINE YELLOW 03/21/2017 1358   APPEARANCEUR CLEAR 03/21/2017 1358   LABSPEC 1.020 03/21/2017 1358   PHURINE 6.5 03/21/2017 1358   GLUCOSEU NEGATIVE 03/21/2017 1358   HGBUR NEGATIVE 03/21/2017  1358   BILIRUBINUR NEGATIVE 03/21/2017 1358   KETONESUR NEGATIVE 03/21/2017 1358   PROTEINUR 30 (A) 03/13/2007 2338   UROBILINOGEN 0.2 03/21/2017 1358   NITRITE NEGATIVE 03/21/2017 1358   LEUKOCYTESUR NEGATIVE 03/21/2017 1358   No results found for this or any previous visit (from the past 240 hour(s)).    Radiology Studies: No results found.  Scheduled Meds: . budesonide-formoterol  2 puff Inhalation BID  . guaiFENesin  1,200 mg Oral BID  . insulin aspart  0-5 Units Subcutaneous QHS  . insulin aspart  0-9 Units Subcutaneous TID WC  . oxyCODONE  10 mg Oral Q12H  . pantoprazole  40 mg Oral Daily  . senna-docusate  3 tablet Oral BID   Continuous Infusions: . lactated ringers 10 mL/hr at  07/21/18 1159     LOS: 5 days   Time spent: 35 minutes.  Tyrone Nineyan B Jennilyn Esteve, MD [facility] [email or web address] Password [email or web address] 07/25/2018, 2:31 PM

## 2018-07-25 NOTE — Progress Notes (Signed)
Physical Therapy Treatment Patient Details Name: Larry Fowler MRN: [id number] DOB: 15-Dec-1970 Today's Date: 07/25/2018    History of Present Illness 48 y.o.malewithhistory of morbid obesity, asthma, hypertension, sleep apnea had a fall at home while walking to the bathroom. Patient slipped and fell backwards. Following which patient's knee started hurting. CAT scan done which shows bilateral quadricep tendon tear.Status bilateral repair of quadriceps tendons by Larry Fowler; Bil WBAT,     PT Comments    Continuing work on functional mobility and activity tolerance;  Worked on bed mobility and Anterior Posterior transfer to give Larry Fowler more repertoire of movement, and options for OOB to chair transfers; It is noteworthy that he was able to move his legs in the bed better (abduction and adduction with entire leg supported) and min assist for straight leg raises; Larry Fowler's body habitus and nervousness with anticipation of pain contributed to difficulty with the transfer; Still, there was a turning point during the session, when Larry Fowler made an excellent effort with backwards reciprocal scooting into the chair;   Larry Fowler is very willing to try new ideas for moving and problem-solves naturally;   We discussed options for more work on sit to stand; I'm recommending the Vital Go Total Lift bed for safe, stable initial standing (and he does not like his current bed); Contacted Larry Fowler for the order (thanks!), and the Bariatric Vital Go bed has been ordered by [facility] (it may need to be delivered from another facility in [place])  Follow Up Recommendations  CIR     Equipment Recommendations  Rolling walker with 5" wheels;3in1 (PT)(Bariatric)    Recommendations for Other Services       Precautions / Restrictions Precautions Precautions: Fall Precaution Comments: NO knee flexion Required Braces or Orthoses: Other Brace Other Brace: Bilateral Bledsoe braces locked  in extension Restrictions RLE Weight Bearing: Weight bearing as tolerated LLE Weight Bearing: Weight bearing as tolerated Other Position/Activity Restrictions: No knee extension    Mobility  Bed Mobility Overal bed mobility: Needs Assistance Bed Mobility: Supine to Sit     Supine to sit: +2 for physical assistance;Mod assist     General bed mobility comments: Lots of bed mobility today and problem-solving in prep for anterior-posterior transfer to bari recliner; overall good scooting in bed, min assist as needed to move bil LEs (supported on bed); Larry Fowler was able to use the trapeze well to pull his upper body to a better angle for transfer; it was more challenging to pull up and maintain long sit, and Larry Fowler took a few breaks, laying back to recliner with upper body on recliner seat and LEs in the bed at an almost perpendicular angle to the bed; took time to problem solve, used gait belts attached to back of recliner and to the trapeze for multiple hand hold options  Transfers Overall transfer level: Needs assistance Equipment used: (multiple gait belts as straps to pull on) Transfers: Licensed conveyancer transfers: Mod assist;+2 physical assistance;Min assist(and 2 others supporting LEs)   General transfer comment: Initiated Ant-post transfer from the position with [employer] with his upper body in the seat of the recliner and LEs on the bed; Heavy mod assist and Larry Fowler pulling up on strap attached to overhead frame to come to approximate long sit;   4 person assist and coordinated effort to reciprocally scoot backwards to get buttocks into chair; moved approx 6-8 inches into chair, then took a small break;  At the next bout, Larry Fowler made an EXCELLENT effort, was able to get his arms to the armrests of recliner, and scooted the rest of the way with min assist (min assist to essentially keep the linens from bunching under him; He was pleased to be out of  the bed; bil LEs still supported on the bed because the footrests of the recliner were a poor fit  Ambulation/Gait                 Stairs             Wheelchair Mobility    Modified Rankin (Stroke Patients Only)       Balance       Sitting balance - Comments: Lots of trunk work in sitting by virtue of the anterior posterior transfer                                    Cognition Arousal/Alertness: Awake/alert Behavior During Therapy: WFL for tasks assessed/performed Overall Cognitive Status: Within Functional Limits for tasks assessed                                        Exercises      General Comments        Pertinent Vitals/Pain Pain Assessment: Faces Faces Pain Scale: Hurts little more Pain Location: Bil knees, spasm pain Pain Descriptors / Indicators: Spasm Pain Intervention(s): Monitored during session    Home Living                      Prior Function            PT Goals (current goals can now be found in the care plan section) Acute Rehab PT Goals Patient Stated Goal: back to work/ back to normal PT Goal Formulation: With patient Time For Goal Achievement: 08/06/18 Potential to Achieve Goals: Good Progress towards PT goals: Progressing toward goals(working on multiple modes of transfers)    Frequency    Min 4X/week      PT Plan Current plan remains appropriate    Co-evaluation              AM-PAC PT "6 Clicks" Mobility   Outcome Measure  Help needed turning from your back to your side while in a flat bed without using bedrails?: A Little Help needed moving from lying on your back to sitting on the side of a flat bed without using bedrails?: A Lot Help needed moving to and from a bed to a chair (including a wheelchair)?: A Lot Help needed standing up from a chair using your arms (e.g., wheelchair or bedside chair)?: A Lot Help needed to walk in hospital room?: Total Help needed  climbing 3-5 steps with a railing? : Total 6 Click Score: 11    End of Session Equipment Utilized During Treatment: Other (comment)(bed pad) Activity Tolerance: Patient tolerated treatment well Patient left: in chair;with call bell/phone within reach(bil LEs resting on the bed) Nurse Communication: Mobility status;Other (comment)(Notified of order for Total Lift bed) PT Visit Diagnosis: Other abnormalities of gait and mobility (R26.89);Pain;Difficulty in walking, not elsewhere classified (R26.2) Pain - Right/Left: (Bilateral) Pain - part of body: Leg;Knee     Time: 1010-1110 PT Time Calculation (min) (ACUTE ONLY): 60 min  Charges:  $Therapeutic Activity: 53-67 mins  Van ClinesHolly Ryane Canavan, [place] CarolinaPT  Acute Rehabilitation Services Pager [phone] Office [phone]    Levi AlandHolly H Kahlil Cowans 07/25/2018, 2:26 PM

## 2018-07-25 NOTE — Plan of Care (Addendum)
RD consulted for nutrition education regarding new onset T2DM.  RD provided "Carbohydrate Counting for People with Diabetes" and "Diabetes Label Reading Tips"  handout from the Academy of Nutrition and Dietetics. Reviewed patient's dietary recall.  Pt reports a normal intake of 2 meals a day. He will often have 2 cups of coffee with cream before work. Lunch may be a peanut butter and honey sandwich on whole wheat bread or leftovers from dinner. Dinner is either steak, potatoes, green beans, or chicken with vegetables. He mentioned that he has never watched what he ate and always ate what was given to him. He expressed that he was concerned about portion sizes and foods he can/can not have. Took patient's food preferences; will enter into [facility].  Encouraged a consistent, carbohydrate intake with all meals and snacks. Educated about whole grain sources of carbohydrates to maximize fiber intake. Provided examples of starchy vs non-starchy vegetables. Encouraged patient to limit sugar sweetened beverages and to utilize water as the main source of hydration. Reinforced patient to continue to have whole fruits and limit juice consumption.   Explained to patient how to read a food label. Listed resources for patient to refer to for future use regarding portion sizes and appropriate food choices.  RD discussed why it is important for patient to adhere to diet recommendations, and emphasized the role of fiber, foods to incorporate, and the importance of consistent, carbohydrate intake. Teach back method used.  Expect fair compliance.  Body mass index is 64.2 kg/m. Pt meets criteria for Obesity III based on current BMI.  HGB A1C: 7.1  Current diet order is Carb modified, patient is consuming approximately 100% of meals at this time. Labs and medications reviewed.  No further nutrition interventions warranted at this time. RD contact information provided. If additional nutrition issues arise, please  re-consult RD.   [facility] Dietetic Intern

## 2018-07-25 NOTE — Progress Notes (Addendum)
Inpatient Diabetes Program Recommendations  AACE/ADA: New Consensus Statement on Inpatient Glycemic Control (2015)  Target Ranges:  Prepandial:   less than 140 mg/dL      Peak postprandial:   less than 180 mg/dL (1-2 hours)      Critically ill patients:  140 - 180 mg/dL   Lab Results  Component Value Date   GLUCAP 187 (H) 07/25/2018   HGBA1C 7.1 (H) 07/24/2018    Review of Glycemic Control  Diabetes history: New diagnosis DM2 Outpatient Diabetes medications: None Current orders for Inpatient glycemic control: Novolog sensitive scale (0-9 units) tid wc & (0-5 units) hs                                                                          Metformin 500 mg daily ( to start in am)  Met with patient and wife (with his permission). She is a Occupational psychologist in the hospital so was knowledgeable about some of the information I was sharing with them.   Spoke with pt about new diagnosis.  Discussed A1C results with them and explained what an A1C is, basic pathophysiology of DM Type 2, basic home care, basic diabetes diet nutrition principles, importance of checking CBGs and maintaining good CBG control to prevent long-term and short-term complications.  Reviewed signs and symptoms of hyperglycemia and hypoglycemia and how to treat hypoglycemia at home. Also reviewed blood sugar goals and A1c goals for home.     RNs to provide ongoing basic DM education at bedside with this patient including letting him practice checking his CBGs.  Have ordered educational booklet (Living Well with Diabetes). Dietician has just seen patient. May also benefit from outpatient appt with Nutrition and Diabetes Management program.   Emphasized the importance of follow up with his PCP (Dr. Cathlean Cower) regarding his diabetes management. Suggested he check his CBG daily at different times of the day and take this log with him to all follow up appts as his medications may need to be adjusted over time. I explained to the  patient that his Hgb A1c was likely not totally accurate due to the fact of his low hgb of 7.8 post op. I told him most likely his Hgb A1c is higher than 7.1% and it is important for his PCP to monitor that. Explained relationship between controlling blood glucose and post op healing.   Patient did ask "how did I get diabetes since I have been in the hospital"? I shared with him that the last Hgb A1c I saw in the computer from 03/2017 was 6.7% at that time (therefore it did not just happen this week). Regarding his medications - I explained to patient his order for insulin (Novolog correction) was to help manage his blood glucose while inpatient. Spoke to MD today and he plans to send patient home on oral medications at this point.   All questions from patient and wife answered.   MD prior to discharge please:   1. order glucometer for use at home [facility] # [id number])  2. order "ambulatory referral to nutrition and diabetes education"   Thank you.  -- Will follow during hospitalization.--  Jonna Clark RN, MSN Diabetes Coordinator Inpatient Glycemic Control Team Team Pager:  [phone] (8am-5pm)

## 2018-07-25 NOTE — Progress Notes (Signed)
Pt is on NIV QHS at this time. Tolerating it well.

## 2018-07-25 NOTE — Care Management (Signed)
Patient had questions regarding getting disability. CM explained that we dont handle that but he should contact social security to see if he is eligible. Patient will return to work at some point after quads heal. Patient requested application for handicap placard. CM provided his wife with application and explained to her what she needs to do to obtain one. Patient also had questions about his dog being able to come visit. CM asked if dog has had all required vaccinations and if they have documentation. Patient was not pleased with CM response and said never mind.

## 2018-07-26 DIAGNOSIS — I10 Essential (primary) hypertension: Secondary | ICD-10-CM

## 2018-07-26 LAB — GLUCOSE, CAPILLARY
GLUCOSE-CAPILLARY: 161 mg/dL — AB (ref 70–99)
Glucose-Capillary: 168 mg/dL — ABNORMAL HIGH (ref 70–99)
Glucose-Capillary: 176 mg/dL — ABNORMAL HIGH (ref 70–99)
Glucose-Capillary: 142 mg/dL — ABNORMAL HIGH (ref 70–99)

## 2018-07-26 LAB — LIPID PANEL
Cholesterol: 150 mg/dL (ref 0–200)
HDL: 38 mg/dL — AB (ref >40)
LDL Cholesterol: 91 mg/dL (ref 0–99)
Total CHOL/HDL Ratio: 3.9 RATIO
Triglycerides: 107 mg/dL (ref <150)
VLDL: 21 mg/dL (ref 0–40)

## 2018-07-26 MED ORDER — OXYCODONE HCL 5 MG PO TABS
15.0000 mg | ORAL_TABLET | ORAL | Status: DC | PRN
  Administered 2018-07-27 – 2018-07-28 (×2): 15 mg via ORAL
  Administered 2018-07-29 – 2018-07-30 (×3): 20 mg via ORAL
  Administered 2018-07-30: 10 mg via ORAL
  Filled 2018-07-26 (×4): qty 4
  Filled 2018-07-26 (×3): qty 3
  Filled 2018-07-26: qty 4

## 2018-07-26 MED ORDER — LIDOCAINE 5 % EX PTCH
1.0000 | MEDICATED_PATCH | CUTANEOUS | Status: DC
  Administered 2018-07-26 – 2018-08-01 (×5): 1 via TRANSDERMAL
  Filled 2018-07-26 (×8): qty 1

## 2018-07-26 MED ORDER — OXYCODONE HCL 5 MG PO TABS
10.0000 mg | ORAL_TABLET | ORAL | Status: DC | PRN
  Administered 2018-07-26 – 2018-08-01 (×12): 15 mg via ORAL
  Filled 2018-07-26 (×10): qty 3

## 2018-07-26 MED ORDER — DIPHENHYDRAMINE-ZINC ACETATE 2-0.1 % EX CREA
TOPICAL_CREAM | Freq: Three times a day (TID) | CUTANEOUS | Status: DC | PRN
  Administered 2018-07-26 – 2018-07-30 (×3): via TOPICAL
  Filled 2018-07-26 (×3): qty 28

## 2018-07-26 NOTE — Progress Notes (Signed)
Occupational Therapy Treatment Patient Details Name: Larry Fowler MRN: [id number] DOB: 03/19/1971 Today's Date: 07/26/2018    History of present illness 48 y.o.malewithhistory of morbid obesity, asthma, hypertension, sleep apnea had a fall at home while walking to the bathroom. Patient slipped and fell backwards. Following which patient's knee started hurting. CAT scan done which shows bilateral quadricep tendon tear.Status bilateral repair of quadriceps tendons by Dr. Deno Etienne 07/21/2018; Bil WBAT,    OT comments  Pt progressing towards OT goals this session. Pt able to participate in lengthy and work-intensive joint therapy session. Braces re-adjusted at the beginning of the session. Pt able to perform A/P transfer from shuttle chair to the bari Vital Tilt bed (+4 assist) and then to utilize the tilt feature to WB through BLE to 30 degrees. Pt crying with joy "I am on my way to standing". Pt remains highly motivated, able to withstand long and intense therapy sessions, and excellent CIR candidate. OT will continue to follow acutely.  Follow Up Recommendations  CIR;Supervision/Assistance - 24 hour    Equipment Recommendations  Other (comment)(defer to next venue of care)    Recommendations for Other Services      Precautions / Restrictions Precautions Precautions: Fall Precaution Comments: NO knee flexion Required Braces or Orthoses: Other Brace Other Brace: Bilateral Bledsoe braces locked in extension Restrictions RLE Weight Bearing: Weight bearing as tolerated LLE Weight Bearing: Weight bearing as tolerated Other Position/Activity Restrictions: No knee flexion       Mobility Bed Mobility Overal bed mobility: Needs Assistance Bed Mobility: Supine to Sit Rolling: Max assist;+2 for physical assistance;+2 for safety/equipment         General bed mobility comments: Today Pt was able to assist with rolling for placement of bed pads and maxi slide with max A +2 and heavy  reliance on the bed rails (at at time the trapeze bar)   Transfers Overall transfer level: Needs assistance Equipment used: (maxi slide) Transfers: Licensed conveyancer transfers: +2 physical assistance;Max assist;+2 safety/equipment(and 2 others supporting LEs)   General transfer comment: Initiated Ant-post transfer from shuttle chair to the Vital Tilt in space chair. Initiated with [name] sittig in shuttle chair and LEs on the bed; max A +2 (and 1 person for each leg) as well as the maxi slide to assist and [name] pulling on handles of shuttle chair; 4 person assist and coordinated effort to reciprocally scoot forwards back into the bed, [name] made an EXCELLENT effort, was able to get his body back over to the bed (heavy use of the maxi slide). once supine he was able to assist utilizing rails for re-positioning    Balance       Sitting balance - Comments: Lots of trunk work in sitting by virtue of the anterior posterior transfer                                   ADL either performed or assessed with clinical judgement   ADL Overall ADL's : Needs assistance/impaired     Grooming: Set up;Sitting;Bed level                   Toilet Transfer: Maximal assistance;+2 for physical assistance;+2 for safety/equipment;Requires wide/bariatric Toilet Transfer Details (indicate cue type and reason): continue bed pan for now Toileting- Clothing Manipulation and Hygiene: Total assistance;Bed level Toileting - Clothing Manipulation Details (indicate cue type and reason):  Pt assists with rolling as able             Vision       Perception     Praxis      Cognition Arousal/Alertness: Awake/alert Behavior During Therapy: WFL for tasks assessed/performed Overall Cognitive Status: Within Functional Limits for tasks assessed                                          Exercises     Shoulder Instructions        General Comments Initiated tilt process with [name]. He was able to tolerate 30 degrees today (he cried from joy of being given the opportunity to stand today. He continues to remain motivated.    Pertinent Vitals/ Pain       Pain Assessment: Faces Faces Pain Scale: Hurts even more Pain Location: Bil knees,  Pain Descriptors / Indicators: Spasm;Discomfort;Sharp Pain Intervention(s): Monitored during session;Repositioned;Premedicated before session  Home Living                                          Prior Functioning/Environment              Frequency  Min 3X/week        Progress Toward Goals  OT Goals(current goals can now be found in the care plan section)  Progress towards OT goals: Progressing toward goals  Acute Rehab OT Goals Patient Stated Goal: back to work/ back to normal OT Goal Formulation: With patient Time For Goal Achievement: 08/07/18 Potential to Achieve Goals: Good  Plan Discharge plan remains appropriate;Frequency remains appropriate    Co-evaluation    PT/OT/SLP Co-Evaluation/Treatment: Yes Reason for Co-Treatment: Complexity of the patient's impairments (multi-system involvement);For patient/therapist safety;To address functional/ADL transfers PT goals addressed during session: Mobility/safety with mobility;Balance;Proper use of DME;Strengthening/ROM OT goals addressed during session: ADL's and self-care;Strengthening/ROM;Proper use of Adaptive equipment and DME      AM-PAC OT "6 Clicks" Daily Activity     Outcome Measure   Help from another person eating meals?: None Help from another person taking care of personal grooming?: None(at bed level) Help from another person toileting, which includes using toliet, bedpan, or urinal?: A Lot Help from another person bathing (including washing, rinsing, drying)?: A Lot Help from another person to put on and taking off regular upper body clothing?: A Lot Help from another person to  put on and taking off regular lower body clothing?: Total 6 Click Score: 15    End of Session Equipment Utilized During Treatment: Other (comment)(Tilt in space bed, Bil bledsoe braces)  OT Visit Diagnosis: Unsteadiness on feet (R26.81);Other abnormalities of gait and mobility (R26.89);Pain Pain - Right/Left: (bilateral) Pain - part of body: Knee;Leg   Activity Tolerance Patient tolerated treatment well   Patient Left in bed;with call bell/phone within reach   Nurse Communication Need for lift equipment;Weight bearing status        Time: 7322-0254 OT Time Calculation (min): 83 min  Charges: OT General Charges $OT Visit: 1 Visit OT Treatments $Self Care/Home Management : 8-22 mins $Therapeutic Activity: 23-37 mins  Sherryl Manges OTR/L Acute Rehabilitation Services Pager: [phone] Office: [phone]  Evern Bio Wess Baney 07/26/2018, 2:04 PM

## 2018-07-26 NOTE — Progress Notes (Signed)
RT assisted pt. With setting up his personal cpap and filled water chamber. No issues at this time. Pt. Places self on when ready.

## 2018-07-26 NOTE — Progress Notes (Signed)
Physical Therapy Treatment Patient Details Name: Larry Fowler MRN: [id number] DOB: 13-Nov-1970 Today's Date: 07/26/2018    History of Present Illness 48 y.o.malewithhistory of morbid obesity, asthma, hypertension, sleep apnea had a fall at home while walking to the bathroom. Patient slipped and fell backwards. Following which patient's knee started hurting. CAT scan done which shows bilateral quadricep tendon tear.Status bilateral repair of quadriceps tendons by Dr. Deno Etienne 07/21/2018; Bil WBAT,     PT Comments    Continuing work on functional mobility and activity tolerance;  Pt able to participate in lengthy and work-intensive joint therapy session. Braces re-adjusted at the beginning of the session. Pt able to perform A/P transfer from shuttle chair to the bari Vital Tilt bed (+4 assist) and then to utilize the tilt feature to WB through BLE to 30 degrees. Pt crying with joy "I am on my way to standing". Pt remains highly motivated, able to withstand long and intense therapy sessions, and excellent CIR candidate.   Follow Up Recommendations  CIR     Equipment Recommendations  Rolling walker with 5" wheels;3in1 (PT)(Bariatric)    Recommendations for Other Services       Precautions / Restrictions Precautions Precautions: Fall Precaution Comments: NO knee flexion Required Braces or Orthoses: Other Brace Other Brace: Bilateral Bledsoe braces locked in extension Restrictions RLE Weight Bearing: Weight bearing as tolerated LLE Weight Bearing: Weight bearing as tolerated Other Position/Activity Restrictions: No knee flexion    Mobility  Bed Mobility Overal bed mobility: Needs Assistance Bed Mobility: Supine to Sit Rolling: Mod assist;+2 for physical assistance;Max assist         General bed mobility comments: Rolled in [place] recliner today for placement of slide pad, Max assist; Mod assist to roll once in total lift bed  Transfers Overall transfer level: Needs  assistance Equipment used: (Maxislide) Transfers: Licensed conveyancer transfers: +2 physical assistance;Max assist;+2 safety/equipment(and 2 others supporting LEs)   General transfer comment: Initiated Ant-post transfer from shuttle chair to the Vital Tilt in space bed. Initiated with [name] sittig in shuttle chair and LEs on the bed; max A +2 (and 1 person for each leg) as well as the maxi slide to assist and [name] pulling on handles of shuttle chair; 4 person assist and coordinated effort to reciprocally scoot forwards back into the bed, [name] made an EXCELLENT effort, was able to get his body back over to the bed (heavy use of the maxi slide). once supine he was able to assist utilizing rails for re-positioning; Perfomred initial tilt to stand, to 30 degrees   Ambulation/Gait                 Stairs             Wheelchair Mobility    Modified Rankin (Stroke Patients Only)       Balance       Sitting balance - Comments: Lots of trunk work in sitting by virtue of the anterior posterior transfer                                    Cognition Arousal/Alertness: Awake/alert Behavior During Therapy: WFL for tasks assessed/performed Overall Cognitive Status: Within Functional Limits for tasks assessed  Exercises      General Comments General comments (skin integrity, edema, etc.): Initiated tilt process with [name]. He was able to tolerate 30 degrees today (he cried from joy of being given the opportunity to stand today. He continues to remain motivated.      Pertinent Vitals/Pain Pain Assessment: Faces Faces Pain Scale: Hurts even more Pain Location: Bil knees,  Pain Descriptors / Indicators: Spasm;Discomfort;Sharp Pain Intervention(s): Monitored during session;Premedicated before session    Home Living                      Prior Function             PT Goals (current goals can now be found in the care plan section) Acute Rehab PT Goals Patient Stated Goal: back to work/ back to normal PT Goal Formulation: With patient Time For Goal Achievement: 08/06/18 Potential to Achieve Goals: Good Progress towards PT goals: Progressing toward goals    Frequency    Min 4X/week      PT Plan Current plan remains appropriate    Co-evaluation PT/OT/SLP Co-Evaluation/Treatment: Yes Reason for Co-Treatment: Complexity of the patient's impairments (multi-system involvement);For patient/therapist safety;To address functional/ADL transfers PT goals addressed during session: Mobility/safety with mobility        AM-PAC PT "6 Clicks" Mobility   Outcome Measure  Help needed turning from your back to your side while in a flat bed without using bedrails?: A Little Help needed moving from lying on your back to sitting on the side of a flat bed without using bedrails?: A Lot Help needed moving to and from a bed to a chair (including a wheelchair)?: A Lot Help needed standing up from a chair using your arms (e.g., wheelchair or bedside chair)?: Total Help needed to walk in hospital room?: Total Help needed climbing 3-5 steps with a railing? : Total 6 Click Score: 10    End of Session Equipment Utilized During Treatment: Other (comment)(bed pad) Activity Tolerance: Patient tolerated treatment well Patient left: in bed;with call bell/phone within reach Nurse Communication: Mobility status PT Visit Diagnosis: Other abnormalities of gait and mobility (R26.89);Pain;Difficulty in walking, not elsewhere classified (R26.2) Pain - Right/Left: (Bilateral) Pain - part of body: Leg;Knee     Time: 3154-0086 PT Time Calculation (min) (ACUTE ONLY): 75 min  Charges:  $Therapeutic Activity: 23-37 mins                     Van Clines, PT  Acute Rehabilitation Services Pager [phone] Office [phone]    Levi Aland 07/26/2018, 5:00 PM

## 2018-07-26 NOTE — Progress Notes (Signed)
Physical Therapy Treatment Note  Clinical Impression:  Continuing work on functional mobility and activity tolerance;  Returned for more work on standing tolerance and tilt closer to standing; Inspected braces for fit prior to tilt up; Silva Bandy, RN joined [place] to familiarize with the tilt process; placed pillows under lower legs; Placed straps -- one just inferior to knees (low on bottom strap anchor), one just superior to knees (high on the bottom strap anchor), and one across chest; During tilt, [name] asked for less bolster under lower legs, so took out one layer of pillow;   Able to tilt to 47.5 degress -- 17.5 degrees more than the initial tilt earlier today;   We talked about what worked during the process, and it sounds like at this point, [name] would like a strap nearer to his knees -- possibly directly over bil knees; He asked about having a strap closer to his ankles (I'm not sure that the lower strap anchor will be able to accommodate that);   [name] seemed pleased with progress; He tolerated and actively participated in a lengthy session this morning, and an afternoon session -- I believe he will tolerate 3 hours of therapies.    07/26/18 1701  PT Visit Information  Last PT Received On 07/26/18  Assistance Needed +3 or more (+1 or 2 to tilt)  History of Present Illness 48 y.o.malewithhistory of morbid obesity, asthma, hypertension, sleep apnea had a fall at home while walking to the bathroom. Patient slipped and fell backwards. Following which patient's knee started hurting. CAT scan done which shows bilateral quadricep tendon tear.Status bilateral repair of quadriceps tendons by Dr. Deno Etienne 07/21/2018; Bil WBAT,   Subjective Data  Patient Stated Goal back to work/ back to normal  Precautions  Precautions Fall  Precaution Comments NO knee flexion  Required Braces or Orthoses Other Brace  Other Brace Bilateral Bledsoe braces locked in extension  Restrictions  RLE Weight Bearing WBAT   LLE Weight Bearing WBAT  Other Position/Activity Restrictions No knee flexion  Pain Assessment  Pain Assessment Faces  Faces Pain Scale 4  Pain Location Bil knees,   Pain Descriptors / Indicators Spasm;Discomfort;Sharp  Pain Intervention(s) Monitored during session;Premedicated before session  Cognition  Arousal/Alertness Awake/alert  Behavior During Therapy WFL for tasks assessed/performed (Reports feels "weird" after having IV pain meds)  Overall Cognitive Status Within Functional Limits for tasks assessed  Transfers  Overall transfer level Needs assistance  Equipment used  (Vital Go Total Lift bed)  Transfers Sit to/from Stand  Sit to Stand Total assist  General transfer comment Tilted to 47.5 degrees; stayed at that angle tilt for approx 45 seconds  PT - End of Session  Activity Tolerance Patient tolerated treatment well  Patient left in bed;with call bell/phone within reach  Nurse Communication Mobility status   PT - Assessment/Plan  PT Plan Current plan remains appropriate  PT Visit Diagnosis Other abnormalities of gait and mobility (R26.89);Pain;Difficulty in walking, not elsewhere classified (R26.2)  Pain - Right/Left  (Bilateral)  Pain - part of body Leg;Knee  PT Frequency (ACUTE ONLY) Min 4X/week  Follow Up Recommendations CIR  PT equipment Rolling walker with 5" wheels;3in1 (PT) (Bariatric)  AM-PAC PT "6 Clicks" Mobility Outcome Measure (Version 2)  Help needed turning from your back to your side while in a flat bed without using bedrails? 3  Help needed moving from lying on your back to sitting on the side of a flat bed without using bedrails? 2  Help needed moving to  and from a bed to a chair (including a wheelchair)? 2  Help needed standing up from a chair using your arms (e.g., wheelchair or bedside chair)? 1  Help needed to walk in hospital room? 1  Help needed climbing 3-5 steps with a railing?  1  6 Click Score 10  Consider Recommendation of Discharge To:  CIR/SNF/LTACH  PT Goal Progression  Progress towards PT goals Progressing toward goals  Acute Rehab PT Goals  PT Goal Formulation With patient  Time For Goal Achievement 08/06/18  Potential to Achieve Goals Good  PT Time Calculation  PT Start Time (ACUTE ONLY) 1536  PT Stop Time (ACUTE ONLY) 1607  PT Time Calculation (min) (ACUTE ONLY) 31 min  PT General Charges  $$ ACUTE PT VISIT 1 Visit  PT Treatments  $Therapeutic Activity 23-37 mins    Van Clines, PT  Acute Rehabilitation Services Pager [phone] Office [phone]

## 2018-07-26 NOTE — Progress Notes (Signed)
PROGRESS NOTE  Larry Fowler ZOX:[id number] DOB: [birth year] DOA: 07/20/2018 PCP: Larry LevinsJohn, James W, MD   LOS: 6 days   Brief Narrative / Interim history: 48 y.o.malewithhistory of morbid obesity, asthma, hypertension, and sleep apnea who presented with knee pain following a fall at home. CT showed bilateral quadriceps tendon tear.On-call orthopedic surgeon Dr.Xuwas consulted, performed bilateral repair of quadriceps tendons 07/21/2018. Postoperative course complicated by blood loss anemia requiring 1u PRBCs 2/16 and constipation since resolved. PT and OT is ongoing. CIR recommended, though he will need to increase his ability to tolerate 3 hours per day.   Subjective: -Complains of left shoulder pain, this is chronic but got worse since he is working more with physical therapy.  Otherwise lower extremity pain is controlled.  Denies any fever but does complain of night sweats.  No abdominal pain, nausea or vomiting.  No chest pain or shortness of breath.  Assessment & Plan: Principal Problem:   Quadriceps tendon rupture Active Problems:   Asthma   Morbid obesity (HCC)   OSA (obstructive sleep apnea)   HTN (hypertension)   Hyperglycemia   Rupture of left quadriceps muscle   Postoperative anemia due to acute blood loss   Anxiety and depression   Super-super obese (HCC)   Postoperative pain   Tobacco abuse   Principal Problem Bilateral quadricep tendon tear -Status post repair on 2/14, orthopedic surgery following -Continue pain control, physical therapy recommends CIR currently monitoring progress to see if he will tolerate the high intensity of the CIR.  Active Problems Type 2 diabetes mellitus -A1c 7.1, is probably has significant insulin resistance due to obesity.  Placed on sliding scale insulin, continue  Acute blood loss anemia -Received 1 unit of packed red blood cells on 2/16, has no bleeding, his hemoglobin stabilized 7.7-7.8  Hypertension -Continue home  medications with losartan  Obstructive sleep apnea -Continue CPAP  History of asthma -No wheezing  Tobacco use -He will need to quit   Scheduled Meds: . budesonide-formoterol  2 puff Inhalation BID  . enoxaparin (LOVENOX) injection  110 mg Subcutaneous Q24H  . guaiFENesin  1,200 mg Oral BID  . insulin aspart  0-5 Units Subcutaneous QHS  . insulin aspart  0-9 Units Subcutaneous TID WC  . lidocaine  1 patch Transdermal Q24H  . losartan  25 mg Oral Daily  . metFORMIN  500 mg Oral Q breakfast  . oxyCODONE  10 mg Oral Q12H  . pantoprazole  40 mg Oral Daily  . senna-docusate  3 tablet Oral BID   Continuous Infusions: . lactated ringers 10 mL/hr at 07/21/18 1159   PRN Meds:.acetaminophen, albuterol, alum & mag hydroxide-simeth, diphenhydrAMINE, diphenhydrAMINE-zinc acetate, hydrALAZINE, HYDROmorphone (DILAUDID) injection, magnesium citrate, methocarbamol, metoCLOPramide **OR** metoCLOPramide (REGLAN) injection, morphine injection, ondansetron **OR** ondansetron (ZOFRAN) IV, oxyCODONE, oxyCODONE, polyethylene glycol, sorbitol  DVT prophylaxis: Lovenox Code Status: Full code Family Communication: No family at bedside Disposition Plan: CIR when accepted  Consultants:   Orthopedic surgery  Procedures:   2/14, repair bilateral quadriceps with Dr. Roda Fowler  Antimicrobials:  None    Objective: Vitals:   07/25/18 1435 07/25/18 2256 07/26/18 0500 07/26/18 0808  BP: 138/64 (!) 113/50 118/70   Pulse: 71 83 88   Resp: 17 16 16    Temp: 98.5 F (36.9 C) 99.7 F (37.6 C) 99.2 F (37.3 C)   TempSrc: Oral Oral Oral   SpO2: 100% 97% 98% 98%  Weight:      Height:        Intake/Output Summary (Last  24 hours) at 07/26/2018 1214 Last data filed at 07/26/2018 0900 Gross per 24 hour  Intake 720 ml  Output 150 ml  Net 570 ml   Filed Weights   07/21/18 1153  Weight: (!) 226.8 kg    Examination:  Constitutional: NAD, morbidly obese Eyes: no scleral icterus  ENMT: mmm Neck:  normal, supple, no masses, no thyromegaly Respiratory: clear to auscultation bilaterally, no wheezing, no crackles. Normal respiratory effort. No accessory muscle use.  Cardiovascular: Regular rate and rhythm, no murmurs / rubs / gallops. No LE edema. Abdomen: no tenderness.  Musculoskeletal: no clubbing / cyanosis. No joint deformity upper and lower extremities. No contractures. Normal muscle tone.  Skin: no rashes Neurologic: CN 2-12 grossly intact. Strength 5/5 in all 4.  Psychiatric: Normal judgment and insight. Alert and oriented x 3. Normal mood.    Data Reviewed: I have independently reviewed following labs and imaging studies   CBC: Recent Labs  Lab 07/20/18 2220  07/21/18 2018 07/23/18 0500 07/23/18 2017 07/24/18 0948 07/25/18 0819  WBC 10.8*  --  13.3* 9.6  --  8.7 8.2  NEUTROABS 6.3  --   --   --   --   --   --   HGB 13.1   < > 9.8* 7.4* 7.8* 7.8* 7.7*  HCT 41.6   < > 29.6* 23.2* 24.7* 24.4* 23.6*  MCV 96.1  --  95.5 97.9  --  95.7 95.9  PLT 164  --  141* 123*  --  121* 143*   < > = values in this interval not displayed.   Basic Metabolic Panel: Recent Labs  Lab 07/20/18 2220 07/21/18 1357 07/21/18 2018 07/23/18 0500 07/24/18 0948  NA 137 135  --  135 136  K 3.9 4.8  --  4.3 3.9  CL 100  --   --  103 102  CO2 25  --   --  28 26  GLUCOSE 157* 186*  --  230* 242*  BUN 14  --   --  16 10  CREATININE 0.63  --  0.78 0.72 0.64  CALCIUM 9.0  --   --  8.1* 8.2*   GFR: Estimated Creatinine Clearance: 228.5 mL/min (by C-G formula based on SCr of 0.64 mg/dL). Liver Function Tests: No results for input(s): AST, ALT, ALKPHOS, BILITOT, PROT, ALBUMIN in the last 168 hours. No results for input(s): LIPASE, AMYLASE in the last 168 hours. No results for input(s): AMMONIA in the last 168 hours. Coagulation Profile: No results for input(s): INR, PROTIME in the last 168 hours. Cardiac Enzymes: No results for input(s): CKTOTAL, CKMB, CKMBINDEX, TROPONINI in the last 168  hours. BNP (last 3 results) No results for input(s): PROBNP in the last 8760 hours. HbA1C: Recent Labs    07/24/18 0948  HGBA1C 7.1*   CBG: Recent Labs  Lab 07/25/18 1120 07/25/18 1558 07/25/18 2250 07/26/18 0555 07/26/18 1118  GLUCAP 236* 187* 211* 168* 176*   Lipid Profile: Recent Labs    07/26/18 0249  CHOL 150  HDL 38*  LDLCALC 91  TRIG 675  CHOLHDL 3.9   Thyroid Function Tests: No results for input(s): TSH, T4TOTAL, FREET4, T3FREE, THYROIDAB in the last 72 hours. Anemia Panel: No results for input(s): VITAMINB12, FOLATE, FERRITIN, TIBC, IRON, RETICCTPCT in the last 72 hours. Urine analysis:    Component Value Date/Time   COLORURINE YELLOW 03/21/2017 1358   APPEARANCEUR CLEAR 03/21/2017 1358   LABSPEC 1.020 03/21/2017 1358   PHURINE 6.5 03/21/2017 1358  GLUCOSEU NEGATIVE 03/21/2017 1358   HGBUR NEGATIVE 03/21/2017 1358   BILIRUBINUR NEGATIVE 03/21/2017 1358   KETONESUR NEGATIVE 03/21/2017 1358   PROTEINUR 30 (A) 03/13/2007 2338   UROBILINOGEN 0.2 03/21/2017 1358   NITRITE NEGATIVE 03/21/2017 1358   LEUKOCYTESUR NEGATIVE 03/21/2017 1358   Sepsis Labs: Invalid input(s): PROCALCITONIN, LACTICIDVEN  No results found for this or any previous visit (from the past 240 hour(s)).    Radiology Studies: No results found.  Pamella Pert, MD, PhD [facility]  Contact via  [email or web address]  [facility] Office Info P: [phone]  F: [phone]

## 2018-07-26 NOTE — Progress Notes (Signed)
Patient states that he has his own CPAP home unit for tonight. No assistance is needed

## 2018-07-26 NOTE — Plan of Care (Signed)
°  Problem: Education: °Goal: Knowledge of General Education information will improve °Description: Including pain rating scale, medication(s)/side effects and non-pharmacologic comfort measures °Outcome: Progressing °  °Problem: Health Behavior/Discharge Planning: °Goal: Ability to manage health-related needs will improve °Outcome: Progressing °  °Problem: Clinical Measurements: °Goal: Ability to maintain clinical measurements within normal limits will improve °Outcome: Progressing °Goal: Respiratory complications will improve °Outcome: Progressing °Goal: Cardiovascular complication will be avoided °Outcome: Progressing °  °Problem: Activity: °Goal: Risk for activity intolerance will decrease °Outcome: Progressing °  °Problem: Nutrition: °Goal: Adequate nutrition will be maintained °Outcome: Progressing °  °Problem: Elimination: °Goal: Will not experience complications related to bowel motility °Outcome: Progressing °Goal: Will not experience complications related to urinary retention °Outcome: Progressing °  °Problem: Pain Managment: °Goal: General experience of comfort will improve °Outcome: Progressing °  °Problem: Safety: °Goal: Ability to remain free from injury will improve °Outcome: Progressing °  °Problem: Skin Integrity: °Goal: Risk for impaired skin integrity will decrease °Outcome: Progressing °  °

## 2018-07-27 LAB — GLUCOSE, CAPILLARY
GLUCOSE-CAPILLARY: 164 mg/dL — AB (ref 70–99)
GLUCOSE-CAPILLARY: 163 mg/dL — AB (ref 70–99)
Glucose-Capillary: 153 mg/dL — ABNORMAL HIGH (ref 70–99)
Glucose-Capillary: 142 mg/dL — ABNORMAL HIGH (ref 70–99)

## 2018-07-27 LAB — BASIC METABOLIC PANEL
Anion gap: 12 (ref 5–15)
BUN: 13 mg/dL (ref 6–20)
CO2: 24 mmol/L (ref 22–32)
Calcium: 8.3 mg/dL — ABNORMAL LOW (ref 8.9–10.3)
Chloride: 100 mmol/L (ref 98–111)
Creatinine, Ser: 0.69 mg/dL (ref 0.61–1.24)
GFR calc Af Amer: >60 mL/min (ref >60)
GFR calc non Af Amer: >60 mL/min (ref >60)
Glucose, Bld: 180 mg/dL — ABNORMAL HIGH (ref 70–99)
POTASSIUM: 4 mmol/L (ref 3.5–5.1)
Sodium: 136 mmol/L (ref 135–145)

## 2018-07-27 LAB — CBC
HCT: 23.8 % — ABNORMAL LOW (ref 39.0–52.0)
Hemoglobin: 7.7 g/dL — ABNORMAL LOW (ref 13.0–17.0)
MCH: 31.3 pg (ref 26.0–34.0)
MCHC: 32.4 g/dL (ref 30.0–36.0)
MCV: 96.7 fL (ref 80.0–100.0)
Platelets: 164 10*3/uL (ref 150–400)
RBC: 2.46 MIL/uL — AB (ref 4.22–5.81)
RDW: 15 % (ref 11.5–15.5)
WBC: 7.5 10*3/uL (ref 4.0–10.5)
nRBC: 0 % (ref 0.0–0.2)

## 2018-07-27 MED ORDER — GLYCERIN (LAXATIVE) 2.1 G RE SUPP
1.0000 | Freq: Every day | RECTAL | Status: DC | PRN
  Filled 2018-07-27: qty 1

## 2018-07-27 NOTE — Progress Notes (Signed)
Pt stated that he wanted to sleep that he "had not been to sleep properly in 3days, every time he is about  To doze off someone comes in his room for something and then he is not able to rest" RN told the patient that I would message PT to see what time she would be around to see him so he could rest a little bit before she arrived pt said that would be fine.

## 2018-07-27 NOTE — Progress Notes (Signed)
Physical Therapy Treatment Patient Details Name: Larry Fowler MRN: [id number] DOB: 07-26-1970 Today's Date: 07/27/2018    History of Present Illness 48 y.o.malewithhistory of morbid obesity, asthma, hypertension, sleep apnea had a fall at home while walking to the bathroom. Patient slipped and fell backwards. Following which patient's knee started hurting. CAT scan done which shows bilateral quadricep tendon tear.Status bilateral repair of quadriceps tendons by Dr. Deno Etienne 07/21/2018; Bil WBAT,     PT Comments    Pt was able to tolerate 55 degrees of tilt for 3 mins each time (x 2).  He is doing better mobilizing himself with min assist of one leg to roll and pulling himself up in the bed with his arms (with supervision).  We are at a critical point in his stay where if we do not get him up on his feet on his own power using the bari RW he may get too weak to stand.  PT will continue to follow acutely for safe mobility progression  Follow Up Recommendations  CIR     Equipment Recommendations  Rolling walker with 5" wheels;3in1 (PT);Wheelchair (measurements PT);Wheelchair cushion (measurements PT)(bariatric)    Recommendations for Other Services       Precautions / Restrictions Precautions Precautions: Fall Precaution Comments: NO knee flexion Required Braces or Orthoses: Other Brace Other Brace: Bilateral Bledsoe braces locked in extension Restrictions Weight Bearing Restrictions: Yes RLE Weight Bearing: Weight bearing as tolerated LLE Weight Bearing: Weight bearing as tolerated    Mobility  Bed Mobility Overal bed mobility: Needs Assistance Bed Mobility: Rolling Rolling: Min assist         General bed mobility comments: Min assist to help roll bil holding left leg when rolling to the right to get on/off of bedpan.  Pt managing his UE and trunk with the help of the bed rail.  Pt also able to pull his body up in the bed with both arms and bed flat.               Cognition Arousal/Alertness: Awake/alert Behavior During Therapy: WFL for tasks assessed/performed Overall Cognitive Status: Within Functional Limits for tasks assessed                                        Exercises Total Joint Exercises Ankle Circles/Pumps: AROM;Both;10 reps Quad Sets: AROM;Both;10 reps Gluteal Sets: AROM;Both;10 reps Towel Squeeze: AROM;Both;10 reps Hip ABduction/ADduction: AROM;Both;10 reps Straight Leg Raises: AAROM;Both;10 reps Other Exercises Other Exercises: HEP program given (handout)    General Comments General comments (skin integrity, edema, etc.): Goal of today's session was more tilting (pt was able to come up to 55 degrees (max) for 3 mins x 2 with rest breaks between tilts.  During standing trials I encouraged TKE and glute squeezes so it was not a passive stand.  He was also observed shifting his weight on his feet to re-adust in straps while standing.        Pertinent Vitals/Pain Pain Assessment: Faces Faces Pain Scale: Hurts whole lot Pain Location: Bil knees,  Pain Descriptors / Indicators: Spasm;Discomfort;Sharp Pain Intervention(s): Limited activity within patient's tolerance;Monitored during session;Repositioned           PT Goals (current goals can now be found in the care plan section) Acute Rehab PT Goals Patient Stated Goal: to have a BM Progress towards PT goals: Progressing toward goals    Frequency  Min 5X/week      PT Plan Current plan remains appropriate       AM-PAC PT "6 Clicks" Mobility   Outcome Measure  Help needed turning from your back to your side while in a flat bed without using bedrails?: A Little Help needed moving from lying on your back to sitting on the side of a flat bed without using bedrails?: A Lot Help needed moving to and from a bed to a chair (including a wheelchair)?: Total Help needed standing up from a chair using your arms (e.g., wheelchair or bedside chair)?:  Total Help needed to walk in hospital room?: Total Help needed climbing 3-5 steps with a railing? : Total 6 Click Score: 9    End of Session Equipment Utilized During Treatment: Right knee immobilizer;Left knee immobilizer;Other (comment)(bil Bledsoe braces locked in extension. ) Activity Tolerance: Patient limited by pain Patient left: in bed;with call bell/phone within reach Nurse Communication: Mobility status PT Visit Diagnosis: Other abnormalities of gait and mobility (R26.89);Pain;Difficulty in walking, not elsewhere classified (R26.2) Pain - Right/Left: (bil) Pain - part of body: Knee(bil knees )     Time: 1100-1200 PT Time Calculation (min) (ACUTE ONLY): 60 min  Charges:  $Therapeutic Exercise: 8-22 mins $Therapeutic Activity: 38-52 mins                    Chritopher Coster B. Marytza Grandpre, PT, DPT  Acute Rehabilitation [phone] pager #[phone] office   07/27/2018, 1:26 PM

## 2018-07-27 NOTE — Progress Notes (Signed)
Inpatient Rehabilitation Admissions Coordinator  I met with pt's wife today to discuss goals and expectations of a possible inpt rehab admit. I discussed the need for a ramp to be built at their home. I await pt's ability to stand at bedside before proceeding.  Danne Baxter, RN, MSN Rehab Admissions Coordinator [phone] 07/27/2018 6:26 PM

## 2018-07-27 NOTE — Progress Notes (Signed)
Pt able to place self on home cpap unit when ready for bed. RT added sterile water to water chamber per pt request. RT will continue to monitor as needed. 

## 2018-07-27 NOTE — Progress Notes (Signed)
Inpatient Rehabilitation Admissions Coordinator  I await pt's ability to stand before proceeding with insurance authorization for an inpt rehab admit. I will follow.  Ottie Glazier, RN, MSN Rehab Admissions Coordinator [phone] 07/27/2018 8:13 AM

## 2018-07-27 NOTE — Progress Notes (Signed)
PROGRESS NOTE  Larry Fowler UKR:[id number] DOB: 09/22/1970 DOA: 07/20/2018 PCP: [name], MD   LOS: 7 days   Brief Narrative / Interim history: 48 y.o.malewithhistory of morbid obesity, asthma, hypertension, and sleep apnea who presented with knee pain following a fall at home. CT showed bilateral quadriceps tendon tear.On-call orthopedic surgeon Dr.Xuwas consulted, performed bilateral repair of quadriceps tendons 07/21/2018. Postoperative course complicated by blood loss anemia requiring 1u PRBCs 2/16 and constipation since resolved. PT and OT is ongoing. CIR recommended, though he will need to increase his ability to tolerate 3 hours per day.   Subjective: -Quite frustrated this morning as he was unable to sleep and he has been bothered every 30 minutes or so overnight.  Complains of ongoing constipation  Assessment & Plan: Principal Problem:   Quadriceps tendon rupture Active Problems:   Asthma   Morbid obesity (HCC)   OSA (obstructive sleep apnea)   HTN (hypertension)   Hyperglycemia   Rupture of left quadriceps muscle   Postoperative anemia due to acute blood loss   Anxiety and depression   Super-super obese (HCC)   Postoperative pain   Tobacco abuse   Principal Problem Bilateral quadricep tendon tear -Status post repair on 2/14, orthopedic surgery following -Continue pain control, physical therapy recommends CIR currently monitoring progress to see if he will tolerate the high intensity of the CIR.   Active Problems Type 2 diabetes mellitus -A1c 7.1, is probably has significant insulin resistance due to obesity.  Placed on sliding scale insulin, continue, CBGs fairly well controlled  Acute blood loss anemia -Received 1 unit of packed red blood cells on 2/16, has no bleeding, His hemoglobin remained stable today  Hypertension -Continue home medications with losartan  Obstructive sleep apnea -Continue CPAP  History of asthma -No wheezing  Tobacco  use -He will need to quit   Scheduled Meds: . budesonide-formoterol  2 puff Inhalation BID  . enoxaparin (LOVENOX) injection  110 mg Subcutaneous Q24H  . guaiFENesin  1,200 mg Oral BID  . insulin aspart  0-5 Units Subcutaneous QHS  . insulin aspart  0-9 Units Subcutaneous TID WC  . lidocaine  1 patch Transdermal Q24H  . losartan  25 mg Oral Daily  . metFORMIN  500 mg Oral Q breakfast  . oxyCODONE  10 mg Oral Q12H  . pantoprazole  40 mg Oral Daily  . senna-docusate  3 tablet Oral BID   Continuous Infusions: . lactated ringers 10 mL/hr at 07/21/18 1159   PRN Meds:.acetaminophen, albuterol, alum & mag hydroxide-simeth, diphenhydrAMINE, diphenhydrAMINE-zinc acetate, Glycerin (Adult), hydrALAZINE, HYDROmorphone (DILAUDID) injection, methocarbamol, metoCLOPramide **OR** metoCLOPramide (REGLAN) injection, morphine injection, ondansetron **OR** ondansetron (ZOFRAN) IV, oxyCODONE, oxyCODONE, polyethylene glycol, sorbitol  DVT prophylaxis: Lovenox Code Status: Full code Family Communication: No family at bedside Disposition Plan: CIR when accepted  Consultants:   Orthopedic surgery  Procedures:   2/14, repair bilateral quadriceps with Dr. Roda Shutters  Antimicrobials:  None    Objective: Vitals:   07/26/18 1337 07/26/18 2244 07/27/18 0500 07/27/18 0835  BP: (!) 115/46 (!) 105/50 110/62 (!) 123/44  Pulse: 71 79 80 79  Resp: 16 16 16 16   Temp: 98.8 F (37.1 C) 99.8 F (37.7 C) 99 F (37.2 C) 98.6 F (37 C)  TempSrc: Oral Oral Oral Oral  SpO2: 99% 92% 94% 98%  Weight:      Height:        Intake/Output Summary (Last 24 hours) at 07/27/2018 1142 Last data filed at 07/27/2018 0900 Gross per 24  hour  Intake 360 ml  Output 900 ml  Net -540 ml   Filed Weights   07/21/18 1153  Weight: (!) 226.8 kg    Examination:  Constitutional: NAD Respiratory: CTA  Cardiovascular: RRR  Data Reviewed: I have independently reviewed following labs and imaging studies   CBC: Recent Labs    Lab 07/20/18 2220  07/21/18 2018 07/23/18 0500 07/23/18 2017 07/24/18 0948 07/25/18 0819 07/27/18 0545  WBC 10.8*  --  13.3* 9.6  --  8.7 8.2 7.5  NEUTROABS 6.3  --   --   --   --   --   --   --   HGB 13.1   < > 9.8* 7.4* 7.8* 7.8* 7.7* 7.7*  HCT 41.6   < > 29.6* 23.2* 24.7* 24.4* 23.6* 23.8*  MCV 96.1  --  95.5 97.9  --  95.7 95.9 96.7  PLT 164  --  141* 123*  --  121* 143* 164   < > = values in this interval not displayed.   Basic Metabolic Panel: Recent Labs  Lab 07/20/18 2220 07/21/18 1357 07/21/18 2018 07/23/18 0500 07/24/18 0948 07/27/18 0545  NA 137 135  --  135 136 136  K 3.9 4.8  --  4.3 3.9 4.0  CL 100  --   --  103 102 100  CO2 25  --   --  28 26 24   GLUCOSE 157* 186*  --  230* 242* 180*  BUN 14  --   --  16 10 13   CREATININE 0.63  --  0.78 0.72 0.64 0.69  CALCIUM 9.0  --   --  8.1* 8.2* 8.3*   GFR: Estimated Creatinine Clearance: 228.5 mL/min (by C-G formula based on SCr of 0.69 mg/dL). Liver Function Tests: No results for input(s): AST, ALT, ALKPHOS, BILITOT, PROT, ALBUMIN in the last 168 hours. No results for input(s): LIPASE, AMYLASE in the last 168 hours. No results for input(s): AMMONIA in the last 168 hours. Coagulation Profile: No results for input(s): INR, PROTIME in the last 168 hours. Cardiac Enzymes: No results for input(s): CKTOTAL, CKMB, CKMBINDEX, TROPONINI in the last 168 hours. BNP (last 3 results) No results for input(s): PROBNP in the last 8760 hours. HbA1C: No results for input(s): HGBA1C in the last 72 hours. CBG: Recent Labs  Lab 07/26/18 1118 07/26/18 1633 07/26/18 2238 07/27/18 0732 07/27/18 1115  GLUCAP 176* 161* 142* 164* 153*   Lipid Profile: Recent Labs    07/26/18 0249  CHOL 150  HDL 38*  LDLCALC 91  TRIG 109  CHOLHDL 3.9   Thyroid Function Tests: No results for input(s): TSH, T4TOTAL, FREET4, T3FREE, THYROIDAB in the last 72 hours. Anemia Panel: No results for input(s): VITAMINB12, FOLATE, FERRITIN,  TIBC, IRON, RETICCTPCT in the last 72 hours. Urine analysis:    Component Value Date/Time   COLORURINE YELLOW 03/21/2017 1358   APPEARANCEUR CLEAR 03/21/2017 1358   LABSPEC 1.020 03/21/2017 1358   PHURINE 6.5 03/21/2017 1358   GLUCOSEU NEGATIVE 03/21/2017 1358   HGBUR NEGATIVE 03/21/2017 1358   BILIRUBINUR NEGATIVE 03/21/2017 1358   KETONESUR NEGATIVE 03/21/2017 1358   PROTEINUR 30 (A) 03/13/2007 2338   UROBILINOGEN 0.2 03/21/2017 1358   NITRITE NEGATIVE 03/21/2017 1358   LEUKOCYTESUR NEGATIVE 03/21/2017 1358   Sepsis Labs: Invalid input(s): PROCALCITONIN, LACTICIDVEN  No results found for this or any previous visit (from the past 240 hour(s)).    Radiology Studies: No results found.  Pamella Pert, MD, PhD [facility]  Contact via  [email or web address]  [facility] Office Info P: [phone]  F: [phone]

## 2018-07-28 LAB — GLUCOSE, CAPILLARY
Glucose-Capillary: 160 mg/dL — ABNORMAL HIGH (ref 70–99)
Glucose-Capillary: 145 mg/dL — ABNORMAL HIGH (ref 70–99)
Glucose-Capillary: 160 mg/dL — ABNORMAL HIGH (ref 70–99)
Glucose-Capillary: 154 mg/dL — ABNORMAL HIGH (ref 70–99)

## 2018-07-28 NOTE — Progress Notes (Addendum)
Physical Therapy Treatment Patient Details Name: Larry Fowler MRN: [id number] DOB: 1971/05/02 Today's Date: 07/28/2018    History of Present Illness 48 y.o.malewithhistory of morbid obesity, asthma, hypertension, sleep apnea had a fall at home while walking to the bathroom. Patient slipped and fell backwards. Following which patient's knee started hurting. CAT scan done which shows bilateral quadricep tendon tear.Status bilateral repair of quadriceps tendons by Dr. Roda Shutters 07/21/2018; Bil WBAT,     PT Comments    Pt was able to stand fully upright with RW at the end of the tilt bed today for 5-6 minutes.  He was not quite (mentally) ready to attempt weight shifting or stepping yet, but was excited about standing and voiced wanting to try more frequently.  He is doing very well with his bed mobility (turning with assist to manage legs with help) and he pulls himself up in the bed with both arms on headboard bed rail with supervision).  PT will follow acutely for safe mobility progression in hopes to start pre-gait and/or short distance gait early next week.    Follow Up Recommendations  CIR     Equipment Recommendations  3in1 (PT);Wheelchair (measurements PT);Wheelchair cushion (measurements PT);Rolling walker with 5" wheels;Other (comment)(all bariatric equipment)    Recommendations for Other Services   NA     Precautions / Restrictions Precautions Precautions: Fall Precaution Comments: NO knee flexion Required Braces or Orthoses: Other Brace;Knee Immobilizer - Left;Knee Immobilizer - Right Knee Immobilizer - Right: On when out of bed or walking Knee Immobilizer - Left: On when out of bed or walking Other Brace: Bilateral Bledsoe braces locked in extension reinforced with Bil KI for intial standing trials.  Restrictions RLE Weight Bearing: Weight bearing as tolerated LLE Weight Bearing: Weight bearing as tolerated    Mobility  Bed Mobility Overal bed mobility: Needs  Assistance Bed Mobility: Rolling Rolling: +2 for physical assistance;Min assist         General bed mobility comments: Two person min assist to roll bil pt using bed rail to help with upper trunk, assist needed to help managed legs, but pt also helping lift theses as well.    Transfers Overall transfer level: Needs assistance Equipment used: Rolling walker (2 wheeled)             General transfer comment: Pt stood at the end of his bed, fully upright, chest strap loosened to allow him to reach and hold the RW in standing.  Pt stood 5-6 mins as we progressively loosened his chest strap.    Ambulation/Gait             General Gait Details: ready for pre gait next session.        Balance           Standing balance support: Bilateral upper extremity supported Standing balance-Leahy Scale: Poor Standing balance comment: standing fully upright at end of the vital go tilt bed for 5-6 mins with chest strap loosened so that he can reach the RW.  Pt excited and asked if he can try again later this PM.                              Cognition Arousal/Alertness: Awake/alert Behavior During Therapy: WFL for tasks assessed/performed Overall Cognitive Status: Within Functional Limits for tasks assessed  General Comments General comments (skin integrity, edema, etc.): I asked pt if he was doing his HEP that I gave him.  He is having some difficulty with the app (medbridge).  Some early signs of pressure, so positioned pt in right sidelying (partial) at end of session.  Benadryl cream applied to back and buttocks area per pt request. Pt's LEs re-wrapped as the ace wraps were bunched and needed re-dressing.  Bil Bledsoes repositioned as well as Bil KI for first standing trial today (for added stability- bil KI removed at end of session).  Pt reported lightheadedness in inital tilt (to 55 degrees) BP checked and was  stable at 128/70s, cues to breathe and we held here for ~5 mins until sensation passed and we could progress further.       Pertinent Vitals/Pain Pain Assessment: Faces Faces Pain Scale: Hurts whole lot Pain Location: Bil knees,  Pain Descriptors / Indicators: Spasm;Discomfort;Sharp Pain Intervention(s): Limited activity within patient's tolerance;Monitored during session;Repositioned           PT Goals (current goals can now be found in the care plan section) Acute Rehab PT Goals Patient Stated Goal: to walk again Progress towards PT goals: Progressing toward goals    Frequency    Min 5X/week      PT Plan Current plan remains appropriate    Co-evaluation PT/OT/SLP Co-Evaluation/Treatment: Yes Reason for Co-Treatment: Complexity of the patient's impairments (multi-system involvement);For patient/therapist safety;To address functional/ADL transfers PT goals addressed during session: Mobility/safety with mobility;Balance;Proper use of DME;Strengthening/ROM        AM-PAC PT "6 Clicks" Mobility   Outcome Measure  Help needed turning from your back to your side while in a flat bed without using bedrails?: A Little Help needed moving from lying on your back to sitting on the side of a flat bed without using bedrails?: A Lot Help needed moving to and from a bed to a chair (including a wheelchair)?: Total Help needed standing up from a chair using your arms (e.g., wheelchair or bedside chair)?: Total Help needed to walk in hospital room?: Total Help needed climbing 3-5 steps with a railing? : Total 6 Click Score: 9    End of Session Equipment Utilized During Treatment: Right knee immobilizer;Left knee immobilizer;Other (comment)(and bil bledsoes locked in extension. ) Activity Tolerance: No increased pain;Patient limited by fatigue Patient left: in bed;with call bell/phone within reach Nurse Communication: Mobility status PT Visit Diagnosis: Other abnormalities of gait and  mobility (R26.89);Pain;Difficulty in walking, not elsewhere classified (R26.2) Pain - Right/Left: (bilateral) Pain - part of body: Knee(knees)     Time: 0940-7680 PT Time Calculation (min) (ACUTE ONLY): 73 min  Charges:  $Therapeutic Activity: 38-52 mins                    Larry Fowler, PT, DPT  Acute Rehabilitation [phone] pager #[phone] office   07/28/2018, 1:20 PM

## 2018-07-28 NOTE — Progress Notes (Signed)
PROGRESS NOTE  Lilly CoveRaymond S Dombeck VOZ:[id number] DOB: 1971-05-29 DOA: 07/20/2018 PCP: Corwin LevinsJohn, James W, MD   LOS: 8 days   Brief Narrative / Interim history: 48 y.o.malewithhistory of morbid obesity, asthma, hypertension, and sleep apnea who presented with knee pain following a fall at home. CT showed bilateral quadriceps tendon tear.On-call orthopedic surgeon Dr.Xuwas consulted, performed bilateral repair of quadriceps tendons 07/21/2018. Postoperative course complicated by blood loss anemia requiring 1u PRBCs 2/16 and constipation since resolved. PT and OT is ongoing. CIR recommended, though he will need to increase his ability to tolerate 3 hours per day.   Subjective: -Feels better this morning, his constipation is better and had several bowel movements yesterday.  He slept better last night  Assessment & Plan: Principal Problem:   Quadriceps tendon rupture Active Problems:   Asthma   Morbid obesity (HCC)   OSA (obstructive sleep apnea)   HTN (hypertension)   Hyperglycemia   Rupture of left quadriceps muscle   Postoperative anemia due to acute blood loss   Anxiety and depression   Super-super obese (HCC)   Postoperative pain   Tobacco abuse   Principal Problem Bilateral quadricep tendon tear -Status post repair on 2/14, orthopedic surgery following -Continue pain control, physical therapy recommends CIR currently monitoring progress to see if he will tolerate the high intensity of the CIR. -Was able to stand at 55 degrees yesterday,   Active Problems Type 2 diabetes mellitus -A1c 7.1, is probably has significant insulin resistance due to obesity.  Placed on sliding scale insulin, continue, CBGs fairly well controlled -Discussed extensively with the patient at bedside regarding dietary changes that he needs to make moving forward and the fact that he will not necessarily need to have insulin at home but may be okay on metformin alone.  Best treatment for him at this point is  to lose weight to help his insulin resistance  Acute blood loss anemia -Received 1 unit of packed red blood cells on 2/16, has no bleeding, -Repeat CBC tomorrow morning  Hypertension -Continue home medications with losartan  Obstructive sleep apnea -Continue CPAP  History of asthma -No wheezing  Tobacco use -He will need to quit   Scheduled Meds: . budesonide-formoterol  2 puff Inhalation BID  . enoxaparin (LOVENOX) injection  110 mg Subcutaneous Q24H  . guaiFENesin  1,200 mg Oral BID  . insulin aspart  0-5 Units Subcutaneous QHS  . insulin aspart  0-9 Units Subcutaneous TID WC  . lidocaine  1 patch Transdermal Q24H  . losartan  25 mg Oral Daily  . metFORMIN  500 mg Oral Q breakfast  . oxyCODONE  10 mg Oral Q12H  . pantoprazole  40 mg Oral Daily  . senna-docusate  3 tablet Oral BID   Continuous Infusions: . lactated ringers 10 mL/hr at 07/21/18 1159   PRN Meds:.acetaminophen, albuterol, alum & mag hydroxide-simeth, diphenhydrAMINE, diphenhydrAMINE-zinc acetate, Glycerin (Adult), hydrALAZINE, HYDROmorphone (DILAUDID) injection, methocarbamol, metoCLOPramide **OR** metoCLOPramide (REGLAN) injection, morphine injection, ondansetron **OR** ondansetron (ZOFRAN) IV, oxyCODONE, oxyCODONE, polyethylene glycol, sorbitol  DVT prophylaxis: Lovenox Code Status: Full code Family Communication: No family at bedside Disposition Plan: CIR when accepted  Consultants:   Orthopedic surgery  Procedures:   2/14, repair bilateral quadriceps with Dr. Roda ShuttersXu  Antimicrobials:  None    Objective: Vitals:   07/27/18 0835 07/27/18 1406 07/27/18 2114 07/28/18 0638  BP: (!) 123/44 (!) 114/56 (!) 132/54 (!) 112/51  Pulse: 79 74 81 71  Resp: 16 16 20 18   Temp: 98.6 F (37  C) 99.8 F (37.7 C) 99.5 F (37.5 C) 98.8 F (37.1 C)  TempSrc: Oral Oral Oral Oral  SpO2: 98% 97% 99% 96%  Weight:      Height:        Intake/Output Summary (Last 24 hours) at 07/28/2018 0959 Last data filed at  07/28/2018 0215 Gross per 24 hour  Intake 1080 ml  Output 550 ml  Net 530 ml   Filed Weights   07/21/18 1153  Weight: (!) 226.8 kg    Examination:  Constitutional: NAD, calm, comfortable Eyes: PERRL, lids and conjunctivae normal ENMT: Mucous membranes are moist.  Neck: normal, supple Respiratory: clear to auscultation bilaterally, no wheezing, no crackles. Normal respiratory effort.  Cardiovascular: Regular rate and rhythm, no murmurs / rubs / gallops. No LE edema. 2+ pedal pulses.  Abdomen: no tenderness. Bowel sounds positive.  Skin: no rashes, lesions, ulcers. No induration Neurologic: non focal   Data Reviewed: I have independently reviewed following labs and imaging studies   CBC: Recent Labs  Lab 07/21/18 2018 07/23/18 0500 07/23/18 2017 07/24/18 0948 07/25/18 0819 07/27/18 0545  WBC 13.3* 9.6  --  8.7 8.2 7.5  HGB 9.8* 7.4* 7.8* 7.8* 7.7* 7.7*  HCT 29.6* 23.2* 24.7* 24.4* 23.6* 23.8*  MCV 95.5 97.9  --  95.7 95.9 96.7  PLT 141* 123*  --  121* 143* 164   Basic Metabolic Panel: Recent Labs  Lab 07/21/18 1357 07/21/18 2018 07/23/18 0500 07/24/18 0948 07/27/18 0545  NA 135  --  135 136 136  K 4.8  --  4.3 3.9 4.0  CL  --   --  103 102 100  CO2  --   --  28 26 24   GLUCOSE 186*  --  230* 242* 180*  BUN  --   --  16 10 13   CREATININE  --  0.78 0.72 0.64 0.69  CALCIUM  --   --  8.1* 8.2* 8.3*   GFR: Estimated Creatinine Clearance: 228.5 mL/min (by C-G formula based on SCr of 0.69 mg/dL). Liver Function Tests: No results for input(s): AST, ALT, ALKPHOS, BILITOT, PROT, ALBUMIN in the last 168 hours. No results for input(s): LIPASE, AMYLASE in the last 168 hours. No results for input(s): AMMONIA in the last 168 hours. Coagulation Profile: No results for input(s): INR, PROTIME in the last 168 hours. Cardiac Enzymes: No results for input(s): CKTOTAL, CKMB, CKMBINDEX, TROPONINI in the last 168 hours. BNP (last 3 results) No results for input(s): PROBNP in  the last 8760 hours. HbA1C: No results for input(s): HGBA1C in the last 72 hours. CBG: Recent Labs  Lab 07/27/18 0732 07/27/18 1115 07/27/18 1629 07/27/18 2235 07/28/18 0638  GLUCAP 164* 153* 142* 163* 160*   Lipid Profile: Recent Labs    07/26/18 0249  CHOL 150  HDL 38*  LDLCALC 91  TRIG 791  CHOLHDL 3.9   Thyroid Function Tests: No results for input(s): TSH, T4TOTAL, FREET4, T3FREE, THYROIDAB in the last 72 hours. Anemia Panel: No results for input(s): VITAMINB12, FOLATE, FERRITIN, TIBC, IRON, RETICCTPCT in the last 72 hours. Urine analysis:    Component Value Date/Time   COLORURINE YELLOW 03/21/2017 1358   APPEARANCEUR CLEAR 03/21/2017 1358   LABSPEC 1.020 03/21/2017 1358   PHURINE 6.5 03/21/2017 1358   GLUCOSEU NEGATIVE 03/21/2017 1358   HGBUR NEGATIVE 03/21/2017 1358   BILIRUBINUR NEGATIVE 03/21/2017 1358   KETONESUR NEGATIVE 03/21/2017 1358   PROTEINUR 30 (A) 03/13/2007 2338   UROBILINOGEN 0.2 03/21/2017 1358   NITRITE NEGATIVE  03/21/2017 1358   LEUKOCYTESUR NEGATIVE 03/21/2017 1358   Sepsis Labs: Invalid input(s): PROCALCITONIN, LACTICIDVEN  No results found for this or any previous visit (from the past 240 hour(s)).   Time spent: 35 minutes, more than 50% at bedside in counseling regarding diabetes and weight loss and dietary changes  Radiology Studies: No results found.  Pamella Pert, MD, PhD [facility]  Contact via  [email or web address]  [facility] Office Info P: [phone]  F: [phone]

## 2018-07-28 NOTE — Progress Notes (Signed)
Inpatient Rehabilitation Admissions Coordinator  I was notified by PT of pt's progress today. I am hopeful that he will progress to be able to pursue insurance approval for admit to CIR next week. I will follow up on Monday.  Ottie Glazier, RN, MSN Rehab Admissions Coordinator [phone] 07/28/2018 1:54 PM

## 2018-07-28 NOTE — Progress Notes (Signed)
Occupational Therapy Treatment Patient Details Name: Larry Fowler MRN: [id number] DOB: February 04, 1971 Today's Date: 07/28/2018    History of present illness 48 y.o.malewithhistory of morbid obesity, asthma, hypertension, sleep apnea had a fall at home while walking to the bathroom. Patient slipped and fell backwards. Following which patient's knee started hurting. CAT scan done which shows bilateral quadricep tendon tear.Status bilateral repair of quadriceps tendons by Dr. Roda Shutters 07/21/2018; Bil WBAT,    OT comments  Pt continues to make progress towards therapy goals and remains motivated to work with therapy. Use of vital go tilt bed this session, pt able to progress to full standing position with use of tilt bed features, taking weight through LEs and use of UE support on RW. Tolerated upright standing position approx 5-6 min and pt tolerating entirety of session well. He is determined to return to PLOF. Feel CIR remains most appropriate recommendation at time of discharge to maximize pt's safety and independence with ADL and mobility. Will continue to follow acutely to progress pt towards established OT goals.   Follow Up Recommendations  CIR;Supervision/Assistance - 24 hour    Equipment Recommendations  Other (comment)(defer to next venue, will need bari equipment)          Precautions / Restrictions Precautions Precautions: Fall Precaution Comments: NO knee flexion Required Braces or Orthoses: Other Brace;Knee Immobilizer - Left;Knee Immobilizer - Right Knee Immobilizer - Right: On when out of bed or walking Knee Immobilizer - Left: On when out of bed or walking Other Brace: Bilateral Bledsoe braces locked in extension reinforced with Bil KI for intial standing trials.  Restrictions Weight Bearing Restrictions: Yes RLE Weight Bearing: Weight bearing as tolerated LLE Weight Bearing: Weight bearing as tolerated       Mobility Bed Mobility Overal bed mobility: Needs  Assistance Bed Mobility: Rolling Rolling: +2 for physical assistance;Min assist         General bed mobility comments: Two person min assist to roll bil pt using bed rail to help with upper trunk, assist needed to help managed legs, but pt also helping lift theses as well.    Transfers Overall transfer level: Needs assistance Equipment used: Rolling walker (2 wheeled)             General transfer comment: Pt stood at the end of his bed, fully upright, chest strap loosened to allow him to reach and hold the RW in standing.  Pt stood 5-6 mins as we progressively loosened his chest strap.      Balance           Standing balance support: Bilateral upper extremity supported Standing balance-Leahy Scale: Poor Standing balance comment: standing fully upright at end of the vital go tilt bed for 5-6 mins with chest strap loosened so that he can reach the RW.  Pt excited and asked if he can try again later this PM.                             ADL either performed or assessed with clinical judgement   ADL Overall ADL's : Needs assistance/impaired Eating/Feeding: Set up;Sitting;Bed level   Grooming: Set up;Sitting;Bed level               Lower Body Dressing: Maximal assistance;Bed level Lower Body Dressing Details (indicate cue type and reason): pt assisting to lift LEs for donning bledsoe braces, KI, etc  Vision       Perception     Praxis      Cognition Arousal/Alertness: Awake/alert Behavior During Therapy: WFL for tasks assessed/performed Overall Cognitive Status: Within Functional Limits for tasks assessed                                          Exercises     Shoulder Instructions       General Comments Pt with some early signs of pressure so positioned in parital R sidelying end of session. Benadryl cream applied to back/buttocks per pt request. Pt's LEs also re-wrapped start of session as they were  bunched/needed redressing. Bil bledsoes repositioned and use of bil KIs for standing trial today (KI removed end of session). Pt reports initial lightheadedness with standing to 55*, BP stable at 128/70s, cues to breathed and maintained at 55* until dizziness subsided    Pertinent Vitals/ Pain       Pain Assessment: Faces Faces Pain Scale: Hurts whole lot Pain Location: Bil knees,  Pain Descriptors / Indicators: Spasm;Discomfort;Sharp Pain Intervention(s): Limited activity within patient's tolerance;Monitored during session;Repositioned  Home Living                                          Prior Functioning/Environment              Frequency  Min 3X/week        Progress Toward Goals  OT Goals(current goals can now be found in the care plan section)  Progress towards OT goals: Progressing toward goals  Acute Rehab OT Goals Patient Stated Goal: to walk again OT Goal Formulation: With patient Time For Goal Achievement: 08/07/18 Potential to Achieve Goals: Good ADL Goals Pt Will Perform Grooming: with set-up;sitting Pt Will Perform Lower Body Bathing: with set-up;with adaptive equipment;sitting/lateral leans Pt Will Perform Lower Body Dressing: with mod assist;sit to/from stand Pt Will Transfer to Toilet: with min assist;with +2 assist;bedside commode;stand pivot transfer Pt Will Perform Toileting - Clothing Manipulation and hygiene: with mod assist Additional ADL Goal #1: Pt will perform bed mobility at min A +2 assist prior to engaging in ADL  Plan Discharge plan remains appropriate;Frequency remains appropriate    Co-evaluation    PT/OT/SLP Co-Evaluation/Treatment: Yes Reason for Co-Treatment: Complexity of the patient's impairments (multi-system involvement);For patient/therapist safety;To address functional/ADL transfers PT goals addressed during session: Mobility/safety with mobility;Balance;Proper use of DME;Strengthening/ROM OT goals addressed  during session: Strengthening/ROM;ADL's and self-care      AM-PAC OT "6 Clicks" Daily Activity     Outcome Measure   Help from another person eating meals?: None Help from another person taking care of personal grooming?: None Help from another person toileting, which includes using toliet, bedpan, or urinal?: A Lot Help from another person bathing (including washing, rinsing, drying)?: A Lot Help from another person to put on and taking off regular upper body clothing?: A Lot Help from another person to put on and taking off regular lower body clothing?: Total 6 Click Score: 15    End of Session Equipment Utilized During Treatment: Right knee immobilizer;Left knee immobilizer(bil bledsoe braces, bil KIs)  OT Visit Diagnosis: Unsteadiness on feet (R26.81);Other abnormalities of gait and mobility (R26.89);Pain Pain - Right/Left: (bil) Pain - part of body: Knee;Leg   Activity Tolerance Patient  tolerated treatment well   Patient Left in bed;with call bell/phone within reach   Nurse Communication Mobility status        Time: [id number] OT Time Calculation (min): 58 min  Charges: OT General Charges $OT Visit: 1 Visit OT Treatments $Therapeutic Activity: 23-37 mins  Marcy SirenBreanna Gust Eugene, OT Supplemental Rehabilitation Services Pager [phone] Office [phone]    Orlando PennerBreanna L Jahiem Franzoni 07/28/2018, 2:39 PM

## 2018-07-28 NOTE — Progress Notes (Signed)
Patient is able to place himself on CPAP when ready and will call if any further assistance needed.

## 2018-07-28 NOTE — Progress Notes (Signed)
Orthopedic Tech Progress Note Patient Details:  Larry Fowler March 17, 1971 [id number] Called in brace order Patient ID: Larry Fowler, male   DOB: [birth year], 48 y.o.   MRN: [id number]   [name] 07/28/2018, 10:27 AM

## 2018-07-29 LAB — GLUCOSE, CAPILLARY
Glucose-Capillary: 151 mg/dL — ABNORMAL HIGH (ref 70–99)
Glucose-Capillary: 147 mg/dL — ABNORMAL HIGH (ref 70–99)
Glucose-Capillary: 126 mg/dL — ABNORMAL HIGH (ref 70–99)
Glucose-Capillary: 144 mg/dL — ABNORMAL HIGH (ref 70–99)

## 2018-07-29 MED ORDER — SERTRALINE HCL 25 MG PO TABS
25.0000 mg | ORAL_TABLET | Freq: Every day | ORAL | Status: DC
  Administered 2018-07-29 – 2018-08-01 (×4): 25 mg via ORAL
  Filled 2018-07-29 (×4): qty 1

## 2018-07-29 NOTE — Plan of Care (Signed)
  Problem: Pain Managment: Goal: General experience of comfort will improve Outcome: Progressing   Problem: Safety: Goal: Ability to remain free from injury will improve Outcome: Progressing   Problem: Skin Integrity: Goal: Risk for impaired skin integrity will decrease Outcome: Progressing   

## 2018-07-29 NOTE — Progress Notes (Signed)
Chaplain responded to spiritual consult from nurse. Patient tearful and upset.  "I watched my mom die.  I prayed to God to make her better. God didn't answer my prayers. Now I'm afraid I'm gonna die like her.  She got a bedsore and died.  I'm doing everything I can to not get a bedsore like her."  Chaplain provided ministry of presence but was called to another room.  Chaplain asked patient if he would like another visit today and he said "yes."   Chaplain will follow up today when able. Lynnell Chad Pager [phone]

## 2018-07-29 NOTE — Progress Notes (Signed)
Pt noted on phone with wife. I went in to introduce myself to pt. Pt was yelling and cursing into the phone. Pt was upset that he was in the hospital and he felt like no one here wanted to take care of him. Emotional support provided. Attempted to reassure Larry Fowler, that I would take care of him today.

## 2018-07-29 NOTE — Progress Notes (Signed)
  Chaplain stopped by room 2 1/2 hours later and room was filled with family and friends.  Patient warmly greeted chaplain and called her by name but said "another time."  Chaplain will continue to follow patient. Lynnell Chad Pager [phone]

## 2018-07-29 NOTE — Plan of Care (Signed)
  Problem: Education: Goal: Knowledge of General Education information will improve Description Including pain rating scale, medication(s)/side effects and non-pharmacologic comfort measures Outcome: Progressing   Problem: Clinical Measurements: Goal: Will remain free from infection Outcome: Progressing   Problem: Activity: Goal: Risk for activity intolerance will decrease Outcome: Not Progressing

## 2018-07-29 NOTE — Progress Notes (Signed)
PROGRESS NOTE  Larry Fowler XNA:[id number] DOB: 12-19-1970 DOA: 07/20/2018 PCP: [name], MD   LOS: 9 days   Brief Narrative / Interim history: 48 y.o.malewithhistory of morbid obesity, asthma, hypertension, and sleep apnea who presented with knee pain following a fall at home. CT showed bilateral quadriceps tendon tear.On-call orthopedic surgeon Dr.Xuwas consulted, performed bilateral repair of quadriceps tendons 07/21/2018. Postoperative course complicated by blood loss anemia requiring 1u PRBCs 2/16 and constipation since resolved. PT and OT is ongoing. CIR recommended, though he will need to increase his ability to tolerate 3 hours per day.   Subjective: -Quite emotional this morning, appears distressed about the whole situation that he is in the right now.  He is tearful.  He denies any chest pain, denies any shortness of breath  Assessment & Plan: Principal Problem:   Quadriceps tendon rupture Active Problems:   Asthma   Morbid obesity (HCC)   OSA (obstructive sleep apnea)   HTN (hypertension)   Hyperglycemia   Rupture of left quadriceps muscle   Postoperative anemia due to acute blood loss   Anxiety and depression   Super-super obese (HCC)   Postoperative pain   Tobacco abuse   Principal Problem Bilateral quadricep tendon tear -Status post repair on 2/14, orthopedic surgery following -Continue pain control, physical therapy recommends CIR currently monitoring progress to see if he will tolerate the high intensity of the CIR. -Patient seems to be progressing nicely with physical therapy and CIR hopes for an admission next week   Active Problems Type 2 diabetes mellitus -A1c 7.1, is probably has significant insulin resistance due to obesity.  Placed on sliding scale insulin -Discussed extensively with the patient at bedside regarding dietary changes that he needs to make moving forward and the fact that he will not necessarily need to have insulin at home but  may be okay on metformin alone.  Best treatment for him at this point is to lose weight to help his insulin resistance -CBGs remain within parameters 140s-160s  Acute blood loss anemia -Received 1 unit of packed red blood cells on 2/16, has no bleeding, -Repeat CBC tomorrow  Hypertension -Continue home medications with losartan, blood pressure control today  Obstructive sleep apnea -Continue CPAP  History of asthma -No wheezing  Tobacco use -He will need to quit  Stage I pressure ulcer, not present on admission -Very small area on the right buttock, supportive treatment with foam padding, frequent repositioning  Depression -Patient quite depressed over the last couple of days, also upset about small pressure ulcer.  He is asking for medications to help with his depression, will start Zoloft   Scheduled Meds: . budesonide-formoterol  2 puff Inhalation BID  . enoxaparin (LOVENOX) injection  110 mg Subcutaneous Q24H  . guaiFENesin  1,200 mg Oral BID  . insulin aspart  0-5 Units Subcutaneous QHS  . insulin aspart  0-9 Units Subcutaneous TID WC  . lidocaine  1 patch Transdermal Q24H  . losartan  25 mg Oral Daily  . metFORMIN  500 mg Oral Q breakfast  . oxyCODONE  10 mg Oral Q12H  . pantoprazole  40 mg Oral Daily  . senna-docusate  3 tablet Oral BID  . sertraline  25 mg Oral Daily   Continuous Infusions: . lactated ringers 10 mL/hr at 07/21/18 1159   PRN Meds:.acetaminophen, albuterol, alum & mag hydroxide-simeth, diphenhydrAMINE, diphenhydrAMINE-zinc acetate, Glycerin (Adult), hydrALAZINE, HYDROmorphone (DILAUDID) injection, methocarbamol, metoCLOPramide **OR** metoCLOPramide (REGLAN) injection, morphine injection, ondansetron **OR** ondansetron (ZOFRAN) IV, oxyCODONE,  oxyCODONE, polyethylene glycol, sorbitol  DVT prophylaxis: Lovenox Code Status: Full code Family Communication: No family at bedside Disposition Plan: CIR when accepted  Consultants:   Orthopedic  surgery  Procedures:   2/14, repair bilateral quadriceps with Dr. Roda ShuttersXu  Antimicrobials:  None    Objective: Vitals:   07/28/18 1936 07/28/18 2034 07/29/18 0459 07/29/18 0930  BP: (!) 111/45  130/61   Pulse: 71  72 74  Resp:   18 18  Temp: 98.2 F (36.8 C)     TempSrc: Oral     SpO2: 100% 100% (!) 89%   Weight:      Height:        Intake/Output Summary (Last 24 hours) at 07/29/2018 1101 Last data filed at 07/29/2018 0900 Gross per 24 hour  Intake 720 ml  Output 1275 ml  Net -555 ml   Filed Weights   07/21/18 1153  Weight: (!) 226.8 kg    Examination:  Constitutional: No distress Eyes: No scleral icterus ENMT: Moist mucous membranes Respiratory: Clear to auscultation bilaterally, no wheezing or crackles.  Poor exam due to body habitus Cardiovascular: Regular rate and rhythm, no murmurs appreciated Abdomen: Obese, nontender, nondistended Skin: Tiny less than 1 cm pressure ulcer on the right buttock Neurologic: Moves all 4 independently, no focal deficits   Data Reviewed: I have independently reviewed following labs and imaging studies   CBC: Recent Labs  Lab 07/23/18 0500 07/23/18 2017 07/24/18 0948 07/25/18 0819 07/27/18 0545  WBC 9.6  --  8.7 8.2 7.5  HGB 7.4* 7.8* 7.8* 7.7* 7.7*  HCT 23.2* 24.7* 24.4* 23.6* 23.8*  MCV 97.9  --  95.7 95.9 96.7  PLT 123*  --  121* 143* 164   Basic Metabolic Panel: Recent Labs  Lab 07/23/18 0500 07/24/18 0948 07/27/18 0545  NA 135 136 136  K 4.3 3.9 4.0  CL 103 102 100  CO2 28 26 24   GLUCOSE 230* 242* 180*  BUN 16 10 13   CREATININE 0.72 0.64 0.69  CALCIUM 8.1* 8.2* 8.3*   GFR: Estimated Creatinine Clearance: 228.5 mL/min (by C-G formula based on SCr of 0.69 mg/dL). Liver Function Tests: No results for input(s): AST, ALT, ALKPHOS, BILITOT, PROT, ALBUMIN in the last 168 hours. No results for input(s): LIPASE, AMYLASE in the last 168 hours. No results for input(s): AMMONIA in the last 168 hours. Coagulation  Profile: No results for input(s): INR, PROTIME in the last 168 hours. Cardiac Enzymes: No results for input(s): CKTOTAL, CKMB, CKMBINDEX, TROPONINI in the last 168 hours. BNP (last 3 results) No results for input(s): PROBNP in the last 8760 hours. HbA1C: No results for input(s): HGBA1C in the last 72 hours. CBG: Recent Labs  Lab 07/28/18 0638 07/28/18 1217 07/28/18 1607 07/28/18 2107 07/29/18 0651  GLUCAP 160* 145* 160* 154* 151*   Lipid Profile: No results for input(s): CHOL, HDL, LDLCALC, TRIG, CHOLHDL, LDLDIRECT in the last 72 hours. Thyroid Function Tests: No results for input(s): TSH, T4TOTAL, FREET4, T3FREE, THYROIDAB in the last 72 hours. Anemia Panel: No results for input(s): VITAMINB12, FOLATE, FERRITIN, TIBC, IRON, RETICCTPCT in the last 72 hours. Urine analysis:    Component Value Date/Time   COLORURINE YELLOW 03/21/2017 1358   APPEARANCEUR CLEAR 03/21/2017 1358   LABSPEC 1.020 03/21/2017 1358   PHURINE 6.5 03/21/2017 1358   GLUCOSEU NEGATIVE 03/21/2017 1358   HGBUR NEGATIVE 03/21/2017 1358   BILIRUBINUR NEGATIVE 03/21/2017 1358   KETONESUR NEGATIVE 03/21/2017 1358   PROTEINUR 30 (A) 03/13/2007 2338   UROBILINOGEN  0.2 03/21/2017 1358   NITRITE NEGATIVE 03/21/2017 1358   LEUKOCYTESUR NEGATIVE 03/21/2017 1358   Sepsis Labs: Invalid input(s): PROCALCITONIN, LACTICIDVEN  No results found for this or any previous visit (from the past 240 hour(s)).   Time spent: 35 minutes, more than 50% at bedside in counseling regarding diabetes and weight loss and dietary changes  Radiology Studies: No results found.  Pamella Pert, MD, PhD [facility]  Contact via  [email or web address]  [facility] Office Info P: [phone]  F: [phone]

## 2018-07-30 DIAGNOSIS — L8990 Pressure ulcer of unspecified site, unspecified stage: Secondary | ICD-10-CM

## 2018-07-30 LAB — GLUCOSE, CAPILLARY
GLUCOSE-CAPILLARY: 126 mg/dL — AB (ref 70–99)
Glucose-Capillary: 188 mg/dL — ABNORMAL HIGH (ref 70–99)
Glucose-Capillary: 160 mg/dL — ABNORMAL HIGH (ref 70–99)
Glucose-Capillary: 117 mg/dL — ABNORMAL HIGH (ref 70–99)

## 2018-07-30 LAB — BASIC METABOLIC PANEL
Anion gap: 10 (ref 5–15)
BUN: 12 mg/dL (ref 6–20)
CO2: 24 mmol/L (ref 22–32)
Calcium: 8.3 mg/dL — ABNORMAL LOW (ref 8.9–10.3)
Chloride: 101 mmol/L (ref 98–111)
Creatinine, Ser: 0.74 mg/dL (ref 0.61–1.24)
GFR calc Af Amer: >60 mL/min (ref >60)
GFR calc non Af Amer: >60 mL/min (ref >60)
Glucose, Bld: 166 mg/dL — ABNORMAL HIGH (ref 70–99)
Potassium: 3.9 mmol/L (ref 3.5–5.1)
Sodium: 135 mmol/L (ref 135–145)

## 2018-07-30 LAB — CBC
HCT: 24.4 % — ABNORMAL LOW (ref 39.0–52.0)
Hemoglobin: 7.8 g/dL — ABNORMAL LOW (ref 13.0–17.0)
MCH: 31.1 pg (ref 26.0–34.0)
MCHC: 32 g/dL (ref 30.0–36.0)
MCV: 97.2 fL (ref 80.0–100.0)
Platelets: 180 10*3/uL (ref 150–400)
RBC: 2.51 MIL/uL — ABNORMAL LOW (ref 4.22–5.81)
RDW: 15.1 % (ref 11.5–15.5)
WBC: 7.7 10*3/uL (ref 4.0–10.5)
nRBC: 0 % (ref 0.0–0.2)

## 2018-07-30 MED ORDER — AMOXICILLIN-POT CLAVULANATE 875-125 MG PO TABS
1.0000 | ORAL_TABLET | Freq: Two times a day (BID) | ORAL | Status: DC
  Administered 2018-07-30 – 2018-08-01 (×5): 1 via ORAL
  Filled 2018-07-30 (×5): qty 1

## 2018-07-30 NOTE — Progress Notes (Signed)
In to assess pt and medicate for pain. Pt stated he washaving some pressure in his ears. He felt like there was fluid in them. MD made aware and will come assess.

## 2018-07-30 NOTE — Progress Notes (Signed)
Pt and wife re-educated on turning. Pt had many visitors today and did not turn or reposition for several hours, despite my prompting. Continue to reinforce the need to turn and offload pressure to pts buttocks.

## 2018-07-30 NOTE — Progress Notes (Signed)
PROGRESS NOTE  Larry CoveRaymond S Fowler ZOX:[id number] DOB: 07-09-1970 DOA: 07/20/2018 PCP: Larry LevinsJohn, James W, MD   LOS: 10 days   Brief Narrative / Interim history: 48 y.o.malewithhistory of morbid obesity, asthma, hypertension, and sleep apnea who presented with knee pain following a fall at home. CT showed bilateral quadriceps tendon tear.On-call orthopedic surgeon Dr.Xuwas consulted, performed bilateral repair of quadriceps tendons 07/21/2018. Postoperative course complicated by blood loss anemia requiring 1u PRBCs 2/16 and constipation since resolved. PT and OT is ongoing. CIR recommended, though he will need to increase his ability to tolerate 3 hours per day.   Subjective: -No chest pain, no shortness of breath.  No abdominal pain, no nausea or vomiting.  Complains of bilateral ear fullness for the past 24 hours and dizziness  Assessment & Plan: Principal Problem:   Quadriceps tendon rupture Active Problems:   Asthma   Morbid obesity (HCC)   OSA (obstructive sleep apnea)   HTN (hypertension)   Hyperglycemia   Rupture of left quadriceps muscle   Postoperative anemia due to acute blood loss   Anxiety and depression   Super-super obese (HCC)   Postoperative pain   Tobacco abuse   Pressure injury of skin   Principal Problem Bilateral quadricep tendon tear -Status post repair on 2/14, orthopedic surgery following -Continue pain control, physical therapy recommends CIR currently monitoring progress to see if he will tolerate the high intensity of the CIR. -Patient seems to be progressing nicely with physical therapy and CIR hopes for an admission in the next 1 to 2 days   Active Problems Type 2 diabetes mellitus -A1c 7.1, is probably has significant insulin resistance due to obesity.  Placed on sliding scale insulin -Discussed extensively with the patient at bedside regarding dietary changes that he needs to make moving forward and the fact that he will not necessarily need to have  insulin at home but may be okay on metformin alone.  Best treatment for him at this point is to lose weight to help his insulin resistance -CBGs are stable  Acute otitis media -Patient with new complaints of ear fullness, does have slight changes to his TM on otoscopic exam, he had a low-grade temp this morning of 100.1, will do a short course of Augmentin  Acute blood loss anemia -Received 1 unit of packed red blood cells on 2/16, has no bleeding, -CBC this morning shows stable hemoglobin  Hypertension -Continue home medications with losartan, blood pressure within normal parameters today  Obstructive sleep apnea -Continue CPAP  History of asthma -No wheezing  Tobacco use -He will need to quit  Stage I pressure ulcer, not present on admission -Very small area on the right buttock, supportive treatment with foam padding, frequent repositioning  Depression -Patient quite depressed over the last couple of days, also upset about small pressure ulcer.  He is asking for medications to help with his depression -Have started Zoloft but refused yesterday   Scheduled Meds: . amoxicillin-clavulanate  1 tablet Oral Q12H  . budesonide-formoterol  2 puff Inhalation BID  . enoxaparin (LOVENOX) injection  110 mg Subcutaneous Q24H  . guaiFENesin  1,200 mg Oral BID  . insulin aspart  0-5 Units Subcutaneous QHS  . insulin aspart  0-9 Units Subcutaneous TID WC  . lidocaine  1 patch Transdermal Q24H  . losartan  25 mg Oral Daily  . metFORMIN  500 mg Oral Q breakfast  . oxyCODONE  10 mg Oral Q12H  . pantoprazole  40 mg Oral Daily  .  senna-docusate  3 tablet Oral BID  . sertraline  25 mg Oral Daily   Continuous Infusions: . lactated ringers 10 mL/hr at 07/21/18 1159   PRN Meds:.acetaminophen, albuterol, alum & mag hydroxide-simeth, diphenhydrAMINE, diphenhydrAMINE-zinc acetate, Glycerin (Adult), hydrALAZINE, HYDROmorphone (DILAUDID) injection, methocarbamol, metoCLOPramide **OR**  metoCLOPramide (REGLAN) injection, morphine injection, ondansetron **OR** ondansetron (ZOFRAN) IV, oxyCODONE, oxyCODONE, polyethylene glycol, sorbitol  DVT prophylaxis: Lovenox Code Status: Full code Family Communication: No family at bedside Disposition Plan: CIR when accepted  Consultants:   Orthopedic surgery  Procedures:   2/14, repair bilateral quadriceps with Dr. Roda Shutters  Antimicrobials:  None    Objective: Vitals:   07/29/18 1515 07/29/18 1939 07/30/18 0110 07/30/18 0209  BP: (!) 137/58 (!) 115/55  (!) 103/33  Pulse: 69 78  79  Resp: 17     Temp: 99 F (37.2 C) 99.5 F (37.5 C) 100.1 F (37.8 C) 99.2 F (37.3 C)  TempSrc: Oral Oral Oral Oral  SpO2: 97% 97%  93%  Weight:      Height:        Intake/Output Summary (Last 24 hours) at 07/30/2018 1101 Last data filed at 07/30/2018 0501 Gross per 24 hour  Intake 480 ml  Output 1000 ml  Net -520 ml   Filed Weights   07/21/18 1153  Weight: (!) 226.8 kg    Examination:  Constitutional: NAD Eyes: No icterus seen ENMT: Moist mucous membranes, left ear exam shows normal canal, slightly erythematous and bulging TM, right ear exam shows normal canal, slightly erythematous and bulging TM Respiratory: Clear to auscultation bilaterally without wheezing or crackles Cardiovascular: Regular rate and rhythm, no murmurs appreciated Abdomen: Obese, nontender, nondistended Skin: Tiny less than 1 cm pressure ulcer on the right buttock Neurologic: No focal deficits   Data Reviewed: I have independently reviewed following labs and imaging studies   CBC: Recent Labs  Lab 07/23/18 2017 07/24/18 0948 07/25/18 0819 07/27/18 0545 07/30/18 0411  WBC  --  8.7 8.2 7.5 7.7  HGB 7.8* 7.8* 7.7* 7.7* 7.8*  HCT 24.7* 24.4* 23.6* 23.8* 24.4*  MCV  --  95.7 95.9 96.7 97.2  PLT  --  121* 143* 164 180   Basic Metabolic Panel: Recent Labs  Lab 07/24/18 0948 07/27/18 0545 07/30/18 0411  NA 136 136 135  K 3.9 4.0 3.9  CL 102 100  101  CO2 26 24 24   GLUCOSE 242* 180* 166*  BUN 10 13 12   CREATININE 0.64 0.69 0.74  CALCIUM 8.2* 8.3* 8.3*   GFR: Estimated Creatinine Clearance: 228.5 mL/min (by C-G formula based on SCr of 0.74 mg/dL). Liver Function Tests: No results for input(s): AST, ALT, ALKPHOS, BILITOT, PROT, ALBUMIN in the last 168 hours. No results for input(s): LIPASE, AMYLASE in the last 168 hours. No results for input(s): AMMONIA in the last 168 hours. Coagulation Profile: No results for input(s): INR, PROTIME in the last 168 hours. Cardiac Enzymes: No results for input(s): CKTOTAL, CKMB, CKMBINDEX, TROPONINI in the last 168 hours. BNP (last 3 results) No results for input(s): PROBNP in the last 8760 hours. HbA1C: No results for input(s): HGBA1C in the last 72 hours. CBG: Recent Labs  Lab 07/29/18 0651 07/29/18 1126 07/29/18 1619 07/29/18 2128 07/30/18 0632  GLUCAP 151* 147* 126* 144* 188*   Lipid Profile: No results for input(s): CHOL, HDL, LDLCALC, TRIG, CHOLHDL, LDLDIRECT in the last 72 hours. Thyroid Function Tests: No results for input(s): TSH, T4TOTAL, FREET4, T3FREE, THYROIDAB in the last 72 hours. Anemia Panel: No results for  input(s): VITAMINB12, FOLATE, FERRITIN, TIBC, IRON, RETICCTPCT in the last 72 hours. Urine analysis:    Component Value Date/Time   COLORURINE YELLOW 03/21/2017 1358   APPEARANCEUR CLEAR 03/21/2017 1358   LABSPEC 1.020 03/21/2017 1358   PHURINE 6.5 03/21/2017 1358   GLUCOSEU NEGATIVE 03/21/2017 1358   HGBUR NEGATIVE 03/21/2017 1358   BILIRUBINUR NEGATIVE 03/21/2017 1358   KETONESUR NEGATIVE 03/21/2017 1358   PROTEINUR 30 (A) 03/13/2007 2338   UROBILINOGEN 0.2 03/21/2017 1358   NITRITE NEGATIVE 03/21/2017 1358   LEUKOCYTESUR NEGATIVE 03/21/2017 1358   Sepsis Labs: Invalid input(s): PROCALCITONIN, LACTICIDVEN  No results found for this or any previous visit (from the past 240 hour(s)).   Time spent: 35 minutes, more than 50% at bedside in counseling  regarding diabetes and weight loss and dietary changes  Radiology Studies: No results found.  Pamella Pert, MD, PhD [facility]  Contact via  [email or web address]  [facility] Office Info P: [phone]  F: [phone]

## 2018-07-30 NOTE — Plan of Care (Signed)

## 2018-07-30 NOTE — Progress Notes (Signed)
Physical Therapy Treatment Patient Details Name: Larry Fowler MRN: [id number] DOB: May 05, 1971 Today's Date: 07/30/2018    History of Present Illness 48 y.o.malewithhistory of morbid obesity, asthma, hypertension, sleep apnea had a fall at home while walking to the bathroom. Patient slipped and fell backwards. Following which patient's knee started hurting. CAT scan done which shows bilateral quadricep tendon tear.Status bilateral repair of quadriceps tendons by Dr. Roda Shutters 07/21/2018; Bil WBAT,     PT Comments    Continuing work on functional mobility and activity tolerance;  Larry Fowler had been trying to take less pain meds throughout the day today -- unfortunately, his pain was not managed when we went to work on tilting to stand, and while he was able to max out the tilt of the bed, he was in too much pain, and had to lay down almost immediately;   Larry Fowler was unhappy he didn't tolerate longer in standing; I took the time to highlight the fact that 4 days ago, he could only tilt to 30 and then 47 degrees, and that was with pain meds; He is still progressing; Will plan to specifically premedicate for tomorrow's session (we are shooting for 10:30 am); We hope to stand with the bed, and work on weight shifting, pre-gait, stiff-leg march in place.   Follow Up Recommendations  CIR     Equipment Recommendations  3in1 (PT);Wheelchair (measurements PT);Wheelchair cushion (measurements PT);Rolling walker with 5" wheels;Other (comment)(all bariatric equipment)    Recommendations for Other Services       Precautions / Restrictions Precautions Precautions: Fall Precaution Comments: NO knee flexion Required Braces or Orthoses: Other Brace;Knee Immobilizer - Left;Knee Immobilizer - Right Knee Immobilizer - Right: On when out of bed or walking Knee Immobilizer - Left: On when out of bed or walking Other Brace: Bilateral Bledsoe braces locked in extension reinforced with Bil KI for intial standing  trials.  Restrictions RLE Weight Bearing: Weight bearing as tolerated LLE Weight Bearing: Weight bearing as tolerated Other Position/Activity Restrictions: No knee flexion    Mobility  Bed Mobility               General bed mobility comments: Larry Fowler does a great job using the headboard to pull self up in the bed  Transfers Overall transfer level: Needs assistance   Transfers: Sit to/from Stand Sit to Stand: Total assist         General transfer comment: Employed [employer] bed to tilt to standing; Larry Fowler was able to tolerate tilting to stand, maxed out the bed angle; very painful LEs; Cues for deep breathing/relaxation, and reminded Larry Fowler that he will not fall; Still, pain was too much, and he almost immediately had to tilt back down  Ambulation/Gait                 Stairs             Wheelchair Mobility    Modified Rankin (Stroke Patients Only)       Balance                                            Cognition Arousal/Alertness: Awake/alert Behavior During Therapy: WFL for tasks assessed/performed Overall Cognitive Status: Within Functional Limits for tasks assessed  Exercises      General Comments        Pertinent Vitals/Pain Pain Assessment: Faces Faces Pain Scale: Hurts worst Pain Location: Bil knees,  Pain Descriptors / Indicators: Spasm;Discomfort;Sharp Pain Intervention(s): Monitored during session;Limited activity within patient's tolerance    Home Living                      Prior Function            PT Goals (current goals can now be found in the care plan section) Acute Rehab PT Goals Patient Stated Goal: to walk again PT Goal Formulation: With patient Time For Goal Achievement: 08/06/18 Potential to Achieve Goals: Good Progress towards PT goals: Not progressing toward goals - comment(Limited by pain this session)    Frequency    Min  5X/week      PT Plan Current plan remains appropriate    Co-evaluation              AM-PAC PT "6 Clicks" Mobility   Outcome Measure  Help needed turning from your back to your side while in a flat bed without using bedrails?: A Little Help needed moving from lying on your back to sitting on the side of a flat bed without using bedrails?: A Lot Help needed moving to and from a bed to a chair (including a wheelchair)?: Total Help needed standing up from a chair using your arms (e.g., wheelchair or bedside chair)?: Total Help needed to walk in hospital room?: Total Help needed climbing 3-5 steps with a railing? : Total 6 Click Score: 9    End of Session Equipment Utilized During Treatment: Other (comment)(and bil bledsoes locked in extension. ) Activity Tolerance: Patient limited by pain Patient left: in bed;with call bell/phone within reach Nurse Communication: Mobility status PT Visit Diagnosis: Other abnormalities of gait and mobility (R26.89);Pain;Difficulty in walking, not elsewhere classified (R26.2) Pain - Right/Left: (bilateral) Pain - part of body: Knee     Time: 1340-1410 PT Time Calculation (min) (ACUTE ONLY): 30 min  Charges:  $Therapeutic Activity: 23-37 mins                     Van Clines, PT  Acute Rehabilitation Services Pager [phone] Office [phone]    Levi Aland 07/30/2018, 4:12 PM

## 2018-07-30 NOTE — Progress Notes (Signed)
Pt worried about pressure injuries to bottom. Educated pt on prevention and healing the areas. Stated that he could benefit from an air mattress, pt refused stating that the bed he is in is comfortable. They had an air mattress to start with, but he didn't like it. Pt was educated on tuning every two hours and he agreed to that.

## 2018-07-31 LAB — GLUCOSE, CAPILLARY
GLUCOSE-CAPILLARY: 132 mg/dL — AB (ref 70–99)
Glucose-Capillary: 136 mg/dL — ABNORMAL HIGH (ref 70–99)
Glucose-Capillary: 140 mg/dL — ABNORMAL HIGH (ref 70–99)
Glucose-Capillary: 148 mg/dL — ABNORMAL HIGH (ref 70–99)

## 2018-07-31 NOTE — Progress Notes (Signed)
  Patient has home CPAP unit at bedside. No assistance needed from RT at this time. Patient aware to let RT know if he needs anything.

## 2018-07-31 NOTE — Progress Notes (Signed)
Chaplain made third visit to patient.  Patient is still emotionally struggling. Depressed and fearful.  Chaplain learned about family. Patient's parents are both dead and he is estranged from sister, who became a Muslim convert, whereabouts unknown. Patient has loving relationship with wife, Larry Fowler.  No children.  Patient is a Research scientist (medical), and has a dog named [name] who he misses terribly.  Patient said is lifelong Catholic.  "I threw my rosary prayer beads across the room this morning. I'm so made at God." Chaplain offered to have catholic eucharistic ministers bring him communion while he is here, hoping that more, regular visits will lift his spirits. Patient said yes to receiving communion. Will continue to follow,. Lynnell Chad  Pager [phone]

## 2018-07-31 NOTE — Progress Notes (Signed)
Physical Therapy Treatment Patient Details Name: Larry Fowler MRN: [id number] DOB: 1971/01/06 Today's Date: 07/31/2018    History of Present Illness 48 y.o.malewithhistory of morbid obesity, asthma, hypertension, sleep apnea had a fall at home while walking to the bathroom. Patient slipped and fell backwards. Following which patient's knee started hurting. CAT scan done which shows bilateral quadricep tendon tear.Status bilateral repair of quadriceps tendons by Dr. Roda Shutters 07/21/2018; Bil WBAT,     PT Comments    Continuing work on functional mobility and activity tolerance;  Pt making excellent progress towards PT goals this session. Pt was able to utilize vital tilt bed to come upright for 13.5 min of standing - during which he was able to initiate pre-gait training with various amounts of UE support on Bari walker (one for each side); Able to weight shift L enough to make very small steps forward and backward R foot; more difficulty with weight shift to R, and more difficulty moving L foot;  He remains highly motivated, and was able to weight shift, pre-gait with right and LLE. Pt continues to be highly motivated, eager to work with therapy and continues to be an excellent CIR candidate.    Follow Up Recommendations  CIR     Equipment Recommendations  3in1 (PT);Wheelchair (measurements PT);Wheelchair cushion (measurements PT);Rolling walker with 5" wheels;Other (comment)(all bariatric equipment)    Recommendations for Other Services       Precautions / Restrictions Precautions Precautions: Fall Precaution Comments: NO knee flexion Required Braces or Orthoses: Other Brace;Knee Immobilizer - Left;Knee Immobilizer - Right Knee Immobilizer - Right: On when out of bed or walking Knee Immobilizer - Left: On when out of bed or walking Other Brace: Bilateral Bledsoe braces locked in extension reinforced with Bil KI for standing.  Restrictions Weight Bearing Restrictions: Yes RLE  Weight Bearing: Weight bearing as tolerated LLE Weight Bearing: Weight bearing as tolerated Other Position/Activity Restrictions: No knee flexion    Mobility  Bed Mobility               General bed mobility comments: [name] does a great job using the headboard to pull self up in the bed  Transfers Overall transfer level: Needs assistance Equipment used: Rolling walker (2 wheeled) Transfers: Sit to/from Stand Sit to Stand: Total assist         General transfer comment: Employed Vital Go bed to tilt to standing; initial break at 30 degrees, and then tilted to 75 degrees where [name] was able to tolerate standing for 13.5 min, perform weight shifting and moving legs (all 3 straps loosened). Amazing progress and motivation  Ambulation/Gait             General Gait Details: Initiated pre-gait activity, standing with Total Lift bed; straps loosened to allow for movement once [name] was comfortable standing; weight shifted to L enough to allow for very short R step backwards; difficulty with weight shift R to unweigh LLE for stepping forward or back   Optometrist    Modified Rankin (Stroke Patients Only)       Balance Overall balance assessment: Needs assistance         Standing balance support: Single extremity supported;Bilateral upper extremity supported;No upper extremity supported;During functional activity Standing balance-Leahy Scale: Poor Standing balance comment: standing fully upright at end of the vital go tilt bed for 13.5 mins, able to complete grooming tasks as well as loosen all 3  straps where he worked on completing weight shifting activities essential for pre-gait. Utilized 2 bari walkers, angled for one handle for each UE (and ajusted leg heights to accomodate foot plate of bed) for support  in standing initially due to body habitus, and difficulty getting both feet inside on RW                             Cognition Arousal/Alertness: Awake/alert Behavior During Therapy: WFL for tasks assessed/performed Overall Cognitive Status: Within Functional Limits for tasks assessed                                        Exercises      General Comments        Pertinent Vitals/Pain Pain Assessment: Faces Faces Pain Scale: Hurts even more Pain Location: Bil knees Pain Descriptors / Indicators: Discomfort;Sore;Sharp Pain Intervention(s): Monitored during session;Premedicated before session;Repositioned    Home Living                      Prior Function            PT Goals (current goals can now be found in the care plan section) Acute Rehab PT Goals Patient Stated Goal: to walk again PT Goal Formulation: With patient Time For Goal Achievement: 08/06/18 Potential to Achieve Goals: Good Progress towards PT goals: Progressing toward goals    Frequency    Min 5X/week      PT Plan Current plan remains appropriate    Co-evaluation PT/OT/SLP Co-Evaluation/Treatment: Yes Reason for Co-Treatment: For patient/therapist safety;To address functional/ADL transfers PT goals addressed during session: Mobility/safety with mobility OT goals addressed during session: ADL's and self-care;Strengthening/ROM      AM-PAC PT "6 Clicks" Mobility   Outcome Measure  Help needed turning from your back to your side while in a flat bed without using bedrails?: A Little Help needed moving from lying on your back to sitting on the side of a flat bed without using bedrails?: A Lot Help needed moving to and from a bed to a chair (including a wheelchair)?: Total Help needed standing up from a chair using your arms (e.g., wheelchair or bedside chair)?: Total Help needed to walk in hospital room?: Total Help needed climbing 3-5 steps with a railing? : Total 6 Click Score: 9    End of Session Equipment Utilized During Treatment: Other (comment)(and bil bledsoes locked in  extension, KIs) Activity Tolerance: Patient tolerated treatment well Patient left: in bed;with call bell/phone within reach Nurse Communication: Mobility status PT Visit Diagnosis: Other abnormalities of gait and mobility (R26.89);Pain;Difficulty in walking, not elsewhere classified (R26.2) Pain - Right/Left: (bilateral) Pain - part of body: Knee     Time: 2575-0518 PT Time Calculation (min) (ACUTE ONLY): 58 min  Charges:  $Therapeutic Activity: 23-37 mins                     Van Clines, PT  Acute Rehabilitation Services Pager [phone] Office [phone]    Levi Aland 07/31/2018, 1:40 PM

## 2018-07-31 NOTE — PMR Pre-admission (Signed)
PMR Admission Coordinator Pre-Admission Assessment  Patient: Larry Fowler is an 48 y.o., male MRN: [id number] DOB: Oct 06, 1970 Height: _0  (188 cm) Weight: (!) 226.8 kg  Insurance Information HMO: yes    PPO:      PCP:      IPA:      80/20:      OTHER: [employer] plan PRIMARY: [facility] [employer]      Policy#: [id number]      Subscriber: wife CM Name: Heywood Iles    Phone#: [phone]     Fax#: [phone] Pre-Cert#: [id number] approved until 3/02 when updates are due      Employer: [employer] Benefits:  Phone #: [phone]     Name: 07/31/2018 Eff. Date: 06/07/2018     Deduct: no deductible      Out of Pocket Max: $2500      Life Max: none CIR: $750 co pay per admission      SNF: 80% 120 days Outpatient: $30 co pay per visit     Co-Pay: visits per medical neccesity Home Health: 80%      Co-Pay: visits per medical neccesity DME: 80%     Co-Pay: 20% Providers: in network  SECONDARY: none     Medicaid Application Date:       Case Manager:  Disability Application Date:       Case Worker:   Emergency Facilities manager Information    Name Relation Home Work Mobile   [place] Spouse   [phone]      Current Medical History  Patient Admitting Diagnosis: Bilateral quad tendon rupture  History of Present Illness:  Swelling  : HPI: Larry Fowler is a 48 year old right handed male with history of OSA-CPAP,tobacco abuse, anxiety/depression, asthma, super morbid obesity with BMI 64.  Presented 07/20/2018 after slipping and falling backwards with acute onset of bilateral knee pain. CT of the knees done revealing bilateral quad tendon rupture and underwent repair on 07/21/2018 by Dr.Xu. Postoperative weightbearing as tolerated with bilateral Bledsoe knee brace is locked in extension. Ongoing issues of pain management currently maintained on scheduled OxyContin as well as oxycodone immediate release for breakthrough pain. Acute blood loss anemia 7.8 was  transfused 1 unit packed red blood cells with latest hemoglobin maintaining stable at 7.8.patient had been on subcutaneous .Currently maintained on Lovenox for DVT prophylaxis. Findings of elevated hemoglobin A1c of 7.1 and blood sugars monitored. Placed on Augmentin after patient spiked low-grade fevers findings of acute otitis media.   Past Medical History  Past Medical History:  Diagnosis Date  . ALLERGIC RHINITIS 09/12/2007   Qualifier: Diagnosis of  By: Jenny Reichmann MD, Hunt Oris   . ANXIETY 02/16/2007   Qualifier: Diagnosis of  By: Reatha Armour, Lucy    . ASTHMA 12/17/2009   Qualifier: Diagnosis of  By: Jenny Reichmann MD, Hunt Oris   . Cannabis abuse    currently  . Chills with fever   . DEPRESSION 09/12/2007   Qualifier: Diagnosis of  By: Jenny Reichmann MD, Hunt Oris   . History of cocaine use    in his 64's  . Hyperglycemia   . Rectal fistula 2009  . Rectal pain   . Renal stone    2008    Family History   family history includes Diabetes type II in his mother; Pneumonia in his father.  Prior Rehab/Hospitalizations Has the patient had major surgery during 100 days prior to admission? No    Current Medications  Current Facility-Administered Medications:  .  acetaminophen (  TYLENOL) tablet 325-650 mg, 325-650 mg, Oral, Q6H PRN, Leandrew Koyanagi, MD, 650 mg at 07/30/18 0116 .  albuterol (PROVENTIL) (2.5 MG/3ML) 0.083% nebulizer solution 2.5 mg, 2.5 mg, Nebulization, Q4H PRN, Elgergawy, Silver Huguenin, MD, 2.5 mg at 07/24/18 2130 .  alum & mag hydroxide-simeth (MAALOX/MYLANTA) 200-200-20 MG/5ML suspension 15 mL, 15 mL, Oral, Q4H PRN, Elgergawy, Silver Huguenin, MD .  amoxicillin-clavulanate (AUGMENTIN) 875-125 MG per tablet 1 tablet, 1 tablet, Oral, Q12H, Caren Griffins, MD, 1 tablet at 08/01/18 0849 .  budesonide-formoterol (SYMBICORT) 80-4.5 MCG/ACT inhaler 2 puff, 2 puff, Inhalation, BID, Elgergawy, Silver Huguenin, MD, 2 puff at 08/01/18 0855 .  diphenhydrAMINE (BENADRYL) 12.5 MG/5ML elixir 25 mg, 25 mg, Oral, Q4H PRN, Leandrew Koyanagi, MD, 25 mg at 07/31/18 2310 .  diphenhydrAMINE-zinc acetate (BENADRYL) 2-0.1 % cream, , Topical, TID PRN, Cruzita Lederer, Costin M, MD .  enoxaparin (LOVENOX) injection 110 mg, 110 mg, Subcutaneous, Q24H, Vance Gather B, MD, 110 mg at 07/31/18 1516 .  Glycerin (Adult) 2.1 g suppository 1 suppository, 1 suppository, Rectal, Daily PRN, Caren Griffins, MD .  guaiFENesin (MUCINEX) 12 hr tablet 1,200 mg, 1,200 mg, Oral, BID, Elgergawy, Silver Huguenin, MD, 1,200 mg at 07/31/18 1108 .  hydrALAZINE (APRESOLINE) injection 10 mg, 10 mg, Intravenous, Q4H PRN, Leandrew Koyanagi, MD .  HYDROmorphone (DILAUDID) injection 0.5-1 mg, 0.5-1 mg, Intravenous, Q4H PRN, Leandrew Koyanagi, MD, 1 mg at 07/26/18 1522 .  insulin aspart (novoLOG) injection 0-5 Units, 0-5 Units, Subcutaneous, QHS, Patrecia Pour, MD, 2 Units at 07/25/18 2317 .  insulin aspart (novoLOG) injection 0-9 Units, 0-9 Units, Subcutaneous, TID WC, Patrecia Pour, MD, 1 Units at 08/01/18 [phone] .  lactated ringers infusion, , Intravenous, Continuous, Leandrew Koyanagi, MD, Last Rate: 10 mL/hr at 07/21/18 1159 .  lidocaine (LIDODERM) 5 % 1 patch, 1 patch, Transdermal, Q24H, Caren Griffins, MD, 1 patch at 08/01/18 1233 .  losartan (COZAAR) tablet 25 mg, 25 mg, Oral, Daily, Patrecia Pour, MD, 25 mg at 08/01/18 0848 .  metFORMIN (GLUCOPHAGE) tablet 500 mg, 500 mg, Oral, Q breakfast, Patrecia Pour, MD, 500 mg at 08/01/18 0850 .  methocarbamol (ROBAXIN) tablet 750 mg, 750 mg, Oral, Q8H PRN, Leandrew Koyanagi, MD, 750 mg at 08/01/18 1660 .  metoCLOPramide (REGLAN) tablet 5-10 mg, 5-10 mg, Oral, Q8H PRN **OR** metoCLOPramide (REGLAN) injection 5-10 mg, 5-10 mg, Intravenous, Q8H PRN, Leandrew Koyanagi, MD .  morphine 2 MG/ML injection 0.5 mg, 0.5 mg, Intravenous, Q2H PRN, Leandrew Koyanagi, MD, 0.5 mg at 07/21/18 0830 .  naphazoline-pheniramine (NAPHCON-A) 0.025-0.3 % ophthalmic solution 1 drop, 1 drop, Both Eyes, QID PRN, Gherghe, Costin M, MD .  ondansetron (ZOFRAN) tablet 4 mg, 4 mg, Oral,  Q6H PRN **OR** ondansetron (ZOFRAN) injection 4 mg, 4 mg, Intravenous, Q6H PRN, Leandrew Koyanagi, MD .  oxyCODONE (Oxy IR/ROXICODONE) immediate release tablet 10-15 mg, 10-15 mg, Oral, Q4H PRN, Caren Griffins, MD, 15 mg at 08/01/18 6301 .  oxyCODONE (Oxy IR/ROXICODONE) immediate release tablet 15-20 mg, 15-20 mg, Oral, Q4H PRN, Caren Griffins, MD, 20 mg at 07/30/18 1410 .  oxyCODONE (OXYCONTIN) 12 hr tablet 10 mg, 10 mg, Oral, Q12H, Leandrew Koyanagi, MD, 10 mg at 08/01/18 0856 .  pantoprazole (PROTONIX) EC tablet 40 mg, 40 mg, Oral, Daily, Elgergawy, Silver Huguenin, MD, 40 mg at 08/01/18 0850 .  polyethylene glycol (MIRALAX / GLYCOLAX) packet 17 g, 17 g, Oral, Daily PRN, Leandrew Koyanagi, MD, 17 g  at 07/30/18 0802 .  senna-docusate (Senokot-S) tablet 3 tablet, 3 tablet, Oral, BID, Elgergawy, Silver Huguenin, MD, 3 tablet at 08/01/18 0848 .  sertraline (ZOLOFT) tablet 25 mg, 25 mg, Oral, Daily, Caren Griffins, MD, 25 mg at 08/01/18 0850 .  sorbitol 70 % solution 30 mL, 30 mL, Oral, Daily PRN, Leandrew Koyanagi, MD  Patients Current Diet:   Diet Order            Diet Carb Modified Fluid consistency: Thin; Room service appropriate? Yes  Diet effective now              Precautions / Restrictions Precautions Precautions: Fall Precaution Comments: NO knee flexion Other Brace: Bilateral Bledsoe braces locked in extension reinforced with Bil KI for standing.  Restrictions Weight Bearing Restrictions: Yes RLE Weight Bearing: Weight bearing as tolerated LLE Weight Bearing: Weight bearing as tolerated Other Position/Activity Restrictions: No knee flexion   Has the patient had 2 or more falls or a fall with injury in the past year?No  Prior Activity Level Community (5-7x/wk): independent without AD pta  Prior Functional Level Do you want Prior Function Level of Independence: Independent Comments: owns his own business, He is a Air traffic controller; LOTS of experience as a Engineer, site from other? Self  Care: Did the patient need help bathing, dressing, using the toilet or eating?  Independent  Indoor Mobility: Did the patient need assistance with walking from room to room (with or without device)? Independent  Stairs: Did the patient need assistance with internal or external stairs (with or without device)? Independent  Functional Cognition: Did the patient need help planning regular tasks such as shopping or remembering to take medications? Independent  Home Assistive Devices / Equipment Home Assistive Devices/Equipment: CPAP Home Equipment: None  Prior Device Use: Indicate devices/aids used by the patient prior to current illness, exacerbation or injury? None of the above  Prior Functional Level Comments: owns his own business, He is a Air traffic controller; LOTS of experience as a Engineer, site   Prior Functional Level Current Functional Level  Bed Mobility  independent  utilize vital tilt bed  Transfers independent Came upright for 13.5 minutes of standing and able to initiate pre gait training with various amount of UE support on bari walker one for each side. Able to weight shift enough to make very small steps forward and backward right foot; more difficulty moving left foot.   Mobility - Walk/Wheelchair independent   no steps yet  Mobility - Ambulation/Gait independent    Upper Body Dressing independent Minimal assistance, Sitting  Lower Body Dressing independent Total assistance, Bed level  Grooming independent Set up, Wash/dry face, Standing  Eating/Drinking independent Set up, Sitting, Bed level  Toilet Transfer independent Maximal assistance, +2 for physical assistance, +2 for safety/equipment, Requires wide/bariatric  Bladder Continence continent     Bowel Management  continent   continent uses bedpan  Stair Climbing independent   not attempted  Communication independent No difficulties  Memory intact   intact  Cooking/Meal Prep  independent     Housework   independent   Money Management  independent   Driving  yes     Special needs/care consideration BiPAP/CPAP yes pta CPM n/a Continuous Drip IV n/a Dialysis n/a Life Vest n/a Oxygen n/a Special Bed bariatric bed Trach Size n/a Wound Vac n/a Skin bilateral surgical incisions; ace wraps and bilateral Bledsoe braces locked in extension; 2/22 noted stage 2 area to buttocks, excoriated; 2/22 noted stage 2 to left  posterior thigh; moisture associated skin damage to flank and groin Bowel mgmt: continent LBM  2/25 Bladder mgmt: urinal Diabetic mgmt Hgb A1c 7.1 New dx  Previous Home Environment Living Arrangements: Spouse/significant other  Lives With: Spouse Available Help at Discharge: (wife works days at [employer] as Occupational psychologist) Type of Home: [facility] Layout: One level Home Access: Stairs to enter Entrance Stairs-Rails: None Technical brewer of Steps: 3 Bathroom Shower/Tub: Chiropodist: [place]: No  Discharge Living Setting Plans for Discharge Living Setting: Patient's home, Lives with (comment)(wife) Type of Home at Discharge: House Discharge Home Layout: One level Discharge Home Access: Stairs to enter Entrance Stairs-Rails: None Entrance Stairs-Number of Steps: 3 Discharge Bathroom Shower/Tub: Tub/shower unit Discharge Bathroom Toilet: Standard Does the patient have any problems obtaining your medications?: No  Social/Family/Support Systems Patient Roles: 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 married for 4 years Anticipated Caregiver: wife Anticipated Ambulance person Information: see above Ability/Limitations of Caregiver: wife works days Caregiver Availability: Intermittent Discharge Plan Discussed with Primary Caregiver: Yes Is Caregiver In Agreement with Plan?: Yes Does Caregiver/Family have Issues with Lodging/Transportation while Pt is in Rehab?: No  Goals/Additional  Needs Patient/Family Goal for Rehab: supervision to min asist with PT and OT Expected length of stay: ELOS 16 to 20 days Equipment Needs: Bariatric equipment needed Special Service Needs: Patietn needs alot of encouragement; depressed with his overall situation Pt/Family Agrees to Admission and willing to participate: Yes Program Orientation Provided & Reviewed with Pt/Caregiver Including Roles  & Responsibilities: Yes  Barriers to Discharge: Home environment access/layout, Weight   I have spoken to both patient and his wife about the need for ramp to be built ASAP  Patient Condition: I have reviewed medical records from [facility], spoken with patient and his spouse. I met with patient at the bedside for the rehab assessment.  Please refer to consult completed by Dr. Delice Lesch on 07/24/2018 in which patient felt to benefit from an inpatient acute rehab admission.  Patient will benefit from ongoing PT and OT, can actively participate in 3 hours of therapy a day 5 days of the week, and can make measurable gains during the admission.  Patient will also benefit from the coordinated team approach during an Inpatient Acute Rehabilitation admission.  The patient will receive intensive therapy as well as Rehabilitation physician, nursing, social worker, and care management interventions.  Due to bowel management, bladder management, safety, skin/wound care, disease management, medical administration, pain management, patient education the patient requires 24 hour a day rehabilitation nursing.  The patient is currently max assist with mobility and basic ADLs.  Discharge setting and therapy post discharge at  home with home health is anticipated.  Patient has agreed to participate in the Acute Inpatient Rehabilitation Program and will admit today.  Preadmission Screen Completed By:  Cleatrice Burke RN MSN, 08/01/2018 12:58  PM ______________________________________________________________________   Discussed status with Dr. Posey Pronto on 08/01/2018 at 1257 and received telephone approval for admission today.  Admission Coordinator:  Cleatrice Burke RN MSN time  1257 Date  08/01/2018   Cleatrice Burke RN MSN Admissions Coordinator 08/01/2018  Delice Lesch, MD, ABPMR

## 2018-07-31 NOTE — Progress Notes (Signed)
Occupational Therapy Treatment Patient Details Name: Larry Fowler MRN: [id number] DOB: 08-03-1970 Today's Date: 07/31/2018    History of present illness 48 y.o.malewithhistory of morbid obesity, asthma, hypertension, sleep apnea had a fall at home while walking to the bathroom. Patient slipped and fell backwards. Following which patient's knee started hurting. CAT scan done which shows bilateral quadricep tendon tear.Status bilateral repair of quadriceps tendons by Dr. Roda Shutters 07/21/2018; Bil WBAT,    OT comments  Pt making excellent progress towards OT goals this session. Pt was able to utilize vital tilt bed to come upright for 13.5 min of standing - during which he was able to complete grooming tasks with various amounts of UE support on Bari walker (one for each side) He remains highly motivated, and was able to weight shift, pre-gait with right and LLE. Pt continues to be highly motivated, eager to work with therapy and continues to be an excellent CIR candidate.    Follow Up Recommendations  CIR;Supervision/Assistance - 24 hour    Equipment Recommendations  Other (comment)(defer to next venue, will require bari equipment)    Recommendations for Other Services      Precautions / Restrictions Precautions Precautions: Fall Precaution Comments: NO knee flexion Required Braces or Orthoses: Other Brace;Knee Immobilizer - Left;Knee Immobilizer - Right Knee Immobilizer - Right: On when out of bed or walking Knee Immobilizer - Left: On when out of bed or walking Other Brace: Bilateral Bledsoe braces locked in extension reinforced with Bil KI for standing.  Restrictions Weight Bearing Restrictions: Yes RLE Weight Bearing: Weight bearing as tolerated LLE Weight Bearing: Weight bearing as tolerated Other Position/Activity Restrictions: No knee flexion       Mobility Bed Mobility               General bed mobility comments: [name] does a great job using the headboard to pull  self up in the bed  Transfers Overall transfer level: Needs assistance   Transfers: Sit to/from Stand Sit to Stand: Total assist         General transfer comment: Employed Vital Go bed to tilt to standing; initial break at 30 degrees, and then tilted to 75 degrees where [name] was able to tolerate standing for 13.5 min, perform weight shifting and moving legs (all 3 straps loosened). Amazing progress and motivation    Balance Overall balance assessment: Needs assistance         Standing balance support: Single extremity supported;Bilateral upper extremity supported;No upper extremity supported;During functional activity Standing balance-Leahy Scale: Poor Standing balance comment: standing fully upright at end of the vital go tilt bed for 13.5 mins, able to complete grooming tasks as well as loosen all 3 straps where he worked on completing weight shifting activities essential for pre-gait. Utilized 2 bari walkers for support  in standing initially due to body habitus                           ADL either performed or assessed with clinical judgement   ADL Overall ADL's : Needs assistance/impaired Eating/Feeding: Set up;Sitting;Bed level   Grooming: Set up;Wash/dry face;Standing Grooming Details (indicate cue type and reason): on tilt bed, no UE support Upper Body Bathing: Moderate assistance Upper Body Bathing Details (indicate cue type and reason): for back         Lower Body Dressing: Total assistance;Bed level Lower Body Dressing Details (indicate cue type and reason): to don shoes  General ADL Comments: progressing towards tolerating standing for grooming tasks     Vision       Perception     Praxis      Cognition Arousal/Alertness: Awake/alert Behavior During Therapy: WFL for tasks assessed/performed Overall Cognitive Status: Within Functional Limits for tasks assessed                                           Exercises     Shoulder Instructions       General Comments      Pertinent Vitals/ Pain       Pain Assessment: Faces Faces Pain Scale: Hurts even more Pain Location: Bil knees Pain Descriptors / Indicators: Discomfort;Sore;Sharp Pain Intervention(s): Monitored during session;Repositioned;Premedicated before session;RN gave pain meds during session  Home Living                                          Prior Functioning/Environment              Frequency  Min 3X/week        Progress Toward Goals  OT Goals(current goals can now be found in the care plan section)  Progress towards OT goals: Progressing toward goals  Acute Rehab OT Goals Patient Stated Goal: to walk again OT Goal Formulation: With patient Time For Goal Achievement: 08/07/18 Potential to Achieve Goals: Good  Plan Discharge plan remains appropriate;Frequency remains appropriate    Co-evaluation    PT/OT/SLP Co-Evaluation/Treatment: Yes Reason for Co-Treatment: For patient/therapist safety;To address functional/ADL transfers PT goals addressed during session: Mobility/safety with mobility;Balance;Strengthening/ROM OT goals addressed during session: ADL's and self-care;Strengthening/ROM      AM-PAC OT "6 Clicks" Daily Activity     Outcome Measure   Help from another person eating meals?: None Help from another person taking care of personal grooming?: None Help from another person toileting, which includes using toliet, bedpan, or urinal?: A Lot Help from another person bathing (including washing, rinsing, drying)?: A Lot Help from another person to put on and taking off regular upper body clothing?: A Lot Help from another person to put on and taking off regular lower body clothing?: Total 6 Click Score: 15    End of Session Equipment Utilized During Treatment: Right knee immobilizer;Left knee immobilizer;Other (comment)(Bilateral bledsoe braces in extension)  OT Visit  Diagnosis: Unsteadiness on feet (R26.81);Other abnormalities of gait and mobility (R26.89);Pain Pain - Right/Left: (bilateral) Pain - part of body: Knee;Leg   Activity Tolerance Patient tolerated treatment well   Patient Left in bed;with call bell/phone within reach   Nurse Communication Mobility status        Time: 5945-8592 OT Time Calculation (min): 60 min  Charges: OT General Charges $OT Visit: 1 Visit OT Treatments $Self Care/Home Management : 8-22 mins $Therapeutic Activity: 8-22 mins  Sherryl Manges OTR/L Acute Rehabilitation Services Pager: [phone] Office: [phone]   Evern Bio Ceciley Buist 07/31/2018, 11:57 AM

## 2018-07-31 NOTE — Progress Notes (Signed)
Inpatient Rehabilitation Admissions Coordinator  I have begun insurance approval for a possible CIR admit pending their approval. I met with patient at bedside and he is aware.  Danne Baxter, RN, MSN Rehab Admissions Coordinator [phone] 07/31/2018 8:44 PM

## 2018-07-31 NOTE — H&P (Signed)
Physical Medicine and Rehabilitation Admission H&P    Chief Complaint  Patient presents with  . Joint Swelling  : HPI: Larry Fowler is a 48 year old right handed male with history of OSA-CPAP,tobacco abuse, anxiety/depression, asthma, super morbid obesity with BMI 64. Per report and patient, patient lives with spouse. 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 One level home with 2 steps to entry. Presented 07/20/2018 after slipping and falling backwards with acute onset of bilateral knee pain. CT of the knees done revealing bilateral quad tendon rupture and underwent repair on 07/21/2018 by Dr.Xu. Postoperative weightbearing as tolerated with bilateral Bledsoe knee braces is locked in extension. Ongoing issues of pain management currently maintained on scheduled OxyContin as well as oxycodone immediate release for breakthrough pain. Acute blood loss anemia 7.8 was transfused 1 unit packed red blood cells with latest hemoglobin maintaining stable at 7.8.patient had been on subcutaneous .Currently maintained on Lovenox for DVT prophylaxis. Findings of elevated hemoglobin A1c of 7.1 and blood sugars monitored. Placed on Augmentin after patient spiked low-grade fevers findings of acute otitis media.Therapy evaluations completed with recommendations of physical medicine rehabilitation consult. Patient was admitted for a comprehensive rehabilitation program.  Review of Systems  Constitutional: Negative for chills and fever.  HENT: Negative for hearing loss.   Eyes: Negative for blurred vision and double vision.  Respiratory: Negative for cough.        Shortness of breath with exertion  Cardiovascular: Positive for leg swelling. Negative for chest pain.  Gastrointestinal: Positive for constipation. Negative for nausea and vomiting.  Genitourinary: Negative for dysuria, flank pain and hematuria.  Musculoskeletal: Positive for joint pain and myalgias.  Skin: Negative for rash.    Neurological: Positive for weakness.  Psychiatric/Behavioral: Positive for depression.       Anxiety  All other systems reviewed and are negative.  Past Medical History:  Diagnosis Date  . ALLERGIC RHINITIS 09/12/2007   Qualifier: Diagnosis of  By: Jonny Ruiz MD, Len Blalock   . ANXIETY 02/16/2007   Qualifier: Diagnosis of  By: Tyrone Apple, Lucy    . ASTHMA 12/17/2009   Qualifier: Diagnosis of  By: Jonny Ruiz MD, Len Blalock   . Cannabis abuse    currently  . Chills with fever   . DEPRESSION 09/12/2007   Qualifier: Diagnosis of  By: Jonny Ruiz MD, Len Blalock   . History of cocaine use    in his 66's  . Hyperglycemia   . Rectal fistula 2009  . Rectal pain   . Renal stone    2008   Past Surgical History:  Procedure Laterality Date  . FINGER SURGERY  2008/09?   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 knee surgury     ? which knee per Dr Thurston Hole in his teens  . REPAIR QUADRICEPS/HAMSTRING MUSCLES Bilateral 07/21/2018   Procedure: REPAIR BILATERAL QUADRICEPS;  Surgeon: Tarry Kos, MD;  Location: [facility] OR;  Service: Orthopedics;  Laterality: Bilateral;  . TONSILLECTOMY     Family History  Problem Relation Age of Onset  . Diabetes type II Mother   . Pneumonia Father    Social History:  reports that he has been smoking. He has been smoking about 0.25 packs per day. He has never used smokeless tobacco. He reports current alcohol use. He reports current drug use. Drug: Marijuana. Allergies: No Known Allergies Medications Prior to Admission  Medication Sig Dispense Refill  . albuterol (VENTOLIN  HFA) 108 (90 Base) MCG/ACT inhaler Inhale 2 puffs into the lungs every 6 (six) hours as needed for wheezing. 1 Inhaler 11  . budesonide-formoterol (SYMBICORT) 80-4.5 MCG/ACT inhaler Inhale 2 puffs into the lungs 2 (two) times daily. 1 Inhaler 6  . Diclofenac Sodium 2 % SOLN Place 2 g onto the skin 2 (two) times daily. 112 g 3  . losartan (COZAAR) 25 MG tablet Take 1  tablet (25 mg total) by mouth 2 (two) times daily. 180 tablet 3  . naproxen (NAPROSYN) 500 MG tablet TAKE 1 TABLET BY MOUTH TWICE DAILY WITH A MEAL (Patient taking differently: Take 500 mg by mouth 2 (two) times daily with a meal. ) 100 tablet 1    Drug Regimen Review  Drug regimen was reviewed and remains appropriate with no significant issues identified  Home: Home Living Family/patient expects to be discharged to:: Private residence Living Arrangements: Spouse/significant other Available Help at Discharge: (wife works days at [employer] as Associate Professor) Type of Home: [employer] Home Access: Stairs to enter Secretary/administrator of Steps: 3 Entrance Stairs-Rails: None Home Layout: One level Bathroom Shower/Tub: Engineer, manufacturing systems: Standard Home Equipment: None  Lives With: Spouse   Functional History: Prior Function Level of Independence: Independent Comments: owns his own business, He is a Research scientist (medical); 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 Status:  Mobility: Bed Mobility Overal bed mobility: Needs Assistance Bed Mobility: Rolling Rolling: +2 for physical assistance, Min assist Supine to sit: +2 for physical assistance, Mod assist Sit to supine: Mod assist, +2 for physical assistance(+3) General bed mobility comments: [name] does a great job using the headboard to pull self up in the bed Transfers Overall transfer level: Needs assistance Equipment used: Rolling walker (2 wheeled) Transfer via Lift Equipment: (Vital go total lift bed) Transfers: Sit to/from Stand Sit to Stand: Total assist Anterior-Posterior transfers: +2 physical assistance, Max assist, +2 safety/equipment(and 2 others supporting LEs) General transfer comment: Employed Vital Go bed to tilt to standing; initial break at 30 degrees, and then tilted to 75 degrees where Larry Fowler was able to tolerate standing for 13.5 min, perform weight shifting and moving legs (all 3 straps loosened).  Amazing progress and motivation Ambulation/Gait General Gait Details: Initiated pre-gait activity, standing with Total Lift bed; straps loosened to allow for movement once Larry Fowler was comfortable standing; weight shifted to L enough to allow for very short R step backwards; difficulty with weight shift R to unweigh LLE for stepping forward or back    ADL: ADL Overall ADL's : Needs assistance/impaired Eating/Feeding: Set up, Sitting, Bed level Grooming: Set up, Wash/dry face, Standing Grooming Details (indicate cue type and reason): on tilt bed, no UE support Upper Body Bathing: Moderate assistance Upper Body Bathing Details (indicate cue type and reason): for back Lower Body Bathing: Maximal assistance, Bed level Upper Body Dressing : Minimal assistance, Sitting Lower Body Dressing: Total assistance, Bed level Lower Body Dressing Details (indicate cue type and reason): to don shoes Toilet Transfer: Maximal assistance, +2 for physical assistance, +2 for safety/equipment, Requires wide/bariatric Toilet Transfer Details (indicate cue type and reason): continue bed pan for now Toileting- Clothing Manipulation and Hygiene: Total assistance, Bed level Toileting - Clothing Manipulation Details (indicate cue type and reason): Pt assists with rolling as able General ADL Comments: progressing towards tolerating standing for grooming tasks  Cognition: Cognition Overall Cognitive Status: Within Functional Limits for tasks assessed Orientation Level: Oriented X4 Cognition Arousal/Alertness: Awake/alert Behavior During Therapy: WFL for tasks assessed/performed  Overall Cognitive Status: Within Functional Limits for tasks assessed  Physical Exam: Blood pressure (!) 117/56, pulse 64, temperature 98.4 F (36.9 C), temperature source Oral, resp. rate 16, height 6\' 2"  (1.88 m), weight (!) 226.8 kg, SpO2 100 %. Physical Exam  Vitals reviewed. Constitutional: He is oriented to person, place, and time.  He appears well-developed.  48 year old right-handed morbidly obese male  HENT:  Head: Normocephalic and atraumatic.  Eyes: EOM are normal. Right eye exhibits no discharge. Left eye exhibits no discharge.  Neck: Normal range of motion. Neck supple. No thyromegaly present.  Cardiovascular: Normal rate and regular rhythm.  Respiratory: Effort normal and breath sounds normal.  GI: Soft. Bowel sounds are normal. He exhibits no distension.  Musculoskeletal:     Comments: LE edema and tenderness  Neurological: He is alert and oriented to person, place, and time.  Motor: Bilateral upper extremities: 5/5 proximal to distal Bilateral lower extremities: Flexion 2/5, knee extension locked in brace, ankle dorsiflexion 5/5  Skin:  Bilateral Bledsoe braces in place with dressing C/D/I  Psychiatric: He has a normal mood and affect. His behavior is normal.    Results for orders placed or performed during the hospital encounter of 07/20/18 (from the past 48 hour(s))  Glucose, capillary     Status: Abnormal   Collection Time: 07/30/18  5:04 PM  Result Value Ref Range   Glucose-Capillary 126 (H) 70 - 99 mg/dL  Glucose, capillary     Status: Abnormal   Collection Time: 07/30/18  9:11 PM  Result Value Ref Range   Glucose-Capillary 117 (H) 70 - 99 mg/dL  Glucose, capillary     Status: Abnormal   Collection Time: 07/31/18  7:34 AM  Result Value Ref Range   Glucose-Capillary 136 (H) 70 - 99 mg/dL  Glucose, capillary     Status: Abnormal   Collection Time: 07/31/18 12:58 PM  Result Value Ref Range   Glucose-Capillary 140 (H) 70 - 99 mg/dL  Glucose, capillary     Status: Abnormal   Collection Time: 07/31/18  4:53 PM  Result Value Ref Range   Glucose-Capillary 132 (H) 70 - 99 mg/dL  Glucose, capillary     Status: Abnormal   Collection Time: 07/31/18  9:13 PM  Result Value Ref Range   Glucose-Capillary 148 (H) 70 - 99 mg/dL  Glucose, capillary     Status: Abnormal   Collection Time: 08/01/18  6:37  AM  Result Value Ref Range   Glucose-Capillary 143 (H) 70 - 99 mg/dL  Glucose, capillary     Status: Abnormal   Collection Time: 08/01/18 11:34 AM  Result Value Ref Range   Glucose-Capillary 136 (H) 70 - 99 mg/dL   No results found.     Medical Problem List and Plan: 1.  Decreased functional mobility secondary to bilateral quadricep tendon rupture. Status post repair of bilateral tendon rupture repair 07/21/2018. Weightbearing as tolerated. Bilateral Bledsoe brace locked in extension  Admitted to CIR 2.  Antithrombotics: -DVT/anticoagulation:  Subcutaneous Lovenox 110 mg daily. Check lower extremity vascular study  -antiplatelet therapy: Not applicable 3. Pain Management:  Lidoderm patch,OxyContin sustained release 10 mg every 12 hours, oxycodone immediate release for breakthrough pain 4. Mood:  Zoloft 25 mg daily. Provide emotional support  -antipsychotic agents:  Not applicable 5. Neuropsych: This patient is capable of making decisions on his own behalf. 6. Skin/Wound Care: Stage I pressure ulcer. Follow-up wound care nurse Routine skin checks 7. Fluids/Electrolytes/Nutrition:  Routine interval mouth with follow-up chemistries in a.m.  8. Acute blood loss anemia. Transfused 1 unit packed red blood cells postoperatively. Follow-up CBC in a.m. 9.  Super super obesity. Follow-up dietary services 10. New findingsType 2 diabetes mellitus. Hemoglobin A1c 7.1. Glucophage 500 mg daily Check blood sugars before meals and at bedtime. Diabetic teaching 11. Acute otitis media. Augmentin 875-125 mg initiated 07/30/2018. 12. Constipation. Laxative assistance 13. Asthma/OSA/tobacco/ marijuana abuse. CPAP as directed.Continue Symbicort as well as nebulizer treatments. Provide counseling regards to tobacco abuse  Lynnae Prude 08/01/2018  Maryla Morrow, MD, ABPMR

## 2018-07-31 NOTE — Plan of Care (Signed)

## 2018-07-31 NOTE — Plan of Care (Signed)
  Problem: Pain Managment: Goal: General experience of comfort will improve Outcome: Progressing   Problem: Safety: Goal: Ability to remain free from injury will improve Outcome: Progressing   Problem: Skin Integrity: Goal: Risk for impaired skin integrity will decrease Outcome: Progressing   

## 2018-07-31 NOTE — Progress Notes (Signed)
PROGRESS NOTE  Larry Fowler JGO:[id number] DOB: July 10, 1970 DOA: 07/20/2018 PCP: [name], MD   LOS: 11 days   Brief Narrative / Interim history: 48 y.o.malewithhistory of morbid obesity, asthma, hypertension, and sleep apnea who presented with knee pain following a fall at home. CT showed bilateral quadriceps tendon tear.On-call orthopedic surgeon Dr.Xuwas consulted, performed bilateral repair of quadriceps tendons 07/21/2018. Postoperative course complicated by blood loss anemia requiring 1u PRBCs 2/16 and constipation since resolved. PT and OT is ongoing. CIR recommended, though he will need to increase his ability to tolerate 3 hours per day.   Subjective: -feels depressed this morning, he really wants to get more therapy that he is currently getting in the acute care inpatient setting  Assessment & Plan: Principal Problem:   Quadriceps tendon rupture Active Problems:   Asthma   Morbid obesity (HCC)   OSA (obstructive sleep apnea)   HTN (hypertension)   Hyperglycemia   Rupture of left quadriceps muscle   Postoperative anemia due to acute blood loss   Anxiety and depression   Super-super obese (HCC)   Postoperative pain   Tobacco abuse   Pressure injury of skin   Principal Problem Bilateral quadricep tendon tear -Status post repair on 2/14, orthopedic surgery following -Continue pain control, physical therapy recommends CIR currently monitoring progress to see if he will tolerate the high intensity of the CIR. -Medically stable for CIR transfer   Active Problems Type 2 diabetes mellitus -A1c 7.1, is probably has significant insulin resistance due to obesity.  Placed on sliding scale insulin -Discussed extensively with the patient at bedside regarding dietary changes that he needs to make moving forward and the fact that he will not necessarily need to have insulin at home but may be okay on metformin alone.  Best treatment for him at this point is to lose  weight to help his insulin resistance -CBGs are stable  Acute otitis media -Patient with new complaints of ear fullness, does have slight changes to his TM on otoscopic exam, he had a low-grade temp this morning of 100.1, will do a short course of Augmentin  Acute blood loss anemia -Received 1 unit of packed red blood cells on 2/16, has no bleeding,  Hypertension -Continue home medications  Obstructive sleep apnea -Continue CPAP  History of asthma -No wheezing  Tobacco use -He will need to quit  Stage I pressure ulcer, not present on admission -Very small area on the right buttock, supportive treatment with foam padding, frequent repositioning  Depression -Patient quite depressed over the last couple of days, also upset about small pressure ulcer.  He is asking for medications to help with his depression -Have started Zoloft    Scheduled Meds: . amoxicillin-clavulanate  1 tablet Oral Q12H  . budesonide-formoterol  2 puff Inhalation BID  . enoxaparin (LOVENOX) injection  110 mg Subcutaneous Q24H  . guaiFENesin  1,200 mg Oral BID  . insulin aspart  0-5 Units Subcutaneous QHS  . insulin aspart  0-9 Units Subcutaneous TID WC  . lidocaine  1 patch Transdermal Q24H  . losartan  25 mg Oral Daily  . metFORMIN  500 mg Oral Q breakfast  . oxyCODONE  10 mg Oral Q12H  . pantoprazole  40 mg Oral Daily  . senna-docusate  3 tablet Oral BID  . sertraline  25 mg Oral Daily   Continuous Infusions: . lactated ringers 10 mL/hr at 07/21/18 1159   PRN Meds:.acetaminophen, albuterol, alum & mag hydroxide-simeth, diphenhydrAMINE, diphenhydrAMINE-zinc acetate, Glycerin (  Adult), hydrALAZINE, HYDROmorphone (DILAUDID) injection, methocarbamol, metoCLOPramide **OR** metoCLOPramide (REGLAN) injection, morphine injection, ondansetron **OR** ondansetron (ZOFRAN) IV, oxyCODONE, oxyCODONE, polyethylene glycol, sorbitol  DVT prophylaxis: Lovenox Code Status: Full code Family Communication: No family  at bedside Disposition Plan: CIR when accepted  Consultants:   Orthopedic surgery  Procedures:   2/14, repair bilateral quadriceps with Dr. Roda Shutters  Antimicrobials:  None    Objective: Vitals:   07/30/18 0209 07/30/18 1301 07/31/18 0601 07/31/18 0909  BP: (!) 103/33 (!) 120/48 (!) 155/66   Pulse: 79 68 66 93  Resp:  17 18 18   Temp: 99.2 F (37.3 C) 98 F (36.7 C) 98.7 F (37.1 C)   TempSrc: Oral Oral Oral   SpO2: 93% 97% 100% 96%  Weight:      Height:        Intake/Output Summary (Last 24 hours) at 07/31/2018 1221 Last data filed at 07/31/2018 0900 Gross per 24 hour  Intake 600 ml  Output 900 ml  Net -300 ml   Filed Weights   07/21/18 1153  Weight: (!) 226.8 kg    Examination:  Constitutional: NAD Respiratory: CTA Cardiovascular: RRR  Data Reviewed: I have independently reviewed following labs and imaging studies   CBC: Recent Labs  Lab 07/25/18 0819 07/27/18 0545 07/30/18 0411  WBC 8.2 7.5 7.7  HGB 7.7* 7.7* 7.8*  HCT 23.6* 23.8* 24.4*  MCV 95.9 96.7 97.2  PLT 143* 164 180   Basic Metabolic Panel: Recent Labs  Lab 07/27/18 0545 07/30/18 0411  NA 136 135  K 4.0 3.9  CL 100 101  CO2 24 24  GLUCOSE 180* 166*  BUN 13 12  CREATININE 0.69 0.74  CALCIUM 8.3* 8.3*   GFR: Estimated Creatinine Clearance: 228.5 mL/min (by C-G formula based on SCr of 0.74 mg/dL). Liver Function Tests: No results for input(s): AST, ALT, ALKPHOS, BILITOT, PROT, ALBUMIN in the last 168 hours. No results for input(s): LIPASE, AMYLASE in the last 168 hours. No results for input(s): AMMONIA in the last 168 hours. Coagulation Profile: No results for input(s): INR, PROTIME in the last 168 hours. Cardiac Enzymes: No results for input(s): CKTOTAL, CKMB, CKMBINDEX, TROPONINI in the last 168 hours. BNP (last 3 results) No results for input(s): PROBNP in the last 8760 hours. HbA1C: No results for input(s): HGBA1C in the last 72 hours. CBG: Recent Labs  Lab 07/30/18 0632  07/30/18 1127 07/30/18 1704 07/30/18 2111 07/31/18 0734  GLUCAP 188* 160* 126* 117* 136*   Lipid Profile: No results for input(s): CHOL, HDL, LDLCALC, TRIG, CHOLHDL, LDLDIRECT in the last 72 hours. Thyroid Function Tests: No results for input(s): TSH, T4TOTAL, FREET4, T3FREE, THYROIDAB in the last 72 hours. Anemia Panel: No results for input(s): VITAMINB12, FOLATE, FERRITIN, TIBC, IRON, RETICCTPCT in the last 72 hours. Urine analysis:    Component Value Date/Time   COLORURINE YELLOW 03/21/2017 1358   APPEARANCEUR CLEAR 03/21/2017 1358   LABSPEC 1.020 03/21/2017 1358   PHURINE 6.5 03/21/2017 1358   GLUCOSEU NEGATIVE 03/21/2017 1358   HGBUR NEGATIVE 03/21/2017 1358   BILIRUBINUR NEGATIVE 03/21/2017 1358   KETONESUR NEGATIVE 03/21/2017 1358   PROTEINUR 30 (A) 03/13/2007 2338   UROBILINOGEN 0.2 03/21/2017 1358   NITRITE NEGATIVE 03/21/2017 1358   LEUKOCYTESUR NEGATIVE 03/21/2017 1358   Sepsis Labs: Invalid input(s): PROCALCITONIN, LACTICIDVEN  No results found for this or any previous visit (from the past 240 hour(s)).    Radiology Studies: No results found.  Pamella Pert, MD, PhD [facility]  Contact via  [email or web address]  [place] Office Info P: [phone]  F: [phone]

## 2018-08-01 ENCOUNTER — Other Ambulatory Visit: Payer: Self-pay

## 2018-08-01 ENCOUNTER — Encounter (HOSPITAL_COMMUNITY): Payer: Self-pay

## 2018-08-01 ENCOUNTER — Inpatient Hospital Stay (HOSPITAL_COMMUNITY)
Admission: RE | Admit: 2018-08-01 | Discharge: 2018-08-30 | DRG: 949 | Disposition: A | Discharge status: Home Health | Payer: No Typology Code available for payment source | Source: Intra-hospital | Attending: Physical Medicine & Rehabilitation | Admitting: Physical Medicine & Rehabilitation

## 2018-08-01 DIAGNOSIS — G4733 Obstructive sleep apnea (adult) (pediatric): Secondary | ICD-10-CM | POA: Diagnosis present

## 2018-08-01 DIAGNOSIS — Z87442 Personal history of urinary calculi: Secondary | ICD-10-CM

## 2018-08-01 DIAGNOSIS — H6693 Otitis media, unspecified, bilateral: Secondary | ICD-10-CM | POA: Diagnosis present

## 2018-08-01 DIAGNOSIS — L89311 Pressure ulcer of right buttock, stage 1: Secondary | ICD-10-CM | POA: Diagnosis present

## 2018-08-01 DIAGNOSIS — F1210 Cannabis abuse, uncomplicated: Secondary | ICD-10-CM | POA: Diagnosis present

## 2018-08-01 DIAGNOSIS — R7309 Other abnormal glucose: Secondary | ICD-10-CM

## 2018-08-01 DIAGNOSIS — L299 Pruritus, unspecified: Secondary | ICD-10-CM | POA: Diagnosis not present

## 2018-08-01 DIAGNOSIS — J029 Acute pharyngitis, unspecified: Secondary | ICD-10-CM | POA: Diagnosis not present

## 2018-08-01 DIAGNOSIS — F331 Major depressive disorder, recurrent, moderate: Secondary | ICD-10-CM | POA: Diagnosis not present

## 2018-08-01 DIAGNOSIS — Z716 Tobacco abuse counseling: Secondary | ICD-10-CM

## 2018-08-01 DIAGNOSIS — D62 Acute posthemorrhagic anemia: Secondary | ICD-10-CM

## 2018-08-01 DIAGNOSIS — E8809 Other disorders of plasma-protein metabolism, not elsewhere classified: Secondary | ICD-10-CM | POA: Diagnosis present

## 2018-08-01 DIAGNOSIS — R0989 Other specified symptoms and signs involving the circulatory and respiratory systems: Secondary | ICD-10-CM | POA: Diagnosis present

## 2018-08-01 DIAGNOSIS — E1165 Type 2 diabetes mellitus with hyperglycemia: Secondary | ICD-10-CM

## 2018-08-01 DIAGNOSIS — F339 Major depressive disorder, recurrent, unspecified: Secondary | ICD-10-CM | POA: Diagnosis present

## 2018-08-01 DIAGNOSIS — M7989 Other specified soft tissue disorders: Secondary | ICD-10-CM | POA: Diagnosis not present

## 2018-08-01 DIAGNOSIS — H838X3 Other specified diseases of inner ear, bilateral: Secondary | ICD-10-CM | POA: Diagnosis present

## 2018-08-01 DIAGNOSIS — R197 Diarrhea, unspecified: Secondary | ICD-10-CM | POA: Diagnosis not present

## 2018-08-01 DIAGNOSIS — J45909 Unspecified asthma, uncomplicated: Secondary | ICD-10-CM | POA: Diagnosis present

## 2018-08-01 DIAGNOSIS — S76119S Strain of unspecified quadriceps muscle, fascia and tendon, sequela: Secondary | ICD-10-CM | POA: Diagnosis not present

## 2018-08-01 DIAGNOSIS — W010XXD Fall on same level from slipping, tripping and stumbling without subsequent striking against object, subsequent encounter: Secondary | ICD-10-CM | POA: Diagnosis present

## 2018-08-01 DIAGNOSIS — F17210 Nicotine dependence, cigarettes, uncomplicated: Secondary | ICD-10-CM | POA: Diagnosis present

## 2018-08-01 DIAGNOSIS — K5900 Constipation, unspecified: Secondary | ICD-10-CM | POA: Diagnosis present

## 2018-08-01 DIAGNOSIS — S76111D Strain of right quadriceps muscle, fascia and tendon, subsequent encounter: Secondary | ICD-10-CM | POA: Diagnosis not present

## 2018-08-01 DIAGNOSIS — S76111S Strain of right quadriceps muscle, fascia and tendon, sequela: Secondary | ICD-10-CM | POA: Diagnosis not present

## 2018-08-01 DIAGNOSIS — E46 Unspecified protein-calorie malnutrition: Secondary | ICD-10-CM | POA: Diagnosis not present

## 2018-08-01 DIAGNOSIS — H6690 Otitis media, unspecified, unspecified ear: Secondary | ICD-10-CM

## 2018-08-01 DIAGNOSIS — M25512 Pain in left shoulder: Secondary | ICD-10-CM | POA: Diagnosis present

## 2018-08-01 DIAGNOSIS — G479 Sleep disorder, unspecified: Secondary | ICD-10-CM | POA: Diagnosis present

## 2018-08-01 DIAGNOSIS — Z7951 Long term (current) use of inhaled steroids: Secondary | ICD-10-CM | POA: Diagnosis not present

## 2018-08-01 DIAGNOSIS — G8918 Other acute postprocedural pain: Secondary | ICD-10-CM

## 2018-08-01 DIAGNOSIS — H938X3 Other specified disorders of ear, bilateral: Secondary | ICD-10-CM | POA: Diagnosis not present

## 2018-08-01 DIAGNOSIS — Z7984 Long term (current) use of oral hypoglycemic drugs: Secondary | ICD-10-CM

## 2018-08-01 DIAGNOSIS — Z6841 Body Mass Index (BMI) 40.0 and over, adult: Secondary | ICD-10-CM | POA: Diagnosis not present

## 2018-08-01 DIAGNOSIS — S76119A Strain of unspecified quadriceps muscle, fascia and tendon, initial encounter: Secondary | ICD-10-CM | POA: Diagnosis present

## 2018-08-01 DIAGNOSIS — S76112D Strain of left quadriceps muscle, fascia and tendon, subsequent encounter: Secondary | ICD-10-CM | POA: Diagnosis present

## 2018-08-01 DIAGNOSIS — F419 Anxiety disorder, unspecified: Secondary | ICD-10-CM | POA: Diagnosis present

## 2018-08-01 DIAGNOSIS — K5903 Drug induced constipation: Secondary | ICD-10-CM

## 2018-08-01 DIAGNOSIS — Z89021 Acquired absence of right finger(s): Secondary | ICD-10-CM | POA: Diagnosis not present

## 2018-08-01 DIAGNOSIS — R0981 Nasal congestion: Secondary | ICD-10-CM | POA: Diagnosis not present

## 2018-08-01 DIAGNOSIS — Z833 Family history of diabetes mellitus: Secondary | ICD-10-CM | POA: Diagnosis not present

## 2018-08-01 DIAGNOSIS — S76112S Strain of left quadriceps muscle, fascia and tendon, sequela: Secondary | ICD-10-CM | POA: Diagnosis not present

## 2018-08-01 DIAGNOSIS — R42 Dizziness and giddiness: Secondary | ICD-10-CM | POA: Diagnosis not present

## 2018-08-01 DIAGNOSIS — Z791 Long term (current) use of non-steroidal anti-inflammatories (NSAID): Secondary | ICD-10-CM

## 2018-08-01 DIAGNOSIS — E441 Mild protein-calorie malnutrition: Secondary | ICD-10-CM | POA: Diagnosis present

## 2018-08-01 DIAGNOSIS — Z79899 Other long term (current) drug therapy: Secondary | ICD-10-CM

## 2018-08-01 DIAGNOSIS — E119 Type 2 diabetes mellitus without complications: Secondary | ICD-10-CM | POA: Diagnosis present

## 2018-08-01 DIAGNOSIS — S76119D Strain of unspecified quadriceps muscle, fascia and tendon, subsequent encounter: Secondary | ICD-10-CM | POA: Diagnosis not present

## 2018-08-01 LAB — GLUCOSE, CAPILLARY
GLUCOSE-CAPILLARY: 139 mg/dL — AB (ref 70–99)
Glucose-Capillary: 143 mg/dL — ABNORMAL HIGH (ref 70–99)
Glucose-Capillary: 136 mg/dL — ABNORMAL HIGH (ref 70–99)

## 2018-08-01 MED ORDER — ACETAMINOPHEN 325 MG PO TABS
325.0000 mg | ORAL_TABLET | Freq: Four times a day (QID) | ORAL | Status: DC | PRN
  Administered 2018-08-09 – 2018-08-26 (×5): 650 mg via ORAL
  Administered 2018-08-29: 325 mg via ORAL
  Filled 2018-08-01 (×6): qty 2

## 2018-08-01 MED ORDER — NAPHAZOLINE-PHENIRAMINE 0.025-0.3 % OP SOLN
1.0000 [drp] | Freq: Four times a day (QID) | OPHTHALMIC | Status: DC | PRN
  Administered 2018-08-01: 1 [drp] via OPHTHALMIC
  Filled 2018-08-01: qty 15

## 2018-08-01 MED ORDER — PANTOPRAZOLE SODIUM 40 MG PO TBEC
40.0000 mg | DELAYED_RELEASE_TABLET | Freq: Every day | ORAL | Status: DC
  Administered 2018-08-02 – 2018-08-30 (×29): 40 mg via ORAL
  Filled 2018-08-01 (×29): qty 1

## 2018-08-01 MED ORDER — OXYCODONE HCL ER 10 MG PO T12A: 10.0000 mg | EXTENDED_RELEASE_TABLET | Freq: Two times a day (BID) | ORAL | Status: DC

## 2018-08-01 MED ORDER — BUDESONIDE-FORMOTEROL FUMARATE 80-4.5 MCG/ACT IN AERO
2.0000 | INHALATION_SPRAY | Freq: Two times a day (BID) | RESPIRATORY_TRACT | Status: DC
  Administered 2018-08-01 – 2018-08-05 (×8): 2 via RESPIRATORY_TRACT

## 2018-08-01 MED ORDER — METFORMIN HCL 500 MG PO TABS: 500.0000 mg | ORAL_TABLET | Freq: Every day | ORAL | Status: DC

## 2018-08-01 MED ORDER — LOSARTAN POTASSIUM 50 MG PO TABS
25.0000 mg | ORAL_TABLET | Freq: Every day | ORAL | Status: DC
  Administered 2018-08-02 – 2018-08-30 (×29): 25 mg via ORAL
  Filled 2018-08-01 (×29): qty 1

## 2018-08-01 MED ORDER — INSULIN ASPART 100 UNIT/ML ~~LOC~~ SOLN
0.0000 [IU] | Freq: Three times a day (TID) | SUBCUTANEOUS | Status: DC
  Administered 2018-08-01: 1 [IU] via SUBCUTANEOUS
  Administered 2018-08-02: 2 [IU] via SUBCUTANEOUS
  Administered 2018-08-02 – 2018-08-13 (×24): 1 [IU] via SUBCUTANEOUS
  Administered 2018-08-13: 2 [IU] via SUBCUTANEOUS
  Administered 2018-08-14 (×2): 1 [IU] via SUBCUTANEOUS
  Administered 2018-08-14: 2 [IU] via SUBCUTANEOUS
  Administered 2018-08-15: 1 [IU] via SUBCUTANEOUS
  Administered 2018-08-15: 2 [IU] via SUBCUTANEOUS
  Administered 2018-08-15 – 2018-08-16 (×2): 1 [IU] via SUBCUTANEOUS
  Administered 2018-08-16: 2 [IU] via SUBCUTANEOUS
  Administered 2018-08-17 – 2018-08-18 (×4): 1 [IU] via SUBCUTANEOUS
  Administered 2018-08-18 – 2018-08-19 (×2): 2 [IU] via SUBCUTANEOUS
  Administered 2018-08-19 – 2018-08-20 (×4): 1 [IU] via SUBCUTANEOUS
  Administered 2018-08-21: 2 [IU] via SUBCUTANEOUS
  Administered 2018-08-22 – 2018-08-27 (×10): 1 [IU] via SUBCUTANEOUS
  Administered 2018-08-28: 2 [IU] via SUBCUTANEOUS
  Administered 2018-08-29: 1 [IU] via SUBCUTANEOUS

## 2018-08-01 MED ORDER — SENNOSIDES-DOCUSATE SODIUM 8.6-50 MG PO TABS
3.0000 | ORAL_TABLET | Freq: Two times a day (BID) | ORAL | Status: DC
  Administered 2018-08-01 – 2018-08-20 (×38): 3 via ORAL
  Administered 2018-08-23: 1 via ORAL
  Filled 2018-08-01 (×50): qty 3

## 2018-08-01 MED ORDER — METHOCARBAMOL 750 MG PO TABS
750.0000 mg | ORAL_TABLET | Freq: Three times a day (TID) | ORAL | Status: DC | PRN
  Administered 2018-08-02 – 2018-08-25 (×10): 750 mg via ORAL
  Filled 2018-08-01 (×12): qty 1

## 2018-08-01 MED ORDER — GUAIFENESIN ER 600 MG PO TB12
1200.0000 mg | ORAL_TABLET | Freq: Two times a day (BID) | ORAL | Status: DC
  Administered 2018-08-01 – 2018-08-30 (×49): 1200 mg via ORAL
  Filled 2018-08-01 (×56): qty 2

## 2018-08-01 MED ORDER — OXYCODONE HCL 15 MG PO TABS: 15.0000 mg | ORAL_TABLET | ORAL | 0 refills | Status: DC | PRN

## 2018-08-01 MED ORDER — AMOXICILLIN-POT CLAVULANATE 875-125 MG PO TABS
1.0000 | ORAL_TABLET | Freq: Two times a day (BID) | ORAL | Status: AC
  Administered 2018-08-01 – 2018-08-06 (×11): 1 via ORAL
  Filled 2018-08-01 (×11): qty 1

## 2018-08-01 MED ORDER — ENOXAPARIN SODIUM 120 MG/0.8ML ~~LOC~~ SOLN: 110.0000 mg | SUBCUTANEOUS | Status: DC

## 2018-08-01 MED ORDER — OXYCODONE HCL 5 MG PO TABS
10.0000 mg | ORAL_TABLET | ORAL | Status: DC | PRN
  Administered 2018-08-02 – 2018-08-03 (×4): 15 mg via ORAL
  Administered 2018-08-04: 10 mg via ORAL
  Administered 2018-08-05: 15 mg via ORAL
  Filled 2018-08-01 (×6): qty 3

## 2018-08-01 MED ORDER — ONDANSETRON HCL 4 MG/2ML IJ SOLN: 4.0000 mg | Freq: Four times a day (QID) | INTRAMUSCULAR | Status: DC | PRN

## 2018-08-01 MED ORDER — ONDANSETRON HCL 4 MG PO TABS
4.0000 mg | ORAL_TABLET | Freq: Four times a day (QID) | ORAL | Status: DC | PRN
  Administered 2018-08-21: 4 mg via ORAL
  Filled 2018-08-01 (×2): qty 1

## 2018-08-01 MED ORDER — METFORMIN HCL 500 MG PO TABS
500.0000 mg | ORAL_TABLET | Freq: Every day | ORAL | Status: DC
  Administered 2018-08-02 – 2018-08-14 (×13): 500 mg via ORAL
  Filled 2018-08-01 (×13): qty 1

## 2018-08-01 MED ORDER — SORBITOL 70 % SOLN: 30.0000 mL | Freq: Every day | Status: DC | PRN

## 2018-08-01 MED ORDER — GLYCERIN (LAXATIVE) 2.1 G RE SUPP
1.0000 | Freq: Every day | RECTAL | Status: DC | PRN
  Filled 2018-08-01: qty 1

## 2018-08-01 MED ORDER — SERTRALINE HCL 25 MG PO TABS: 25.0000 mg | ORAL_TABLET | Freq: Every day | ORAL | Status: DC

## 2018-08-01 MED ORDER — ENOXAPARIN SODIUM 120 MG/0.8ML ~~LOC~~ SOLN
110.0000 mg | SUBCUTANEOUS | Status: DC
  Administered 2018-08-01 – 2018-08-28 (×28): 110 mg via SUBCUTANEOUS
  Filled 2018-08-01 (×30): qty 0.73

## 2018-08-01 MED ORDER — OXYCODONE HCL ER 10 MG PO T12A
10.0000 mg | EXTENDED_RELEASE_TABLET | Freq: Two times a day (BID) | ORAL | Status: DC
  Administered 2018-08-01 – 2018-08-06 (×10): 10 mg via ORAL
  Filled 2018-08-01 (×10): qty 1

## 2018-08-01 MED ORDER — ALBUTEROL SULFATE (2.5 MG/3ML) 0.083% IN NEBU: 2.5000 mg | INHALATION_SOLUTION | RESPIRATORY_TRACT | Status: DC | PRN

## 2018-08-01 MED ORDER — LIDOCAINE 5 % EX PTCH
1.0000 | MEDICATED_PATCH | CUTANEOUS | Status: DC
  Administered 2018-08-02 – 2018-08-22 (×7): 1 via TRANSDERMAL
  Filled 2018-08-01 (×12): qty 1

## 2018-08-01 MED ORDER — AMOXICILLIN-POT CLAVULANATE 875-125 MG PO TABS: 1.0000 | ORAL_TABLET | Freq: Two times a day (BID) | ORAL | Status: DC

## 2018-08-01 MED ORDER — POLYETHYLENE GLYCOL 3350 17 G PO PACK: 17.0000 g | PACK | Freq: Every day | ORAL | Status: DC | PRN

## 2018-08-01 MED ORDER — NAPHAZOLINE-PHENIRAMINE 0.025-0.3 % OP SOLN
1.0000 [drp] | Freq: Four times a day (QID) | OPHTHALMIC | Status: DC | PRN
  Filled 2018-08-01: qty 15

## 2018-08-01 MED ORDER — ALUM & MAG HYDROXIDE-SIMETH 200-200-20 MG/5ML PO SUSP
15.0000 mL | ORAL | Status: DC | PRN
  Administered 2018-08-03 – 2018-08-04 (×2): 15 mL via ORAL
  Filled 2018-08-01 (×2): qty 30

## 2018-08-01 MED ORDER — SERTRALINE HCL 50 MG PO TABS
25.0000 mg | ORAL_TABLET | Freq: Every day | ORAL | Status: DC
  Administered 2018-08-02 – 2018-08-30 (×29): 25 mg via ORAL
  Filled 2018-08-01 (×29): qty 1

## 2018-08-01 NOTE — IPOC Note (Addendum)
Overall Plan of Care [facility]) Patient Details Name: Larry Fowler MRN: [id number] DOB: 03-Mar-1971  Admitting Diagnosis: B/L quad tendon rupture  Hospital Problems: Active Problems:   Quadriceps tendon rupture   Hypoalbuminemia due to protein-calorie malnutrition (HCC)   New onset type 2 diabetes mellitus (HCC)   Sleep disturbance   Labile blood pressure     Functional Problem List: Nursing Endurance, Skin Integrity, Safety, Pain  PT Balance, Edema, Endurance, Pain, Safety, Skin Integrity  OT Balance, Endurance, Pain  SLP    TR         Basic ADL's: OT Grooming, Bathing, Dressing, Toileting     Advanced  ADL's: OT       Transfers: PT Bed Mobility, Bed to Chair, Car, Occupational psychologist, Research scientist (life sciences): PT Ambulation, Psychologist, prison and probation services, Stairs     Additional Impairments: OT None  SLP        TR      Anticipated Outcomes Item Anticipated Outcome  Self Feeding independent  Swallowing      Basic self-care  supervision to min assist  Toileting  min assist level   Bathroom Transfers min assist level  Bowel/Bladder  Patient willl continue to be continent of bowel and bladder during admission.  Transfers  Mod assist with LRAD    Locomotion  mod assist with LRAD at [facility] level   Communication     Cognition     Pain  Patient will be pain free of pain less than 3 during admission  Safety/Judgment  Patient will be free from falls and adhere to safety plan   Therapy Plan: PT Intensity: Minimum of 1-2 x/day ,45 to 90 minutes PT Frequency: 5 out of 7 days PT Duration Estimated Length of Stay: 2-3 weeks  OT Intensity: Minimum of 1-2 x/day, 45 to 90 minutes OT Frequency: 5 out of 7 days OT Duration/Estimated Length of Stay: 19-21 days      Team Interventions: Nursing Interventions Patient/Family Education, Pain Management, Skin Care/Wound Management, Disease Management/Prevention  PT interventions Ambulation/gait training, Designer, jewellery, Cognitive remediation/compensation, Community reintegration, Discharge planning, Disease management/prevention, DME/adaptive equipment instruction, Functional mobility training, Pain management, Patient/family education, Psychosocial support, Therapeutic Activities, Neuromuscular re-education, Skin care/wound management, UE/LE Coordination activities, Wheelchair propulsion/positioning, Therapeutic Exercise, Stair training, UE/LE Strength taining/ROM, Visual/perceptual remediation/compensation, Splinting/orthotics  OT Interventions Balance/vestibular training, Discharge planning, DME/adaptive equipment instruction, Functional mobility training, Pain management, Psychosocial support, Therapeutic Activities, UE/LE Strength taining/ROM, UE/LE Coordination activities, Therapeutic Exercise, Self Care/advanced ADL retraining, Patient/family education, Community reintegration, Wheelchair propulsion/positioning  SLP Interventions    TR Interventions    SW/CM Interventions Discharge Planning, Psychosocial Support, Patient/Family Education   Barriers to Discharge MD  Medical stability and Weight  Nursing      PT Inaccessible home environment, Decreased caregiver support, Home environment access/layout, Weight, Weight bearing restrictions    OT Home environment access/layout Pt with locked out hinged knee braces.  Will need have higher surfaces to transfer to for success with toilet and wheelchair transfers.    SLP      SW Lack of/limited family support, Inaccessible home environment Have asked wife to begin thinking about and talking with others in order to cover 24/7.  Wife was made aware by [facility] and myself that a ramp needs to be put into place ASAP.   Team Discharge Planning: Destination: PT-Home ,OT-   , SLP-  Projected Follow-up: PT-Home health PT, OT-  Home health OT, 24 hour supervision/assistance, SLP-  Projected Equipment Needs: PT-Wheelchair (  measurements), Wheelchair cushion  (measurements), Sliding board, Rolling walker with 5" wheels, OT- 3 in 1 bedside comode, Tub/shower bench(heavy duty bariatric), SLP-  Equipment Details: PT- , OT-  Patient/family involved in discharge planning: PT- Patient,  OT-Patient, SLP-   MD ELOS: 18-22 days. Medical Rehab Prognosis:  Good Assessment: 48 year old right handed male with history of OSA-CPAP,tobacco abuse, anxiety/depression, asthma, super morbid obesity with BMI 64. Presented 07/20/2018 after slipping and falling backwards with acute onset of bilateral knee pain. CT of the knees done revealing bilateral quad tendon rupture and underwent repair on 07/21/2018 by Dr.Xu. Postoperative weightbearing as tolerated with bilateral Bledsoe knee braces is locked in extension. Ongoing issues of pain management currently maintained on scheduled OxyContin as well as oxycodone immediate release for breakthrough pain. Acute blood loss anemia 7.8 was transfused 1 unit packed red blood cells with latest hemoglobin maintaining stable at 7.8.patient had been on subcutaneous .Currently maintained on Lovenox for DVT prophylaxis. Findings of elevated hemoglobin A1c of 7.1 and blood sugars monitored. Placed on Augmentin after patient spiked low-grade fevers findings of acute otitis media. Patient with resulting functional deficits with mobility, transfers, self-care.  We will set goals for Min/Mod A with PT/OT.  See Team Conference Notes for weekly updates to the plan of care

## 2018-08-01 NOTE — Discharge Summary (Addendum)
Physician Discharge Summary  [name]:[id number] DOB: [birth year] DOA: 07/20/2018  PCP: [name], MD  Admit date: 07/20/2018 Discharge date: 08/01/2018  Admitted From: home Disposition:  CIR  Recommendations for Outpatient Follow-up:  1. Follow up with PCP in 1-2 weeks 2. Continue Augmentin for 5 additional days  Home Health: None Equipment/Devices: None  Discharge Condition: Stable CODE STATUS: Full code Diet recommendation: Low calorie diet  HPI: Per admitting MD, Larry Fowler is a 48 y.o. male with history of morbid obesity, asthma, hypertension, sleep apnea had a fall at home while walking to the bathroom.  Patient slipped and fell backwards.  Following which patient's knee started hurting.  Denies any loss of consciousness chest pain. ED Course: In the ER patient was found to have bilateral knee pain for which patient had CAT scan done which shows bilateral quadricep tendon tear.  On-call orthopedic surgeon Dr.Xu was consulted and since patient has medical issues admitted to hospitalist service.  Hospital Course: Principal Problem Bilateral quadricep tendon tear -patient was admitted to the hospital with bilateral knee pain, was found to have bilateral quadricep tendon tear.  Orthopedic surgery was consulted and patient was taken to the operating room on 2/14 and underwent repair.  Following the surgery, he was evaluated by physical therapy and recommended CIR.  He improved and progressed well with physical therapy in the hospital, and he will be transferred to [facility] for further inpatient rehabilitation.  Recommend to continue Lovenox for DVT prophylaxis up until he is more mobile   Active Problems Type 2 diabetes mellitus -A1c 7.1, is probably has significant insulin resistance due to obesity.  Placed on sliding scale insulin.  He can probably go on metformin when he leaves the inpatient rehab Acute otitis media -Patient with new complaints of ear fullness,  does have slight changes to his TM on otoscopic exam, recommend a short course of Augmentin.  He is already improving Acute blood loss anemia -Received 1 unit of packed red blood cells on 2/16, has no bleeding, and his hemoglobin has remained stable Hypertension -Continue home medications Obstructive sleep apnea -Continue CPAP History of asthma -No wheezing Tobacco use -He will need to quit Stage I pressure ulcer, not present on admission -Very small area on the right buttock, supportive treatment with foam padding, frequent repositioning Super morbid obesity -will need significant weight loss Depression -Patient quite depressed over the last couple of days, also upset about small pressure ulcer.  He is asking for medications to help with his depression. Have started Zoloft, further follow-up with outpatient PCP  Discharge Diagnoses:  Principal Problem:   Quadriceps tendon rupture Active Problems:   Asthma   Morbid obesity (HCC)   OSA (obstructive sleep apnea)   HTN (hypertension)   Hyperglycemia   Rupture of left quadriceps muscle   Postoperative anemia due to acute blood loss   Anxiety and depression   Super-super obese (HCC)   Postoperative pain   Tobacco abuse   Pressure injury of skin     Discharge Instructions   Allergies as of 08/01/2018   No Known Allergies     Medication List    STOP taking these medications   naproxen 500 MG tablet Commonly known as:  NAPROSYN     TAKE these medications   albuterol 108 (90 Base) MCG/ACT inhaler Commonly known as:  VENTOLIN HFA Inhale 2 puffs into the lungs every 6 (six) hours as needed for wheezing.   amoxicillin-clavulanate 875-125 MG tablet Commonly  known as:  AUGMENTIN Take 1 tablet by mouth every 12 (twelve) hours.   budesonide-formoterol 80-4.5 MCG/ACT inhaler Commonly known as:  SYMBICORT Inhale 2 puffs into the lungs 2 (two) times daily.   Diclofenac Sodium 2 % Soln Place 2 g onto the skin 2 (two) times  daily.   enoxaparin 120 MG/0.8ML injection Commonly known as:  LOVENOX Inject 0.73 mLs (110 mg total) into the skin daily.   losartan 25 MG tablet Commonly known as:  COZAAR Take 1 tablet (25 mg total) by mouth 2 (two) times daily.   metFORMIN 500 MG tablet Commonly known as:  GLUCOPHAGE Take 1 tablet (500 mg total) by mouth daily with breakfast. Start taking on:  August 02, 2018   oxyCODONE 15 MG immediate release tablet Commonly known as:  ROXICODONE Take 1-1.5 tablets (15-22.5 mg total) by mouth every 4 (four) hours as needed for severe pain (pain score 7-10).   oxyCODONE 10 mg 12 hr tablet Commonly known as:  OXYCONTIN Take 1 tablet (10 mg total) by mouth every 12 (twelve) hours.   sertraline 25 MG tablet Commonly known as:  ZOLOFT Take 1 tablet (25 mg total) by mouth daily. Start taking on:  August 02, 2018            Durable Medical Equipment  (From admission, onward)         Start     Ordered   07/21/18 1744  DME Walker rolling  Once    Question:  Patient needs a walker to treat with the following condition  Answer:  History of open reduction and internal fixation (ORIF) procedure   07/21/18 1743   07/21/18 1744  DME 3 n 1  Once     07/21/18 1743   07/21/18 1744  DME Bedside commode  Once    Question:  Patient needs a bedside commode to treat with the following condition  Answer:  History of open reduction and internal fixation (ORIF) procedure   07/21/18 1743         Follow-up Information    Tarry Kos, MD In 2 weeks.   Specialty:  Orthopedic Surgery Why:  For suture removal, For wound re-check Contact information: [address] [place] [address] [phone]           Consultations:  Orthopedic surgery  Procedures/Studies:  2/14, repair bilateral quadriceps with Dr. Roda Shutters  Ct Knee Left Wo Contrast  Result Date: 07/20/2018 CLINICAL DATA:  Patient tripped at work and could not get up. Patient heard a pop and has  pain and burning in the knees. EXAM: CT OF THE LEFT KNEE WITHOUT CONTRAST TECHNIQUE: Multidetector CT imaging of the LEFT knee was performed according to the standard protocol. Multiplanar CT image reconstructions were also generated. COMPARISON:  None. FINDINGS: Bones/Joint/Cartilage Small to moderate suprapatellar joint effusion. No acute fracture joint dislocation. No suspicious osseous lesions are identified. Mild enthesopathy off the upper pole the patella along the quadriceps. Chondrocalcinosis of hyaline cartilage. Ligaments Suboptimally assessed by CT. Muscles and Tendons The distal quadriceps muscles and tendon are thickened and somewhat hyperdense in appearance raising suspicion for high-grade intramuscular strain/tear with likely intramuscular hemorrhage accounting for the hyperdense appearance, series 6/59. The patellar tendon appears intact. Soft tissues No soft tissue hematoma or fluid collection. IMPRESSION: 1. Thickened and somewhat hyperdense appearance of the distal quadriceps muscles and tendon raising suspicion a high-grade intramuscular strain/tear with likely intramuscular hemorrhage accounting for the hyperdense appearance. 2. Small to moderate suprapatellar joint effusion.  3. No acute fracture nor joint dislocation. Electronically Signed   By: Tollie Eth M.D.   On: 07/20/2018 20:59   Ct Knee Right Wo Contrast  Result Date: 07/20/2018 CLINICAL DATA:  Larey Seat.  Severe pain. EXAM: CT OF THE right KNEE WITHOUT CONTRAST TECHNIQUE: Multidetector CT imaging of the right knee was performed according to the standard protocol. Multiplanar CT image reconstructions were also generated. COMPARISON:  None. FINDINGS: No acute fracture is identified. There is a calcific density in the posterior joint space posterior to the PCL. The PCL also demonstrates some streaky calcifications. Chondrocalcinosis is noted involving both the medial and lateral menisci. Findings could be due to CPPD arthropathy. The  patella is lying low (patella [place]). The patellar tendon is wavy and redundant. I suspect the quadriceps tendon is completely ruptured and there is a large hematoma and joint effusion in this area. Recommend orthopedic consultation and MRI would be the best test for further evaluation. IMPRESSION: 1. Suspect completely ruptured quadriceps tendon. Associated large hematoma and suprapatellar knee joint effusion. Recommend orthopedic consultation. MRI would be helpful for further evaluation. 2. Chondrocalcinosis suggesting CPPD arthropathy. 3. No acute fractures are demonstrated. Electronically Signed   By: Rudie Meyer M.D.   On: 07/20/2018 20:59   Dg Knee Complete 4 Views Left  Result Date: 07/20/2018 CLINICAL DATA:  Pt c/o bilateral knee pain after slipping and falling on a wet floor today. Hx of previous arthroscopy knee surgery; pt does not know which laterality was operated on. Best obtainable images due to pt condition. EXAM: LEFT KNEE - COMPLETE 4+ VIEW COMPARISON:  None. FINDINGS: No evidence of fracture, dislocation, or joint effusion. No evidence of arthropathy or other focal bone abnormality. Soft tissues are unremarkable. IMPRESSION: Negative. Electronically Signed   By: Norva Pavlov M.D.   On: 07/20/2018 15:45   Dg Knee Complete 4 Views Right  Result Date: 07/20/2018 CLINICAL DATA:  Pt c/o bilateral knee pain after slipping and falling on a wet floor today. Hx of previous arthroscopy knee surgery; pt does not know which laterality was operated on. Best obtainable images due to pt condition. EXAM: RIGHT KNEE - COMPLETE 4+ VIEW COMPARISON:  None. FINDINGS: No acute fracture or subluxation. Degenerative changes are identified at the patellofemoral compartment. Calcific density along the posterior intercondylar notch raises a question of remote ligamentous injury. IMPRESSION: No evidence for acute abnormality. Electronically Signed   By: Norva Pavlov M.D.   On: 07/20/2018 15:49       Subjective: - no chest pain, shortness of breath, no abdominal pain, nausea or vomiting.   Discharge Exam: BP (!) 117/56 (BP Location: Left Arm)   Pulse 64   Temp 98.4 F (36.9 C) (Oral)   Resp 16   Ht 6\' 2"  (1.88 m)   Wt (!) 226.8 kg   SpO2 100%   BMI 64.20 kg/m   General: Pt is alert, awake, not in acute distress Cardiovascular: RRR, S1/S2 +, no rubs, no gallops Respiratory: CTA bilaterally, no wheezing, no rhonchi Abdominal: Soft, NT, ND, bowel sounds + Extremities: no edema, no cyanosis    The results of significant diagnostics from this hospitalization (including imaging, microbiology, ancillary and laboratory) are listed below for reference.     Microbiology: No results found for this or any previous visit (from the past 240 hour(s)).   Labs: BNP (last 3 results) No results for input(s): BNP in the last 8760 hours. Basic Metabolic Panel: Recent Labs  Lab 07/27/18 0545 07/30/18 0411  NA  136 135  K 4.0 3.9  CL 100 101  CO2 24 24  GLUCOSE 180* 166*  BUN 13 12  CREATININE 0.69 0.74  CALCIUM 8.3* 8.3*   Liver Function Tests: No results for input(s): AST, ALT, ALKPHOS, BILITOT, PROT, ALBUMIN in the last 168 hours. No results for input(s): LIPASE, AMYLASE in the last 168 hours. No results for input(s): AMMONIA in the last 168 hours. CBC: Recent Labs  Lab 07/27/18 0545 07/30/18 0411  WBC 7.5 7.7  HGB 7.7* 7.8*  HCT 23.8* 24.4*  MCV 96.7 97.2  PLT 164 180   Cardiac Enzymes: No results for input(s): CKTOTAL, CKMB, CKMBINDEX, TROPONINI in the last 168 hours. BNP: Invalid input(s): POCBNP CBG: Recent Labs  Lab 07/31/18 1258 07/31/18 1653 07/31/18 2113 08/01/18 0637 08/01/18 1134  GLUCAP 140* 132* 148* 143* 136*   D-Dimer No results for input(s): DDIMER in the last 72 hours. Hgb A1c No results for input(s): HGBA1C in the last 72 hours. Lipid Profile No results for input(s): CHOL, HDL, LDLCALC, TRIG, CHOLHDL, LDLDIRECT in the last 72  hours. Thyroid function studies No results for input(s): TSH, T4TOTAL, T3FREE, THYROIDAB in the last 72 hours.  Invalid input(s): FREET3 Anemia work up No results for input(s): VITAMINB12, FOLATE, FERRITIN, TIBC, IRON, RETICCTPCT in the last 72 hours. Urinalysis    Component Value Date/Time   COLORURINE YELLOW 03/21/2017 1358   APPEARANCEUR CLEAR 03/21/2017 1358   LABSPEC 1.020 03/21/2017 1358   PHURINE 6.5 03/21/2017 1358   GLUCOSEU NEGATIVE 03/21/2017 1358   HGBUR NEGATIVE 03/21/2017 1358   BILIRUBINUR NEGATIVE 03/21/2017 1358   KETONESUR NEGATIVE 03/21/2017 1358   PROTEINUR 30 (A) 03/13/2007 2338   UROBILINOGEN 0.2 03/21/2017 1358   NITRITE NEGATIVE 03/21/2017 1358   LEUKOCYTESUR NEGATIVE 03/21/2017 1358   Sepsis Labs Invalid input(s): PROCALCITONIN,  WBC,  LACTICIDVEN  FURTHER DISCHARGE INSTRUCTIONS:   Get Medicines reviewed and adjusted: Please take all your medications with you for your next visit with your Primary MD   Laboratory/radiological data: Please request your Primary MD to go over all hospital tests and procedure/radiological results at the follow up, please ask your Primary MD to get all Hospital records sent to his/her office.   In some cases, they will be blood work, cultures and biopsy results pending at the time of your discharge. Please request that your primary care M.D. goes through all the records of your hospital data and follows up on these results.   Also Note the following: If you experience worsening of your admission symptoms, develop shortness of breath, life threatening emergency, suicidal or homicidal thoughts you must seek medical attention immediately by calling 911 or calling your MD immediately  if symptoms less severe.   You must read complete instructions/literature along with all the possible adverse reactions/side effects for all the Medicines you take and that have been prescribed to you. Take any new Medicines after you have  completely understood and accpet all the possible adverse reactions/side effects.    Do not drive when taking Pain medications or sleeping medications (Benzodaizepines)   Do not take more than prescribed Pain, Sleep and Anxiety Medications. It is not advisable to combine anxiety,sleep and pain medications without talking with your primary care practitioner   Special Instructions: If you have smoked or chewed Tobacco  in the last 2 yrs please stop smoking, stop any regular Alcohol  and or any Recreational drug use.   Wear Seat belts while driving.   Please note: You were cared for  by a hospitalist during your hospital stay. Once you are discharged, your primary care physician will handle any further medical issues. Please note that NO REFILLS for any discharge medications will be authorized once you are discharged, as it is imperative that you return to your primary care physician (or establish a relationship with a primary care physician if you do not have one) for your post hospital discharge needs so that they can reassess your need for medications and monitor your lab values.  Time coordinating discharge: 35 minutes  SIGNED:  Pamella Pert, PA-S 08/01/2018, 12:39 PM

## 2018-08-01 NOTE — Progress Notes (Signed)
Marcello Fennel, MD  Physician  Physical Medicine and Rehabilitation  Consult Note  Addendum  Date of Service:  07/24/2018 9:04 AM       Related encounter: ED to Hosp-Admission (Current) from 07/20/2018 in [facility]      Expand All Collapse All    Show:Clear all [x] Manual[x] Template[] Copied  Added by: [x] Love, Evlyn Kanner, PA-C[x] Marcello Fennel, MD  [] Hover for details      Physical Medicine and Rehabilitation Consult  Reason for Consult: Functional deficits due to bilateral quad tendon rupture.  Referring Physician: Dr. Jarvis Newcomer   HPI: Larry Fowler is a 48 y.o. male with history of OSA- CPAP,  anxiety/depression, asthma, hyperglycemia, super morbid obesity with BMI 64 who was admitted on 07/20/2018 after a slipping and falling backwards with acute onset of bilateral knee pain.  History taken from chart review and patient.  CT of knees done revealing bilateral quad tendon rupture and he underwent repair on 2/14 by Dr. Roda Shutters. Post op to be WBAT with bilateral KI as well as lovenox for DVT prophylaxis. He has had issues with pain control and OxyContin as well as IV dilaudid added to manage symptoms. He has had progressive drop in H/H from 13.1--> 7.4 and was confused with 1 unit PRBC yesterday. Therapy evaluations done and patient limited by body habitus, lightheaded with movement as well as Bledsoe braces with knees locked in extension. CIR recommended due to functional deficits.    Review of Systems  Constitutional: Negative for chills and fever.  HENT: Negative for hearing loss and tinnitus.   Eyes: Negative for blurred vision and double vision.  Respiratory: Positive for shortness of breath (has to use rescue inhaler twice a day. ).   Cardiovascular: Negative for chest pain and palpitations.  Gastrointestinal: Negative for heartburn and nausea.  Genitourinary: Negative for dysuria and urgency.  Musculoskeletal: Positive for  joint pain and myalgias.  Neurological: Positive for focal weakness. Negative for dizziness and headaches.  Psychiatric/Behavioral: The patient does not have insomnia.   All other systems reviewed and are negative.         Past Medical History:  Diagnosis Date  . ALLERGIC RHINITIS 09/12/2007   Qualifier: Diagnosis of  By: Jonny Ruiz MD, Len Blalock   . ANXIETY 02/16/2007   Qualifier: Diagnosis of  By: Tyrone Apple, Lucy    . ASTHMA 12/17/2009   Qualifier: Diagnosis of  By: Jonny Ruiz MD, Len Blalock   . Cannabis abuse    currently  . Chills with fever   . DEPRESSION 09/12/2007   Qualifier: Diagnosis of  By: Jonny Ruiz MD, Len Blalock   . History of cocaine use    in his 47's  . Hyperglycemia   . Rectal fistula 2009  . Rectal pain   . Renal stone    2008         Past Surgical History:  Procedure Laterality Date  . FINGER SURGERY  2008/09?   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 knee surgury     ? which knee per Dr Thurston Hole in his teens  . TONSILLECTOMY           Family History  Problem Relation Age of Onset  . Diabetes type II Mother   . Pneumonia Father     Social History:  Married. Wife works days at [employer]. 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 He reports that he has been smoking--1  PPD every 2 days. He has never used smokeless tobacco. He reports current alcohol use. He reports current drug use. Drug: Marijuana- "whenever I want"    Allergies: No Known Allergies          Medications Prior to Admission  Medication Sig Dispense Refill  . albuterol (VENTOLIN HFA) 108 (90 Base) MCG/ACT inhaler Inhale 2 puffs into the lungs every 6 (six) hours as needed for wheezing. 1 Inhaler 11  . budesonide-formoterol (SYMBICORT) 80-4.5 MCG/ACT inhaler Inhale 2 puffs into the lungs 2 (two) times daily. 1 Inhaler 6  . Diclofenac Sodium 2 % SOLN Place 2 g onto the skin 2 (two) times daily. 112 g 3  . losartan (COZAAR) 25 MG tablet  Take 1 tablet (25 mg total) by mouth 2 (two) times daily. 180 tablet 3  . naproxen (NAPROSYN) 500 MG tablet TAKE 1 TABLET BY MOUTH TWICE DAILY WITH A MEAL (Patient taking differently: Take 500 mg by mouth 2 (two) times daily with a meal. 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 Status:  Mobility: Bed Mobility Overal bed mobility: Needs Assistance Bed Mobility: Rolling, Sit to Supine, Supine to Sit Rolling: Mod assist, +2 for physical assistance(+3 to help with bed pad placement) Supine to sit: Max assist, +2 for physical assistance(plus 3) Sit to supine: Mod assist, +2 for physical assistance(+3) General bed mobility comments: [name] has a strong upper body, but difficulty reaching across to use bedrail cue to body habitus, and some L shoulder pain; one person supports moving leg, second person helps torso over ( and third to manage/straighten bed pads); 3 person assist as well to get to sit on EOB, one person supporting LEs coming to the floor, and two assisting trunk up and supporting at initial sit because he tended to lean back at first; mod assist to lay back down mostly to help kick LEs enough to clear bed frame; uses trapeze bar quite well  ADL:  Cognition: Cognition Overall Cognitive Status: Within Functional Limits for tasks assessed Orientation Level: Oriented X4 Cognition Arousal/Alertness: Awake/alert Behavior During Therapy: WFL for tasks  assessed/performed Overall Cognitive Status: Within Functional Limits for tasks assessed   Blood pressure (!) 109/51, pulse 82, temperature 99.1 F (37.3 C), temperature source Oral, resp. rate 20, height  (1.88 m), weight (!) 226.8 kg, SpO2 96 %. Physical Exam  Nursing note and vitals reviewed. Constitutional: He is oriented to person, place, and time. He appears well-developed. No distress.  Morbidly obese male. NAD.   HENT:  Head: Normocephalic and atraumatic.  Eyes: EOM are normal. Right eye exhibits no discharge. Left eye exhibits no discharge.  Neck: Normal range of motion. Neck supple.  Cardiovascular: Normal rate and regular rhythm.  Respiratory: Effort normal and breath sounds normal.  GI: Soft. Bowel sounds are normal.  Musculoskeletal:     Comments: BLE with ACE wraps and Bledsoe braces.   Neurological: He is alert and oriented to person, place, and time.  Motor: Bilateral upper extremities: 5/5 proximal distal Bilateral lower extremities: Hip flexion 2/5, knees in brace, ankle dorsiflexion 4/5  Sensation intact light touch  Skin: He is not diaphoretic.  See above  Psychiatric: He has a normal mood and affect. His behavior is normal.    LabResultsLast24Hours        Results for orders placed or performed during the hospital encounter of 07/20/18 (from the past 24 hour(s))  Prepare RBC     Status: None   Collection Time: 07/23/18 11:50 AM  Result Value Ref Range   Order Confirmation      ORDER PROCESSED BY BLOOD BANK Performed at [name], [address], [place], [place] [address]   Glucose, capillary     Status: Abnormal   Collection Time: 07/23/18  1:52 PM  Result Value Ref Range   Glucose-Capillary 223 (H) 70 - 99 mg/dL  Glucose, capillary     Status: Abnormal   Collection Time: 07/23/18  4:21 PM  Result Value Ref Range   Glucose-Capillary 218 (H) 70 - 99 mg/dL  Hemoglobin and hematocrit, blood     Status: Abnormal    Collection Time: 07/23/18  8:17 PM  Result Value Ref Range   Hemoglobin 7.8 (L) 13.0 - 17.0 g/dL   HCT 09.6 (L) 43.8 - 38.1 %  Glucose, capillary     Status: Abnormal   Collection Time: 07/23/18 11:59 PM  Result Value Ref Range   Glucose-Capillary 283 (H) 70 - 99 mg/dL     [id number])  No results found.    Assessment/Plan: Diagnosis: Bilateral quad tendon rupture Labs independently reviewed.  Records reviewed and summated above.  1. Does the need for close, 24 hr/day medical supervision in concert with the patient's rehab needs make it unreasonable for this patient to be served in a less intensive setting? Yes  2. Co-Morbidities requiring supervision/potential complications: OSA (continue CPAP, monitor for daytime somnolence),  Anxiety/depression (ensure anxiety and resulting apprehension do not limit functional progress; consider prn medications if warranted), asthma (monitor respiratory rate and O2 sats with increased physical activity), hyperglycemia (Monitor in accordance with exercise and adjust meds as necessary), super morbid obesity (encourage weight loss), postop pain (Biofeedback training with therapies to help reduce reliance on opiate pain medications, particularly IV Dilaudid, monitor pain control during therapies, and sedation at rest and titrate to maximum efficacy to ensure participation and gains in therapies), ABLA (repeat labs, transfuse again to ensure appropriate perfusion for increased activity tolerance if necessary), tobacco abuse (counsel) 3. Due to safety, skin/wound care, disease management, pain management and patient education, does the patient require 24 hr/day rehab nursing? Yes 4. Does the patient require coordinated care of a physician, rehab nurse, PT (1-2 hrs/day, 5 days/week) and OT (1-2 hrs/day, 5 days/week) to address physical and functional deficits in the context of the above medical diagnosis(es)? Yes Addressing deficits in the  following areas: balance, endurance, locomotion, strength, transferring, bathing, dressing, toileting and psychosocial support 5. Can the patient actively participate in an intensive therapy program of at least 3 hrs of therapy per day at least 5 days per week? Potentially 6. The potential for patient to make measurable gains while on inpatient rehab is excellent 7. Anticipated functional outcomes upon discharge from inpatient rehab are supervision and min assist  with PT, supervision and min assist with OT, n/a with SLP. 8. Estimated rehab length of stay to reach the above functional goals is: 16-20 days. 9. Anticipated D/C setting: Home 10. Anticipated post D/C treatments: HH therapy and Home excercise program 11. Overall Rehab/Functional Prognosis: excellent  RECOMMENDATIONS: This patient's condition is  appropriate for continued rehabilitative care in the following setting: CR 1 pain better controlled and able to tolerate 3 hours of therapy per day. Patient has agreed to participate in recommended program. Yes Note that insurance prior authorization may be required for reimbursement for recommended care.  Comment: Rehab Admissions Coordinator to follow up.   I have personally performed a face to face diagnostic evaluation, including, but not limited to relevant history and physical exam findings, of this patient and developed relevant assessment and plan.  Additionally, I have reviewed and concur with the physician assistant's documentation above.   Maryla Morrow, MD, ABPMR Jacquelynn Cree, PA-C 07/24/2018    Revision History                                  Routing History

## 2018-08-01 NOTE — Progress Notes (Signed)
Jamse Arn, MD  Physician  Physical Medicine and Rehabilitation  PMR Pre-admission  Signed  Date of Service:  07/31/2018 5:00 PM       Related encounter: ED to Hosp-Admission (Current) from 07/20/2018 in [place]         Show:Clear all _0 Manual_1 Template_2 Copied  Added by: _3 Cristina Gong, RN_4 Jamse Arn, MD  _5 Hover for details  PMR Admission Coordinator Pre-Admission Assessment  Patient: Larry Fowler is an 48 y.o., male MRN: [id number] DOB: 1970-08-13 Height: _6  (188 cm) Weight: (!) 226.8 kg  Insurance Information HMO: yes    PPO:      PCP:      IPA:      80/20:      OTHER: [employer] plan PRIMARY[facility]acarias Pontes [employer]      Policy#: [id number]      Subscriber: wife CM Name: Heywood Iles    Phone#: [phone]     Fax#: [phone] Pre-Cert#: [id number] approved until 3/02 when updates are due      Employer: [employer] Benefits:  Phone #: [phone]     Name: 07/31/2018 Eff. Date: 06/07/2018     Deduct: no deductible      Out of Pocket Max: $2500      Life Max: none CIR: $750 co pay per admission      SNF: 80% 120 days Outpatient: $30 co pay per visit     Co-Pay: visits per medical neccesity Home Health: 80%      Co-Pay: visits per medical neccesity DME: 80%     Co-Pay: 20% Providers: in network  SECONDARY: none     Medicaid Application Date:       Case Manager:  Disability Application Date:       Case Worker:   Emergency Publishing copy Information    Name Relation Home Work Mobile   [place] Spouse   [phone]      Current Medical History  Patient Admitting Diagnosis: Bilateral quad tendon rupture  History of Present Illness:  Swelling  : [id number] S Wessling is a 48 year old right handed male with history of OSA-CPAP,tobacco abuse,anxiety/depression, asthma, super morbid obesity with BMI 64.  Presented 07/20/2018 after  slipping and falling backwards with acute onset of bilateral knee pain. CT of the knees done revealing bilateral quad tendon rupture and underwent repair on 07/21/2018 by Dr.Xu. Postoperative weightbearing as tolerated with bilateral Bledsoe knee brace is locked in extension. Ongoing issues of pain management currently maintained on scheduled OxyContin as well as oxycodone immediate release for breakthrough pain. Acute blood loss anemia 7.8 was transfused 1 unit packed red blood cells with latest hemoglobin maintaining stable at 7.8.patient had been on subcutaneous .Currently maintained on Lovenox for DVT prophylaxis. Findings of elevated hemoglobin A1c of 7.1 and blood sugars monitored. Placed on Augmentin after patient spiked low-grade fevers findings of acute otitis media.   Past Medical History      Past Medical History:  Diagnosis Date  . ALLERGIC RHINITIS 09/12/2007   Qualifier: Diagnosis of  By: Jenny Reichmann MD, Hunt Oris   . ANXIETY 02/16/2007   Qualifier: Diagnosis of  By: Reatha Armour, Lucy    . ASTHMA 12/17/2009   Qualifier: Diagnosis of  By: Jenny Reichmann MD, Hunt Oris   . Cannabis abuse    currently  . Chills with fever   . DEPRESSION 09/12/2007   Qualifier: Diagnosis of  By:  [name] MD, Hunt Oris   . History of cocaine use    in his 20's  . Hyperglycemia   . Rectal fistula 2009  . Rectal pain   . Renal stone    2008    Family History   family history includes Diabetes type II in his mother; Pneumonia in his father.  Prior Rehab/Hospitalizations Has the patient had major surgery during 100 days prior to admission? No               Current Medications  Current Facility-Administered Medications:  .  acetaminophen (TYLENOL) tablet 325-650 mg, 325-650 mg, Oral, Q6H PRN, Leandrew Koyanagi, MD, 650 mg at 07/30/18 0116 .  albuterol (PROVENTIL) (2.5 MG/3ML) 0.083% nebulizer solution 2.5 mg, 2.5 mg, Nebulization, Q4H PRN, Elgergawy, Silver Huguenin, MD, 2.5 mg at 07/24/18 2130 .  alum & mag  hydroxide-simeth (MAALOX/MYLANTA) 200-200-20 MG/5ML suspension 15 mL, 15 mL, Oral, Q4H PRN, Elgergawy, Silver Huguenin, MD .  amoxicillin-clavulanate (AUGMENTIN) 875-125 MG per tablet 1 tablet, 1 tablet, Oral, Q12H, Caren Griffins, MD, 1 tablet at 08/01/18 0849 .  budesonide-formoterol (SYMBICORT) 80-4.5 MCG/ACT inhaler 2 puff, 2 puff, Inhalation, BID, Elgergawy, Silver Huguenin, MD, 2 puff at 08/01/18 0855 .  diphenhydrAMINE (BENADRYL) 12.5 MG/5ML elixir 25 mg, 25 mg, Oral, Q4H PRN, Leandrew Koyanagi, MD, 25 mg at 07/31/18 2310 .  diphenhydrAMINE-zinc acetate (BENADRYL) 2-0.1 % cream, , Topical, TID PRN, Cruzita Lederer, Costin M, MD .  enoxaparin (LOVENOX) injection 110 mg, 110 mg, Subcutaneous, Q24H, Vance Gather B, MD, 110 mg at 07/31/18 1516 .  Glycerin (Adult) 2.1 g suppository 1 suppository, 1 suppository, Rectal, Daily PRN, Caren Griffins, MD .  guaiFENesin (MUCINEX) 12 hr tablet 1,200 mg, 1,200 mg, Oral, BID, Elgergawy, Silver Huguenin, MD, 1,200 mg at 07/31/18 1108 .  hydrALAZINE (APRESOLINE) injection 10 mg, 10 mg, Intravenous, Q4H PRN, Leandrew Koyanagi, MD .  HYDROmorphone (DILAUDID) injection 0.5-1 mg, 0.5-1 mg, Intravenous, Q4H PRN, Leandrew Koyanagi, MD, 1 mg at 07/26/18 1522 .  insulin aspart (novoLOG) injection 0-5 Units, 0-5 Units, Subcutaneous, QHS, Patrecia Pour, MD, 2 Units at 07/25/18 2317 .  insulin aspart (novoLOG) injection 0-9 Units, 0-9 Units, Subcutaneous, TID WC, Patrecia Pour, MD, 1 Units at 08/01/18 [phone] .  lactated ringers infusion, , Intravenous, Continuous, Leandrew Koyanagi, MD, Last Rate: 10 mL/hr at 07/21/18 1159 .  lidocaine (LIDODERM) 5 % 1 patch, 1 patch, Transdermal, Q24H, Caren Griffins, MD, 1 patch at 08/01/18 1233 .  losartan (COZAAR) tablet 25 mg, 25 mg, Oral, Daily, Patrecia Pour, MD, 25 mg at 08/01/18 0848 .  metFORMIN (GLUCOPHAGE) tablet 500 mg, 500 mg, Oral, Q breakfast, Patrecia Pour, MD, 500 mg at 08/01/18 0850 .  methocarbamol (ROBAXIN) tablet 750 mg, 750 mg, Oral, Q8H PRN, Leandrew Koyanagi, MD, 750 mg at 08/01/18 8469 .  metoCLOPramide (REGLAN) tablet 5-10 mg, 5-10 mg, Oral, Q8H PRN **OR** metoCLOPramide (REGLAN) injection 5-10 mg, 5-10 mg, Intravenous, Q8H PRN, Leandrew Koyanagi, MD .  morphine 2 MG/ML injection 0.5 mg, 0.5 mg, Intravenous, Q2H PRN, Leandrew Koyanagi, MD, 0.5 mg at 07/21/18 0830 .  naphazoline-pheniramine (NAPHCON-A) 0.025-0.3 % ophthalmic solution 1 drop, 1 drop, Both Eyes, QID PRN, Gherghe, Costin M, MD .  ondansetron (ZOFRAN) tablet 4 mg, 4 mg, Oral, Q6H PRN **OR** ondansetron (ZOFRAN) injection 4 mg, 4 mg, Intravenous, Q6H PRN, Leandrew Koyanagi, MD .  oxyCODONE (Oxy IR/ROXICODONE) immediate release tablet 10-15 mg, 10-15 mg, Oral,  Q4H PRN, Caren Griffins, MD, 15 mg at 08/01/18 6759 .  oxyCODONE (Oxy IR/ROXICODONE) immediate release tablet 15-20 mg, 15-20 mg, Oral, Q4H PRN, Caren Griffins, MD, 20 mg at 07/30/18 1410 .  oxyCODONE (OXYCONTIN) 12 hr tablet 10 mg, 10 mg, Oral, Q12H, Leandrew Koyanagi, MD, 10 mg at 08/01/18 0856 .  pantoprazole (PROTONIX) EC tablet 40 mg, 40 mg, Oral, Daily, Elgergawy, Silver Huguenin, MD, 40 mg at 08/01/18 0850 .  polyethylene glycol (MIRALAX / GLYCOLAX) packet 17 g, 17 g, Oral, Daily PRN, Leandrew Koyanagi, MD, 17 g at 07/30/18 0802 .  senna-docusate (Senokot-S) tablet 3 tablet, 3 tablet, Oral, BID, Elgergawy, Silver Huguenin, MD, 3 tablet at 08/01/18 0848 .  sertraline (ZOLOFT) tablet 25 mg, 25 mg, Oral, Daily, Caren Griffins, MD, 25 mg at 08/01/18 0850 .  sorbitol 70 % solution 30 mL, 30 mL, Oral, Daily PRN, Leandrew Koyanagi, MD  Patients Current Diet:      Diet Order                  Diet Carb Modified Fluid consistency: Thin; Room service appropriate? Yes  Diet effective now               Precautions / Restrictions Precautions Precautions: Fall Precaution Comments: NO knee flexion Other Brace: Bilateral Bledsoe braces locked in extension reinforced with Bil KI for standing.  Restrictions Weight Bearing Restrictions:  Yes RLE Weight Bearing: Weight bearing as tolerated LLE Weight Bearing: Weight bearing as tolerated Other Position/Activity Restrictions: No knee flexion   Has the patient had 2 or more falls or a fall with injury in the past year?No  Prior Activity Level Community (5-7x/wk): independent without AD pta  Prior Functional Level Do you want Prior Function Level of Independence: Independent Comments: owns his own business, He is a Air traffic controller; LOTS of experience as a Engineer, site from other? Self Care: Did the patient need help bathing, dressing, using the toilet or eating?  Independent  Indoor Mobility: Did the patient need assistance with walking from room to room (with or without device)? Independent  Stairs: Did the patient need assistance with internal or external stairs (with or without device)? Independent  Functional Cognition: Did the patient need help planning regular tasks such as shopping or remembering to take medications? Independent  Home Assistive Devices / Equipment Home Assistive Devices/Equipment: CPAP Home Equipment: None  Prior Device Use: Indicate devices/aids used by the patient prior to current illness, exacerbation or injury? None of the above  Prior Functional Level Comments: owns his own business, He is a Air traffic controller; LOTS of experience as a Engineer, site   Prior Functional Level Current Functional Level  Bed Mobility  independent  utilize vital tilt bed  Transfers independent Came upright for 13.5 minutes of standing and able to initiate pre gait training with various amount of UE support on bari walker one for each side. Able to weight shift enough to make very small steps forward and backward right foot; more difficulty moving left foot.   Mobility - Walk/Wheelchair independent   no steps yet  Mobility - Ambulation/Gait independent   Upper Body Dressing independent Minimal assistance, Sitting  Lower Body Dressing independent  Total assistance, Bed level  Grooming independent Set up, Wash/dry face, Standing  Eating/Drinking independent Set up, Sitting, Bed level  Toilet Transfer independent Maximal assistance, +2 for physical assistance, +2 for safety/equipment, Requires wide/bariatric  Bladder Continence continent     Bowel Management  continent   continent uses bedpan  Stair Climbing independent   not attempted  Communication independent No difficulties  Memory intact   intact  Cooking/Meal Prep  independent     Housework  independent   Money Management  independent   Driving  yes     Special needs/care consideration BiPAP/CPAP yes pta CPM n/a Continuous Drip IV n/a Dialysis n/a Life Vest n/a Oxygen n/a Special Bed bariatric bed Trach Size n/a Wound Vac n/a Skin bilateral surgical incisions; ace wraps and bilateral Bledsoe braces locked in extension; 2/22 noted stage 2 area to buttocks, excoriated; 2/22 noted stage 2 to left posterior thigh; moisture associated skin damage to flank and groin Bowel mgmt: continent LBM  2/25 Bladder mgmt: urinal Diabetic mgmt Hgb A1c 7.1 New dx  Previous Home Environment Living Arrangements: Spouse/significant other  Lives With: Spouse Available Help at Discharge: (wife works days at [employer] as Occupational psychologist) Type of Home: [facility] Layout: One level Home Access: Stairs to enter Entrance Stairs-Rails: None Technical brewer of Steps: 3 Bathroom Shower/Tub: Chiropodist: [place]: No  Discharge Living Setting Plans for Discharge Living Setting: Patient's home, Lives with (comment)(wife) Type of Home at Discharge: House Discharge Home Layout: One level Discharge Home Access: Stairs to enter Entrance Stairs-Rails: None Entrance Stairs-Number of Steps: 3 Discharge Bathroom Shower/Tub: Tub/shower unit Discharge Bathroom Toilet: Standard Does the patient have any problems obtaining your medications?:  No  Social/Family/Support Systems Patient Roles: 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 married for 4 years Anticipated Caregiver: wife Anticipated Ambulance person Information: see above Ability/Limitations of Caregiver: wife works days Caregiver Availability: Intermittent Discharge Plan Discussed with Primary Caregiver: Yes Is Caregiver In Agreement with Plan?: Yes Does Caregiver/Family have Issues with Lodging/Transportation while Pt is in Rehab?: No  Goals/Additional Needs Patient/Family Goal for Rehab: supervision to min asist with PT and OT Expected length of stay: ELOS 16 to 20 days Equipment Needs: Bariatric equipment needed Special Service Needs: Patietn needs alot of encouragement; depressed with his overall situation Pt/Family Agrees to Admission and willing to participate: Yes Program Orientation Provided & Reviewed with Pt/Caregiver Including Roles  & Responsibilities: Yes  Barriers to Discharge: Home environment access/layout, Weight   I have spoken to both patient and his wife about the need for ramp to be built ASAP  Patient Condition: I have reviewed medical records from [facility], spoken with patient and his spouse. I met with patient at the bedside for the rehab assessment.  Please refer to consult completed by Dr. Delice Lesch on 07/24/2018 in which patient felt to benefit from an inpatient acute rehab admission.  Patient will benefit from ongoing PT and OT, can actively participate in 3 hours of therapy a day 5 days of the week, and can make measurable gains during the admission.  Patient will also benefit from the coordinated team approach during an Inpatient Acute Rehabilitation admission.  The patient will receive intensive therapy as well as Rehabilitation physician, nursing, social worker, and care management interventions.  Due to bowel management, bladder management, safety, skin/wound care, disease management,  medical administration, pain management, patient education the patient requires 24 hour a day rehabilitation nursing.  The patient is currently max assist with mobility and basic ADLs.  Discharge setting and therapy post discharge at  home with home health is anticipated.  Patient has agreed to participate in the Acute Inpatient Rehabilitation Program and will admit today.  Preadmission Screen Completed By:  Cleatrice Burke RN MSN, 08/01/2018 12:58 PM ______________________________________________________________________   Discussed status with Dr. Posey Pronto on 08/01/2018 at 1257 and received telephone approval for admission today.  Admission Coordinator:  Cleatrice Burke RN MSN time  1257 Date  08/01/2018   Cleatrice Burke RN MSN Admissions Coordinator 08/01/2018  Delice Lesch, MD, ABPMR        Revision History

## 2018-08-01 NOTE — Progress Notes (Signed)
Physical Therapy Treatment Patient Details Name: Larry Fowler MRN: [id number] DOB: 04/20/1971 Today's Date: 08/01/2018    History of Present Illness 48 y.o.malewithhistory of morbid obesity, asthma, hypertension, sleep apnea had a fall at home while walking to the bathroom. Patient slipped and fell backwards. Following which patient's knee started hurting. CAT scan done which shows bilateral quadricep tendon tear.Status bilateral repair of quadriceps tendons by Dr. Roda Shutters 07/21/2018; Bil WBAT,     PT Comments    Pt received in bed, willing to participate in therapy. Education provided to Runner, broadcasting/film/video on operation of Vital Go bed. Tilted pt to stand totalA using the Vital Go bed. Today attempted to progress pt's mobility by working on tilting forward to stand with knees locked in extension using bed for support and safety. Pt limited by pain in knees and cramping in calf and quad muscles, so instead completed several rounds of standing with bed at 75 degrees. See details below.  Attempted standing with only bledsoe braces and pt reported his knees felt like they were going to bend. Have communicated with orthotist, Tammy Sours, at [facility] about checking whether pt's bledsoe braces are sufficiently stable for him in standing/walking. He contacted Dr. Warren Danes PA, who reported to him that she will follow up with Dr. Roda Shutters, and that at this point they are comfortable with the bledsoe braces. In acute care, we have been standing with bilateral knee immobilizers around the braces.  Continue to recommend CIR; pt is motivated to participate in intensive therapy to return to his previous level of function.     Follow Up Recommendations  CIR     Equipment Recommendations  3in1 (PT);Wheelchair (measurements PT);Wheelchair cushion (measurements PT);Rolling walker with 5" wheels;Other (comment)(all bariatric equipment)    Recommendations for Other Services       Precautions / Restrictions  Precautions Precautions: Fall Precaution Comments: NO knee flexion Required Braces or Orthoses: Other Brace;Knee Immobilizer - Left;Knee Immobilizer - Right(bil bledsoe braces) Knee Immobilizer - Right: On when out of bed or walking Knee Immobilizer - Left: On when out of bed or walking Other Brace: Bilateral Bledsoe braces locked in extension reinforced with Bil KI for standing.  Restrictions Weight Bearing Restrictions: Yes RLE Weight Bearing: Weight bearing as tolerated LLE Weight Bearing: Weight bearing as tolerated Other Position/Activity Restrictions: No knee flexion    Mobility  Bed Mobility               General bed mobility comments: Able to use headboard to adjust his positioning in the bed  Transfers Overall transfer level: Needs assistance Equipment used: Rolling walker (2 wheeled) Transfers: Sit to/from Stand Sit to Stand: Total assist         General transfer comment: Used Vital Go bed to tilt to standing; today attempted standing with only bledsoe braces. He required a break at 30 degrees, then tilted to 75 degrees. Tolerated standing here for only 2 minutes 15 seconds before needing to tilt back due to calf cramping and feeling like knees were going to bend. Tilted back to stand multiple times but pt only able to tolerate about 1 min or less of standing each time.  Ambulation/Gait                 Stairs             Wheelchair Mobility    Modified Rankin (Stroke Patients Only)       Balance Overall balance assessment: Needs assistance  Standing balance-Leahy Scale: Poor Standing balance comment: Pt requires support to maintain upright                            Cognition Arousal/Alertness: Awake/alert Behavior During Therapy: WFL for tasks assessed/performed Overall Cognitive Status: Within Functional Limits for tasks assessed                                        Exercises       General Comments        Pertinent Vitals/Pain Pain Assessment: Faces Faces Pain Scale: Hurts even more Pain Location: Bil knees Pain Descriptors / Indicators: Discomfort;Sore;Sharp Pain Intervention(s): Monitored during session;Premedicated before session;Repositioned;Limited activity within patient's tolerance    Home Living                      Prior Function            PT Goals (current goals can now be found in the care plan section) Acute Rehab PT Goals Patient Stated Goal: to walk again PT Goal Formulation: With patient Time For Goal Achievement: 08/06/18 Potential to Achieve Goals: Good Progress towards PT goals: Progressing toward goals    Frequency    Min 5X/week      PT Plan Current plan remains appropriate    Co-evaluation              AM-PAC PT "6 Clicks" Mobility   Outcome Measure  Help needed turning from your back to your side while in a flat bed without using bedrails?: A Little Help needed moving from lying on your back to sitting on the side of a flat bed without using bedrails?: A Lot Help needed moving to and from a bed to a chair (including a wheelchair)?: Total Help needed standing up from a chair using your arms (e.g., wheelchair or bedside chair)?: Total Help needed to walk in hospital room?: Total Help needed climbing 3-5 steps with a railing? : Total 6 Click Score: 9    End of Session Equipment Utilized During Treatment: Other (comment)(bilateral bledsoe braces locked in extension) Activity Tolerance: Patient limited by pain;Patient tolerated treatment well Patient left: in bed;with call bell/phone within reach Nurse Communication: Mobility status PT Visit Diagnosis: Other abnormalities of gait and mobility (R26.89);Pain;Difficulty in walking, not elsewhere classified (R26.2) Pain - Right/Left: (bilateral) Pain - part of body: Knee     Time: 1132-1229 PT Time Calculation (min) (ACUTE ONLY): 57 min  Charges:   $Therapeutic Activity: 53-67 mins                     Halina Andreas, SPT    Halina Andreas 08/01/2018, 12:55 PM

## 2018-08-01 NOTE — H&P (Signed)
Physical Medicine and Rehabilitation Admission H&P    Chief Complaint  Patient presents with  . Joint Swelling  : HPI: Larry Fowler is a 48 year old right handed male with history of OSA-CPAP,tobacco abuse, anxiety/depression, asthma, super morbid obesity with BMI 64. Per report and patient, patient lives with spouse. 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 One level home with 2 steps to entry. Presented 07/20/2018 after slipping and falling backwards with acute onset of bilateral knee pain. CT of the knees done revealing bilateral quad tendon rupture and underwent repair on 07/21/2018 by Dr.Xu. Postoperative weightbearing as tolerated with bilateral Bledsoe knee braces is locked in extension. Ongoing issues of pain management currently maintained on scheduled OxyContin as well as oxycodone immediate release for breakthrough pain. Acute blood loss anemia 7.8 was transfused 1 unit packed red blood cells with latest hemoglobin maintaining stable at 7.8.patient had been on subcutaneous .Currently maintained on Lovenox for DVT prophylaxis. Findings of elevated hemoglobin A1c of 7.1 and blood sugars monitored. Placed on Augmentin after patient spiked low-grade fevers findings of acute otitis media.Therapy evaluations completed with recommendations of physical medicine rehabilitation consult. Patient was admitted for a comprehensive rehabilitation program.  Review of Systems  Constitutional: Negative for chills and fever.  HENT: Negative for hearing loss.   Eyes: Negative for blurred vision and double vision.  Respiratory: Negative for cough.        Shortness of breath with exertion  Cardiovascular: Positive for leg swelling. Negative for chest pain.  Gastrointestinal: Positive for constipation. Negative for nausea and vomiting.  Genitourinary: Negative for dysuria, flank pain and hematuria.  Musculoskeletal: Positive for joint pain and myalgias.  Skin: Negative for rash.    Neurological: Positive for weakness.  Psychiatric/Behavioral: Positive for depression.       Anxiety  All other systems reviewed and are negative.  Past Medical History:  Diagnosis Date  . ALLERGIC RHINITIS 09/12/2007   Qualifier: Diagnosis of  By: Jonny Ruiz MD, Len Blalock   . ANXIETY 02/16/2007   Qualifier: Diagnosis of  By: Tyrone Apple, Lucy    . ASTHMA 12/17/2009   Qualifier: Diagnosis of  By: Jonny Ruiz MD, Len Blalock   . Cannabis abuse    currently  . Chills with fever   . DEPRESSION 09/12/2007   Qualifier: Diagnosis of  By: Jonny Ruiz MD, Len Blalock   . History of cocaine use    in his 66's  . Hyperglycemia   . Rectal fistula 2009  . Rectal pain   . Renal stone    2008   Past Surgical History:  Procedure Laterality Date  . FINGER SURGERY  2008/09?   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 knee surgury     ? which knee per Dr Thurston Hole in his teens  . REPAIR QUADRICEPS/HAMSTRING MUSCLES Bilateral 07/21/2018   Procedure: REPAIR BILATERAL QUADRICEPS;  Surgeon: Tarry Kos, MD;  Location: [facility] OR;  Service: Orthopedics;  Laterality: Bilateral;  . TONSILLECTOMY     Family History  Problem Relation Age of Onset  . Diabetes type II Mother   . Pneumonia Father    Social History:  reports that he has been smoking. He has been smoking about 0.25 packs per day. He has never used smokeless tobacco. He reports current alcohol use. He reports current drug use. Drug: Marijuana. Allergies: No Known Allergies Medications Prior to Admission  Medication Sig Dispense Refill  . albuterol (VENTOLIN  HFA) 108 (90 Base) MCG/ACT inhaler Inhale 2 puffs into the lungs every 6 (six) hours as needed for wheezing. 1 Inhaler 11  . budesonide-formoterol (SYMBICORT) 80-4.5 MCG/ACT inhaler Inhale 2 puffs into the lungs 2 (two) times daily. 1 Inhaler 6  . Diclofenac Sodium 2 % SOLN Place 2 g onto the skin 2 (two) times daily. 112 g 3  . losartan (COZAAR) 25 MG tablet Take 1  tablet (25 mg total) by mouth 2 (two) times daily. 180 tablet 3  . naproxen (NAPROSYN) 500 MG tablet TAKE 1 TABLET BY MOUTH TWICE DAILY WITH A MEAL (Patient taking differently: Take 500 mg by mouth 2 (two) times daily with a meal. ) 100 tablet 1    Drug Regimen Review  Drug regimen was reviewed and remains appropriate with no significant issues identified  Home: Home Living Family/patient expects to be discharged to:: Private residence Living Arrangements: Spouse/significant other Available Help at Discharge: (wife works days at [employer] as Associate Professor) Type of Home: [employer] Home Access: Stairs to enter Secretary/administrator of Steps: 3 Entrance Stairs-Rails: None Home Layout: One level Bathroom Shower/Tub: Engineer, manufacturing systems: Standard Home Equipment: None  Lives With: Spouse   Functional History: Prior Function Level of Independence: Independent Comments: owns his own business, He is a Research scientist (medical); 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 Status:  Mobility: Bed Mobility Overal bed mobility: Needs Assistance Bed Mobility: Rolling Rolling: +2 for physical assistance, Min assist Supine to sit: +2 for physical assistance, Mod assist Sit to supine: Mod assist, +2 for physical assistance(+3) General bed mobility comments: [name] does a great job using the headboard to pull self up in the bed Transfers Overall transfer level: Needs assistance Equipment used: Rolling walker (2 wheeled) Transfer via Lift Equipment: (Vital go total lift bed) Transfers: Sit to/from Stand Sit to Stand: Total assist Anterior-Posterior transfers: +2 physical assistance, Max assist, +2 safety/equipment(and 2 others supporting LEs) General transfer comment: Employed Vital Go bed to tilt to standing; initial break at 30 degrees, and then tilted to 75 degrees where [name] was able to tolerate standing for 13.5 min, perform weight shifting and moving legs (all 3 straps loosened).  Amazing progress and motivation Ambulation/Gait General Gait Details: Initiated pre-gait activity, standing with Total Lift bed; straps loosened to allow for movement once [name] was comfortable standing; weight shifted to L enough to allow for very short R step backwards; difficulty with weight shift R to unweigh LLE for stepping forward or back    ADL: ADL Overall ADL's : Needs assistance/impaired Eating/Feeding: Set up, Sitting, Bed level Grooming: Set up, Wash/dry face, Standing Grooming Details (indicate cue type and reason): on tilt bed, no UE support Upper Body Bathing: Moderate assistance Upper Body Bathing Details (indicate cue type and reason): for back Lower Body Bathing: Maximal assistance, Bed level Upper Body Dressing : Minimal assistance, Sitting Lower Body Dressing: Total assistance, Bed level Lower Body Dressing Details (indicate cue type and reason): to don shoes Toilet Transfer: Maximal assistance, +2 for physical assistance, +2 for safety/equipment, Requires wide/bariatric Toilet Transfer Details (indicate cue type and reason): continue bed pan for now Toileting- Clothing Manipulation and Hygiene: Total assistance, Bed level Toileting - Clothing Manipulation Details (indicate cue type and reason): Pt assists with rolling as able General ADL Comments: progressing towards tolerating standing for grooming tasks  Cognition: Cognition Overall Cognitive Status: Within Functional Limits for tasks assessed Orientation Level: Oriented X4 Cognition Arousal/Alertness: Awake/alert Behavior During Therapy: WFL for tasks assessed/performed  Overall Cognitive Status: Within Functional Limits for tasks assessed  Physical Exam: Blood pressure (!) 117/56, pulse 64, temperature 98.4 F (36.9 C), temperature source Oral, resp. rate 16, height 6\' 2"  (1.88 m), weight (!) 226.8 kg, SpO2 100 %. Physical Exam  Vitals reviewed. Constitutional: He is oriented to person, place, and time.  He appears well-developed.  48 year old right-handed morbidly obese male  HENT:  Head: Normocephalic and atraumatic.  Eyes: EOM are normal. Right eye exhibits no discharge. Left eye exhibits no discharge.  Neck: Normal range of motion. Neck supple. No thyromegaly present.  Cardiovascular: Normal rate and regular rhythm.  Respiratory: Effort normal and breath sounds normal.  GI: Soft. Bowel sounds are normal. He exhibits no distension.  Musculoskeletal:     Comments: LE edema and tenderness  Neurological: He is alert and oriented to person, place, and time.  Motor: Bilateral upper extremities: 5/5 proximal to distal Bilateral lower extremities: Flexion 2/5, knee extension locked in brace, ankle dorsiflexion 5/5  Skin:  Bilateral Bledsoe braces in place with dressing C/D/I  Psychiatric: He has a normal mood and affect. His behavior is normal.    Results for orders placed or performed during the hospital encounter of 07/20/18 (from the past 48 hour(s))  Glucose, capillary     Status: Abnormal   Collection Time: 07/30/18  5:04 PM  Result Value Ref Range   Glucose-Capillary 126 (H) 70 - 99 mg/dL  Glucose, capillary     Status: Abnormal   Collection Time: 07/30/18  9:11 PM  Result Value Ref Range   Glucose-Capillary 117 (H) 70 - 99 mg/dL  Glucose, capillary     Status: Abnormal   Collection Time: 07/31/18  7:34 AM  Result Value Ref Range   Glucose-Capillary 136 (H) 70 - 99 mg/dL  Glucose, capillary     Status: Abnormal   Collection Time: 07/31/18 12:58 PM  Result Value Ref Range   Glucose-Capillary 140 (H) 70 - 99 mg/dL  Glucose, capillary     Status: Abnormal   Collection Time: 07/31/18  4:53 PM  Result Value Ref Range   Glucose-Capillary 132 (H) 70 - 99 mg/dL  Glucose, capillary     Status: Abnormal   Collection Time: 07/31/18  9:13 PM  Result Value Ref Range   Glucose-Capillary 148 (H) 70 - 99 mg/dL  Glucose, capillary     Status: Abnormal   Collection Time: 08/01/18  6:37  AM  Result Value Ref Range   Glucose-Capillary 143 (H) 70 - 99 mg/dL  Glucose, capillary     Status: Abnormal   Collection Time: 08/01/18 11:34 AM  Result Value Ref Range   Glucose-Capillary 136 (H) 70 - 99 mg/dL   No results found.     Medical Problem List and Plan: 1.  Decreased functional mobility secondary to bilateral quadricep tendon rupture. Status post repair of bilateral tendon rupture repair 07/21/2018. Weightbearing as tolerated. Bilateral Bledsoe brace locked in extension  Admitted to CIR 2.  Antithrombotics: -DVT/anticoagulation:  Subcutaneous Lovenox 110 mg daily. Check lower extremity vascular study  -antiplatelet therapy: Not applicable 3. Pain Management:  Lidoderm patch,OxyContin sustained release 10 mg every 12 hours, oxycodone immediate release for breakthrough pain 4. Mood:  Zoloft 25 mg daily. Provide emotional support  -antipsychotic agents:  Not applicable 5. Neuropsych: This patient is capable of making decisions on his own behalf. 6. Skin/Wound Care: Stage I pressure ulcer. Follow-up wound care nurse Routine skin checks 7. Fluids/Electrolytes/Nutrition:  Routine interval mouth with follow-up chemistries in a.m.  8. Acute blood loss anemia. Transfused 1 unit packed red blood cells postoperatively. Follow-up CBC in a.m. 9.  Super super obesity. Follow-up dietary services 10. New findingsType 2 diabetes mellitus. Hemoglobin A1c 7.1. Glucophage 500 mg daily Check blood sugars before meals and at bedtime. Diabetic teaching 11. Acute otitis media. Augmentin 875-125 mg initiated 07/30/2018. 12. Constipation. Laxative assistance 13. Asthma/OSA/tobacco/ marijuana abuse. CPAP as directed.Continue Symbicort as well as nebulizer treatments. Provide counseling regards to tobacco abuse  Post Admission Physician Evaluation: 1. Preadmission assessment reviewed and changes made below. 2. Functional deficits secondary  to bilateral quadriceps tendon rupture status post  repair. 3. Patient is admitted to receive collaborative, interdisciplinary care between the physiatrist, rehab nursing staff, and therapy team. 4. Patient's level of medical complexity and substantial therapy needs in context of that medical necessity cannot be provided at a lesser intensity of care such as a SNF. 5. Patient has experienced substantial functional loss from his/her baseline which was documented above under the "Functional History" and "Functional Status" headings.  Judging by the patient's diagnosis, physical exam, and functional history, the patient has potential for functional progress which will result in measurable gains while on inpatient rehab.  These gains will be of substantial and practical use upon discharge  in facilitating mobility and self-care at the household level. 6. Physiatrist will provide 24 hour management of medical needs as well as oversight of the therapy plan/treatment and provide guidance as appropriate regarding the interaction of the two. 7. 24 hour rehab nursing will assist with bowel management, safety, skin/wound care, disease management, pain management and patient education  and help integrate therapy concepts, techniques,education, etc. 8. PT will assess and treat for/with: Lower extremity strength, range of motion, stamina, balance, functional mobility, safety, adaptive techniques and equipment, wound care, coping skills, pain control, education. Goals are: Mod A. 9. OT will assess and treat for/with: ADL's, functional mobility, safety, upper extremity strength, adaptive techniques and equipment, wound mgt, ego support, and community reintegration.   Goals are: Mod A. Therapy may not proceed with showering this patient. 10. Case Management and Social Worker will assess and treat for psychological issues and discharge planning. 11. Team conference will be held weekly to assess progress toward goals and to determine barriers to discharge. 12. Patient will  receive at least 3 hours of therapy per day at least 5 days per week. 13. ELOS: 20-24 days.       14. Prognosis:  good  I have personally performed a face to face diagnostic evaluation, including, but not limited to relevant history and physical exam findings, of this patient and developed relevant assessment and plan.  Additionally, I have reviewed and concur with the physician assistant's documentation above.  Maryla Morrow, MD, ABPMR Mcarthur Rossetti Angiulli, PA-C 08/01/2018  Maryla Morrow, MD, ABPMR

## 2018-08-01 NOTE — Progress Notes (Signed)
Inpatient Rehabilitation Admissions Coordinator  I have insurance approval and bed available to admit pt to today. Pt is in agreement. I have alerted RN CM and Dr. Lafe Garin. I will make the arrangements to admit today.  Ottie Glazier, RN, MSN Rehab Admissions Coordinator [phone] 08/01/2018 12:52 PM

## 2018-08-02 ENCOUNTER — Inpatient Hospital Stay (HOSPITAL_COMMUNITY): Payer: Self-pay | Admitting: Occupational Therapy

## 2018-08-02 ENCOUNTER — Inpatient Hospital Stay (HOSPITAL_COMMUNITY): Payer: Self-pay | Admitting: Physical Therapy

## 2018-08-02 ENCOUNTER — Telehealth: Payer: Self-pay | Admitting: *Deleted

## 2018-08-02 ENCOUNTER — Inpatient Hospital Stay (HOSPITAL_COMMUNITY): Payer: No Typology Code available for payment source

## 2018-08-02 DIAGNOSIS — D62 Acute posthemorrhagic anemia: Secondary | ICD-10-CM

## 2018-08-02 DIAGNOSIS — H6690 Otitis media, unspecified, unspecified ear: Secondary | ICD-10-CM

## 2018-08-02 DIAGNOSIS — E1165 Type 2 diabetes mellitus with hyperglycemia: Secondary | ICD-10-CM

## 2018-08-02 DIAGNOSIS — G8918 Other acute postprocedural pain: Secondary | ICD-10-CM

## 2018-08-02 DIAGNOSIS — M7989 Other specified soft tissue disorders: Secondary | ICD-10-CM

## 2018-08-02 DIAGNOSIS — E46 Unspecified protein-calorie malnutrition: Secondary | ICD-10-CM

## 2018-08-02 DIAGNOSIS — E8809 Other disorders of plasma-protein metabolism, not elsewhere classified: Secondary | ICD-10-CM

## 2018-08-02 DIAGNOSIS — S76119D Strain of unspecified quadriceps muscle, fascia and tendon, subsequent encounter: Secondary | ICD-10-CM

## 2018-08-02 DIAGNOSIS — E119 Type 2 diabetes mellitus without complications: Secondary | ICD-10-CM

## 2018-08-02 LAB — COMPREHENSIVE METABOLIC PANEL
ALT: 26 U/L (ref 0–44)
AST: 29 U/L (ref 15–41)
Albumin: 2.7 g/dL — ABNORMAL LOW (ref 3.5–5.0)
Alkaline Phosphatase: 54 U/L (ref 38–126)
Anion gap: 8 (ref 5–15)
BUN: 11 mg/dL (ref 6–20)
CO2: 27 mmol/L (ref 22–32)
Calcium: 8.3 mg/dL — ABNORMAL LOW (ref 8.9–10.3)
Chloride: 102 mmol/L (ref 98–111)
Creatinine, Ser: 0.63 mg/dL (ref 0.61–1.24)
GFR calc Af Amer: >60 mL/min (ref >60)
GFR calc non Af Amer: >60 mL/min (ref >60)
Glucose, Bld: 145 mg/dL — ABNORMAL HIGH (ref 70–99)
POTASSIUM: 4 mmol/L (ref 3.5–5.1)
Sodium: 137 mmol/L (ref 135–145)
Total Bilirubin: 0.8 mg/dL (ref 0.3–1.2)
Total Protein: 5.4 g/dL — ABNORMAL LOW (ref 6.5–8.1)

## 2018-08-02 LAB — CBC WITH DIFFERENTIAL/PLATELET
Abs Immature Granulocytes: 0.06 10*3/uL (ref 0.00–0.07)
Basophils Absolute: 0 10*3/uL (ref 0.0–0.1)
Basophils Relative: 0 %
EOS ABS: 0.3 10*3/uL (ref 0.0–0.5)
Eosinophils Relative: 3 %
HEMATOCRIT: 27.8 % — AB (ref 39.0–52.0)
Hemoglobin: 8.4 g/dL — ABNORMAL LOW (ref 13.0–17.0)
Immature Granulocytes: 1 %
LYMPHS ABS: 2.6 10*3/uL (ref 0.7–4.0)
Lymphocytes Relative: 34 %
MCH: 29.9 pg (ref 26.0–34.0)
MCHC: 30.2 g/dL (ref 30.0–36.0)
MCV: 98.9 fL (ref 80.0–100.0)
Monocytes Absolute: 0.6 10*3/uL (ref 0.1–1.0)
Monocytes Relative: 7 %
Neutro Abs: 4.2 10*3/uL (ref 1.7–7.7)
Neutrophils Relative %: 55 %
Platelets: 178 10*3/uL (ref 150–400)
RBC: 2.81 MIL/uL — ABNORMAL LOW (ref 4.22–5.81)
RDW: 15.6 % — ABNORMAL HIGH (ref 11.5–15.5)
WBC: 7.7 10*3/uL (ref 4.0–10.5)
nRBC: 0 % (ref 0.0–0.2)

## 2018-08-02 LAB — GLUCOSE, CAPILLARY
GLUCOSE-CAPILLARY: 137 mg/dL — AB (ref 70–99)
Glucose-Capillary: 147 mg/dL — ABNORMAL HIGH (ref 70–99)
Glucose-Capillary: 113 mg/dL — ABNORMAL HIGH (ref 70–99)
Glucose-Capillary: 177 mg/dL — ABNORMAL HIGH (ref 70–99)
Glucose-Capillary: 129 mg/dL — ABNORMAL HIGH (ref 70–99)

## 2018-08-02 MED ORDER — ADULT MULTIVITAMIN W/MINERALS CH
1.0000 | ORAL_TABLET | Freq: Every day | ORAL | Status: DC
  Administered 2018-08-02 – 2018-08-30 (×29): 1 via ORAL
  Filled 2018-08-02 (×29): qty 1

## 2018-08-02 MED ORDER — PRO-STAT SUGAR FREE PO LIQD
30.0000 mL | Freq: Two times a day (BID) | ORAL | Status: DC
  Administered 2018-08-02 – 2018-08-09 (×5): 30 mL via ORAL
  Filled 2018-08-02 (×11): qty 30

## 2018-08-02 NOTE — Progress Notes (Signed)
Patient has home CPAP @bedside  and self administers.

## 2018-08-02 NOTE — Evaluation (Signed)
Physical Therapy Assessment and Plan  Patient Details  Name: Larry Fowler MRN: [id number] Date of Birth: 1970-11-08  PT Diagnosis: Difficulty walking, Muscle weakness and Pain in joint Rehab Potential: Fair ELOS: 2-3 weeks    Today's Date: 08/02/2018 PT Individual Time: 800-915    75 min   Problem List:  Patient Active Problem List   Diagnosis Date Noted  . Marijuana abuse   . Drug induced constipation   . Otitis media   . Acute blood loss anemia   . Pressure injury of skin 07/30/2018  . Anxiety and depression   . Super-super obese ([place])   . Postoperative pain   . Tobacco abuse   . Postoperative anemia due to acute blood loss 07/23/2018    Class: Acute  . Rupture of left quadriceps muscle   . Quadriceps tendon rupture 07/20/2018  . Nutritional counseling 07/03/2018  . Disorder of tendon of left biceps 12/05/2017  . Hypogonadism in male 10/25/2017  . Hyperglycemia   . Preventative health care 03/21/2017  . Morbid obesity ([place]) 03/21/2017  . OSA (obstructive sleep apnea) 03/21/2017  . Chronic pain 03/21/2017  . Smoker 03/21/2017  . HTN (hypertension) 03/21/2017  . Fatigue 03/21/2017  . Left inguinal hernia 03/21/2017  . Rash 03/21/2017  . History of cocaine use   . Cannabis abuse   . Renal stone   . Anal abscess, ? fistula 04/05/2011  . Asthma 12/17/2009  . 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 DEPRESSION 09/12/2007  . Allergic rhinitis 09/12/2007  . ANXIETY 02/16/2007    Past Medical History:  Past Medical History:  Diagnosis Date  . ALLERGIC RHINITIS 09/12/2007   Qualifier: Diagnosis of  By: Jenny Reichmann MD, Hunt Oris   . ANXIETY 02/16/2007   Qualifier: Diagnosis of  By: Reatha Armour, Lucy    . ASTHMA 12/17/2009   Qualifier: Diagnosis of  By: Jenny Reichmann MD, Hunt Oris   . Cannabis abuse    currently  . Chills with fever   . DEPRESSION 09/12/2007   Qualifier: Diagnosis of  By: Jenny Reichmann MD, Hunt Oris   . History of cocaine use    in his 7's  . Hyperglycemia   . Rectal  fistula 2009  . Rectal pain   . Renal stone    2008   Past Surgical History:  Past Surgical History:  Procedure Laterality Date  . FINGER SURGERY  2008/09?   partial amputation of 3rd finger right hand, and reattachment of right index finger - Dr Sharol Given  . knee surgury     ? which knee per Dr Noemi Chapel in his teens  . REPAIR QUADRICEPS/HAMSTRING MUSCLES Bilateral 07/21/2018   Procedure: REPAIR BILATERAL QUADRICEPS;  Surgeon: Leandrew Koyanagi, MD;  Location: [facility];  Service: Orthopedics;  Laterality: Bilateral;  . TONSILLECTOMY      Assessment & Plan Clinical Impression: Patient is a 48 year old right handed male with history of OSA-CPAP,tobacco abuse, anxiety/depression, asthma, super morbid obesity with BMI 64. Per report and patient, patient lives with spouse. 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 One level home with 2 steps to entry. Presented 07/20/2018 after slipping and falling backwards with acute onset of bilateral knee pain. CT of the knees done revealing bilateral quad tendon rupture and underwent repair on 07/21/2018 by Dr.Xu. Postoperative weightbearing as tolerated with bilateral Bledsoe knee braces is locked in extension. Ongoing issues of pain management currently maintained on scheduled OxyContin as well as oxycodone immediate release for breakthrough pain. Acute blood loss anemia 7.8  was transfused 1 unit packed red blood cells with latest hemoglobin maintaining stable at 7.8.patient had been on subcutaneous .  Patient transferred to CIR on 08/01/2018 .   Patient currently requires total with mobility secondary to muscle weakness and muscle joint tightness, decreased cardiorespiratoy endurance and decreased sitting balance, decreased standing balance, decreased balance strategies and difficulty maintaining precautions.  Prior to hospitalization, patient was independent  with mobility and lived with Spouse in a House home.  Home access is  Stairs to enter.  Patient will  benefit from skilled PT intervention to maximize safe functional mobility, minimize fall risk and decrease caregiver burden for planned discharge home with 24 hour assist.  Anticipate patient will benefit from follow up [facility] at discharge.     Skilled Therapeutic Intervention Pt received supine in bed and agreeable to PT. PT instructed patient in PT Evaluation and initiated treatment intervention; see below for results. PT educated patient in [place], rehab potential, rehab goals, and discharge recommendations. Supine>stand at 30 deg and progressed to 60 deg in TIS Bed. Pt reports dizziness at 30 and 60 degrees, no significant change in vital signs with change in position. Pt left in bed with call bell in reach and all needs met    PT Evaluation Precautions/Restrictions   WBAT BLE with BLE in extension .  General   Vital SignsTherapy Vitals Temp: 98.9 F (37.2 C) Temp Source: Oral Pulse Rate: 68 Resp: 16 BP: 128/65 Patient Position (if appropriate): Lying Oxygen Therapy SpO2: 99 % O2 Device: Room Air Pain Pain Assessment Pain Scale: 0-10 Pain Score: 8  Pain Type: Surgical pain Pain Location: Ankle Pain Orientation: Right;Left Pain Descriptors / Indicators: Aching;Discomfort Pain Frequency: Intermittent Pain Onset: Gradual Pain Intervention(s): Medication (See eMAR) Home Living/Prior Functioning Home Living Available Help at Discharge: Family;Available PRN/intermittently Type of Home: House Home Access: Stairs to enter [employer] of Steps: 2 + 1  Entrance Stairs-Rails: None Home Layout: One level Bathroom Shower/Tub: Chiropodist: Standard  Lives With: Spouse Prior Function Level of Independence: Independent with basic ADLs  Able to Take Stairs?: Yes Driving: Yes Vocation: Full time employment Vision/Perception  Perception Perception: Within Functional Limits Praxis Praxis: Intact  Cognition Overall Cognitive Status: Within Functional  Limits for tasks assessed Arousal/Alertness: Awake/alert Attention: Sustained Memory: Appears intact Awareness: Appears intact Problem Solving: Appears intact Safety/Judgment: Appears intact Sensation Sensation Light Touch: Appears Intact Hot/Cold: Appears Intact Proprioception: Appears Intact Stereognosis: Appears Intact Additional Comments: sensation intact in BUEs Coordination Gross Motor Movements are Fluid and Coordinated: Yes Fine Motor Movements are Fluid and Coordinated: Yes Motor  Motor Motor: Within Functional Limits Motor - Skilled Clinical Observations: LE movement limited by pain  Mobility Bed Mobility Bed Mobility: Rolling Right;Rolling Left Rolling Right: Minimal Assistance - Patient > 75% Rolling Left: Minimal Assistance - Patient > 75% Transfers Transfers: Sit to Stand Sit to Stand: Total Assistance - Patient < 25% Transfer (Assistive device): Other (Comment)(tilt bed ) Locomotion  Gait Ambulation: No Gait Gait: No Stairs / Additional Locomotion Stairs: No Wheelchair Mobility Wheelchair Mobility: No  Trunk/Postural Assessment  Cervical Assessment Cervical Assessment: Within Functional Limits Thoracic Assessment Thoracic Assessment: Within Functional Limits Lumbar Assessment Lumbar Assessment: Within Functional Limits Postural Control Postural Control: Within Functional Limits  Balance Balance Balance Assessed: Yes Static Standing Balance Static Standing - Level of Assistance: 1: +1 Total assist(supported with 3 straps on lift bed into standing at ~60 deg ) Extremity Assessment      RLE Assessment RLE Assessment: Exceptions  to [facility] General Strength Comments: grossly 4/5 at hip. BLE locked in extension  LLE Assessment LLE Assessment: Exceptions to [facility] General Strength Comments: grossly 4/5 at hip. BLE locked in extension     Refer to Care Plan for Long Term Goals  Recommendations for other services: Neuropsych  Discharge Criteria:  Patient will be discharged from PT if patient refuses treatment 3 consecutive times without medical reason, if treatment goals not met, if there is a change in medical status, if patient makes no progress towards goals or if patient is discharged from hospital.  The above assessment, treatment plan, treatment alternatives and goals were discussed and mutually agreed upon: by patient  Lorie Phenix 08/02/2018, 7:57 AM

## 2018-08-02 NOTE — Progress Notes (Signed)
Patient information reviewed and entered into eRehab System by Becky Leinani Lisbon, PPS coordinator. Information including medical coding, function ability, and quality indicators will be reviewed and updated through discharge.   

## 2018-08-02 NOTE — Consult Note (Signed)
WOC Nurse wound consult note Patient receiving care in [facility] [phone].  No family present. Reason for Consult: Buttock and posterior thigh areas Wound type: The right buttock has a DTPI that measures 1 cm x 2 cm is 100% maroon, overlying tissue is intact.  I do not see any further wound issues anywhere else on either buttocks, or the posterior thighs.  The patient has been instructed to turn every 1.5 hours instead of the every 3 hours he states he has been doing. Pressure Injury POA: No Measurement: 1 cm x 2 cm Wound bed: see above Drainage (amount, consistency, odor) no drainage, no odor, no induration Periwound: intact, normal color skin Dressing procedure/placement/frequency: No dressing needed at this time, just careful monitoring for deterioration. Monitor the wound area(s) for worsening of condition such as: Signs/symptoms of infection,  Increase in size,  Development of or worsening of odor, Development of pain, or increased pain at the affected locations.  Notify the medical team if any of these develop.  Thank you for the consult.  Discussed plan of care with the patient and bedside nurse.  Helmut Muster, RN, MSN, CWOCN, CNS-BC, pager [phone]     d

## 2018-08-02 NOTE — Progress Notes (Signed)
Pt has home CPAP @ bedside and self administers.

## 2018-08-02 NOTE — Telephone Encounter (Signed)
Pt was on TCM report was admitted 07/20/18 for bilateral knee pain, was found to have bilateral quadricep tendon tear.  Orthopedic surgery was consulted and patient was taken to the operating room on 2/14 and underwent repair. Pt was D/C 08/01/18 and transferred to CIR for further inpatient rehabilitation. Per summary will need to F/U with PCP 1 weeks after leaving CIR.Marland KitchenRaechel Chute

## 2018-08-02 NOTE — Evaluation (Signed)
Occupational Therapy Assessment and Plan  Patient Details  Name: Larry Fowler MRN: [id number] Date of Birth: November 30, 1970  OT Diagnosis: abnormal posture, acute pain, muscle weakness (generalized) and pain in joint Rehab Potential: Rehab Potential (ACUTE ONLY): Good ELOS: 19-21 days   Today's Date: 08/02/2018 OT Individual Time: 1008-1101 OT Individual Time Calculation (min): 53 min     Problem List:  Patient Active Problem List   Diagnosis Date Noted  . Hypoalbuminemia due to protein-calorie malnutrition ([name])   . New onset type 2 diabetes mellitus ([name])   . Marijuana abuse   . Drug induced constipation   . Otitis media   . Acute blood loss anemia   . Pressure injury of skin 07/30/2018  . Anxiety and depression   . Super-super obese ([name])   . Postoperative pain   . Tobacco abuse   . Postoperative anemia due to acute blood loss 07/23/2018    Class: Acute  . Rupture of left quadriceps muscle   . Quadriceps tendon rupture 07/20/2018  . Nutritional counseling 07/03/2018  . Disorder of tendon of left biceps 12/05/2017  . Hypogonadism in male 10/25/2017  . Hyperglycemia   . Preventative health care 03/21/2017  . Morbid obesity ([name]) 03/21/2017  . OSA (obstructive sleep apnea) 03/21/2017  . Chronic pain 03/21/2017  . Smoker 03/21/2017  . HTN (hypertension) 03/21/2017  . Fatigue 03/21/2017  . Left inguinal hernia 03/21/2017  . Rash 03/21/2017  . History of cocaine use   . Cannabis abuse   . Renal stone   . Anal abscess, ? fistula 04/05/2011  . Asthma 12/17/2009  . 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 DEPRESSION 09/12/2007  . Allergic rhinitis 09/12/2007  . ANXIETY 02/16/2007    Past Medical History:  Past Medical History:  Diagnosis Date  . ALLERGIC RHINITIS 09/12/2007   Qualifier: Diagnosis of  By: Jenny Reichmann MD, Hunt Oris   . ANXIETY 02/16/2007   Qualifier: Diagnosis of  By: Reatha Armour, Lucy    . ASTHMA 12/17/2009   Qualifier: Diagnosis of  By: Jenny Reichmann MD, Hunt Oris    . Cannabis abuse    currently  . Chills with fever   . DEPRESSION 09/12/2007   Qualifier: Diagnosis of  By: Jenny Reichmann MD, Hunt Oris   . History of cocaine use    in his 59's  . Hyperglycemia   . Rectal fistula 2009  . Rectal pain   . Renal stone    2008   Past Surgical History:  Past Surgical History:  Procedure Laterality Date  . FINGER SURGERY  2008/09?   partial amputation of 3rd finger right hand, and reattachment of right index finger - Dr Sharol Given  . knee surgury     ? which knee per Dr Noemi Chapel in his teens  . REPAIR QUADRICEPS/HAMSTRING MUSCLES Bilateral 07/21/2018   Procedure: REPAIR BILATERAL QUADRICEPS;  Surgeon: Leandrew Koyanagi, MD;  Location: [facility];  Service: Orthopedics;  Laterality: Bilateral;  . TONSILLECTOMY      Assessment & Plan Clinical Impression: Patient is a 48 y.o. year old male with recent admission to the hospital on 07/20/2018 after slipping and falling backwards with acute onset of bilateral knee pain. CT of the knees done revealing bilateral quad tendon rupture and underwent repair on 07/21/2018 by Dr.Xu. Postoperative weightbearing as tolerated with bilateral Bledsoe knee braces is locked in extension.  Patient transferred to CIR on 08/01/2018 .    Patient currently requires total with basic self-care skills secondary to muscle weakness and decreased sitting  balance, decreased standing balance and decreased balance strategies.  Prior to hospitalization, patient could complete ADLs with independent .  Patient will benefit from skilled intervention to decrease level of assist with basic self-care skills and increase independence with basic self-care skills prior to discharge home with care partner.  Anticipate patient will require 24 hour supervision and follow up home health.  OT - End of Session Activity Tolerance: Decreased this session Endurance Deficit: Yes OT Assessment Rehab Potential (ACUTE ONLY): Good OT Barriers to Discharge: Home environment access/layout OT  Barriers to Discharge Comments: Pt with locked out hinged knee braces.  Will need have higher surfaces to transfer to for success with toilet and wheelchair transfers.   OT Patient demonstrates impairments in the following area(s): Balance;Endurance;Pain OT Basic ADL's Functional Problem(s): Grooming;Bathing;Dressing;Toileting OT Transfers Functional Problem(s): Toilet;Tub/Shower OT Additional Impairment(s): None OT Plan OT Intensity: Minimum of 1-2 x/day, 45 to 90 minutes OT Frequency: 5 out of 7 days OT Duration/Estimated Length of Stay: 19-21 days OT Treatment/Interventions: Balance/vestibular training;Discharge planning;DME/adaptive equipment instruction;Functional mobility training;Pain management;Psychosocial support;Therapeutic Activities;UE/LE Strength taining/ROM;UE/LE Coordination activities;Therapeutic Exercise;Self Care/advanced ADL retraining;Patient/family education;Community reintegration;Wheelchair propulsion/positioning OT Self Feeding Anticipated Outcome(s): independent OT Basic Self-Care Anticipated Outcome(s): supervision to min assist OT Toileting Anticipated Outcome(s): min assist level OT Bathroom Transfers Anticipated Outcome(s): min assist level OT Recommendation Follow Up Recommendations: Home health OT;24 hour supervision/assistance Equipment Recommended: 3 in 1 bedside comode;Tub/shower bench(heavy duty bariatric)   Skilled Therapeutic Intervention Began work on self care retraining in supine position during session.  He was able to wash his face, arms, chest, and part of his abdomen with setup.  Therapist assisted with washing his back in sidelying, which he was able to achieve position with min assist using the rail for support.  Total assist for washing front and peri area as well as under abdominal skin fold.  Total assist also needed for removal of gripper socks and for washing his feet.  Pt donned gown this session but educated on the need for donning shirt and  shorts as this will be what he has to manage at home as well.  He will have his wife bring in more to wear.  Pt left in supine position with HOB slightly elevated and call button and phone in reach.    Second Session [phone])  Pt worked on upright tolerance in the bariatric bed with strapping of his chest and hips.  Therapist donned shoes with total assist and pt in supine positioned.  He was able to tolerate short sessions of 2 mins with elevation of bed at 50-59 degrees.  Once this was increased up to 70 degrees, he could only tolerate for approximately 1 minute secondary to quad pain and calf pain.  Completed several intervals of this with angle ranging from 59-70 and pt using his BUES supported on the bed rails.  Finished session with pt back in supine position and call button and phone in reach with lunch present for pt to eat.      OT Evaluation Precautions/Restrictions  Precautions Precautions: Fall Required Braces or Orthoses: Other Brace(Bledsoe braces bilaterally locked into 0 extension) Knee Immobilizer - Right: On at all times Knee Immobilizer - Left: On at all times Other Brace: Bilateral Bledsoe braces locked in extension Restrictions Weight Bearing Restrictions: No RLE Weight Bearing: Weight bearing as tolerated LLE Weight Bearing: Weight bearing as tolerated  Pain Pain Assessment Pain Scale: 0-10 Pain Score: 6  Pain Type: Acute pain;Surgical pain Pain Location: Knee Pain Orientation: Right;Left  Pain Frequency: Intermittent(Pain with movement) Home Living/Prior Functioning Home Living Family/patient expects to be discharged to:: Private residence Living Arrangements: Spouse/significant other Available Help at Discharge: Family, Available PRN/intermittently Type of Home: House Home Access: Stairs to enter [employer] of Steps: 2 + 1  Entrance Stairs-Rails: None Home Layout: One level Bathroom Shower/Tub: Chiropodist: Standard  Lives  With: Spouse IADL History Homemaking Responsibilities: No Current License: Yes Occupation: Full time employment 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 Prior Function Level of Independence: Independent with basic ADLs  Able to Take Stairs?: Yes Driving: Yes Vocation: Full time employment Comments: owns his own business, He is a Air traffic controller; ADL ADL Eating: Set up Where Assessed-Eating: Bed level Grooming: Setup Where Assessed-Grooming: Bed level Upper Body Bathing: Minimal assistance Where Assessed-Upper Body Bathing: Bed level Lower Body Bathing: Dependent Where Assessed-Lower Body Bathing: Bed level Upper Body Dressing: Maximal assistance Where Assessed-Upper Body Dressing: Bed level Lower Body Dressing: Dependent Where Assessed-Lower Body Dressing: Bed level Vision Baseline Vision/History: Wears glasses Wears Glasses: At all times Patient Visual Report: No change from baseline Perception  Perception: Within Functional Limits Praxis Praxis: Intact Cognition Overall Cognitive Status: Within Functional Limits for tasks assessed Arousal/Alertness: Awake/alert Orientation Level: Person;Place Year: 2020 Month: February Day of Week: Correct Memory: Appears intact Immediate Memory Recall: Sock;Blue;Bed Memory Recall: Sock;Blue;Bed Memory Recall Sock: Without Cue Memory Recall Blue: Without Cue Memory Recall Bed: Without Cue Attention: Sustained Awareness: Appears intact Problem Solving: Appears intact Safety/Judgment: Appears intact Sensation Sensation Light Touch: Appears Intact Hot/Cold: Appears Intact Proprioception: Appears Intact Stereognosis: Appears Intact Additional Comments: sensation intact in BUEs Coordination Gross Motor Movements are Fluid and Coordinated: Yes Fine Motor Movements are Fluid and Coordinated: Yes Motor  Motor Motor: Within Functional Limits Motor - Skilled Clinical Observations: LE movement limited by  pain Mobility  Bed Mobility Bed Mobility: Rolling Right;Rolling Left Rolling Right: Minimal Assistance - Patient > 75% Rolling Left: Minimal Assistance - Patient > 75%  Trunk/Postural Assessment  Cervical Assessment Cervical Assessment: Within Functional Limits Thoracic Assessment Thoracic Assessment: Within Functional Limits Lumbar Assessment Lumbar Assessment: Within Functional Limits Postural Control Postural Control: Within Functional Limits   Extremity/Trunk Assessment RUE Assessment RUE Assessment: Within Functional Limits LUE Assessment LUE Assessment: Exceptions to [facility] Active Range of Motion (AROM) Comments: WFLs for all joints General Strength Comments: pt with shoulder strength for flexion at 4/5, internal rotation 4/5, external rotation 3+/5,  Elbow flexion and extension 4/5.  Pt with recent history of right anterior shoulder pain which is present with palpation around the biceps tendon.  He reports having a sleeve to wear over the shoulder but it does not feel comfortable so he hasn't been using it consistently.       Refer to Care Plan for Long Term Goals  Recommendations for other services: None    Discharge Criteria: Patient will be discharged from OT if patient refuses treatment 3 consecutive times without medical reason, if treatment goals not met, if there is a change in medical status, if patient makes no progress towards goals or if patient is discharged from hospital.  The above assessment, treatment plan, treatment alternatives and goals were discussed and mutually agreed upon: by patient  Gerilyn Stargell OTR/L 08/02/2018, 5:22 PM

## 2018-08-02 NOTE — Progress Notes (Signed)
[place] PHYSICAL MEDICINE & REHABILITATION PROGRESS NOTE  Subjective/Complaints: Patient seen laying in bed this morning.  He states he did not sleep well overnight.  He states he had a bowel movement earlier this morning and feels like he needs to have another one.  ROS: Denies CP, shortness of breath, nausea, vomiting, diarrhea.  Objective: Vital Signs: Blood pressure 128/65, pulse 68, temperature 98.9 F (37.2 C), temperature source Oral, resp. rate 16, height 6\' 2"  (1.88 m), weight (!) 226.9 kg, SpO2 99 %. No results found. Recent Labs    08/02/18 0531  WBC 7.7  HGB 8.4*  HCT 27.8*  PLT 178   Recent Labs    08/02/18 0531  NA 137  K 4.0  CL 102  CO2 27  GLUCOSE 145*  BUN 11  CREATININE 0.63  CALCIUM 8.3*    Physical Exam: BP 128/65 (BP Location: Left Arm)   Pulse 68   Temp 98.9 F (37.2 C) (Oral)   Resp 16   Ht 6\' 2"  (1.88 m)   Wt (!) 226.9 kg   SpO2 99%   BMI 64.22 kg/m  Constitutional: No distress . Vital signs reviewed.  Morbidly obese. HENT: Normocephalic.  Atraumatic. Eyes: EOMI. No discharge. Cardiovascular: RRR. No JVD. Respiratory: CTA Bilaterally. Normal effort. GI: BS +. Non-distended. Musc: No edema or tenderness in extremities.    Comments: Bilateral lower edema and tenderness  Neurological: He is alert and oriented Motor: Bilateral upper extremities: 5/5 proximal to distal  right lower extremity: Hip flexion 2/5, Knee extension left, ankle dorsiflexion 5/5 Left lower extremity: Hip flexion 3-/5, knee extension locked in brace, ankle dorsiflexion 5/5  Skin: Bilateral Bledsoe braces in place with dressing C/D/I  Psychiatric: He has a normal mood and affect. His behavior is normal  Assessment/Plan: 1. 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  Physiatrist is providing close team supervision and 24  hour management of active medical problems listed below.  Physiatrist and rehab team continue to assess barriers to discharge/monitor patient progress toward functional and medical goals  Care Tool:  Bathing              Bathing assist       Upper Body Dressing/Undressing Upper body dressing   What is the patient wearing?: Hospital gown only    Upper body assist Assist Level: Moderate Assistance - Patient 50 - 74%    Lower Body Dressing/Undressing Lower body dressing      What is the patient wearing?: (pt goes [place])     Lower body assist Assist for lower body dressing: Moderate Assistance - Patient 50 - 74%     Toileting Toileting    Toileting assist Assist for toileting: Moderate Assistance - Patient 50 - 74%     Transfers Chair/bed transfer  Transfers assist  Chair/bed transfer activity did not occur: Safety/medical concerns        Locomotion Ambulation   Ambulation assist              Walk 10 feet activity   Assist           Walk 50 feet activity   Assist           Walk 150 feet activity   Assist           Walk 10 feet on uneven surface  activity   Assist  Wheelchair     Assist               Wheelchair 50 feet with 2 turns activity    Assist            Wheelchair 150 feet activity     Assist            Medical Problem List and Plan: 1.  Decreased functional mobility secondary to bilateral quadricep tendon rupture. Status post repair of bilateral tendon rupture repair 07/21/2018. Weightbearing as tolerated. Bilateral Bledsoe brace locked in extension             Begin CIR 2.  Antithrombotics: -DVT/anticoagulation:  Subcutaneous Lovenox 110 mg daily. Check lower extremity vascular study             -antiplatelet therapy: Not applicable 3. Pain Management:  Lidoderm patch,OxyContin sustained release 10 mg every 12 hours, oxycodone immediate release for breakthrough  pain 4. Mood:  Zoloft 25 mg daily. Provide emotional support             -antipsychotic agents:  Not applicable 5. Neuropsych: This patient is capable of making decisions on his own behalf. 6. Skin/Wound Care: Stage I pressure ulcer. Follow-up wound care nurse Routine skin checks 7. Fluids/Electrolytes/Nutrition:  Routine interval mouth with follow-up  BMP within acceptable range on 2/26 8. Acute blood loss anemia. Transfused 1 unit packed red blood cells postoperatively.   Hemoglobin 8.4 on 2/26  Continue to monitor 9.  Super super obesity. Follow-up dietary services 10. New findingsType 2 diabetes mellitus. Hemoglobin A1c 7.1. Glucophage 500 mg daily Check blood sugars before meals and at bedtime. Diabetic teaching  Monitor with increased mobility 11. Acute otitis media. Augmentin 875-125 mg initiated 07/30/2018. 12. Constipation. Laxative assistance 13. Asthma/OSA/tobacco/ marijuana abuse. CPAP as directed.Continue Symbicort as well as nebulizer treatments. Provide counseling regards to tobacco abuse 14.  Hypoalbuminemia  Supplement initiated on 2/26  LOS: 1 days A FACE TO FACE EVALUATION WAS PERFORMED  Ankit Karis Juba 08/02/2018, 1:07 PM

## 2018-08-02 NOTE — Progress Notes (Signed)
Initial Nutrition Assessment  DOCUMENTATION CODES:   Morbid obesity  INTERVENTION:   - Pro-stat 30 ml BID, each supplement provides 100 kcal and 15 grams of protein  - Snack daily  - Double protein portions with meals  - MVI with minerals daily  NUTRITION DIAGNOSIS:   Increased nutrient needs related to wound healing as evidenced by estimated needs.  GOAL:   Patient will meet greater than or equal to 90% of their needs  MONITOR:   PO intake, Supplement acceptance, Skin, Labs  REASON FOR ASSESSMENT:   Consult Assessment of nutrition requirement/status, Wound healing  ASSESSMENT:   48 year old male with PMH of OSA-CPAP, tobacco abuse, anxiety/depression, asthma, and morbid obesity. Pt resented 07/20/18 after slipping and falling backwards with acute onset of bilateral knee pain. CT of the knees done revealing bilateral quad tendon rupture. Pt underwent repair on 07/21/18. Findings of elevated hemoglobin A1C of 7.1. Pt admitted to CIR on 2/25.  Noted pt received DM diet education on 07/25/18. Pt also seeing an outpatient RD for weight loss. Last visit on 08/03/18.  Spoke with pt at bedside. Pt irritated about being woken up while he was trying to take a nap and states that he has hardly slept in 12 days.  Pt shares that his appetite is good and that he is eating "okay." Pt reports that the food is "crap" and that "no one will let me have snacks." RD offered to order snacks between meals.  Discussed importance of adequate protein intake in wound healing. Pt amenable to trying Pro-stat protein supplement.  Discussed pt's PO intake PTA. Pt reports no issues with appetite or PO intake PTA. Pt shares that he typically consumed 2 meals daily (did not eat breakfast).  Breakfast: skipped Lunch: peanut butter and honey sandwich, cheese, yogurt, beef jerky, cheese puffs Dinner: steak or fish with potatoes and green beans  Pt denies any weight changes recently. Noted pt's weight has  fluctuated between 217-249 kg over the last 1 year. Will continue to monitor weight trends during admission.  Meal Completion: 100% x 1 meal  Medications reviewed and include: SSI, Metformin, Protonix, Senna  Labs reviewed: hemoglobin 8.4 (L), HDL 38 (L) CBG's: 137, 147, 139 x 24 hours  UOP: 600 ml x 24 hours  NUTRITION - FOCUSED PHYSICAL EXAM:    Most Recent Value  Orbital Region  No depletion  Upper Arm Region  No depletion  Thoracic and Lumbar Region  No depletion  Buccal Region  No depletion  Temple Region  No depletion  Clavicle Bone Region  No depletion  Clavicle and Acromion Bone Region  No depletion  Scapular Bone Region  No depletion  Dorsal Hand  No depletion  Patellar Region  No depletion  Anterior Thigh Region  No depletion  Posterior Calf Region  No depletion  Edema (RD Assessment)  Mild [BLE]  Hair  Reviewed  Eyes  Reviewed  Mouth  Reviewed  Skin  Reviewed  Nails  Reviewed       Diet Order:   Diet Order            Diet Carb Modified Fluid consistency: Thin; Room service appropriate? Yes  Diet effective now              EDUCATION NEEDS:   Education needs have been addressed  Skin:  Skin Assessment: Skin Integrity Issues: DTI: sacrum Stage II: buttocks, L thigh Incisions: L knee, R knee  Last BM:  2/26 large type 6  Height:  Ht Readings from Last 1 Encounters:  08/01/18 6\' 2"  (1.88 m)    Weight:   Wt Readings from Last 1 Encounters:  08/02/18 (!) 226.9 kg    Ideal Body Weight:  86.4 kg  BMI:  Body mass index is 64.22 kg/m.  Estimated Nutritional Needs:   Kcal:  2600-2800  Protein:  120-135 grams  Fluid:  >/= 2.4 L    Earma Reading, MS, RD, LDN Inpatient Clinical Dietitian Pager: [phone] Weekend/After Hours: [phone]

## 2018-08-03 ENCOUNTER — Inpatient Hospital Stay (HOSPITAL_COMMUNITY): Payer: Self-pay

## 2018-08-03 ENCOUNTER — Inpatient Hospital Stay (HOSPITAL_COMMUNITY): Payer: Self-pay | Admitting: Occupational Therapy

## 2018-08-03 ENCOUNTER — Inpatient Hospital Stay (HOSPITAL_COMMUNITY): Payer: Self-pay | Admitting: Physical Therapy

## 2018-08-03 DIAGNOSIS — R0989 Other specified symptoms and signs involving the circulatory and respiratory systems: Secondary | ICD-10-CM

## 2018-08-03 DIAGNOSIS — S76119S Strain of unspecified quadriceps muscle, fascia and tendon, sequela: Secondary | ICD-10-CM

## 2018-08-03 DIAGNOSIS — G479 Sleep disorder, unspecified: Secondary | ICD-10-CM

## 2018-08-03 LAB — GLUCOSE, CAPILLARY
Glucose-Capillary: 123 mg/dL — ABNORMAL HIGH (ref 70–99)
Glucose-Capillary: 111 mg/dL — ABNORMAL HIGH (ref 70–99)
Glucose-Capillary: 128 mg/dL — ABNORMAL HIGH (ref 70–99)
Glucose-Capillary: 134 mg/dL — ABNORMAL HIGH (ref 70–99)

## 2018-08-03 MED ORDER — TEMAZEPAM 15 MG PO CAPS
15.0000 mg | ORAL_CAPSULE | Freq: Every day | ORAL | Status: DC
  Administered 2018-08-03 – 2018-08-29 (×26): 15 mg via ORAL
  Filled 2018-08-03 (×27): qty 1

## 2018-08-03 MED ORDER — TRAZODONE HCL 50 MG PO TABS
100.0000 mg | ORAL_TABLET | Freq: Every evening | ORAL | Status: DC | PRN
  Administered 2018-08-11 – 2018-08-12 (×2): 50 mg via ORAL
  Filled 2018-08-03 (×4): qty 2

## 2018-08-03 NOTE — Progress Notes (Signed)
Pt complaining of dizziness and lightheadedness when lifting head off bed and turning head from side to side. Pt lies flat in bed most of the day. Encouraged to lie with head of the bed up to 30 degrees as much as possible and to change positions slowly. Encouraged patient to sit in upright position as often as possible.

## 2018-08-03 NOTE — Progress Notes (Signed)
Occupational Therapy Session Note  Patient Details  Name: Larry Fowler MRN: [id number] Date of Birth: Oct 22, 1970  Today's Date: 08/03/2018 OT Individual Time: 0800-0830 OT Individual Time Calculation (min): 30 min    Short Term Goals: Week 1:  OT Short Term Goal 1 (Week 1): Pt will tolerate sitting EOB for UB selfcare with supervision for 15 mins.   OT Short Term Goal 2 (Week 1): Pt will perform LB bathing supine to sit with mod assist.  OT Short Term Goal 3 (Week 1): Pt will tolerate tilted bed up to 75 degrees for at least 2 mins in preparation for  OT Short Term Goal 4 (Week 1): Pt will use sockaide to donn gripper socks in sitting with setup only.   OT Short Term Goal 5 (Week 1): Pt will perform stand pivot transfer to bariatric 3:1 with total assist +2 (pt 30%).  Skilled Therapeutic Interventions/Progress Updates:    Upon entering the room, pt supine in bed with RN present to give medications. Pt with no c/o pain this session and requesting to wash and change clothing. Pt washing UB with set up A and total A for LB self care. Pt rolling L <> R with use of bed rails and mod A. Total A to don LB clothing and pt donning UB clothing with min A to pull down trunk only. Pt utilized rails to reposition self and pt transitioning easily to PT session.   Therapy Documentation Precautions:  Precautions Precautions: Fall Required Braces or Orthoses: Other Brace(Bledsoe braces bilaterally locked into 0 extension) Knee Immobilizer - Right: On at all times Knee Immobilizer - Left: On at all times Other Brace: Bilateral Bledsoe braces locked in extension Restrictions Weight Bearing Restrictions: No RLE Weight Bearing: Weight bearing as tolerated LLE Weight Bearing: Weight bearing as tolerated General:   Vital Signs:  Pain: Pain Assessment Pain Scale: 0-10 Pain Score: 0-No pain ADL: ADL Eating: Set up Where Assessed-Eating: Bed level Grooming: Setup Where Assessed-Grooming: Bed  level Upper Body Bathing: Minimal assistance Where Assessed-Upper Body Bathing: Bed level Lower Body Bathing: Dependent Where Assessed-Lower Body Bathing: Bed level Upper Body Dressing: Maximal assistance Where Assessed-Upper Body Dressing: Bed level Lower Body Dressing: Dependent Where Assessed-Lower Body Dressing: Bed level Vision Baseline Vision/History: Wears glasses Wears Glasses: At all times Patient Visual Report: No change from baseline Vision Assessment?: No apparent visual deficits Perception  Perception: Within Functional Limits Praxis Praxis: Intact Exercises:   Other Treatments:     Therapy/Group: Individual Therapy  Alen Bleacher 08/03/2018, 8:49 AM

## 2018-08-03 NOTE — Progress Notes (Signed)
Physical Therapy Session Note  Patient Details  Name: Larry Fowler MRN: [id number] Date of Birth: 1971/04/29  Today's Date: 08/03/2018 PT Individual Time: 0830-0915 PT Individual Time Calculation (min): 45 min   Short Term Goals: Week 1:  PT Short Term Goal 1 (Week 1): Pt will perform sit<>supine with mod assist  PT Short Term Goal 2 (Week 1): Pt will remain standing x 5 minutes on TIS bed.  PT Short Term Goal 3 (Week 1): Pt will perform sit<>stand from elevated surface with max + 2 assist    Skilled Therapeutic Interventions/Progress Updates:   Pt received supine in bed and agreeable to PT. Supine>sit transfer with total + 2 for safety. Sitting EOB x 3 minutes, and pt reports significant back pain and returned to supine with BLE hanging off bed. Return to sitting, and able to perform sitting EOB x 1 min, before requesting to return to supine due to pain in Bil thighs. Sit>supine with max assist x 2 to control trunk and BLE. Supine>semi stand in lift bed through sitting position. Able to maintain sitting with BLE on foot of bed x 10 minutes. . Sit>supine completed with total assist from bed and left supine in bed with call bell in reach and all needs met.        Therapy Documentation Precautions:  Precautions Precautions: Fall Required Braces or Orthoses: Other Brace(Bledsoe braces bilaterally locked into 0 extension) Knee Immobilizer - Right: On at all times Knee Immobilizer - Left: On at all times Other Brace: Bilateral Bledsoe braces locked in extension Restrictions Weight Bearing Restrictions: No RLE Weight Bearing: Weight bearing as tolerated LLE Weight Bearing: Weight bearing as tolerated   Pain:   0/10 at rest.     Therapy/Group: Individual Therapy  Lorie Phenix 08/03/2018, 2:05 PM

## 2018-08-03 NOTE — Progress Notes (Signed)
Pt has home cpap.  Pt self places self on cpap.  No assistance needed from RT.

## 2018-08-03 NOTE — Progress Notes (Signed)
Occupational Therapy Session Note  Patient Details  Name: Larry Fowler MRN: [id number] Date of Birth: 03/10/1971  Today's Date: 08/03/2018 OT Individual Time: 1403-1500 OT Individual Time Calculation (min): 57 min    Short Term Goals: Week 1:  OT Short Term Goal 1 (Week 1): Pt will tolerate sitting EOB for UB selfcare with supervision for 15 mins.   OT Short Term Goal 2 (Week 1): Pt will perform LB bathing supine to sit with mod assist.  OT Short Term Goal 3 (Week 1): Pt will tolerate tilted bed up to 75 degrees for at least 2 mins in preparation for  OT Short Term Goal 4 (Week 1): Pt will use sockaide to donn gripper socks in sitting with setup only.   OT Short Term Goal 5 (Week 1): Pt will perform stand pivot transfer to bariatric 3:1 with total assist +2 (pt 30%).  Skilled Therapeutic Interventions/Progress Updates:    1:1. Pt received in bed. Pt with no c/o pain. OT slides up bledslow braces on BLE as it is pressing into heels. OT also applies pink foam patches to area where brace is pressing into ankles. Pt sits ~80* in bed to completes 3x30 ball tosses (chest pass and overhead pass) for BUE strengthening with 3# wrist weights on BLE with focus on tricep extension in prep for pushing up into tanding for functional transfers. Pt completes 8# dowel rod HEP 2x15 as follows: shoudler flex/ext, chest press, shoulder press, cirlces (forward/backward), elbow flex/ext, wrist flex/ext, horizontal ab/adduct for BUE strengthening required for BADLs and functional transfers. Exited session with pt seated in bed, call light in reach and all needs met  Therapy Documentation Precautions:  Precautions Precautions: Fall Required Braces or Orthoses: Other Brace(Bledsoe braces bilaterally locked into 0 extension) Knee Immobilizer - Right: On at all times Knee Immobilizer - Left: On at all times Other Brace: Bilateral Bledsoe braces locked in extension Restrictions Weight Bearing Restrictions:  No RLE Weight Bearing: Weight bearing as tolerated LLE Weight Bearing: Weight bearing as tolerated General:   Vital Signs:    Therapy/Group: Individual Therapy   M  08/03/2018, 3:00 PM 

## 2018-08-03 NOTE — Progress Notes (Signed)
Occupational Therapy Session Note  Patient Details  Name: Larry Fowler MRN: [id number] Date of Birth: [birth year]  Today's Date: 08/03/2018 OT Individual Time: 1105-1200 OT Individual Time Calculation (min): 55 min    Short Term Goals: Week 1:  OT Short Term Goal 1 (Week 1): Pt will tolerate sitting EOB for UB selfcare with supervision for 15 mins.   OT Short Term Goal 2 (Week 1): Pt will perform LB bathing supine to sit with mod assist.  OT Short Term Goal 3 (Week 1): Pt will tolerate tilted bed up to 75 degrees for at least 2 mins in preparation for  OT Short Term Goal 4 (Week 1): Pt will use sockaide to donn gripper socks in sitting with setup only.   OT Short Term Goal 5 (Week 1): Pt will perform stand pivot transfer to bariatric 3:1 with total assist +2 (pt 30%).  Skilled Therapeutic Interventions/Progress Updates:    1:1. Pt received in bed reporting chronic pain in shoulder and declines intervention. Pt reporting need to void urine. Pt requires mod A for pulling pants down hips as well as placing towel/urinal. Continent void into urinal with increased time. Pt manages bed features with MOD I to assume >80* sitting upright for oral care, hair washing with warm shower cap and UE exercise. Pt compeltes 5x1 min beach ball volley bouncing ball back to OT with 5# dowel rod seated upright in bed. Exited session with pt seated in bed, call light in reach and all needs met  Therapy Documentation Precautions:  Precautions Precautions: Fall Required Braces or Orthoses: Other Brace(Bledsoe braces bilaterally locked into 0 extension) Knee Immobilizer - Right: On at all times Knee Immobilizer - Left: On at all times Other Brace: Bilateral Bledsoe braces locked in extension Restrictions Weight Bearing Restrictions: No RLE Weight Bearing: Weight bearing as tolerated LLE Weight Bearing: Weight bearing as tolerated General:   Vital Signs:  Pain: Pain Assessment Pain Scale: 0-10 Pain  Score: 0-No pain   Therapy/Group: Individual Therapy  Tonny Branch 08/03/2018, 12:03 PM

## 2018-08-03 NOTE — Progress Notes (Signed)
[place] PHYSICAL MEDICINE & REHABILITATION PROGRESS NOTE  Subjective/Complaints: Patient seen laying in bed this morning.  He states he did not sleep well overnight.  He is adamant that he wants to sleep medications.  He is also not using his CPAP.  Encourage compliance.  ROS: Denies CP, shortness of breath, nausea, vomiting, diarrhea.  Objective: Vital Signs: Blood pressure (!) 100/59, pulse 72, temperature 98.2 F (36.8 C), temperature source Oral, resp. rate 20, height  (1.88 m), weight (!) 226.9 kg, SpO2 96 %. Vas [place] Lower Extremity Venous (dvt)  Result Date: 08/02/2018  Lower Venous Study Indications: Swelling.  Performing Technologist: Levada Schilling RDMS, RVT  Examination Guidelines: A complete evaluation includes B-mode imaging, spectral Doppler, color Doppler, and power Doppler as needed of all accessible portions of each vessel. Bilateral testing is considered an integral part of a complete examination. Limited examinations for reoccurring indications may be performed as noted.  Right Venous Findings: +---------+---------------+---------+-----------+----------+-------+          CompressibilityPhasicitySpontaneityPropertiesSummary +---------+---------------+---------+-----------+----------+-------+ CFV      Full                                                 +---------+---------------+---------+-----------+----------+-------+ SFJ      Full                                                 +---------+---------------+---------+-----------+----------+-------+ FV Prox  Full                                                 +---------+---------------+---------+-----------+----------+-------+ FV Mid   Full                                                 +---------+---------------+---------+-----------+----------+-------+ FV DistalFull                                                 +---------+---------------+---------+-----------+----------+-------+  PFV      Full                                                 +---------+---------------+---------+-----------+----------+-------+ POP      Full                                                 +---------+---------------+---------+-----------+----------+-------+ PTV                     Yes      Yes                          +---------+---------------+---------+-----------+----------+-------+  PERO                    Yes      Yes                          +---------+---------------+---------+-----------+----------+-------+ GSV      Full                                                 +---------+---------------+---------+-----------+----------+-------+  Left Venous Findings: +---------+---------------+---------+-----------+----------+------------------+          CompressibilityPhasicitySpontaneityPropertiesSummary            +---------+---------------+---------+-----------+----------+------------------+ CFV      Full                                                            +---------+---------------+---------+-----------+----------+------------------+ SFJ      Full                                                            +---------+---------------+---------+-----------+----------+------------------+ FV Prox  Full                                                            +---------+---------------+---------+-----------+----------+------------------+ FV Mid   Full                                                            +---------+---------------+---------+-----------+----------+------------------+ FV DistalFull                                         poor visualization +---------+---------------+---------+-----------+----------+------------------+ PFV      Full                                                            +---------+---------------+---------+-----------+----------+------------------+ POP      Full                                                             +---------+---------------+---------+-----------+----------+------------------+ PTV  Not visualized     +---------+---------------+---------+-----------+----------+------------------+ PERO                                                  Not visualized     +---------+---------------+---------+-----------+----------+------------------+ GSV      Full                                                            +---------+---------------+---------+-----------+----------+------------------+    Summary: Right: There is no evidence of deep vein thrombosis in the lower extremity. However, portions of this examination were limited- see technologist comments above. No cystic structure found in the popliteal fossa. Left: There is no evidence of deep vein thrombosis in the lower extremity. However, portions of this examination were limited- see technologist comments above. No cystic structure found in the popliteal fossa.  *See table(s) above for measurements and observations. Electronically signed by Gretta Began MD on 08/02/2018 at 6:25:28 PM.    Final    Recent Labs    08/02/18 0531  WBC 7.7  HGB 8.4*  HCT 27.8*  PLT 178   Recent Labs    08/02/18 0531  NA 137  K 4.0  CL 102  CO2 27  GLUCOSE 145*  BUN 11  CREATININE 0.63  CALCIUM 8.3*    Physical Exam: BP (!) 100/59 (BP Location: Left Arm)   Pulse 72   Temp 98.2 F (36.8 C) (Oral)   Resp 20   Ht 6\' 2"  (1.88 m)   Wt (!) 226.9 kg   SpO2 96%   BMI 64.22 kg/m  Constitutional: No distress . Vital signs reviewed.  Morbidly obese. HENT: Normocephalic.  Atraumatic. Eyes: EOMI. No discharge. Cardiovascular: RRR.  No JVD. Respiratory: CTA bilaterally.  Normal effort. GI: BS +. Non-distended. Musc: Bilateral lower edema and tenderness  Neurological: He is alert and oriented Motor: Bilateral upper extremities: 5/5 proximal to  distal Right lower extremity: Hip flexion 2/5, Knee extension left, ankle dorsiflexion 5/5, stable Left lower extremity: Hip flexion 3-/5, knee extension locked in brace, ankle dorsiflexion 5/5, stable Skin: Bilateral Bledsoe braces in place with dressing C/D/I  Psychiatric: He has a normal mood and affect. His behavior is normal  Assessment/Plan: 1. 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  Physiatrist is providing close team supervision and 24 hour management of active medical problems listed below.  Physiatrist and rehab team continue to assess barriers to discharge/monitor patient progress toward functional and medical goals  Care Tool:  Bathing    Body parts bathed by patient: Right arm, Left arm, Chest, Abdomen, Face   Body parts bathed by helper: Buttocks, Front perineal area, Right lower leg, Left upper leg, Right upper leg, Left lower leg     Bathing assist Assist Level: Maximal Assistance - Patient 24 - 49%     Upper Body Dressing/Undressing Upper body dressing   What is the patient wearing?: Pull over shirt    Upper body assist Assist Level: Minimal Assistance - Patient > 75%    Lower Body Dressing/Undressing Lower body dressing  What is the patient wearing?: Pants     Lower body assist Assist for lower body dressing: Total Assistance - Patient < 25%     Toileting Toileting    Toileting assist Assist for toileting: Total Assistance - Patient < 25%     Transfers Chair/bed transfer  Transfers assist  Chair/bed transfer activity did not occur: Safety/medical concerns        Locomotion Ambulation   Ambulation assist   Ambulation activity did not occur: Safety/medical concerns          Walk 10 feet activity   Assist  Walk 10 feet activity did not occur: Safety/medical concerns        Walk  50 feet activity   Assist Walk 50 feet with 2 turns activity did not occur: Safety/medical concerns         Walk 150 feet activity   Assist Walk 150 feet activity did not occur: Safety/medical concerns         Walk 10 feet on uneven surface  activity   Assist Walk 10 feet on uneven surfaces activity did not occur: Safety/medical concerns         Wheelchair     Assist     Wheelchair activity did not occur: Safety/medical concerns         Wheelchair 50 feet with 2 turns activity    Assist    Wheelchair 50 feet with 2 turns activity did not occur: Safety/medical concerns       Wheelchair 150 feet activity     Assist Wheelchair 150 feet activity did not occur: Safety/medical concerns          Medical Problem List and Plan: 1.  Decreased functional mobility secondary to bilateral quadricep tendon rupture. Status post repair of bilateral tendon rupture repair 07/21/2018. Weightbearing as tolerated. Bilateral Bledsoe brace locked in extension             Continue CIR 2.  Antithrombotics: -DVT/anticoagulation:  Subcutaneous Lovenox 110 mg daily.   Lower extremity vascular ultrasound limited, but negative             -antiplatelet therapy: Not applicable 3. Pain Management:  Lidoderm patch,OxyContin sustained release 10 mg every 12 hours, oxycodone immediate release for breakthrough pain 4. Mood:  Zoloft 25 mg daily. Provide emotional support             -antipsychotic agents:  Not applicable 5. Neuropsych: This patient is capable of making decisions on his own behalf. 6. Skin/Wound Care: Stage I pressure ulcer. Follow-up wound care nurse Routine skin checks 7. Fluids/Electrolytes/Nutrition:  Routine interval mouth with follow-up  BMP within acceptable range on 2/26 8. Acute blood loss anemia. Transfused 1 unit packed red blood cells postoperatively.   Hemoglobin 8.4 on 2/26  Continue to monitor 9.  Super super obesity. Follow-up dietary  services 10. New findingsType 2 diabetes mellitus. Hemoglobin A1c 7.1. Glucophage 500 mg daily Check blood sugars before meals and at bedtime. Diabetic teaching  Labile, but improving on 2/27  Monitor with increased mobility 11. Acute otitis media. Augmentin 875-125 mg initiated 07/30/2018. 12. Constipation. Laxative assistance 13. Asthma/OSA/tobacco/ marijuana abuse. CPAP as directed.Continue Symbicort as well as nebulizer treatments. Provide counseling regards to tobacco abuse 14.  Hypoalbuminemia  Supplement initiated on 2/26 15.  Labile blood pressure  Labile on 2/27, monitor for trend 16.  Sleep disturbance  Restoril started on 2/27  LOS: 2 days A FACE TO FACE EVALUATION WAS PERFORMED   Karis Juba 08/03/2018, 1:08  PM

## 2018-08-04 ENCOUNTER — Inpatient Hospital Stay (HOSPITAL_COMMUNITY): Payer: Self-pay | Admitting: Occupational Therapy

## 2018-08-04 ENCOUNTER — Inpatient Hospital Stay (HOSPITAL_COMMUNITY): Payer: Self-pay | Admitting: Physical Therapy

## 2018-08-04 DIAGNOSIS — J029 Acute pharyngitis, unspecified: Secondary | ICD-10-CM

## 2018-08-04 DIAGNOSIS — R7309 Other abnormal glucose: Secondary | ICD-10-CM

## 2018-08-04 LAB — GLUCOSE, CAPILLARY
GLUCOSE-CAPILLARY: 161 mg/dL — AB (ref 70–99)
Glucose-Capillary: 144 mg/dL — ABNORMAL HIGH (ref 70–99)
Glucose-Capillary: 135 mg/dL — ABNORMAL HIGH (ref 70–99)
Glucose-Capillary: 120 mg/dL — ABNORMAL HIGH (ref 70–99)

## 2018-08-04 MED ORDER — PHENOL 1.4 % MT LIQD: 1.0000 | OROMUCOSAL | Status: DC | PRN

## 2018-08-04 NOTE — Patient Care Conference (Signed)
Inpatient RehabilitationTeam Conference and Plan of Care Update Date: 08/02/2018   Time: 2:55 PM    Patient Name: Larry Fowler      Medical Record Number: [id number]  Date of Birth: 09/19/1970 Sex: Male         Room/Bed: 4W15C/4W15C-01 Payor Info: Payor: [employer] / Plan: [place] [employer] / Product Type: *No Product type* /    Admitting Diagnosis: BQUAD PENDON RUPURES  Admit Date/Time:  08/01/2018  3:58 PM Admission Comments: No comment available   Primary Diagnosis:  <principal problem not specified> Principal Problem: <principal problem not specified>  Patient Active Problem List   Diagnosis Date Noted  . Labile blood glucose   . Sore throat   . Sleep disturbance   . Labile blood pressure   . Hypoalbuminemia due to protein-calorie malnutrition (HCC)   . New onset type 2 diabetes mellitus (HCC)   . Marijuana abuse   . Drug induced constipation   . Otitis media   . Acute blood loss anemia   . Pressure injury of skin 07/30/2018  . Anxiety and depression   . Super-super obese (HCC)   . Postoperative pain   . Tobacco abuse   . Postoperative anemia due to acute blood loss 07/23/2018    Class: Acute  . Rupture of left quadriceps muscle   . Quadriceps tendon rupture 07/20/2018  . Nutritional counseling 07/03/2018  . Disorder of tendon of left biceps 12/05/2017  . Hypogonadism in male 10/25/2017  . Hyperglycemia   . Preventative health care 03/21/2017  . Morbid obesity (HCC) 03/21/2017  . OSA (obstructive sleep apnea) 03/21/2017  . Chronic pain 03/21/2017  . Smoker 03/21/2017  . HTN (hypertension) 03/21/2017  . Fatigue 03/21/2017  . Left inguinal hernia 03/21/2017  . Rash 03/21/2017  . History of cocaine use   . Cannabis abuse   . Renal stone   . Anal abscess, ? fistula 04/05/2011  . Asthma 12/17/2009  . 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 DEPRESSION 09/12/2007  . Allergic rhinitis 09/12/2007  . ANXIETY 02/16/2007    Expected Discharge Date:  Expected Discharge Date: (3 weeks)  Team Members Present: Physician leading conference: Dr. Maryla Morrow Social Worker Present: Amada Jupiter, LCSW Nurse Present: Chana Bode, RN PT Present: Grier Rocher, PT OT Present: Perrin Maltese, OT SLP Present: Jackalyn Lombard, SLP PPS Coordinator present : Fae Pippin     Current Status/Progress Goal Weekly Team Focus  Medical   Decreased functional mobility secondary to bilateral quadricep tendon rupture. Status post repair of bilateral tendon rupture repair 07/21/2018.   Improve therapies, DM, ABLA  See above   Bowel/Bladder   continent of bowel and bladder; LBM 2/26  remain continent  monitor and assist as needed    Swallow/Nutrition/ Hydration             ADL's   mod to max assist for UB selfcare with total assist for LB selfcare bed level  supervision to min assist overall  selfcare retraining, balance retraining, transfer training, therapeutic exercise, AE/DME education   Mobility   Total +2 assist for mobility. to come to sitting EOB. Dependend sit>stand with TIS bed   Min-mod assist with bed mobility, transfers and Gait. Supervision assist WC as tolerated   Improved safety with trasnfers and bed mobility,. improved tolerance and WB through BLE     Communication             Safety/Cognition/ Behavioral Observations  Pain   c/o pain to tibia & fibula r/t to bilateral quad tendon rupture; bilateral bledsoe braces in place   continue to assess and treat as needed   assess q shift and PRN    Skin   stage 1 on bottom; wond consult in place; MASD to groin lotrimin cream; nystatin powder to folds and b/t legs   no new areas of skin breakdwn and/or remain free from any new skin issues   assess q shift; PRN and treat as needed     Rehab Goals Patient on target to meet rehab goals: Yes *See Care Plan and progress notes for long and short-term goals.     Barriers to Discharge  Current Status/Progress Possible Resolutions Date  Resolved   Physician    Medical stability;Weight;Decreased caregiver support;Lack of/limited family support     See above  Therapies, optimize DM meds, follow labs      Nursing                  PT  Inaccessible home environment;Decreased caregiver support;Home environment access/layout;Weight;Weight bearing restrictions                 OT Home environment access/layout  Pt with locked out hinged knee braces.  Will need have higher surfaces to transfer to for success with toilet and wheelchair transfers.               SLP                SW Lack of/limited family support;Inaccessible home environment Have asked wife to begin thinking about and talking with others in order to cover 24/7.  Wife was made aware by [facility] and myself that a ramp needs to be put into place ASAP.            Discharge Planning/Teaching Needs:  Pt to d/c home with wife as primary caregiver;  other family will likely need to assist in order to covery 24/7 care.      Team Discussion:  Monitoring BP and adjusting DM meds.  Deep tissue injury on coccyx;  Cont b/b.  Mod - total assist with therapies. Working on 60 degree in bed.  Vestibule vs severe fatigue?  Hoping to reach min assist goals, however, this may be lofty.  Revisions to Treatment Plan:  NA    Continued Need for Acute Rehabilitation Level of Care: The patient requires daily medical management by a physician with specialized training in physical medicine and rehabilitation for the following conditions: Daily direction of a multidisciplinary physical rehabilitation program to ensure safe treatment while eliciting the highest outcome that is of practical value to the patient.: Yes Daily medical management of patient stability for increased activity during participation in an intensive rehabilitation regime.: Yes Daily analysis of laboratory values and/or radiology reports with any subsequent need for medication adjustment of medical intervention for : Post surgical  problems;Wound care problems;Diabetes problems;Other   I attest that I was present, lead the team conference, and concur with the assessment and plan of the team.   Mateja Dier 08/04/2018, 3:41 PM

## 2018-08-04 NOTE — Care Management (Signed)
Inpatient Rehabilitation Center Individual Statement of Services  Patient Name:  TYSE KOCHEVAR  Date:  08/04/2018  Welcome to the Inpatient Rehabilitation Center.  Our goal is to provide you with an individualized program based on your diagnosis and situation, designed to meet your specific needs.  With this comprehensive rehabilitation program, you will be expected to participate in at least 3 hours of rehabilitation therapies Monday-Friday, with modified therapy programming on the weekends.  Your rehabilitation program will include the following services:  Physical Therapy (PT), Occupational Therapy (OT), 24 hour per day rehabilitation nursing, Therapeutic Recreaction (TR), Neuropsychology, Case Management (Social Worker), Rehabilitation Medicine, Nutrition Services and Pharmacy Services  Weekly team conferences will be held on Tuesdays to discuss your progress.  Your Social Worker will talk with you frequently to get your input and to update you on team discussions.  Team conferences with you and your family in attendance may also be held.  Expected length of stay: 19-21 days   Overall anticipated outcome: minimal - moderate assistance  Depending on your progress and recovery, your program may change. Your Social Worker will coordinate services and will keep you informed of any changes. Your Social Worker's name and contact numbers are listed  below.  The following services may also be recommended but are not provided by the Inpatient Rehabilitation Center:   Driving Evaluations  Home Health Rehabiltiation Services  Outpatient Rehabilitation Services  Vocational Rehabilitation   Arrangements will be made to provide these services after discharge if needed.  Arrangements include referral to agencies that provide these services.  Your insurance has been verified to be:  [employer] [employer] Your primary doctor is:  Dr. Jonny Ruiz  Pertinent information will be shared with your doctor and your  insurance company.  Social Worker:  [place], [place] [phone] or ([phone]   Information discussed with and copy given to patient by: Amada Jupiter, 08/04/2018, 11:07 AM

## 2018-08-04 NOTE — Plan of Care (Signed)
  Problem: RH BLADDER ELIMINATION Goal: RH STG MANAGE BLADDER WITH ASSISTANCE Description STG Manage Bladder With Min Assistance  Outcome: Not Progressing  Pt still requiring total assist to use the urinal and provide peri care.  Problem: RH SKIN INTEGRITY Goal: RH STG SKIN FREE OF INFECTION/BREAKDOWN Description Patients skin will be free from breakdown and inspected every shift.  Outcome: Not Progressing  Pt needs extra encouragement to turn ever 1.5 hours for DTI on bottom   Problem: RH SKIN INTEGRITY Goal: RH STG MAINTAIN SKIN INTEGRITY WITH ASSISTANCE Description STG Maintain Skin Integrity With Min Assistance.  Outcome: Progressing

## 2018-08-04 NOTE — Progress Notes (Signed)
[facility] PHYSICAL MEDICINE & REHABILITATION PROGRESS NOTE  Subjective/Complaints: Patient seen laying in bed this AM.  He states he slept better overnight with sleeping medication.  He complains of sore throat and asks for medication.   ROS: +Sore throat. Denies CP, shortness of breath, nausea, vomiting, diarrhea.  Objective: Vital Signs: Blood pressure (!) 118/53, pulse 66, temperature 97.8 F (36.6 C), temperature source Oral, resp. rate 16, height  (1.88 m), weight (!) 226.9 kg, SpO2 97 %. Vas [place] Lower Extremity Venous (dvt)  Result Date: 08/02/2018  Lower Venous Study Indications: Swelling.  Performing Technologist: Levada Schilling RDMS, RVT  Examination Guidelines: A complete evaluation includes B-mode imaging, spectral Doppler, color Doppler, and power Doppler as needed of all accessible portions of each vessel. Bilateral testing is considered an integral part of a complete examination. Limited examinations for reoccurring indications may be performed as noted.  Right Venous Findings: +---------+---------------+---------+-----------+----------+-------+          CompressibilityPhasicitySpontaneityPropertiesSummary +---------+---------------+---------+-----------+----------+-------+ CFV      Full                                                 +---------+---------------+---------+-----------+----------+-------+ SFJ      Full                                                 +---------+---------------+---------+-----------+----------+-------+ FV Prox  Full                                                 +---------+---------------+---------+-----------+----------+-------+ FV Mid   Full                                                 +---------+---------------+---------+-----------+----------+-------+ FV DistalFull                                                 +---------+---------------+---------+-----------+----------+-------+ PFV      Full                                                  +---------+---------------+---------+-----------+----------+-------+ POP      Full                                                 +---------+---------------+---------+-----------+----------+-------+ PTV                     Yes      Yes                          +---------+---------------+---------+-----------+----------+-------+  PERO                    Yes      Yes                          +---------+---------------+---------+-----------+----------+-------+ GSV      Full                                                 +---------+---------------+---------+-----------+----------+-------+  Left Venous Findings: +---------+---------------+---------+-----------+----------+------------------+          CompressibilityPhasicitySpontaneityPropertiesSummary            +---------+---------------+---------+-----------+----------+------------------+ CFV      Full                                                            +---------+---------------+---------+-----------+----------+------------------+ SFJ      Full                                                            +---------+---------------+---------+-----------+----------+------------------+ FV Prox  Full                                                            +---------+---------------+---------+-----------+----------+------------------+ FV Mid   Full                                                            +---------+---------------+---------+-----------+----------+------------------+ FV DistalFull                                         poor visualization +---------+---------------+---------+-----------+----------+------------------+ PFV      Full                                                            +---------+---------------+---------+-----------+----------+------------------+ POP      Full                                                             +---------+---------------+---------+-----------+----------+------------------+ PTV  Not visualized     +---------+---------------+---------+-----------+----------+------------------+ PERO                                                  Not visualized     +---------+---------------+---------+-----------+----------+------------------+ GSV      Full                                                            +---------+---------------+---------+-----------+----------+------------------+    Summary: Right: There is no evidence of deep vein thrombosis in the lower extremity. However, portions of this examination were limited- see technologist comments above. No cystic structure found in the popliteal fossa. Left: There is no evidence of deep vein thrombosis in the lower extremity. However, portions of this examination were limited- see technologist comments above. No cystic structure found in the popliteal fossa.  *See table(s) above for measurements and observations. Electronically signed by Gretta Began MD on 08/02/2018 at 6:25:28 PM.    Final    Recent Labs    08/02/18 0531  WBC 7.7  HGB 8.4*  HCT 27.8*  PLT 178   Recent Labs    08/02/18 0531  NA 137  K 4.0  CL 102  CO2 27  GLUCOSE 145*  BUN 11  CREATININE 0.63  CALCIUM 8.3*    Physical Exam: BP (!) 118/53 (BP Location: Left Arm)   Pulse 66   Temp 97.8 F (36.6 C) (Oral)   Resp 16   Ht 6\' 2"  (1.88 m)   Wt (!) 226.9 kg   SpO2 97%   BMI 64.22 kg/m  Constitutional: No distress . Vital signs reviewed.  Morbidly obese. HENT: Normocephalic.  Atraumatic. Eyes: EOMI. No discharge. Cardiovascular: RRR. No JVD. Respiratory: CTA bilaterally.  Normal effort. GI: BS +. Non-distended. Musc: Bilateral lower edema and tenderness  Neurological: He is alert and oriented Motor: Bilateral upper extremities: 5/5 proximal to distal Right lower extremity: Hip  flexion 3/5, Knee extension left, ankle dorsiflexion 5/5, stable Left lower extremity: Hip flexion 3+-4-/5, knee extension locked in brace, ankle dorsiflexion 5/5, stable Skin: Bilateral Bledsoe braces in place with dressing C/D/I  Psychiatric: He has a normal mood and affect. His behavior is normal  Assessment/Plan: 1. 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  Physiatrist is providing close team supervision and 24 hour management of active medical problems listed below.  Physiatrist and rehab team continue to assess barriers to discharge/monitor patient progress toward functional and medical goals  Care Tool:  Bathing    Body parts bathed by patient: Right arm, Left arm, Chest, Abdomen, Face   Body parts bathed by helper: Buttocks, Front perineal area, Right lower leg, Left upper leg, Right upper leg, Left lower leg     Bathing assist Assist Level: Maximal Assistance - Patient 24 - 49%     Upper Body Dressing/Undressing Upper body dressing   What is the patient wearing?: Pull over shirt    Upper body assist Assist Level: Minimal Assistance - Patient > 75%    Lower Body Dressing/Undressing Lower body dressing  What is the patient wearing?: Pants     Lower body assist Assist for lower body dressing: Total Assistance - Patient < 25%     Toileting Toileting    Toileting assist Assist for toileting: Total Assistance - Patient < 25%     Transfers Chair/bed transfer  Transfers assist  Chair/bed transfer activity did not occur: Safety/medical concerns        Locomotion Ambulation   Ambulation assist   Ambulation activity did not occur: Safety/medical concerns          Walk 10 feet activity   Assist  Walk 10 feet activity did not occur: Safety/medical concerns        Walk 50 feet activity   Assist  Walk 50 feet with 2 turns activity did not occur: Safety/medical concerns         Walk 150 feet activity   Assist Walk 150 feet activity did not occur: Safety/medical concerns         Walk 10 feet on uneven surface  activity   Assist Walk 10 feet on uneven surfaces activity did not occur: Safety/medical concerns         Wheelchair     Assist     Wheelchair activity did not occur: Safety/medical concerns         Wheelchair 50 feet with 2 turns activity    Assist    Wheelchair 50 feet with 2 turns activity did not occur: Safety/medical concerns       Wheelchair 150 feet activity     Assist Wheelchair 150 feet activity did not occur: Safety/medical concerns          Medical Problem List and Plan: 1.  Decreased functional mobility secondary to bilateral quadricep tendon rupture. Status post repair of bilateral tendon rupture repair 07/21/2018. Weightbearing as tolerated. Bilateral Bledsoe brace locked in extension             Continue CIR 2.  Antithrombotics: -DVT/anticoagulation:  Subcutaneous Lovenox 110 mg daily.   Lower extremity vascular ultrasound limited, but negative             -antiplatelet therapy: Not applicable 3. Pain Management:  Lidoderm patch,OxyContin sustained release 10 mg every 12 hours, oxycodone immediate release for breakthrough pain 4. Mood:  Zoloft 25 mg daily. Provide emotional support             -antipsychotic agents:  Not applicable 5. Neuropsych: This patient is capable of making decisions on his own behalf. 6. Skin/Wound Care: Stage I pressure ulcer. Follow-up wound care nurse Routine skin checks 7. Fluids/Electrolytes/Nutrition:  Routine interval mouth with follow-up  BMP within acceptable range on 2/26 8. Acute blood loss anemia. Transfused 1 unit packed red blood cells postoperatively.   Hemoglobin 8.4 on 2/26  Continue to monitor 9.  Super super obesity. Follow-up dietary services 10. New findingsType 2  diabetes mellitus. Hemoglobin A1c 7.1. Glucophage 500 mg daily Check blood sugars before meals and at bedtime. Diabetic teaching  Slightly labile, will consider BID dosing if persistently labile  Monitor with increased mobility 11. Acute otitis media. Augmentin 875-125 mg initiated 07/30/2018. 12. Constipation. Laxative assistance 13. Asthma/OSA/tobacco/ marijuana abuse. CPAP as directed.Continue Symbicort as well as nebulizer treatments. Provide counseling regards to tobacco abuse 14.  Hypoalbuminemia  Supplement initiated on 2/26 15.  Labile blood pressure  Controlled on 2/28 16.  Sleep disturbance  Restoril started on 2/27  Improved 17. Sore throat  Chloraseptic spray ordered  LOS: 3 days A FACE TO  FACE EVALUATION WAS PERFORMED  Ankit Karis Juba 08/04/2018, 11:24 AM

## 2018-08-04 NOTE — Progress Notes (Signed)
Physical Therapy Session Note  Patient Details  Name: Larry Fowler MRN: [id number] Date of Birth: [birth year]  Today's Date: 08/04/2018 PT Individual Time: 1300-1415 PT Individual Time Calculation (min): 75 min   Short Term Goals: Week 1:  PT Short Term Goal 1 (Week 1): Pt will perform sit<>supine with mod assist  PT Short Term Goal 2 (Week 1): Pt will remain standing x 5 minutes on TIS bed.  PT Short Term Goal 3 (Week 1): Pt will perform sit<>stand from elevated surface with max + 2 assist    Skilled Therapeutic Interventions/Progress Updates:   Pt received sitting in bariatric chair and agreeable to PT. Maxi sky transfer to [facility] shower chair for bowel movement. Sitting in [place] power chair x 5mnutes with BLE supported on arm chairs. No increased pain in BLE while sitting in Chair with UE supported on Maxi sky sling. Peri care completed by therapist while sitting on chair. Maxi sky transfer to VLeolabed. Rolling in bed with min assist from PT. Pt then noted air leaking out of bed. Maxi Sky transfer to Bariatric air bed. Scooting in bed with min assist and heavy use of head board. Pt left in bed with call bell in reach and all needs met.       Therapy Documentation Precautions:  Precautions Precautions: Fall Required Braces or Orthoses: Other Brace(Bledsoe braces bilaterally locked into 0 extension) Knee Immobilizer - Right: On at all times Knee Immobilizer - Left: On at all times Other Brace: Bilateral Bledsoe braces locked in extension Restrictions Weight Bearing Restrictions: No RLE Weight Bearing: Weight bearing as tolerated LLE Weight Bearing: Weight bearing as tolerated Vital Signs: Therapy Vitals Temp: 98 F (36.7 C) Temp Source: Oral Pulse Rate: 68 Resp: 18 Patient Position (if appropriate): Lying Oxygen Therapy SpO2: 98 % Pain: Pain Assessment Pain Scale: 0-10 Pain Score: 0-No pain    Therapy/Group: Individual Therapy  ALorie Phenix2/28/2020, 4:33 PM

## 2018-08-04 NOTE — Progress Notes (Signed)
Pt continues to be weepy and anxious. Pt uses frequent profanity and inappropriate social behavior. Pt is continent of B/B pain controlled with OxyContin scheduled and prn. Pt's ace wraps and braces removed at 2000 per pt request. Wounds cleansed sutures intact site clean and dry with approximated edges. Pt requested that his "legs air out". Ace wraps reapplied at 2300 with dressing done per order. Pt has slept well with Restoril. Medicated for pain this morning with relief. Resting currently and appears comfortable.

## 2018-08-04 NOTE — Progress Notes (Signed)
Occupational Therapy Session Note  Patient Details  Name: Larry Fowler MRN: [id number] Date of Birth: 08/31/1970  Today's Date: 08/04/2018 OT Individual Time: 3354-5625 & 1100-1215 OT Individual Time Calculation (min): 75 min & 75 min   Short Term Goals: Week 1:  OT Short Term Goal 1 (Week 1): Pt will tolerate sitting EOB for UB selfcare with supervision for 15 mins.   OT Short Term Goal 2 (Week 1): Pt will perform LB bathing supine to sit with mod assist.  OT Short Term Goal 3 (Week 1): Pt will tolerate tilted bed up to 75 degrees for at least 2 mins in preparation for  OT Short Term Goal 4 (Week 1): Pt will use sockaide to donn gripper socks in sitting with setup only.   OT Short Term Goal 5 (Week 1): Pt will perform stand pivot transfer to bariatric 3:1 with total assist +2 (pt 30%).  Skilled Therapeutic Interventions/Progress Updates:    8:10 am session - Patient in bed completing BM - dependent for hygiene, Self care completed at bed level today :  bathing completed - max A overall due to limited reach and body habitus.  UB dressing with min A to pull down shirt in back.  LB dressing max / dep.  Oral care requires set up, eating independent.  Completed 15 minutes of UE conditioning exercises with weighted dowel and ball activity.  11:00 am session - Patient in bed and ready for therapy session - requires A to adjust bilateral LE braces and immobilizers, apply strapping for support/set up bed for standing activity - he is able to manage controls of the bed to eventually achieve max angle of 80 degrees.  He maintained the max angle for approximately 3 minutes before returning to supine position.  He notes occ light headed feeling but states that he feels better than last attempt to get to standing position. Completed OH lift transfer from bed to bariatric commode (requires assist of 3 to manage bilateral LEs) propped legs on chair while seated on commode.  Moved from commode to bariatric  reclining chair via lift with assist of 3 where he was able to manage chair adjustments and maintain bilateral LEs on elevated leg rests.  He remained in chair at this time with call bell and controls for chair in hand.  He states that he is pleased with tasks completed this morning.    Therapy Documentation Precautions:  Precautions Precautions: Fall Required Braces or Orthoses: Other Brace(Bledsoe braces bilaterally locked into 0 extension) Knee Immobilizer - Right: On at all times Knee Immobilizer - Left: On at all times Other Brace: Bilateral Bledsoe braces locked in extension Restrictions Weight Bearing Restrictions: No RLE Weight Bearing: Weight bearing as tolerated LLE Weight Bearing: Weight bearing as tolerated General:   Vital Signs: Therapy Vitals Temp: 98 F (36.7 C) Temp Source: Oral Pulse Rate: 68 Resp: 18 Patient Position (if appropriate): Lying Oxygen Therapy SpO2: 98 % Pain: Pain Assessment Pain Scale: 0-10 Pain Score: 0-No pain   Other Treatments:     Therapy/Group: Individual Therapy  Barrie Lyme 08/04/2018, 3:59 PM

## 2018-08-04 NOTE — Progress Notes (Signed)
Social Work  Social Work Assessment and Plan  Patient Details  Name: Larry Fowler MRN: [id number] Date of Birth: 1971-05-10  Today's Date: 08/04/2018  Problem List:  Patient Active Problem List   Diagnosis Date Noted  . Sleep disturbance   . Labile blood pressure   . Hypoalbuminemia due to protein-calorie malnutrition (HCC)   . New onset type 2 diabetes mellitus (HCC)   . Marijuana abuse   . Drug induced constipation   . Otitis media   . Acute blood loss anemia   . Pressure injury of skin 07/30/2018  . Anxiety and depression   . Super-super obese (HCC)   . Postoperative pain   . Tobacco abuse   . Postoperative anemia due to acute blood loss 07/23/2018    Class: Acute  . Rupture of left quadriceps muscle   . Quadriceps tendon rupture 07/20/2018  . Nutritional counseling 07/03/2018  . Disorder of tendon of left biceps 12/05/2017  . Hypogonadism in male 10/25/2017  . Hyperglycemia   . Preventative health care 03/21/2017  . Morbid obesity (HCC) 03/21/2017  . OSA (obstructive sleep apnea) 03/21/2017  . Chronic pain 03/21/2017  . Smoker 03/21/2017  . HTN (hypertension) 03/21/2017  . Fatigue 03/21/2017  . Left inguinal hernia 03/21/2017  . Rash 03/21/2017  . History of cocaine use   . Cannabis abuse   . Renal stone   . Anal abscess, ? fistula 04/05/2011  . Asthma 12/17/2009  . 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 DEPRESSION 09/12/2007  . Allergic rhinitis 09/12/2007  . ANXIETY 02/16/2007   Past Medical History:  Past Medical History:  Diagnosis Date  . ALLERGIC RHINITIS 09/12/2007   Qualifier: Diagnosis of  By: Jonny Ruiz MD, Len Blalock   . ANXIETY 02/16/2007   Qualifier: Diagnosis of  By: Tyrone Apple, Taressa Rauh    . ASTHMA 12/17/2009   Qualifier: Diagnosis of  By: Jonny Ruiz MD, Len Blalock   . Cannabis abuse    currently  . Chills with fever   . DEPRESSION 09/12/2007   Qualifier: Diagnosis of  By: Jonny Ruiz MD, Len Blalock   . History of cocaine use    in his 55's  . Hyperglycemia    . Rectal fistula 2009  . Rectal pain   . Renal stone    2008   Past Surgical History:  Past Surgical History:  Procedure Laterality Date  . FINGER SURGERY  2008/09?   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 knee surgury     ? which knee per Dr Thurston Hole in his teens  . REPAIR QUADRICEPS/HAMSTRING MUSCLES Bilateral 07/21/2018   Procedure: REPAIR BILATERAL QUADRICEPS;  Surgeon: Tarry Kos, MD;  Location: [facility] OR;  Service: Orthopedics;  Laterality: Bilateral;  . TONSILLECTOMY     Social History:  reports that he has been smoking. He has been smoking about 0.25 packs per day. He has never used smokeless tobacco. He reports current alcohol use. He reports current drug use. Drug: Marijuana.  Family / Support Systems Marital Status: Married How Long?: 4 yrs Patient Roles: Spouse Spouse/Significant Other: wife, Latavion Brittan @ (C) [phone] Children: none Other Supports: friends. in laws Anticipated Caregiver: wife Ability/Limitations of Caregiver: wife works days Caregiver Availability: Intermittent(wife notes her mother might be able to assist with supervision ) Family Dynamics: Wife very supportive and encouraging.  Notes that she does have local family who may be able/ willing to assist if needed.  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 Legal History/Current Legal Issues: None Guardian/Conservator: None - per MD, pt is fully capable of making decisions on his own behalf.   Abuse/Neglect Abuse/Neglect Assessment Can Be Completed: Yes Physical Abuse: Denies Verbal Abuse: Denies Sexual Abuse: Denies Exploitation of patient/patient's resources: Denies Self-Neglect:  Denies  Emotional Status Pt's affect, behavior and adjustment status: Pt lying in bed and initially reports feeling dizzy, however, after taking a minute to relax himself he then states he feels fine to complete interview.  Completes assessment interview without difficulty.  Admits much frustration with his situation, however, denies any significant emotional distress.  Will monitor throughout stay and refer for neuropsychology as indicated. Recent Psychosocial Issues: None Psychiatric History: Pt  with noted h/o depression and meds managed by primary MD Substance Abuse History: NA  Patient / Family Perceptions, Expectations & Goals Pt/Family understanding of illness & functional limitations: Pt and wife with good, general understanding of pt's injuries and current functional limitations and restricitions/ need for CIR. Premorbid pt/family roles/activities: Pt completely independently and working f/t. Anticipated changes in roles/activities/participation: Per goals of min/ mod assist, wife and family will need to coordinate 24/7 support unless pt is able to reach a higher level of function. Pt/family expectations/goals: "I just want to be able to get around as best as I can."  [employer]: None Premorbid Home Care/DME Agencies: None Transportation available at discharge: yes Resource referrals recommended: Neuropsychology  Discharge Planning Living Arrangements: Spouse/significant other Support Systems: Spouse/significant other, Other relatives, Friends/neighbors Type of Residence: Private residence Insurance Resources: Media planner (specify)([employer]) Financial Resources: Employment Financial Screen Referred: No Living Expenses: Psychologist, sport and exercise Management: Spouse, Patient Does the patient have any problems obtaining your medications?: No Home Management: pt and wife share responsibilities Patient/Family Preliminary Plans: Pt to d/c home with wife as  primary support. Will need to coordinate others to cover a 24/7 period Sw Barriers to Discharge: Lack of/limited family support, Inaccessible home environment Sw Barriers to Discharge Comments: Have asked wife to begin thinking about and talking with others in order to cover 24/7.  Wife was made aware by [facility] and myself that a ramp needs to be put into place ASAP. Social Work Anticipated Follow Up Needs: SNF Expected length of stay: 19-21 days  Clinical Impression Very unfortunate gentleman here who suffered bil quad tendon ruptures in a fall at his work.  Co-morbidities are also limiting factors.  Does have good support from his wife and friends, however, no confirmed plan of 24/7 support yet in place.  Pt very frustrated with is situation and may benefit from additional neuropsychology support while here.  Pt and wife aware home accessibility will also need to be addressed and ramp needs to be planned/ installed ASAP.  Will follow for support and d/c planning needs.  Yovani Cogburn 08/04/2018, 11:03 AM

## 2018-08-05 ENCOUNTER — Inpatient Hospital Stay (HOSPITAL_COMMUNITY): Payer: Self-pay | Admitting: Occupational Therapy

## 2018-08-05 ENCOUNTER — Inpatient Hospital Stay (HOSPITAL_COMMUNITY): Payer: Self-pay | Admitting: Physical Therapy

## 2018-08-05 LAB — GLUCOSE, CAPILLARY
GLUCOSE-CAPILLARY: 124 mg/dL — AB (ref 70–99)
GLUCOSE-CAPILLARY: 146 mg/dL — AB (ref 70–99)
GLUCOSE-CAPILLARY: 119 mg/dL — AB (ref 70–99)
Glucose-Capillary: 142 mg/dL — ABNORMAL HIGH (ref 70–99)

## 2018-08-05 MED ORDER — GERHARDT'S BUTT CREAM
TOPICAL_CREAM | Freq: Two times a day (BID) | CUTANEOUS | Status: DC
  Administered 2018-08-05 – 2018-08-28 (×36): via TOPICAL
  Filled 2018-08-05 (×2): qty 1

## 2018-08-05 NOTE — Progress Notes (Signed)
Pt behavior is improved today pt is using little profanity. Pt's pain has been controlled. Pt is being turned and positioned every 2 hours. Pt is intermittently wearing CPAP. Resting and appears comfortable at present

## 2018-08-05 NOTE — Progress Notes (Signed)
[place] PHYSICAL MEDICINE & REHABILITATION PROGRESS NOTE  Subjective/Complaints:  Was not toileted on commode last noc, safety plan indicates maxi ski lift x 3 for commode transfers    ROS:  Denies CP, shortness of breath, nausea, vomiting, diarrhea.  Objective: Vital Signs: Blood pressure 130/64, pulse 71, temperature 98.4 F (36.9 C), resp. rate 18, height 6\' 2"  (1.88 m), weight (!) 226.9 kg, SpO2 95 %. No results found. No results for input(s): WBC, HGB, HCT, PLT in the last 72 hours. No results for input(s): NA, K, CL, CO2, GLUCOSE, BUN, CREATININE, CALCIUM in the last 72 hours.  Physical Exam: BP 130/64 (BP Location: Right Wrist)   Pulse 71   Temp 98.4 F (36.9 C)   Resp 18   Ht 6\' 2"  (1.88 m)   Wt (!) 226.9 kg   SpO2 95%   BMI 64.22 kg/m  Constitutional: No distress . Vital signs reviewed.  Morbidly obese. HENT: Normocephalic.  Atraumatic. Eyes: EOMI. No discharge. Cardiovascular: RRR. No JVD. Respiratory: CTA bilaterally.  Normal effort. GI: BS +. Non-distended. Musc: Bilateral lower edema and tenderness  Neurological: He is alert and oriented Motor: Bilateral upper extremities: 5/5 proximal to distal Right lower extremity: Hip flexion 3/5, Knee extension left, ankle dorsiflexion 5/5, stable Left lower extremity: Hip flexion 3+-4-/5, knee extension locked in brace, ankle dorsiflexion 5/5, stable Skin: Bilateral Bledsoe braces in place with dressing C/D/I  Psychiatric: He has a normal mood and affect. His behavior is normal  Assessment/Plan: 1. 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  Physiatrist is providing close team supervision and 24 hour management of active medical problems listed below.  Physiatrist and rehab team continue to assess barriers to discharge/monitor patient progress toward functional and medical  goals  Care Tool:  Bathing    Body parts bathed by patient: Right arm, Left arm, Chest, Abdomen, Face   Body parts bathed by helper: Buttocks, Front perineal area, Right lower leg, Left upper leg, Right upper leg, Left lower leg     Bathing assist Assist Level: Maximal Assistance - Patient 24 - 49%     Upper Body Dressing/Undressing Upper body dressing   What is the patient wearing?: Pull over shirt    Upper body assist Assist Level: Minimal Assistance - Patient > 75%    Lower Body Dressing/Undressing Lower body dressing      What is the patient wearing?: Pants     Lower body assist Assist for lower body dressing: Total Assistance - Patient < 25%     Toileting Toileting    Toileting assist Assist for toileting: Total Assistance - Patient < 25%     Transfers Chair/bed transfer  Transfers assist  Chair/bed transfer activity did not occur: Safety/medical concerns  Chair/bed transfer assist level: Dependent - mechanical lift     Locomotion Ambulation   Ambulation assist   Ambulation activity did not occur: Safety/medical concerns          Walk 10 feet activity   Assist  Walk 10 feet activity did not occur: Safety/medical concerns        Walk 50 feet activity   Assist Walk 50 feet with 2 turns activity did not occur: Safety/medical concerns         Walk 150 feet activity   Assist Walk 150 feet activity did not occur: Safety/medical concerns  Walk 10 feet on uneven surface  activity   Assist Walk 10 feet on uneven surfaces activity did not occur: Safety/medical Engineer, technical sales activity did not occur: Safety/medical concerns         Wheelchair 50 feet with 2 turns activity    Assist    Wheelchair 50 feet with 2 turns activity did not occur: Safety/medical concerns       Wheelchair 150 feet activity     Assist Wheelchair 150 feet activity did not occur:  Safety/medical concerns          Medical Problem List and Plan: 1.  Decreased functional mobility secondary to bilateral quadricep tendon rupture. Status post repair of bilateral tendon rupture repair 07/21/2018. Weightbearing as tolerated. Bilateral Bledsoe brace locked in extension             Continue PT. OT 2.  Antithrombotics: -DVT/anticoagulation:  Subcutaneous Lovenox 110 mg daily.   Lower extremity vascular ultrasound limited, but negative             -antiplatelet therapy: Not applicable 3. Pain Management:  Lidoderm patch,OxyContin sustained release 10 mg every 12 hours, oxycodone immediate release for breakthrough pain 4. Mood:  Zoloft 25 mg daily. Provide emotional support             -antipsychotic agents:  Not applicable 5. Neuropsych: This patient is capable of making decisions on his own behalf. 6. Skin/Wound Care: Stage I pressure ulcer. Follow-up wound care nurse Routine skin checks 7. Fluids/Electrolytes/Nutrition:  Routine interval mouth with follow-up  BMP within acceptable range on 2/26 8. Acute blood loss anemia. Transfused 1 unit packed red blood cells postoperatively.   Hemoglobin 8.4 on 2/26  Continue to monitor 9.  Super super obesity. Follow-up dietary services 10. New findingsType 2 diabetes mellitus. Hemoglobin A1c 7.1. Glucophage 500 mg daily Check blood sugars before meals and at bedtime. Diabetic teaching  Slightly labile, will consider BID dosing if persistently labile  Monitor with increased mobility 11. Acute otitis media. Augmentin 875-125 mg initiated 07/30/2018. 12. Constipation. Laxative assistance 13. Asthma/OSA/tobacco/ marijuana abuse. CPAP as directed.Continue Symbicort as well as nebulizer treatments. Provide counseling regards to tobacco abuse 14.  Hypoalbuminemia  Supplement initiated on 2/26 15.  Labile blood pressure   Vitals:   08/04/18 2118 08/05/18 0553  BP: 132/68 130/64  Pulse: 74 71  Resp:  18  Temp:  98.4 F (36.9 C)   SpO2:  95%  improved 2/29 16.  Sleep disturbance  Restoril started on 2/27  Improved 17. Sore throat  Chloraseptic spray ordered  LOS: 4 days A FACE TO FACE EVALUATION WAS PERFORMED  Erick Colace 08/05/2018, 9:23 AM

## 2018-08-05 NOTE — Progress Notes (Signed)
Physical Therapy Session Note  Patient Details  Name: Larry Fowler MRN: [id number] Date of Birth: 18-Oct-1970  Today's Date: 08/05/2018 PT Individual Time: 1310-1400 PT Individual Time Calculation (min): 50 min   Short Term Goals: Week 1:  PT Short Term Goal 1 (Week 1): Pt will perform sit<>supine with mod assist  PT Short Term Goal 2 (Week 1): Pt will remain standing x 5 minutes on TIS bed.  PT Short Term Goal 3 (Week 1): Pt will perform sit<>stand from elevated surface with max + 2 assist    Skilled Therapeutic Interventions/Progress Updates: Pt presented in recliner agreeable to therapy. Pt requesting to attempt w/c mobility outside with PTA agreeable. Lift initiated via [facility] to transfer to w/c. Pt then indicating intense pain in RLQ with no relief to positioning. Per pt new pain, notified nsg and discussed with pt. Pt agreeable to attempt toileting.Due to increased pain pt refused shower chair/commode, thus pt returned to bed and placed on bedpan by nsg. Pt was able to roll L/R minA for LE management (+BM), PTA performed peri-care dependent. Once in supine bed placed in Trendelenburg and pt able to scoot to [facility]. Pt left with call bell within reach and current needs met.       Therapy Documentation Precautions:  Precautions Precautions: Fall Precaution Comments: NO knee flexion Required Braces or Orthoses: Other Brace Knee Immobilizer - Right: On at all times Knee Immobilizer - Left: On at all times Other Brace: Bilateral Bledsoe braces locked in extension Restrictions Weight Bearing Restrictions: No RLE Weight Bearing: Weight bearing as tolerated LLE Weight Bearing: Weight bearing as tolerated General: PT Amount of Missed Time (min): 10 Minutes PT Missed Treatment Reason: Other (Comment)(late meal) Vital Signs:   Pain: Pain Assessment Pain Scale: Faces Faces Pain Scale: Hurts little more Pain Type: Acute pain Pain Location: Knee Pain Orientation: Right;Left Pain  Descriptors / Indicators: Discomfort Pain Onset: With Activity Pain Intervention(s): Repositioned    Therapy/Group: Individual Therapy  Braylee Lal  Nicholus Chandran, PTA  08/05/2018, 2:01 PM

## 2018-08-05 NOTE — Progress Notes (Signed)
Physical Therapy Session Note  Patient Details  Name: Larry Fowler MRN: [id number] Date of Birth: 1971-04-22  Today's Date: 08/05/2018 PT Individual Time: 1000-1110 PT Individual Time Calculation (min): 70 min   Short Term Goals: Week 1:  PT Short Term Goal 1 (Week 1): Pt will perform sit<>supine with mod assist  PT Short Term Goal 2 (Week 1): Pt will remain standing x 5 minutes on TIS bed.  PT Short Term Goal 3 (Week 1): Pt will perform sit<>stand from elevated surface with max + 2 assist    Skilled Therapeutic Interventions/Progress Updates:   Pt received sitting in bariatric chair and agreeable to PT. Pt reports need for BM. Lateral weight shift to remove pants with mod assist from PT to clear gluteal surrface and max assist to pull over Bil bledsoes. Maxi sky transfer to baritric shower chair for bowel movement and urination. Pt able to have continent bowel movement and urination. Peri care completed by PT. Maxi sky transfer to bed. Rolling R and L with mod assist to pull pants to waist by PT. Maxi sky transfer to Bariatric WC. WC mobility instructed by PT x 145f with supervision assist and min cues for equal use of BLE. PT required to stabilize L ELR with tband due to poor fit on hinge. Pt returned to room and performed maxi sky [place] to [place]. call bell in reach and all needs met.        Therapy Documentation Precautions:  Precautions Precautions: Fall Precaution Comments: NO knee flexion Required Braces or Orthoses: Other Brace Knee Immobilizer - Right: On at all times Knee Immobilizer - Left: On at all times Other Brace: Bilateral Bledsoe braces locked in extension Restrictions Weight Bearing Restrictions: No RLE Weight Bearing: Weight bearing as tolerated LLE Weight Bearing: Weight bearing as tolerated General: PT Amount of Missed Time (min): 10 Minutes PT Missed Treatment Reason: Other (Comment)(late meal) Vital Signs: Therapy Vitals Temp: 98.5 F (36.9  C) Pulse Rate: 71 Resp: 16 BP: (!) 100/57 Patient Position (if appropriate): Lying Oxygen Therapy SpO2: 99 % O2 Device: Room Air Pain: Pain Assessment Pain Scale: Faces Faces Pain Scale: Hurts little more Pain Type: Acute pain Pain Location: Knee Pain Orientation: Right;Left Pain Descriptors / Indicators: Discomfort Pain Onset: With Activity Pain Intervention(s): Repositioned    Therapy/Group: Individual Therapy  ALorie Phenix2/29/2020, 2:34 PM

## 2018-08-05 NOTE — Progress Notes (Signed)
Occupational Therapy Session Note  Patient Details  Name: Larry Fowler MRN: [id number] Date of Birth: 05/23/1971  Today's Date: 08/05/2018 OT Individual Time: 8088-1103 OT Individual Time Calculation (min): 75 min    Short Term Goals: Week 1:  OT Short Term Goal 1 (Week 1): Pt will tolerate sitting EOB for UB selfcare with supervision for 15 mins.   OT Short Term Goal 2 (Week 1): Pt will perform LB bathing supine to sit with mod assist.  OT Short Term Goal 3 (Week 1): Pt will tolerate tilted bed up to 75 degrees for at least 2 mins in preparation for  OT Short Term Goal 4 (Week 1): Pt will use sockaide to donn gripper socks in sitting with setup only.   OT Short Term Goal 5 (Week 1): Pt will perform stand pivot transfer to bariatric 3:1 with total assist +2 (pt 30%).  Skilled Therapeutic Interventions/Progress Updates:    Pt completed ADL mostly in the bed during session.  He was able to wash all of his UB with setup as well as for applying deodorant.  Total assist for washing peri area both front and back as well as for managing urinal.  He was able to roll side to side in the bed with min assist for therapist to complete this.  He needed total assist for donning shorts over LEs in supine with total assist as well for donning them over his hips.  Utilized maxi sky for transfer from the bed to the bariatric recliner.  Once in the recliner, he was able to donn a pullover shirt with min assist.  Pt left in chair with call button and phone in reach and MD present.    Therapy Documentation Precautions:  Precautions Precautions: Fall Precaution Comments: NO knee flexion Required Braces or Orthoses: Other Brace Knee Immobilizer - Right: On at all times Knee Immobilizer - Left: On at all times Other Brace: Bilateral Bledsoe braces locked in extension Restrictions Weight Bearing Restrictions: No RLE Weight Bearing: Weight bearing as tolerated LLE Weight Bearing: Weight bearing as  tolerated   Pain: Pain Assessment Pain Scale: Faces Faces Pain Scale: Hurts little more Pain Type: Acute pain Pain Location: Knee Pain Orientation: Right;Left Pain Descriptors / Indicators: Discomfort Pain Onset: With Activity Pain Intervention(s): Repositioned ADL: See Care Tool Section for some ADL details  Therapy/Group: Individual Therapy  Larry Fowler OTR/L 08/05/2018, 12:13 PM

## 2018-08-06 ENCOUNTER — Inpatient Hospital Stay (HOSPITAL_COMMUNITY): Payer: Self-pay

## 2018-08-06 LAB — GLUCOSE, CAPILLARY
GLUCOSE-CAPILLARY: 131 mg/dL — AB (ref 70–99)
GLUCOSE-CAPILLARY: 132 mg/dL — AB (ref 70–99)
Glucose-Capillary: 117 mg/dL — ABNORMAL HIGH (ref 70–99)
Glucose-Capillary: 98 mg/dL (ref 70–99)

## 2018-08-06 MED ORDER — MOMETASONE FURO-FORMOTEROL FUM 100-5 MCG/ACT IN AERO
2.0000 | INHALATION_SPRAY | Freq: Two times a day (BID) | RESPIRATORY_TRACT | Status: DC
  Administered 2018-08-07 – 2018-08-08 (×2): 2 via RESPIRATORY_TRACT
  Filled 2018-08-06: qty 8.8

## 2018-08-06 MED ORDER — OXYCODONE HCL 5 MG PO TABS
10.0000 mg | ORAL_TABLET | ORAL | Status: DC | PRN
  Administered 2018-08-06 – 2018-08-23 (×37): 10 mg via ORAL
  Administered 2018-08-24: 5 mg via ORAL
  Administered 2018-08-25: 10 mg via ORAL
  Administered 2018-08-26: 5 mg via ORAL
  Administered 2018-08-26: 10 mg via ORAL
  Administered 2018-08-27: 5 mg via ORAL
  Administered 2018-08-28 (×2): 10 mg via ORAL
  Filled 2018-08-06 (×48): qty 2

## 2018-08-06 MED ORDER — OXYCODONE HCL ER 10 MG PO T12A
10.0000 mg | EXTENDED_RELEASE_TABLET | Freq: Every day | ORAL | Status: DC
  Administered 2018-08-07 – 2018-08-09 (×3): 10 mg via ORAL
  Filled 2018-08-06 (×4): qty 1

## 2018-08-06 NOTE — Progress Notes (Addendum)
[facility] PHYSICAL MEDICINE & REHABILITATION PROGRESS NOTE  Subjective/Complaints:  Took no long or short acting pain med yesterday during the day , in the evening c/o "crawling out of my skin" has been on hi dose pain meds x >2wks  ROS:  Denies CP, shortness of breath, nausea, vomiting, diarrhea.  Objective: Vital Signs: Blood pressure 132/72, pulse 72, temperature 98.2 F (36.8 C), resp. rate 16, height 6\' 2"  (1.88 m), weight (!) 226.9 kg, SpO2 95 %. No results found. No results for input(s): WBC, HGB, HCT, PLT in the last 72 hours. No results for input(s): NA, K, CL, CO2, GLUCOSE, BUN, CREATININE, CALCIUM in the last 72 hours.  Physical Exam: BP 132/72 (BP Location: Right Wrist)   Pulse 72   Temp 98.2 F (36.8 C)   Resp 16   Ht 6\' 2"  (1.88 m)   Wt (!) 226.9 kg   SpO2 95%   BMI 64.22 kg/m  Constitutional: No distress . Vital signs reviewed.  Morbidly obese. HENT: Normocephalic.  Atraumatic. Eyes: EOMI. No discharge. Cardiovascular: RRR. No JVD. Respiratory: CTA bilaterally.  Normal effort. GI: BS +. Non-distended. Musc: Bilateral lower edema and tenderness  Neurological: He is alert and oriented Motor: Bilateral upper extremities: 5/5 proximal to distal Right lower extremity: Hip flexion 3/5, Knee extension left, ankle dorsiflexion 5/5, stable Left lower extremity: Hip flexion 3+-4-/5, knee extension locked in brace, ankle dorsiflexion 5/5, stable Skin: Bilateral Bledsoe braces in place with dressing C/D/I     Psychiatric: He has a normal mood and affect. His behavior is normal  Assessment/Plan: 1. 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  Physiatrist is providing close team supervision and 24 hour management of active medical problems listed below.  Physiatrist and rehab team continue to assess barriers to  discharge/monitor patient progress toward functional and medical goals  Care Tool:  Bathing    Body parts bathed by patient: Right arm, Left arm, Chest, Abdomen, Face   Body parts bathed by helper: Buttocks, Front perineal area, Left upper leg, Right upper leg Body parts n/a: Left upper leg, Right lower leg(Did not attempt washing feet this session)   Bathing assist Assist Level: Maximal Assistance - Patient 24 - 49%     Upper Body Dressing/Undressing Upper body dressing   What is the patient wearing?: Pull over shirt    Upper body assist Assist Level: Minimal Assistance - Patient > 75%    Lower Body Dressing/Undressing Lower body dressing      What is the patient wearing?: Pants     Lower body assist Assist for lower body dressing: Total Assistance - Patient < 25%     Toileting Toileting    Toileting assist Assist for toileting: 2 Helpers     Transfers Chair/bed transfer  Transfers assist  Chair/bed transfer activity did not occur: Safety/medical concerns  Chair/bed transfer assist level: 2 Helpers     Locomotion Ambulation   Ambulation assist   Ambulation activity did not occur: Safety/medical concerns          Walk 10 feet activity   Assist  Walk 10 feet activity did not occur: Safety/medical concerns        Walk 50 feet activity   Assist Walk 50 feet with 2 turns activity did not occur: Safety/medical concerns         Walk 150 feet activity   Assist Walk 150  feet activity did not occur: Safety/medical concerns         Walk 10 feet on uneven surface  activity   Assist Walk 10 feet on uneven surfaces activity did not occur: Safety/medical concerns         Wheelchair     Assist   Type of Wheelchair: Manual Wheelchair activity did not occur: Safety/medical concerns  Wheelchair assist level: Supervision/Verbal cueing Max wheelchair distance: 143ft    Wheelchair 50 feet with 2 turns activity    Assist      Wheelchair 50 feet with 2 turns activity did not occur: Safety/medical concerns   Assist Level: Supervision/Verbal cueing   Wheelchair 150 feet activity     Assist Wheelchair 150 feet activity did not occur: Safety/medical concerns          Medical Problem List and Plan: 1.  Decreased functional mobility secondary to bilateral quadricep tendon rupture. Status post repair of bilateral tendon rupture repair 07/21/2018. Weightbearing as tolerated. Bilateral Bledsoe brace locked in extension             Continue PT. OT 2.  Antithrombotics: -DVT/anticoagulation:  Subcutaneous Lovenox 110 mg daily.   Lower extremity vascular ultrasound limited, but negative             -antiplatelet therapy: Not applicable 3. Pain Management:  Lidoderm patch,OxyContin sustained release 10 mg BID Wean oxycontin to qhs , oxycodone immediate release for breakthrough pain 4. Mood:  Zoloft 25 mg daily. Provide emotional support             -antipsychotic agents:  Not applicable 5. Neuropsych: This patient is capable of making decisions on his own behalf. 6. Skin/Wound Care: Stage I pressure ulcer. Follow-up wound care nurse Routine skin checks 7. Fluids/Electrolytes/Nutrition:  Routine interval mouth with follow-up  BMP within acceptable range on 2/26 8. Acute blood loss anemia. Transfused 1 unit packed red blood cells postoperatively.   Hemoglobin 8.4 on 2/26  Continue to monitor 9.  Super super obesity. Follow-up dietary services 10. New findingsType 2 diabetes mellitus. Hemoglobin A1c 7.1. Glucophage 500 mg daily Check blood sugars before meals and at bedtime. Diabetic teaching    Monitor with increased mobility CBG (last 3)  Recent Labs    08/05/18 1645 08/05/18 2102 08/06/18 0650  GLUCAP 146* 119* 117*  Good control 3/1 11. Acute otitis media. Augmentin 875-125 mg initiated 07/30/2018. 12. Constipation. Laxative assistance 13. Asthma/OSA/tobacco/ marijuana abuse. CPAP as directed.Continue  Symbicort as well as nebulizer treatments. Provide counseling regards to tobacco abuse 14.  Hypoalbuminemia  Supplement initiated on 2/26 15.  Labile blood pressure   Vitals:   08/05/18 2026 08/06/18 0645  BP: (!) 114/49 132/72  Pulse: 63 72  Resp: 18 16  Temp: 98.9 F (37.2 C) 98.2 F (36.8 C)  SpO2: 99% 95%  improved 3/1 16.  Sleep disturbance  Restoril started on 2/27  Improved 17. Sore throat  Chloraseptic spray ordered  LOS: 5 days A FACE TO FACE EVALUATION WAS PERFORMED  Erick Colace 08/06/2018, 8:06 AM

## 2018-08-06 NOTE — Progress Notes (Signed)
Occupational Therapy Session Note  Patient Details  Name: Larry Fowler MRN: [id number] Date of Birth: 22-Sep-1970  Today's Date: 08/06/2018 OT Individual Time: 1300-1357 OT Individual Time Calculation (min): 57 min    Short Term Goals: Week 1:  OT Short Term Goal 1 (Week 1): Pt will tolerate sitting EOB for UB selfcare with supervision for 15 mins.   OT Short Term Goal 2 (Week 1): Pt will perform LB bathing supine to sit with mod assist.  OT Short Term Goal 3 (Week 1): Pt will tolerate tilted bed up to 75 degrees for at least 2 mins in preparation for  OT Short Term Goal 4 (Week 1): Pt will use sockaide to donn gripper socks in sitting with setup only.   OT Short Term Goal 5 (Week 1): Pt will perform stand pivot transfer to bariatric 3:1 with total assist +2 (pt 30%).  Skilled Therapeutic Interventions/Progress Updates:    1:1. Pt received in bed with no c/o pian, however pt requesting to void bladder. Total A provided to place urinal for continent bladder void. Pt completes log rolling in bed to advance pants past hips and place maxi sky sling with MIN A. Dependent transfer to w/c via maxi sky. Pt requesting to be taken outside with total A for energy conservation. Pt smiling and crying because he is "so happy to get fresh air." Pt completes UB therex with 5# dowel rod against resistance of orange theraband 2x15 shoudler fles/ ext, elbow flex/ext, row, chest  Press, and overhead press for BUE strengthening required for BADl and functional transfers. Pt propels w/c part way to/from outside courtyard with S. Pt requested to stay in  Sling hooked up to maxi sky when seated in w/c and educated pt that was not safe. Exited session with tp tseated in w/c, call light in reach all need smet.  Therapy Documentation Precautions:  Precautions Precautions: Fall Precaution Comments: NO knee flexion Required Braces or Orthoses: Other Brace Knee Immobilizer - Right: On at all times Knee Immobilizer -  Left: On at all times Other Brace: Bilateral Bledsoe braces locked in extension Restrictions Weight Bearing Restrictions: No RLE Weight Bearing: Weight bearing as tolerated LLE Weight Bearing: Weight bearing as tolerated Gener Therapy/Group: Individual Therapy  Shon Hale 08/06/2018, 1:58 PM

## 2018-08-07 ENCOUNTER — Telehealth (INDEPENDENT_AMBULATORY_CARE_PROVIDER_SITE_OTHER): Payer: Self-pay | Admitting: Orthopaedic Surgery

## 2018-08-07 ENCOUNTER — Inpatient Hospital Stay (HOSPITAL_COMMUNITY): Payer: Self-pay | Admitting: Occupational Therapy

## 2018-08-07 ENCOUNTER — Inpatient Hospital Stay (HOSPITAL_COMMUNITY): Payer: Self-pay | Admitting: Physical Therapy

## 2018-08-07 ENCOUNTER — Encounter (HOSPITAL_COMMUNITY): Payer: Self-pay | Admitting: Psychology

## 2018-08-07 DIAGNOSIS — S76111S Strain of right quadriceps muscle, fascia and tendon, sequela: Secondary | ICD-10-CM

## 2018-08-07 DIAGNOSIS — F339 Major depressive disorder, recurrent, unspecified: Secondary | ICD-10-CM

## 2018-08-07 DIAGNOSIS — F331 Major depressive disorder, recurrent, moderate: Secondary | ICD-10-CM

## 2018-08-07 DIAGNOSIS — L299 Pruritus, unspecified: Secondary | ICD-10-CM

## 2018-08-07 LAB — GLUCOSE, CAPILLARY
GLUCOSE-CAPILLARY: 127 mg/dL — AB (ref 70–99)
Glucose-Capillary: 136 mg/dL — ABNORMAL HIGH (ref 70–99)
Glucose-Capillary: 106 mg/dL — ABNORMAL HIGH (ref 70–99)
Glucose-Capillary: 135 mg/dL — ABNORMAL HIGH (ref 70–99)

## 2018-08-07 MED ORDER — DIPHENHYDRAMINE-ZINC ACETATE 2-0.1 % EX CREA
TOPICAL_CREAM | Freq: Two times a day (BID) | CUTANEOUS | Status: DC | PRN
  Filled 2018-08-07: qty 28

## 2018-08-07 NOTE — Telephone Encounter (Signed)
I will see him tomorrow over lunch time.

## 2018-08-07 NOTE — Progress Notes (Signed)
Patient has home CPAP set up at bedside. Patient does not need any assistance from RT at this time. Patient understands to call for Respiratory if assistance needed.

## 2018-08-07 NOTE — Progress Notes (Signed)
Occupational Therapy Session Note  Patient Details  Name: TERON KOSTOFF MRN: [id number] Date of Birth: 1971/04/10  Today's Date: 08/07/2018 OT Individual Time: 0900-1010 OT Individual Time Calculation (min): 70 min    Short Term Goals: Week 1:  OT Short Term Goal 1 (Week 1): Pt will tolerate sitting EOB for UB selfcare with supervision for 15 mins.   OT Short Term Goal 2 (Week 1): Pt will perform LB bathing supine to sit with mod assist.  OT Short Term Goal 3 (Week 1): Pt will tolerate tilted bed up to 75 degrees for at least 2 mins in preparation for  OT Short Term Goal 4 (Week 1): Pt will use sockaide to donn gripper socks in sitting with setup only.   OT Short Term Goal 5 (Week 1): Pt will perform stand pivot transfer to bariatric 3:1 with total assist +2 (pt 30%).  Skilled Therapeutic Interventions/Progress Updates:    Patient in bed and ready for therapy session.  ADL completed this am with sponge bath in bed and dressing completed at the bed level.  Max A for bathing, min A UB dressing, max/dep for LB dressing.  Rewrapped bilateral LEs and adjusted braces.  Rolling in bed with occ min A.  Planned to move to chair but patient with need to have a BM at end of session - he declined getting onto the shower commode chair, requesting to use bed pan as he was unsure of how long it would take to move his bowels and was concerned about being on the commode chair for an increased amount of time.  Bed pan placed and patient remained in bed with call bell to alert nursing when he was finished.    Therapy Documentation Precautions:  Precautions Precautions: Fall Precaution Comments: NO knee flexion Required Braces or Orthoses: Other Brace Knee Immobilizer - Right: On at all times Knee Immobilizer - Left: On at all times Other Brace: Bilateral Bledsoe braces locked in extension Restrictions Weight Bearing Restrictions: No RLE Weight Bearing: Weight bearing as tolerated LLE Weight Bearing:  Weight bearing as tolerated General:   Vital Signs:   Pain: Pain Assessment Pain Scale: 0-10 Pain Score: 0-No pain Faces Pain Scale: Hurts a little bit   Other Treatments:     Therapy/Group: Individual Therapy  Barrie Lyme 08/07/2018, 12:24 PM

## 2018-08-07 NOTE — Progress Notes (Signed)
Physical Therapy Session Note  Patient Details  Name: Larry Fowler MRN: [id number] Date of Birth: 02/06/1971  Today's Date: 08/07/2018 PT Individual Time: 1120-1215 PT Individual Time Calculation (min): 55 min   Short Term Goals: Week 1:  PT Short Term Goal 1 (Week 1): Pt will perform sit<>supine with mod assist  PT Short Term Goal 2 (Week 1): Pt will remain standing x 5 minutes on TIS bed.  PT Short Term Goal 3 (Week 1): Pt will perform sit<>stand from elevated surface with max + 2 assist    Skilled Therapeutic Interventions/Progress Updates: Pt presented in bariatric recliner chair agreeable to therapy. Pt denies pain at rest. Pt transported to rehab gym in chair and performed [facility] transfer to high/low mat +2. Sling then changed to bariatric standing sling and pt educated on benefit of sling for attempted stand. Pt required multiple attempts at repositioning and provided instruction on using forward momentum to attempt standing position +3 person arrived to stabilize heavy duty walker. Pt required multiple attempts due to increased anxiety to perform stand. It was also noted that pt was able to decrease BOS as pt re-adjusted position without significant increase in pain. Pt eventually able to achieve standing position >5 seconds due to significantly increased pain. Once pt returned to sitting pt at [facility] requiring Maxi Sky to reposition pt more appropriately onto mat. Sling exchanged and pt returned to recliner in same manner as prior. Pt returned to room and left with wife present and nsg notified as pt requesting medical intervention for pain management.      Therapy Documentation Precautions:  Precautions Precautions: Fall Precaution Comments: NO knee flexion Required Braces or Orthoses: Other Brace Knee Immobilizer - Right: On at all times Knee Immobilizer - Left: On at all times Other Brace: Bilateral Bledsoe braces locked in extension Restrictions Weight Bearing Restrictions:  No RLE Weight Bearing: Weight bearing as tolerated LLE Weight Bearing: Weight bearing as tolerated General:   Vital Signs:   Pain: Pain Assessment Pain Scale: 0-10 Pain Score: 9  Pain Intervention(s): Medication (See eMAR);Repositioned    Therapy/Group: Individual Therapy  Jaquila Santelli  Diago Haik, PTA  08/07/2018, 12:58 PM

## 2018-08-07 NOTE — Progress Notes (Signed)
Occupational Therapy Session Note  Patient Details  Name: Larry Fowler MRN: [id number] Date of Birth: [birth year]  Today's Date: 08/07/2018 OT Individual Time: 1415-1530 OT Individual Time Calculation (min): 75 min    Short Term Goals: Week 1:  OT Short Term Goal 1 (Week 1): Pt will tolerate sitting EOB for UB selfcare with supervision for 15 mins.   OT Short Term Goal 2 (Week 1): Pt will perform LB bathing supine to sit with mod assist.  OT Short Term Goal 3 (Week 1): Pt will tolerate tilted bed up to 75 degrees for at least 2 mins in preparation for  OT Short Term Goal 4 (Week 1): Pt will use sockaide to donn gripper socks in sitting with setup only.   OT Short Term Goal 5 (Week 1): Pt will perform stand pivot transfer to bariatric 3:1 with total assist +2 (pt 30%).  Skilled Therapeutic Interventions/Progress Updates:    patient seated in bari chair, wife present, he states that he is ready for therapy.  Declined standing in bed tilt position due to fatigue and strain on bilateral LEs earlier in therapy session.  Completed UE conditioning exercises with 5# dowel and theraband.  Completed hip mobility / weight shift exercises.  Completed core moblity/strengthening activities.  OH lift to bed with A of 2, pants down in bed and OH lift to commode chair for bowel movement.  Completed clean up (dependent) but patient requests to remain on commode as he feels that he may need to go more.  Wife present and will call nursing to assist with transfer off of commode.    Therapy Documentation Precautions:  Precautions Precautions: Fall Precaution Comments: NO knee flexion Required Braces or Orthoses: Other Brace Knee Immobilizer - Right: On at all times Knee Immobilizer - Left: On at all times Other Brace: Bilateral Bledsoe braces locked in extension Restrictions Weight Bearing Restrictions: No RLE Weight Bearing: Weight bearing as tolerated LLE Weight Bearing: Weight bearing as  tolerated General:   Vital Signs:   Pain: Pain Assessment Pain Scale: 0-10 Pain Score: 2  Pain Location: Leg Pain Orientation: Right;Left Pain Intervention(s): Repositioned   Other Treatments:     Therapy/Group: Individual Therapy  Barrie Lyme 08/07/2018, 3:41 PM

## 2018-08-07 NOTE — Telephone Encounter (Signed)
Larry Fowler called concerning her husband Larry Fowler he is at the hospital and she was asking if Dr. Roda Shutters or Mardella Layman is going to come to the hospital to evaluate him or what they needed to [name] # is [phone]

## 2018-08-07 NOTE — Consult Note (Signed)
Neuropsychological Consultation   Patient:   Larry Fowler   DOB:   26-Aug-1970  MR Number:  [id number]  Location:  [facility] [address] CENTER A [address] [id number] [place] [address] Dept: [phone] Loc: [phone]           Date of Service:   08/07/2018  Start Time:   9 AM End Time:   10 AM  Provider/Observer:  Arley Phenix, Psy.D.       Clinical Neuropsychologist       Billing Code/Service: [id number]  Chief Complaint:    Larry Fowler is a 48 year old right-handed male with a history of obstructive sleep apnea with CPAP use.  The patient also history of tobacco abuse, anxiety and depression, asthma, super morbid obesity with a BMI of 64.  The patient presented on 07/20/2018 after slipping and falling backwards with acute onset of bilateral knee pain.  CT of the knees done revealed bilateral quad tendon rupture and underwent repair on 07/21/2018 by Dr. Roda Shutters.  Postoperative weightbearing was tolerated with knee braces locked in extension.  There have been issues of ongoing pain management and the patient has had times of significant agitation and reports significant symptoms of depression and anxiety.  Reason for Service:  The patient was referred for neuropsychological consultation due to adjusting and coping with the extended hospital stay.  The patient has had times of emotional distress and agitation with a pre-existing history of depression anxiety.  Below is the HPI for the current admission.  HPI: Larry Fowler is a 48 year old right handed male with history of OSA-CPAP,tobacco abuse, anxiety/depression, asthma, super morbid obesity with BMI 64. Per report and patient, patient lives with spouse. 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 One level home with 2 steps to entry. Presented 07/20/2018 after slipping and falling backwards with acute onset of bilateral knee pain. CT of the knees done  revealing bilateral quad tendon rupture and underwent repair on 07/21/2018 by Dr.Xu. Postoperative weightbearing as tolerated with bilateral Bledsoe knee braces is locked in extension. Ongoing issues of pain management currently maintained on scheduled OxyContin as well as oxycodone immediate release for breakthrough pain. Acute blood loss anemia 7.8 was transfused 1 unit packed red blood cells with latest hemoglobin maintaining stable at 7.8.patient had been on subcutaneous .Currently maintained on Lovenox for DVT prophylaxis. Findings of elevated hemoglobin A1c of 7.1 and blood sugars monitored. Placed on Augmentin after patient spiked low-grade fevers findings of acute otitis media.Therapy evaluations completed with recommendations of physical medicine rehabilitation consult. Patient was admitted for a comprehensive rehabilitation program.  Current Status:  The patient was at times today quite emotional with extended periods of crying.  The patient acknowledged that he has had times of agitation and stress with the extended hospital stay but does acknowledge that he knows that he needs to be on the rehab unit now as he knows that he is not able to do things that are needed and his wife is not able to do things that are needed for his care.  The patient describes stress with not being able to be around his house, his family, or his dog.  The patient reports that pain has been better but he has had difficulty tolerating physical movement and reports that most of this has to do with fear of a bad outcome.   Behavioral Observation: Larry Fowler  presents as a 48 y.o.-year-old Right Caucasian Male who appeared  his stated age. his dress was Appropriate and he was Well Groomed and his manners were Appropriate to the situation.  his participation was indicative of Appropriate and Redirectable behaviors.  There were any physical disabilities noted.  he displayed an appropriate level of cooperation and  motivation.     Interactions:    Active Appropriate and Redirectable  Attention:   within normal limits and attention span and concentration were age appropriate  Memory:   within normal limits; recent and remote memory intact  Visuo-spatial:  not examined  Speech (Volume):  normal  Speech:   normal; normal  Thought Process:  Coherent and Relevant  Though Content:  Rumination; not suicidal and not homicidal  Orientation:   person, place, time/date and situation  Judgment:   Fair  Planning:   Fair  Affect:    Anxious, Depressed, Labile and Tearful  Mood:    Anxious and Dysphoric  Insight:   Fair  Intelligence:   normal  Medical History:   Past Medical History:  Diagnosis Date  . ALLERGIC RHINITIS 09/12/2007   Qualifier: Diagnosis of  By: Jonny Ruiz MD, Len Blalock   . ANXIETY 02/16/2007   Qualifier: Diagnosis of  By: Tyrone Apple, Lucy    . ASTHMA 12/17/2009   Qualifier: Diagnosis of  By: Jonny Ruiz MD, Len Blalock   . Cannabis abuse    currently  . Chills with fever   . DEPRESSION 09/12/2007   Qualifier: Diagnosis of  By: Jonny Ruiz MD, Len Blalock   . History of cocaine use    in his 30's  . Hyperglycemia   . Rectal fistula 2009  . Rectal pain   . Renal stone    2008    Psychiatric History:  The patient does have a history of anxiety and depressive type symptoms in the past.  These have been documented in his medical chart and the patient does report an exacerbation of depression anxiety symptoms.  He has been started on Zoloft during his hospital course.  The patient reports some hesitancy to taking the medication just to make him feel happy.  This medication was reviewed and mechanism of action was explained to the patient to help him better understand what is tried to be achieved with the SSRI medication.  Family Med/Psych History:  Family History  Problem Relation Age of Onset  . Diabetes type II Mother   . Pneumonia Father     Risk of Suicide/Violence: virtually non-existent the  patient denies any suicidal or homicidal ideation.  Impression/DX:  Larry Fowler is a 48 year old right-handed male with a history of obstructive sleep apnea with CPAP use.  The patient also history of tobacco abuse, anxiety and depression, asthma, super morbid obesity with a BMI of 64.  The patient presented on 07/20/2018 after slipping and falling backwards with acute onset of bilateral knee pain.  CT of the knees done revealed bilateral quad tendon rupture and underwent repair on 07/21/2018 by Dr. Roda Shutters.  Postoperative weightbearing was tolerated with knee braces locked in extension.  There have been issues of ongoing pain management and the patient has had times of significant agitation and reports significant symptoms of depression and anxiety.  The patient was at times today quite emotional with extended periods of crying.  The patient acknowledged that he has had times of agitation and stress with the extended hospital stay but does acknowledge that he knows that he needs to be on the rehab unit now as he knows that he is not able  to do things that are needed and his wife is not able to do things that are needed for his care.  The patient describes stress with not being able to be around his house, his family, or his dog.  The patient reports that pain has been better but he has had difficulty tolerating physical movement and reports that most of this has to do with fear of a bad outcome.  The patient does have a history of anxiety and depressive type symptoms in the past.  These have been documented in his medical chart and the patient does report an exacerbation of depression anxiety symptoms.  He has been started on Zoloft during his hospital course.  The patient reports some hesitancy to taking the medication just to make him feel happy.  This medication was reviewed and mechanism of action was explained to the patient to help him better understand what is tried to be achieved with the SSRI  medication.  Disposition/Plan:  Today we worked on coping and adjustment strategies around his issues with extended hospital stay and significant reduction in physical functioning.  I will see the patient again later this week.  Diagnosis:    Bilateral quad rupture         Electronically Signed   _______________________ Arley PhenixJohn Rodenbough, Psy.D.

## 2018-08-07 NOTE — Plan of Care (Signed)
  Problem: RH BOWEL ELIMINATION Goal: RH STG MANAGE BOWEL WITH ASSISTANCE Description STG Manage Bowel with Min Assistance.  Outcome: Progressing Flowsheets (Taken 08/07/2018 1325) STG: Pt will manage bowels with assistance: 1-Total assistance Goal: RH STG MANAGE BOWEL W/MEDICATION W/ASSISTANCE Description STG Manage Bowel with Medication with Min Assistance.  Outcome: Progressing Flowsheets (Taken 08/07/2018 1325) STG: Pt will manage bowels with medication with assistance: 1-Total assistance   Problem: RH BLADDER ELIMINATION Goal: RH STG MANAGE BLADDER WITH ASSISTANCE Description STG Manage Bladder With Min Assistance  Outcome: Progressing Flowsheets (Taken 08/07/2018 1325) STG: Pt will manage bladder with assistance: 2-Maximum assistance   Problem: RH SKIN INTEGRITY Goal: RH STG SKIN FREE OF INFECTION/BREAKDOWN Description Patients skin will be free from breakdown and inspected every shift.  Outcome: Progressing Goal: RH STG MAINTAIN SKIN INTEGRITY WITH ASSISTANCE Description STG Maintain Skin Integrity With Min Assistance.  Outcome: Progressing Flowsheets (Taken 08/07/2018 1325) STG: Maintain skin integrity with assistance: 1-Total assistance Goal: RH STG ABLE TO PERFORM INCISION/WOUND CARE W/ASSISTANCE Description STG Able To Perform Incision/Wound Care With Assistance. Outcome: Progressing Flowsheets (Taken 08/07/2018 1325) STG: Pt will be able to perform incision/wound care with assistance: 1-Total assistance   Problem: RH SAFETY Goal: RH STG ADHERE TO SAFETY PRECAUTIONS W/ASSISTANCE/DEVICE Description STG Adhere to Safety Precautions With Min Assistance/Device.  Outcome: Progressing Flowsheets (Taken 08/07/2018 1325) STG:Pt will adhere to safety precautions with assistance/device: 1-Total assistance   Problem: RH PAIN MANAGEMENT Goal: RH STG PAIN MANAGED AT OR BELOW PT'S PAIN GOAL Description Patient will be pain free or pain less than 3 during admission  Outcome:  Progressing   Problem: RH KNOWLEDGE DEFICIT GENERAL Goal: RH STG INCREASE KNOWLEDGE OF SELF CARE AFTER HOSPITALIZATION Outcome: Progressing

## 2018-08-07 NOTE — Telephone Encounter (Signed)
See message below °

## 2018-08-07 NOTE — Progress Notes (Signed)
[place] PHYSICAL MEDICINE & REHABILITATION PROGRESS NOTE  Subjective/Complaints: Patient seen lying in bed this morning.  He states he slept well overnight.  He states he had a fair weekend.  He notes itching on his back and requests Benadryl cream.  ROS: +Back pruritus.  Denies CP, shortness of breath, nausea, vomiting, diarrhea.  Objective: Vital Signs: Blood pressure 123/66, pulse 62, temperature 98 F (36.7 C), temperature source Oral, resp. rate 16, height 6\' 2"  (1.88 m), weight (!) 226.9 kg, SpO2 93 %. No results found. No results for input(s): WBC, HGB, HCT, PLT in the last 72 hours. No results for input(s): NA, K, CL, CO2, GLUCOSE, BUN, CREATININE, CALCIUM in the last 72 hours.  Physical Exam: BP 123/66 (BP Location: Right Arm)   Pulse 62   Temp 98 F (36.7 C) (Oral)   Resp 16   Ht 6\' 2"  (1.88 m)   Wt (!) 226.9 kg   SpO2 93%   BMI 64.22 kg/m  Constitutional: No distress . Vital signs reviewed.  Morbidly obese. HENT: Normocephalic.  Atraumatic. Eyes: EOMI. No discharge. Cardiovascular: RRR.  No JVD. Respiratory: CTA bilaterally.  Normal effort. GI: BS +. Non-distended. Musc: Bilateral lower edema and tenderness  Neurological: He is alert and oriented Motor: Bilateral upper extremities: 5/5 proximal to distal Right lower extremity: Hip flexion 3/5, Knee extension left, ankle dorsiflexion 5/5, unchanged Left lower extremity: Hip flexion 3+/5, knee extension locked in brace, ankle dorsiflexion 5/5 Skin: Bilateral Bledsoe braces in place with dressing C/D/I  Psychiatric: He has a normal mood and affect. His behavior is normal  Assessment/Plan: 1. 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  Physiatrist is providing close team supervision and 24 hour management of active medical problems listed below.  Physiatrist and rehab  team continue to assess barriers to discharge/monitor patient progress toward functional and medical goals  Care Tool:  Bathing    Body parts bathed by patient: Right arm, Left arm, Chest, Abdomen, Face   Body parts bathed by helper: Buttocks, Front perineal area, Left upper leg, Right upper leg Body parts n/a: Left upper leg, Right lower leg(Did not attempt washing feet this session)   Bathing assist Assist Level: Maximal Assistance - Patient 24 - 49%     Upper Body Dressing/Undressing Upper body dressing   What is the patient wearing?: Pull over shirt    Upper body assist Assist Level: Minimal Assistance - Patient > 75%    Lower Body Dressing/Undressing Lower body dressing      What is the patient wearing?: Pants     Lower body assist Assist for lower body dressing: Total Assistance - Patient < 25%     Toileting Toileting    Toileting assist Assist for toileting: 2 Helpers     Transfers Chair/bed transfer  Transfers assist  Chair/bed transfer activity did not occur: Safety/medical concerns  Chair/bed transfer assist level: 2 Helpers     Locomotion Ambulation   Ambulation assist   Ambulation activity did not occur: Safety/medical concerns          Walk 10 feet activity   Assist  Walk 10 feet activity did not occur: Safety/medical concerns        Walk 50 feet activity   Assist Walk 50 feet with 2 turns activity did not occur: Safety/medical concerns         Walk 150 feet activity  Assist Walk 150 feet activity did not occur: Safety/medical concerns         Walk 10 feet on uneven surface  activity   Assist Walk 10 feet on uneven surfaces activity did not occur: Safety/medical concerns         Wheelchair     Assist   Type of Wheelchair: Manual Wheelchair activity did not occur: Safety/medical concerns  Wheelchair assist level: Supervision/Verbal cueing Max wheelchair distance: 118ft    Wheelchair 50 feet with 2  turns activity    Assist    Wheelchair 50 feet with 2 turns activity did not occur: Safety/medical concerns   Assist Level: Supervision/Verbal cueing   Wheelchair 150 feet activity     Assist Wheelchair 150 feet activity did not occur: Safety/medical concerns          Medical Problem List and Plan: 1.  Decreased functional mobility secondary to bilateral quadricep tendon rupture. Status post repair of bilateral tendon rupture repair 07/21/2018. Weightbearing as tolerated. Bilateral Bledsoe brace locked in extension             Continue CIR  Weekend notes reviewed- some pain withdrawal issues 2.  Antithrombotics: -DVT/anticoagulation:  Subcutaneous Lovenox 110 mg daily.   Lower extremity vascular ultrasound limited, but negative             -antiplatelet therapy: Not applicable 3. Pain Management:  Lidoderm patch,OxyContin sustained release 10 mg BID Wean oxycontin to qhs , oxycodone immediate release for breakthrough pain 4. Mood:  Zoloft 25 mg daily. Provide emotional support  Appreciate Neuropsych consult- discussed with provider             -antipsychotic agents:  Not applicable 5. Neuropsych: This patient is capable of making decisions on his own behalf. 6. Skin/Wound Care: Stage I pressure ulcer. Follow-up wound care nurse Routine skin checks 7. Fluids/Electrolytes/Nutrition:  Routine interval mouth with follow-up  BMP within acceptable range on 2/26 8. Acute blood loss anemia. Transfused 1 unit packed red blood cells postoperatively.   Hemoglobin 8.4 on 2/26  Continue to monitor 9.  Super super obesity. Follow-up dietary services 10. New findingsType 2 diabetes mellitus. Hemoglobin A1c 7.1. Glucophage 500 mg daily Check blood sugars before meals and at bedtime. Diabetic teaching    Monitor with increased mobility CBG (last 3)  Recent Labs    08/06/18 1656 08/06/18 2110 08/07/18 0619  GLUCAP 132* 98 127*   Labile on 3/2 11. Acute otitis media. Augmentin  875-125 mg initiated 07/30/2018. 12. Constipation. Laxative assistance 13. Asthma/OSA/tobacco/ marijuana abuse. CPAP as directed.Continue Symbicort as well as nebulizer treatments. Provide counseling regards to tobacco abuse 14.  Hypoalbuminemia  Supplement initiated on 2/26 15.  Labile blood pressure   Vitals:   08/06/18 1925 08/07/18 0411  BP: 133/69 123/66  Pulse: 60 62  Resp: 16 16  Temp: 98.4 F (36.9 C) 98 F (36.7 C)  SpO2: 98% 93%   Controlled on 3/2 16.  Sleep disturbance  Restoril started on 2/27  Improved 17. Sore throat  Chloraseptic spray ordered 18.  Pruritus  Benadryl cream ordered on 3/2  LOS: 6 days A FACE TO FACE EVALUATION WAS PERFORMED  Ankit Karis Juba 08/07/2018, 9:30 AM

## 2018-08-08 ENCOUNTER — Inpatient Hospital Stay (HOSPITAL_COMMUNITY): Payer: Self-pay

## 2018-08-08 ENCOUNTER — Inpatient Hospital Stay (HOSPITAL_COMMUNITY): Payer: Self-pay | Admitting: *Deleted

## 2018-08-08 DIAGNOSIS — S76112S Strain of left quadriceps muscle, fascia and tendon, sequela: Secondary | ICD-10-CM

## 2018-08-08 LAB — CREATININE, SERUM
CREATININE: 0.65 mg/dL (ref 0.61–1.24)
GFR calc Af Amer: >60 mL/min (ref >60)
GFR calc non Af Amer: >60 mL/min (ref >60)

## 2018-08-08 LAB — GLUCOSE, CAPILLARY
GLUCOSE-CAPILLARY: 126 mg/dL — AB (ref 70–99)
Glucose-Capillary: 123 mg/dL — ABNORMAL HIGH (ref 70–99)
Glucose-Capillary: 124 mg/dL — ABNORMAL HIGH (ref 70–99)
Glucose-Capillary: 139 mg/dL — ABNORMAL HIGH (ref 70–99)

## 2018-08-08 NOTE — Progress Notes (Addendum)
Occupational Therapy Session Note  Patient Details  Name: Larry Fowler MRN: [id number] Date of Birth: 05/27/1971  Today's Date: 08/08/2018 OT Individual Time: 0845-0958 Session 2: 1300-1400 OT Individual Time Calculation (min): 73 min Session 2: 60 min    Short Term Goals: Week 1:  OT Short Term Goal 1 (Week 1): Pt will tolerate sitting EOB for UB selfcare with supervision for 15 mins.   OT Short Term Goal 2 (Week 1): Pt will perform LB bathing supine to sit with mod assist.  OT Short Term Goal 3 (Week 1): Pt will tolerate tilted bed up to 75 degrees for at least 2 mins in preparation for  OT Short Term Goal 4 (Week 1): Pt will use sockaide to donn gripper socks in sitting with setup only.   OT Short Term Goal 5 (Week 1): Pt will perform stand pivot transfer to bariatric 3:1 with total assist +2 (pt 30%).  Skilled Therapeutic Interventions/Progress Updates:    Session focused on bed level self care task. Pt c/o pain as described below. Pt completed rolling R and L with CGA, blocking on side of bed for safety. Pt was instructed in use of LH sponge to reach distally while bathing bed level. Pt resistant to use and complaining throughout, however was able to reach legs and anterior peri area with good thoroughness. Pt still required assist to change socks and to thoroughly clean peri areas. Max A +2 to don shorts at bed level. Pt able to don gown with set up. Increased time required for rest breaks. Pt left supine with all needs met.   Session 2: Session focused on bed mobility and B UE strengthening/endurance. Pt rolled R and L in order to don shorts with max A, no assist for rolling. Sling was positioned for maxi sky lift. Pt was hoyered to w/c, self directing care. Pt was transported to therapy gym for time management where he held 7 lb dumbbells and completed B UE strengthening/endurance circuit. Intermittent cueing for technique/muscle activation and constant demo provided. Pt returned to  room where he was hoyered to bariatic recliner. Edu provided throughout session re return to activity and building endurance back up. Pt was set up with lunch and left with all needs within reach.   Therapy Documentation Precautions:  Precautions Precautions: Fall Precaution Comments: NO knee flexion Required Braces or Orthoses: Other Brace Knee Immobilizer - Right: On at all times Knee Immobilizer - Left: On at all times Other Brace: Bilateral Bledsoe braces locked in extension Restrictions Weight Bearing Restrictions: No RLE Weight Bearing: Weight bearing as tolerated LLE Weight Bearing: Weight bearing as tolerated Pain: Pain Assessment Pain Scale: 0-10 Pain Score: 6  Pain Type: Acute pain Pain Location: Back Pain Orientation: Lower Pain Descriptors / Indicators: Aching Pain Frequency: Intermittent Pain Onset: On-going Pain Intervention(s): Repositioned   Therapy/Group: Individual Therapy   H  08/08/2018, 12:23 PM  

## 2018-08-08 NOTE — Progress Notes (Signed)
Patient has home CPAP unit.  No assistance needed from RT at this time.  Patient understands to call for assistance if needed.

## 2018-08-08 NOTE — Plan of Care (Signed)
  Problem: RH BOWEL ELIMINATION Goal: RH STG MANAGE BOWEL WITH ASSISTANCE Description STG Manage Bowel with Min Assistance.  08/08/2018 1541 by Marylene Land, RN Outcome: Progressing 08/08/2018 1540 by Marylene Land, RN Outcome: Progressing Flowsheets (Taken 08/08/2018 1540) STG: Pt will manage bowels with assistance: 1-Total assistance Goal: RH STG MANAGE BOWEL W/MEDICATION W/ASSISTANCE Description STG Manage Bowel with Medication with Min Assistance.  Outcome: Progressing Flowsheets (Taken 08/08/2018 1541) STG: Pt will manage bowels with medication with assistance: 1-Total assistance   Problem: RH BLADDER ELIMINATION Goal: RH STG MANAGE BLADDER WITH ASSISTANCE Description STG Manage Bladder With Min Assistance  Outcome: Progressing Flowsheets (Taken 08/08/2018 1541) STG: Pt will manage bladder with assistance: 1-Total assistance   Problem: RH SKIN INTEGRITY Goal: RH STG SKIN FREE OF INFECTION/BREAKDOWN Description Patients skin will be free from breakdown and inspected every shift.  Outcome: Progressing Goal: RH STG MAINTAIN SKIN INTEGRITY WITH ASSISTANCE Description STG Maintain Skin Integrity With Min Assistance.  Outcome: Progressing Goal: RH STG ABLE TO PERFORM INCISION/WOUND CARE W/ASSISTANCE Description STG Able To Perform Incision/Wound Care With Assistance. Outcome: Progressing Flowsheets (Taken 08/08/2018 1541) STG: Pt will be able to perform incision/wound care with assistance: 1-Total assistance   Problem: RH SAFETY Goal: RH STG ADHERE TO SAFETY PRECAUTIONS W/ASSISTANCE/DEVICE Description STG Adhere to Safety Precautions With Min Assistance/Device.  Outcome: Progressing Flowsheets (Taken 08/08/2018 1541) STG:Pt will adhere to safety precautions with assistance/device: 1-Total assistance   Problem: RH PAIN MANAGEMENT Goal: RH STG PAIN MANAGED AT OR BELOW PT'S PAIN GOAL Description Patient will be pain free or pain less than 3 during admission  Outcome:  Progressing   Problem: RH KNOWLEDGE DEFICIT GENERAL Goal: RH STG INCREASE KNOWLEDGE OF SELF CARE AFTER HOSPITALIZATION Outcome: Progressing

## 2018-08-08 NOTE — Telephone Encounter (Signed)
Just saw him and removed all stitches

## 2018-08-08 NOTE — Progress Notes (Signed)
[facility] PHYSICAL MEDICINE & REHABILITATION PROGRESS NOTE  Subjective/Complaints: Patient seen laying in bed this morning.  He states he slept well overnight.  He states he has some back pain from his previous bed.  He is question about his creatinine.  He was seen by Ortho NP this morning, notes reviewed.  ROS: Denies CP, shortness of breath, nausea, vomiting, diarrhea.  Objective: Vital Signs: Blood pressure 131/71, pulse 64, temperature 98.9 F (37.2 C), resp. rate 16, height 6\' 2"  (1.88 m), weight (!) 226.9 kg, SpO2 98 %. No results found. No results for input(s): WBC, HGB, HCT, PLT in the last 72 hours. Recent Labs    08/08/18 0556  CREATININE 0.65    Physical Exam: BP 131/71 (BP Location: Right Arm)   Pulse 64   Temp 98.9 F (37.2 C)   Resp 16   Ht 6\' 2"  (1.88 m)   Wt (!) 226.9 kg   SpO2 98%   BMI 64.22 kg/m  Constitutional: No distress . Vital signs reviewed.  Morbidly obese. HENT: Normocephalic.  Atraumatic. Eyes: EOMI. No discharge. Cardiovascular: RRR.  No JVD. Respiratory: CTA bilaterally.  Normal effort. GI: BS +. Non-distended. Musc: Bilateral lower edema and tenderness  Neurological: He is alert and oriented Motor: Bilateral upper extremities: 5/5 proximal to distal Right lower extremity: Hip flexion 3-/5, Knee extension left, ankle dorsiflexion 5/5 Left lower extremity: Hip flexion 3+/5, knee extension locked in brace, ankle dorsiflexion 5/5, stable Skin: Bilateral Bledsoe braces in place with dressing C/D/I  Psychiatric: He has a normal mood and affect. His behavior is normal  Assessment/Plan: 1. 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  Physiatrist is providing close team supervision and 24 hour management of active medical problems listed below.  Physiatrist and rehab team continue to assess barriers to  discharge/monitor patient progress toward functional and medical goals  Care Tool:  Bathing    Body parts bathed by patient: Right arm, Left arm, Chest, Abdomen, Face   Body parts bathed by helper: Buttocks, Front perineal area, Left upper leg, Right upper leg, Left lower leg, Right lower leg Body parts n/a: Left upper leg, Right lower leg(Did not attempt washing feet this session)   Bathing assist Assist Level: Maximal Assistance - Patient 24 - 49%     Upper Body Dressing/Undressing Upper body dressing   What is the patient wearing?: Pull over shirt    Upper body assist Assist Level: Minimal Assistance - Patient > 75%    Lower Body Dressing/Undressing Lower body dressing      What is the patient wearing?: Pants     Lower body assist Assist for lower body dressing: Total Assistance - Patient < 25%     Toileting Toileting    Toileting assist Assist for toileting: 2 Helpers     Transfers Chair/bed transfer  Transfers assist  Chair/bed transfer activity did not occur: Safety/medical concerns  Chair/bed transfer assist level: 2 Helpers     Locomotion Ambulation   Ambulation assist   Ambulation activity did not occur: Safety/medical concerns          Walk 10 feet activity   Assist  Walk 10 feet activity did not occur: Safety/medical concerns        Walk 50 feet activity   Assist Walk 50 feet with 2 turns activity did not occur: Safety/medical concerns  Walk 150 feet activity   Assist Walk 150 feet activity did not occur: Safety/medical concerns         Walk 10 feet on uneven surface  activity   Assist Walk 10 feet on uneven surfaces activity did not occur: Safety/medical concerns         Wheelchair     Assist   Type of Wheelchair: Manual Wheelchair activity did not occur: Safety/medical concerns  Wheelchair assist level: Supervision/Verbal cueing Max wheelchair distance: 141ft    Wheelchair 50 feet with 2  turns activity    Assist    Wheelchair 50 feet with 2 turns activity did not occur: Safety/medical concerns   Assist Level: Supervision/Verbal cueing   Wheelchair 150 feet activity     Assist Wheelchair 150 feet activity did not occur: Safety/medical concerns          Medical Problem List and Plan: 1.  Decreased functional mobility secondary to bilateral quadricep tendon rupture. Status post repair of bilateral tendon rupture repair 07/21/2018. Weightbearing as tolerated. Bilateral Bledsoe brace locked in extension             Continue CIR 2.  Antithrombotics: -DVT/anticoagulation:  Subcutaneous Lovenox 110 mg daily.   Lower extremity vascular ultrasound limited, but negative             -antiplatelet therapy: Not applicable 3. Pain Management:  Lidoderm patch,OxyContin sustained release 10 mg BID Wean oxycontin to qhs , oxycodone immediate release for breakthrough pain 4. Mood:  Zoloft 25 mg daily. Provide emotional support  Appreciate Neuropsych consult- discussed with provider             -antipsychotic agents:  Not applicable 5. Neuropsych: This patient is capable of making decisions on his own behalf. 6. Skin/Wound Care: Stage I pressure ulcer. Follow-up wound care nurse Routine skin checks 7. Fluids/Electrolytes/Nutrition:  Routine interval mouth with follow-up  BMP within acceptable range on 2/26, creatinine within normal limits on 3/3 8. Acute blood loss anemia. Transfused 1 unit packed red blood cells postoperatively.   Hemoglobin 8.4 on 2/26  Continue to monitor 9.  Super super obesity. Follow-up dietary services 10. New findingsType 2 diabetes mellitus. Hemoglobin A1c 7.1. Glucophage 500 mg daily Check blood sugars before meals and at bedtime. Diabetic teaching  Monitor with increased mobility CBG (last 3)  Recent Labs    08/07/18 1747 08/07/18 2130 08/08/18 0641  GLUCAP 106* 135* 123*   Relatively controlled on 3/3 11. Acute otitis media. Augmentin  875-125 mg initiated 07/30/2018. 12. Constipation. Laxative assistance 13. Asthma/OSA/tobacco/ marijuana abuse. CPAP as directed.Continue Symbicort as well as nebulizer treatments. Provide counseling regards to tobacco abuse 14.  Hypoalbuminemia  Supplement initiated on 2/26 15.  Labile blood pressure   Vitals:   08/07/18 2037 08/08/18 0409  BP: 138/62 131/71  Pulse: 66 64  Resp: 19 16  Temp: 99 F (37.2 C) 98.9 F (37.2 C)  SpO2: 99% 98%   Controlled on 3/3 16.  Sleep disturbance  Restoril started on 2/27  Improved 17. Sore throat  Chloraseptic spray ordered 18.  Pruritus  Benadryl cream ordered on 3/2  Improved  LOS: 7 days A FACE TO FACE EVALUATION WAS PERFORMED  Cathryne Mancebo Karis Juba 08/08/2018, 8:27 AM

## 2018-08-08 NOTE — Progress Notes (Signed)
Physical Therapy Session Note  Patient Details  Name: Larry Fowler MRN: [id number] Date of Birth: 04-20-1971  Today's Date: 08/08/2018 PT Individual Time: 1105-1205 PT Individual Time Calculation (min): 60 min   Short Term Goals: Week 1:  PT Short Term Goal 1 (Week 1): Pt will perform sit<>supine with mod assist  PT Short Term Goal 2 (Week 1): Pt will remain standing x 5 minutes on TIS bed.  PT Short Term Goal 3 (Week 1): Pt will perform sit<>stand from elevated surface with max + 2 assist    Skilled Therapeutic Interventions/Progress Updates: Pt presented in bed agreeable to therapy. Pt c/o of pain in low back L side. Stated started last night, pain patch noted at spot. Noted to be TTP and was able to tolerate STM to area with some relief. Discussed use of heat/ice after therapy for pain management with pt in agreement. Pt agreeable to attempt standing in VItaGo be. Pt request to try ace bandages on BLE so that brace is not on bare skin. Pt able to partially lift LLE with tech assisting and holding LE while PTA donned ace bandages. PTA readjusted and tightened brace. Pt was able to perform same on RLE. PTA also donned KI for reinforcement per pt request.  Pt then strapped into VitaGo bed and was tilted to maximum upright position at pt's self selected tolerance (max 82 degrees). Pt was able to tolerate max tilt x 5 min with PTA slightly loosening lowest strap (below knees) with fair tolerance from pt. Pt returned to supine and indicating urgency for BM. Pt able to roll with CGA and increased time to allow placement of sling. Pt transferred via [facility] to commode (+BM). Pt then handed off to NT's due to malfunctioning (uncharged) equipment.      Therapy Documentation Precautions:  Precautions Precautions: Fall Precaution Comments: NO knee flexion Required Braces or Orthoses: Other Brace Knee Immobilizer - Right: On at all times Knee Immobilizer - Left: On at all times Other Brace:  Bilateral Bledsoe braces locked in extension Restrictions Weight Bearing Restrictions: No RLE Weight Bearing: Weight bearing as tolerated LLE Weight Bearing: Weight bearing as tolerated General:   Vital Signs: Therapy Vitals Temp: 98.8 F (37.1 C) Pulse Rate: 74 Resp: 20 BP: 130/79 Patient Position (if appropriate): Sitting Oxygen Therapy SpO2: 100 % O2 Device: Room Air Pain: Pain Assessment Pain Scale: 0-10 Pain Score: 6  Pain Type: Acute pain Pain Location: Back Pain Orientation: Lower Pain Descriptors / Indicators: Aching Pain Onset: On-going Pain Intervention(s): Repositioned    Therapy/Group: Individual Therapy  Sang Blount  Mendy Lapinsky, PTA  08/08/2018, 3:48 PM

## 2018-08-08 NOTE — Progress Notes (Signed)
Subjective:    Patient reports pain as moderate.  Patient currently in CIR.  Making very slow progress.  Compliant with hinged knee brace locked 0-30  Objective: Vital signs in last 24 hours: Temp:  [98.5 F (36.9 C)-99 F (37.2 C)] 98.9 F (37.2 C) (03/03 0409) Pulse Rate:  [60-66] 64 (03/03 0409) Resp:  [16-20] 16 (03/03 0409) BP: (131-144)/(62-71) 131/71 (03/03 0409) SpO2:  [98 %-100 %] 98 % (03/03 0409)  Intake/Output from previous day: 03/02 0701 - 03/03 0700 In: 790 [P.O.:790] Out: -  Intake/Output this shift: No intake/output data recorded.  No results for input(s): HGB in the last 72 hours. No results for input(s): WBC, RBC, HCT, PLT in the last 72 hours. Recent Labs    08/08/18 0556  CREATININE 0.65   No results for input(s): LABPT, INR in the last 72 hours.  Neurologically intact Neurovascular intact Sensation intact distally Intact pulses distally Dorsiflexion/Plantar flexion intact Incision: dressing C/D/I No cellulitis present Compartment soft  Hinged knee brace in place BLE   Assessment/Plan:    Up with therapy  WBAT BLE- hinged knee brace must be on at all times locked 0-30 Stitches removed today F/u with Dr. Roda Shutters in 4 weeks      Cristie Hem 08/08/2018, 7:53 AM

## 2018-08-09 ENCOUNTER — Inpatient Hospital Stay (HOSPITAL_COMMUNITY): Payer: Self-pay

## 2018-08-09 ENCOUNTER — Inpatient Hospital Stay (HOSPITAL_COMMUNITY): Payer: Self-pay | Admitting: Physical Therapy

## 2018-08-09 ENCOUNTER — Encounter (HOSPITAL_COMMUNITY): Payer: Self-pay | Admitting: Psychology

## 2018-08-09 LAB — GLUCOSE, CAPILLARY
Glucose-Capillary: 146 mg/dL — ABNORMAL HIGH (ref 70–99)
Glucose-Capillary: 149 mg/dL — ABNORMAL HIGH (ref 70–99)
Glucose-Capillary: 118 mg/dL — ABNORMAL HIGH (ref 70–99)
Glucose-Capillary: 159 mg/dL — ABNORMAL HIGH (ref 70–99)

## 2018-08-09 NOTE — Progress Notes (Signed)
Physical Therapy Session Note  Patient Details  Name: Larry Fowler MRN: [id number] Date of Birth: [birth year]  Today's Date: 08/09/2018 PT Individual Time: 0600-4599 PT Individual Time Calculation (min): 53 min   Short Term Goals: Week 1:  PT Short Term Goal 1 (Week 1): Pt will perform sit<>supine with mod assist  PT Short Term Goal 2 (Week 1): Pt will remain standing x 5 minutes on TIS bed.  PT Short Term Goal 3 (Week 1): Pt will perform sit<>stand from elevated surface with max + 2 assist    Skilled Therapeutic Interventions/Progress Updates:   Pt received supine in bed and agreeable to PT. Pt reports need to BM. Rolling R and L for bed pan placement and CGA for LE management. PT adjusted BLE bledsoe braces as well as re-applied KI to improve knee stability. Supine>stand in aerolift bed. 80deg x 2 min x2. No pain or instability reported in pt knees, but states pain in L ankle due to positioning of the braces. Rolling R and L to remove Maxi sky sling as well as reposition chuck pad. Pt left supine in bed with all needs met.       Therapy Documentation Precautions:  Precautions Precautions: Fall Precaution Comments: NO knee flexion Required Braces or Orthoses: Other Brace Knee Immobilizer - Right: On at all times Knee Immobilizer - Left: On at all times Other Brace: Bilateral Bledsoe braces locked in extension Restrictions Weight Bearing Restrictions: No RLE Weight Bearing: Weight bearing as tolerated LLE Weight Bearing: Weight bearing as tolerated Vital Signs: Therapy Vitals Temp: 98.5 F (36.9 C) Temp Source: Oral Pulse Rate: 67 Resp: 18 BP: (!) 116/50 Patient Position (if appropriate): Lying Oxygen Therapy SpO2: 98 % O2 Device: Room Air Pain: Pain Assessment Pain Scale: 0-10 Pain Score: 5  Pain Type: Acute pain Pain Location: Knee Pain Descriptors / Indicators: Aching Pain Frequency: Occasional Pain Intervention(s): Medication (See  eMAR)    Therapy/Group: Individual Therapy  Lorie Phenix 08/09/2018, 11:48 PM

## 2018-08-09 NOTE — Progress Notes (Addendum)
Nutrition Follow-up  DOCUMENTATION CODES:   Morbid obesity  INTERVENTION:  - Continue snacks daily - Continue double protein portions with meals - Continue MVI daily - Pro-stat has been D/c  NUTRITION DIAGNOSIS:   Increased nutrient needs related to wound healing as evidenced by estimated needs.  Ongoing  GOAL:   Patient will meet greater than or equal to 90% of their needs  Meeting with supplements and food  MONITOR:   PO intake, Supplement acceptance, Skin, Labs  ASSESSMENT:   48 year old male with PMH of OSA-CPAP, tobacco abuse, anxiety/depression, asthma, and morbid obesity. Pt resented 07/20/18 after slipping and falling backwards with acute onset of bilateral knee pain. CT of the knees done revealing bilateral quad tendon rupture. Pt underwent repair on 07/21/18. Findings of elevated hemoglobin A1C of 7.1. Pt admitted to CIR on 2/25.  Spoke with pt at bedside.  Pt reports that he has not taken the Pro-stat in a long time and will continue to refuse it if re-ordered. Per chart PS was d/c on 3/4. Pt does not want any kind of supplements. He does want to continue to have snacks and double protein portions.  Meal completion is documented 50-100%, when asked about this pt reported that he will only eat certain things sent to him if it is a tray that he did not order personally. He mentioned that when he orders his meals he eats 100%. His appetite is still good.   Encouraged pt to continue to order high protein food items to assist with healing. Unable to obtain new wt. Will continue to monitor PO intake and add another yogurt for a snack.  Medications reviewed and include: Insulin aspart 0-9 units, MVI, Protonix, Senokot 3tablet 2x Labs reviewed: CBG ([phone])    Diet Order:   Diet Order            Diet Carb Modified Fluid consistency: Thin; Room service appropriate? Yes  Diet effective now              EDUCATION NEEDS:   Education needs have been  addressed  Skin:  Skin Assessment: Skin Integrity Issues: Skin Integrity Issues:: Stage II, Incisions Stage II: Right & left buttocks, Left thigh Incisions: L knee, R knee   Last BM:  3/4, had 2 today  Height:   Ht Readings from Last 1 Encounters:  08/01/18 6\' 2"  (1.88 m)    Weight:   Wt Readings from Last 1 Encounters:  08/02/18 (!) 226.9 kg    Ideal Body Weight:  86.4 kg  BMI:  Body mass index is 64.22 kg/m.  Estimated Nutritional Needs:   Kcal:  2600-2800  Protein:  120-135 grams  Fluid:  >/= 2.4 L    NIKE

## 2018-08-09 NOTE — Consult Note (Signed)
WOC Nurse wound follow-up consult note Pt previously was noted to have a deep tissue pressure injury; refer to consult note on 2/26.  This has evolved into a stage 2 pressure injury; .3X.3X.1cm pink and dry to right inner buttock.  Foam dressing in place to protect and promote healing.  Please re-consult if further assistance is needed.  Thank-you,  Cammie Mcgee MSN, RN, CWOCN, [place], CNS [phone]

## 2018-08-09 NOTE — Progress Notes (Signed)
[facility] PHYSICAL MEDICINE & REHABILITATION PROGRESS NOTE  Subjective/Complaints: Patient seen laying in bed this morning.  He states he slept well overnight.  He notes improvement in strength.  He states he wants his Prosource discontinued because he cannot stand the taste.  Educated patient on protein supplementation.  ROS: Denies CP, shortness of breath, nausea, vomiting, diarrhea.  Objective: Vital Signs: Blood pressure (!) 120/51, pulse 71, temperature 99.7 F (37.6 C), temperature source Oral, resp. rate 17, height 6\' 2"  (1.88 m), weight (!) 226.9 kg, SpO2 96 %. No results found. No results for input(s): WBC, HGB, HCT, PLT in the last 72 hours. Recent Labs    08/08/18 0556  CREATININE 0.65    Physical Exam: BP (!) 120/51 (BP Location: Right Arm)   Pulse 71   Temp 99.7 F (37.6 C) (Oral)   Resp 17   Ht 6\' 2"  (1.88 m)   Wt (!) 226.9 kg   SpO2 96%   BMI 64.22 kg/m  Constitutional: No distress . Vital signs reviewed.  Morbidly obese. HENT: Normocephalic.  Atraumatic. Eyes: EOMI. No discharge. Cardiovascular: RRR.  No JVD. Respiratory: CTA bilaterally.  Normal effort. GI: BS +. Non-distended. Musc: Bilateral lower edema and tenderness  Neurological: He is alert and oriented Motor: Bilateral upper extremities: 5/5 proximal to distal Right lower extremity: Hip flexion 3/5, Knee extension locked in brace, ankle dorsiflexion 5/5 Left lower extremity: Hip flexion 3+/5, knee extension locked in brace, ankle dorsiflexion 5/5, stable Skin: Bilateral Bledsoe braces in place with incisions C/D/I  Psychiatric: He has a normal mood and affect. His behavior is normal  Assessment/Plan: 1. 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  Physiatrist is providing close team supervision and 24 hour management of active medical problems listed  below.  Physiatrist and rehab team continue to assess barriers to discharge/monitor patient progress toward functional and medical goals  Care Tool:  Bathing    Body parts bathed by patient: Right arm, Left arm, Chest, Abdomen, Face, Front perineal area, Right upper leg, Left upper leg, Right lower leg, Left lower leg   Body parts bathed by helper: Buttocks Body parts n/a: Left upper leg, Right lower leg(Did not attempt washing feet this session)   Bathing assist Assist Level: Moderate Assistance - Patient 50 - 74%     Upper Body Dressing/Undressing Upper body dressing   What is the patient wearing?: Hospital gown only    Upper body assist Assist Level: Contact Guard/Touching assist    Lower Body Dressing/Undressing Lower body dressing      What is the patient wearing?: Pants     Lower body assist Assist for lower body dressing: Maximal Assistance - Patient 25 - 49%     Toileting Toileting    Toileting assist Assist for toileting: 2 Helpers     Transfers Chair/bed transfer  Transfers assist  Chair/bed transfer activity did not occur: Safety/medical concerns  Chair/bed transfer assist level: 2 Helpers     Locomotion Ambulation   Ambulation assist   Ambulation activity did not occur: Safety/medical concerns          Walk 10 feet activity   Assist  Walk 10 feet activity did not occur: Safety/medical concerns        Walk 50 feet activity   Assist Walk 50 feet with 2 turns activity did not occur: Safety/medical concerns  Walk 150 feet activity   Assist Walk 150 feet activity did not occur: Safety/medical concerns         Walk 10 feet on uneven surface  activity   Assist Walk 10 feet on uneven surfaces activity did not occur: Safety/medical concerns         Wheelchair     Assist   Type of Wheelchair: Manual Wheelchair activity did not occur: Safety/medical concerns  Wheelchair assist level: Supervision/Verbal  cueing Max wheelchair distance: 117ft    Wheelchair 50 feet with 2 turns activity    Assist    Wheelchair 50 feet with 2 turns activity did not occur: Safety/medical concerns   Assist Level: Supervision/Verbal cueing   Wheelchair 150 feet activity     Assist Wheelchair 150 feet activity did not occur: Safety/medical concerns          Medical Problem List and Plan: 1.  Decreased functional mobility secondary to bilateral quadricep tendon rupture. Status post repair of bilateral tendon rupture repair 07/21/2018. Weightbearing as tolerated. Bilateral Bledsoe brace locked in extension             Continue CIR  Team conference today to discuss current and goals and coordination of care, home and environmental barriers, and discharge planning with nursing, case manager, and therapies.  2.  Antithrombotics: -DVT/anticoagulation:  Subcutaneous Lovenox 110 mg daily.   Lower extremity vascular ultrasound limited, but negative             -antiplatelet therapy: Not applicable 3. Pain Management:  Lidoderm patch,OxyContin sustained release 10 mg BID Wean oxycontin to qhs , oxycodone immediate release for breakthrough pain 4. Mood:  Zoloft 25 mg daily. Provide emotional support  Appreciate Neuropsych consult- discussed with provider previously             -antipsychotic agents:  Not applicable 5. Neuropsych: This patient is capable of making decisions on his own behalf. 6. Skin/Wound Care: Stage I pressure ulcer. Follow-up wound care nurse Routine skin checks 7. Fluids/Electrolytes/Nutrition:  Routine interval mouth with follow-up  BMP within acceptable range on 2/26, creatinine within normal limits on 3/3 8. Acute blood loss anemia. Transfused 1 unit packed red blood cells postoperatively.   Hemoglobin 8.4 on 2/26   Will order labs for the end of the week  Continue to monitor 9.  Super super obesity. Follow-up dietary services 10. New findingsType 2 diabetes mellitus. Hemoglobin  A1c 7.1. Glucophage 500 mg daily Check blood sugars before meals and at bedtime. Diabetic teaching  Monitor with increased mobility CBG (last 3)  Recent Labs    08/08/18 1710 08/08/18 2131 08/09/18 0624  GLUCAP 124* 139* 146*   Relatively controlled on 3/4 11. Acute otitis media. Augmentin 875-125 mg initiated 07/30/2018. 12. Constipation. Laxative assistance 13. Asthma/OSA/tobacco/ marijuana abuse. CPAP as directed.Continue Symbicort as well as nebulizer treatments. Provide counseling regards to tobacco abuse 14.  Hypoalbuminemia  Supplement initiated on 2/26, DC'd on 3/4 per patient 15.  Labile blood pressure   Vitals:   08/08/18 2004 08/09/18 0627  BP: (!) 151/55 (!) 120/51  Pulse: 64 71  Resp: 18 17  Temp: 99.5 F (37.5 C) 99.7 F (37.6 C)  SpO2: 99% 96%   Labile on 3/4  16.  Sleep disturbance  Restoril started on 2/27  Improved 17. Sore throat  Chloraseptic spray ordered 18.  Pruritus  Benadryl cream ordered on 3/2  Improved  LOS: 8 days A FACE TO FACE EVALUATION WAS PERFORMED  Markan Cazarez Karis Juba 08/09/2018, 8:44  AM

## 2018-08-09 NOTE — Progress Notes (Signed)
Physical Therapy Session Note  Patient Details  Name: Larry Fowler MRN: [id number] Date of Birth: 09/02/1970  Today's Date: 08/09/2018 PT Individual Time: 1020-1115 PT Individual Time Calculation (min): 55 min   Short Term Goals: Week 1:  PT Short Term Goal 1 (Week 1): Pt will perform sit<>supine with mod assist  PT Short Term Goal 2 (Week 1): Pt will remain standing x 5 minutes on TIS bed.  PT Short Term Goal 3 (Week 1): Pt will perform sit<>stand from elevated surface with max + 2 assist    Skilled Therapeutic Interventions/Progress Updates: Pt presented in bed sleeping but easily aroused and agreeable to therapy. Pt required total A for urinal placement prior to activities (+void). PTA applied KI over DonJoy brace per pt request. Pt then performed rolling L/R with use of bedrails to set up MaxiSky brace. Pt then transferred to tile table to increase standing tolerance in more upright position. Pt was able achieve full 90 degrees however pt stating that due to body habitus felt that he was more leaning forward than standing straight. Per pt felt more "upright" at 80-85 degrees. Pt was able to tolerate this position for 5 minutes first bout and approx 3 min second bout. Pt stating no significant increase of pain in knees and was able to perform small range ankle pumps. PTA was able to loosen lowest strap on tilt table with fair tolerance from pt. Pt then indicating urgency for BM. Pt transferred to [facility] via MaxiSky and left with call bell within reach and nsg staff aware.      Therapy Documentation Precautions:  Precautions Precautions: Fall Precaution Comments: NO knee flexion Required Braces or Orthoses: Other Brace Knee Immobilizer - Right: On at all times Knee Immobilizer - Left: On at all times Other Brace: Bilateral Bledsoe braces locked in extension Restrictions Weight Bearing Restrictions: No RLE Weight Bearing: Weight bearing as tolerated LLE Weight Bearing: Weight bearing  as tolerated General:   Vital Signs:     Therapy/Group: Individual Therapy  Kamaya Keckler  Larry Fowler, PTA  08/09/2018, 2:33 PM

## 2018-08-09 NOTE — Progress Notes (Signed)
Occupational Therapy Session Note  Patient Details  Name: Larry Fowler MRN: [id number] Date of Birth: 27-Aug-1970  Today's Date: 08/09/2018 OT Individual Time: 2130-8657 OT Individual Time Calculation (min): 71 min    Short Term Goals: Week 1:  OT Short Term Goal 1 (Week 1): Pt will tolerate sitting EOB for UB selfcare with supervision for 15 mins.   OT Short Term Goal 2 (Week 1): Pt will perform LB bathing supine to sit with mod assist.  OT Short Term Goal 3 (Week 1): Pt will tolerate tilted bed up to 75 degrees for at least 2 mins in preparation for  OT Short Term Goal 4 (Week 1): Pt will use sockaide to donn gripper socks in sitting with setup only.   OT Short Term Goal 5 (Week 1): Pt will perform stand pivot transfer to bariatric 3:1 with total assist +2 (pt 30%).  Skilled Therapeutic Interventions/Progress Updates:    Session focused on bed mobility and self care tasks. . Pt completed rolling R and L with (S) only. Pt completed UB bathing with set up. Using LH sponge pt able to complete LB bathing with mod A, reaching anterior peri areas and legs, requiring assist for posterior peri areas. Total A required to adjust B bledsoe braces several times during session. Pt was able to use headboard to pull himself up several times in session with (S). Extended conversation re weight loss and return to wellness initiated by pt. Motivational interviewing techniques utilized to guide pt to self directed goals and an action plan. Pt receptive and thankful for conversation. Pt was left sitting up in bed with all needs met.   Therapy Documentation Precautions:  Precautions Precautions: Fall Precaution Comments: NO knee flexion Required Braces or Orthoses: Other Brace Knee Immobilizer - Right: On at all times Knee Immobilizer - Left: On at all times Other Brace: Bilateral Bledsoe braces locked in extension Restrictions Weight Bearing Restrictions: No RLE Weight Bearing: Weight bearing as  tolerated LLE Weight Bearing: Weight bearing as tolerated   Therapy/Group: Individual Therapy  Curtis Sites 08/09/2018, 7:15 AM

## 2018-08-10 ENCOUNTER — Inpatient Hospital Stay (HOSPITAL_COMMUNITY): Payer: Self-pay | Admitting: Physical Therapy

## 2018-08-10 ENCOUNTER — Inpatient Hospital Stay (HOSPITAL_COMMUNITY): Payer: Self-pay

## 2018-08-10 ENCOUNTER — Inpatient Hospital Stay (HOSPITAL_COMMUNITY): Payer: Self-pay | Admitting: Occupational Therapy

## 2018-08-10 DIAGNOSIS — H938X3 Other specified disorders of ear, bilateral: Secondary | ICD-10-CM

## 2018-08-10 LAB — GLUCOSE, CAPILLARY
Glucose-Capillary: 121 mg/dL — ABNORMAL HIGH (ref 70–99)
Glucose-Capillary: 146 mg/dL — ABNORMAL HIGH (ref 70–99)
Glucose-Capillary: 128 mg/dL — ABNORMAL HIGH (ref 70–99)
Glucose-Capillary: 111 mg/dL — ABNORMAL HIGH (ref 70–99)

## 2018-08-10 MED ORDER — CARBAMIDE PEROXIDE 6.5 % OT SOLN
5.0000 [drp] | Freq: Two times a day (BID) | OTIC | Status: DC
  Administered 2018-08-10 – 2018-08-21 (×5): 5 [drp] via OTIC
  Filled 2018-08-10: qty 15

## 2018-08-10 NOTE — Progress Notes (Signed)
Social Work Patient ID: Larry Fowler, male   DOB: October 02, 1970, 48 y.o.   MRN: [id number]   Have reviewed team conference with pt who is aware that team continues to support ELOS of 3 weeks.  Explained that our primary focus is making good functional gains in mobility while trying to determine what DME options are going to be available to him and making sure that home is accessible.  He is uncertain what his wife has done in terms of ramp for home.  Home measurement sheet should have been provided to wife and stressed need to get this completed ASAP.   Have reached out to rep at [employer] (formerly [employer] [facility] [employer]) to discuss DME options and hope to determine what might be available to pt ASAP.    Will continue to follow and monitor case with team.  Amada Jupiter, LCSW

## 2018-08-10 NOTE — Progress Notes (Signed)
Physical Therapy Session Note  Patient Details  Name: Larry Fowler MRN: [id number] Date of Birth: 27-Aug-1970  Today's Date: 08/10/2018 PT Individual Time: 1610-1650 PT Individual Time Calculation (min): 40 min   Short Term Goals: Week 1:  PT Short Term Goal 1 (Week 1): Pt will perform sit<>supine with mod assist  PT Short Term Goal 2 (Week 1): Pt will remain standing x 5 minutes on TIS bed.  PT Short Term Goal 3 (Week 1): Pt will perform sit<>stand from elevated surface with max + 2 assist    Skilled Therapeutic Interventions/Progress Updates:   Pt in supine and agreeable to therapy, no c/o pain at rest. Transferred to w/c via maxi-sky and transported to/from therapy gym total assist in w/c. Maxi-sky transfer to edge of mat in therapy gym. Session focused on problem solving sit<>stand transfer from elevated mat surface while maintaining straight LEs. Ultimately performed sit<>stand from elevated mat surface to bariatric RW. 1 therapist at each LE to block knee in event of any buckling, 1 therapist to brace RW, and another therapist managing height of mat and height of maxisky. Pt utilized [employer] bar w/ BUEs to pull himself into standing and then transition to RW. Performed x2 w/ minimal physical assist other than therapists providing hands-on for pt confidence and for safety. 1st stand for 30 sec, 2nd stand for 45-60 sec. During 2nd stand, pt able to move LEs to reposition more underneath his body w min physical assist. Verbal and tactile cues for upright posture throughout standing. No knee buckling noted and mild increase in bilateral knee pain that resolves w/ returning to seated. Pt very encouraged and excited about standing. Returned to room via w/c, ended session in w/c w/ all needs in reach.   Therapy Documentation Precautions:  Precautions Precautions: Fall Precaution Comments: NO knee flexion Required Braces or Orthoses: Other Brace Knee Immobilizer - Right: On at all  times Knee Immobilizer - Left: On at all times Other Brace: Bilateral Bledsoe braces locked in extension Restrictions Weight Bearing Restrictions: No RLE Weight Bearing: Weight bearing as tolerated LLE Weight Bearing: Weight bearing as tolerated Vital Signs: Therapy Vitals Temp: 99.1 F (37.3 C) Temp Source: Oral Pulse Rate: 61 Resp: 16 BP: 132/69 Patient Position (if appropriate): Lying Oxygen Therapy SpO2: (!) 88 % O2 Device: Room Air  Therapy/Group: Individual Therapy  Ulanda Tackett K Hayla Hinger 08/10/2018, 5:00 PM

## 2018-08-10 NOTE — Progress Notes (Signed)
[facility] PHYSICAL MEDICINE & REHABILITATION PROGRESS NOTE  Subjective/Complaints: Patient seen laying in bed this morning.  He states he slept well overnight.  He has questions regarding bracing and asks for the third time if he can wear it his knee immobilizer at night.  He complains of fluid in his ears and states that he has had this since admission with no resolution.  ROS: Denies CP, shortness of breath, nausea, vomiting, diarrhea.  Objective: Vital Signs: Blood pressure (!) 135/57, pulse 68, temperature 98.4 F (36.9 C), temperature source Oral, resp. rate 18, height 6\' 2"  (1.88 m), weight (!) 226.9 kg, SpO2 98 %. No results found. No results for input(s): WBC, HGB, HCT, PLT in the last 72 hours. Recent Labs    08/08/18 0556  CREATININE 0.65    Physical Exam: BP (!) 135/57 (BP Location: Left Arm)   Pulse 68   Temp 98.4 F (36.9 C) (Oral)   Resp 18   Ht 6\' 2"  (1.88 m)   Wt (!) 226.9 kg   SpO2 98%   BMI 64.22 kg/m  Constitutional: No distress . Vital signs reviewed.  Morbidly obese. HENT: Normocephalic.  Atraumatic. Eyes: EOMI. No discharge. Cardiovascular: RRR.  No JVD. Respiratory: CTA bilaterally.  Normal effort. GI: BS +. Non-distended. Musc: Bilateral lower edema and tenderness  Neurological: He is alert and oriented Motor: Bilateral upper extremities: 5/5 proximal to distal Right lower extremity: Hip flexion 3/5, Knee extension locked in brace, ankle dorsiflexion 5/5, improving Left lower extremity: Hip flexion 3+/5, knee extension locked in brace, ankle dorsiflexion 5/5, improving Skin: Bilateral Bledsoe braces in place with incisions C/D/I  Psychiatric: He has a normal mood and affect. His behavior is normal  Assessment/Plan: 1. 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  Physiatrist is providing close team  supervision and 24 hour management of active medical problems listed below.  Physiatrist and rehab team continue to assess barriers to discharge/monitor patient progress toward functional and medical goals  Care Tool:  Bathing    Body parts bathed by patient: Right arm, Left arm, Chest, Abdomen, Face, Front perineal area, Right upper leg, Left upper leg   Body parts bathed by helper: Buttocks Body parts n/a: Left lower leg, Right lower leg(Did not attempt)   Bathing assist Assist Level: Moderate Assistance - Patient 50 - 74%     Upper Body Dressing/Undressing Upper body dressing   What is the patient wearing?: Pull over shirt    Upper body assist Assist Level: Supervision/Verbal cueing    Lower Body Dressing/Undressing Lower body dressing      What is the patient wearing?: Pants     Lower body assist Assist for lower body dressing: Maximal Assistance - Patient 25 - 49%     Toileting Toileting    Toileting assist Assist for toileting: 2 Helpers     Transfers Chair/bed transfer  Transfers assist  Chair/bed transfer activity did not occur: Safety/medical concerns  Chair/bed transfer assist level: Dependent - mechanical lift     Locomotion Ambulation   Ambulation assist   Ambulation activity did not occur: Safety/medical concerns          Walk 10 feet activity   Assist  Walk 10 feet activity did not occur: Safety/medical concerns        Walk 50 feet activity   Assist Walk 50 feet with 2 turns activity did not occur:  Safety/medical concerns         Walk 150 feet activity   Assist Walk 150 feet activity did not occur: Safety/medical concerns         Walk 10 feet on uneven surface  activity   Assist Walk 10 feet on uneven surfaces activity did not occur: Safety/medical concerns         Wheelchair     Assist   Type of Wheelchair: Manual Wheelchair activity did not occur: Safety/medical concerns  Wheelchair assist level:  Supervision/Verbal cueing Max wheelchair distance: 153ft    Wheelchair 50 feet with 2 turns activity    Assist    Wheelchair 50 feet with 2 turns activity did not occur: Safety/medical concerns   Assist Level: Supervision/Verbal cueing   Wheelchair 150 feet activity     Assist Wheelchair 150 feet activity did not occur: Safety/medical concerns          Medical Problem List and Plan: 1.  Decreased functional mobility secondary to bilateral quadricep tendon rupture. Status post repair of bilateral tendon rupture repair 07/21/2018. Weightbearing as tolerated. Bilateral Bledsoe brace locked in extension             Continue CIR  Will discuss bracing with Ortho given patient's persistent questioning 2.  Antithrombotics: -DVT/anticoagulation:  Subcutaneous Lovenox 110 mg daily.   Lower extremity vascular ultrasound limited, but negative             -antiplatelet therapy: Not applicable 3. Pain Management:  Lidoderm patch,OxyContin sustained release 10 mg BID  Wean oxycontin to qhs, DC'd on 2/5  Oxycodone immediate release for breakthrough pain 4. Mood:  Zoloft 25 mg daily. Provide emotional support  Appreciate Neuropsych consult- discussed with provider previously             -antipsychotic agents:  Not applicable 5. Neuropsych: This patient is capable of making decisions on his own behalf. 6. Skin/Wound Care: Stage I pressure ulcer. Follow-up wound care nurse Routine skin checks 7. Fluids/Electrolytes/Nutrition:  Routine interval mouth with follow-up  BMP within acceptable range on 2/26, creatinine within normal limits on 3/3 8. Acute blood loss anemia. Transfused 1 unit packed red blood cells postoperatively.   Hemoglobin 8.4 on 2/26   Labs ordered for tomorrow  Continue to monitor 9.  Super super obesity. Follow-up dietary services 10. New findingsType 2 diabetes mellitus. Hemoglobin A1c 7.1. Glucophage 500 mg daily Check blood sugars before meals and at bedtime.  Diabetic teaching  Monitor with increased mobility CBG (last 3)  Recent Labs    08/09/18 2136 08/10/18 0649 08/10/18 1159  GLUCAP 159* 121* 146*   Slightly labile on 3/5 11. Acute otitis media. Augmentin 875-125 mg initiated 07/30/2018. 12. Constipation. Laxative assistance 13. Asthma/OSA/tobacco/ marijuana abuse. CPAP as directed.Continue Symbicort as well as nebulizer treatments. Provide counseling regards to tobacco abuse 14.  Hypoalbuminemia  Supplement initiated on 2/26, DC'd on 3/4 per patient 15.  Labile blood pressure   Vitals:   08/09/18 2004 08/10/18 0431  BP: (!) 116/50 (!) 135/57  Pulse: 67 68  Resp: 18 18  Temp: 98.5 F (36.9 C) 98.4 F (36.9 C)  SpO2: 98% 98%   Labile on 3/5 16.  Sleep disturbance  Restoril started on 2/27  Improved 17. Sore throat  Chloraseptic spray ordered 18.  Pruritus  Benadryl cream ordered on 3/2  Improved 19.  Congestion in ears  Debrox ordered  LOS: 9 days A FACE TO FACE EVALUATION WAS PERFORMED  Lucyann Romano Karis Juba 08/10/2018, 12:45 PM

## 2018-08-10 NOTE — Progress Notes (Signed)
Physical Therapy Session Note  Patient Details  Name: Larry Fowler MRN: [id number] Date of Birth: 1971-03-05  Today's Date: 08/10/2018 PT Individual Time: 1300-1400   60 min   Short Term Goals: Week 1:  PT Short Term Goal 1 (Week 1): Pt will perform sit<>supine with mod assist  PT Short Term Goal 2 (Week 1): Pt will remain standing x 5 minutes on TIS bed.  PT Short Term Goal 3 (Week 1): Pt will perform sit<>stand from elevated surface with max + 2 assist    Skilled Therapeutic Interventions/Progress Updates:   Pt received supine in bed and agreeable to PT. Rolling R and L to place Maxi sky lift with CGA from PT. PT adjusted Bil bledsoe braces to improve knee stability and reduce pain at pressure points in ankles. Maxi sky transfer to [name]. WC mobility x 161f with supervision assist from PT and moderate cues for doorway management. Maxi sky transfer to mat table. PT instructed pt in scooting laterally on EOM with min assist and moderate cues for proper use of UE and to maintain knee extension while sitting EOB. Pt able to scoot 1 foot R and L . Education for use of SB at d/c for functional movement within house.   [facility] returned to day [name] and all needs met.            Therapy Documentation Precautions:  Precautions Precautions: Fall Precaution Comments: NO knee flexion Required Braces or Orthoses: Other Brace Knee Immobilizer - Right: On at all times Knee Immobilizer - Left: On at all times Other Brace: Bilateral Bledsoe braces locked in extension Restrictions Weight Bearing Restrictions: No RLE Weight Bearing: Weight bearing as tolerated LLE Weight Bearing: Weight bearing as tolerated Vital Signs: Therapy Vitals Temp: 99.1 F (37.3 C) Temp Source: Oral Pulse Rate: 61 Resp: 16 BP: 132/69 Patient Position (if appropriate): Lying Oxygen Therapy SpO2: (!) 88 % O2 Device: Room Air Pain:    Denies at  rest   Therapy/Group: Individual Therapy  ALorie Phenix3/10/2018, 3:27 PM

## 2018-08-10 NOTE — Progress Notes (Signed)
Occupational Therapy Weekly Progress Note  Patient Details  Name: Larry Fowler MRN: [id number] Date of Birth: 17-Jan-1971  Beginning of progress report period: August 02, 2018 End of progress report period: August 10, 2018  Today's Date: 08/10/2018 OT Individual Time: 0017-4944 OT Individual Time Calculation (min): 72 min    Patient has met 0 of 5 short term goals.  Pt has made slower progress than expected up to the point secondary to not being able to bend his LEs at the knees based on MD precautions.  He is able to complete UB bathing and dressing with supervision in supine with HOB up.  He is using a LH sponge some to assist with washing his front peri area but he is not able to clean them efficiently from this position.  He needs total assist for thoroughness as well as for washing buttocks in sidelying.  He can currently roll side to side in the bariatric bed with supervision however, using the rails for support.  He continues to need total assist +2 (pt less than 10%) with use of the maxi sky for transfers to the bariatric 3:1 and for transfer to the bariatric recliner.  He has been utilizing the bed for some weightbearing through his LEs but has only been able to tolerate around 70 degrees bed incline for 30-45 seconds.  Feel pt needs continued OT to further progress ADL function to sit to stand in order to return home with his spouse.  Will continue with current OT treatment plan.   Patient continues to demonstrate the following deficits: muscle weakness and decreased standing balance and therefore will continue to benefit from skilled OT intervention to enhance overall performance with BADL and Reduce care partner burden.  Patient progressing toward long term goals..  Continue plan of care.  OT Short Term Goals Week 2:  OT Short Term Goal 1 (Week 2): Pt will tolerate sitting EOB for UB selfcare with supervision for 15 mins.   OT Short Term Goal 2 (Week 2): Pt will perform LB bathing  supine to sit with mod assist.  OT Short Term Goal 3 (Week 2): Pt will tolerate tilted bed up to 75 degrees for at least 2 mins in preparation for  OT Short Term Goal 4 (Week 2): Pt will use sockaide to donn gripper socks in sitting with setup only.   OT Short Term Goal 5 (Week 2): Pt will perform stand pivot transfer to bariatric 3:1 with total assist +2 (pt 30%).  Skilled Therapeutic Interventions/Progress Updates:    Pt completed bathing and dressing supine during session.  He was able to complete all UB bathing and dressing supine with supervision, except for washing his back, which requires max assist.  Total assist was needed for washing front and back peri area as well as applying powder.  He was able to use the LH sponge to wash some of his front peri area, but he was unable to get it thoroughly.  He rolled both right and left using the rails for positioning of the lift sling as well as when therapist was washing and drying his buttocks.  Finished session with transfer to the bariatric toilet with use of the maxi sky and total assist +2 (pt 20%).  Pt left on toilet seat with BLEs supported in chair and call button and phone in reach.    Therapy Documentation Precautions:  Precautions Precautions: Fall Precaution Comments: NO knee flexion Required Braces or Orthoses: Other Brace Knee Immobilizer - Right:  On at all times Knee Immobilizer - Left: On at all times Other Brace: Bilateral Bledsoe braces locked in extension Restrictions Weight Bearing Restrictions: No RLE Weight Bearing: Weight bearing as tolerated LLE Weight Bearing: Weight bearing as tolerated  Pain: Pain Assessment Pain Scale: 0-10 Pain Score: 0-No pain ADL: See Care Tool Section for some details of ADL  Therapy/Group: Individual Therapy  Senan Urey OTR/L 08/10/2018, 12:26 PM

## 2018-08-10 NOTE — Patient Care Conference (Signed)
Inpatient RehabilitationTeam Conference and Plan of Care Update Date: 08/09/2018   Time: 11:40 AM    Patient Name: Larry Fowler      Medical Record Number: [id number]  Date of Birth: 27-Mar-1971 Sex: Male         Room/Bed: 4W15C/4W15C-01 Payor Info: Payor: [employer] / Plan: [place] [employer]OC[employer] / Product Type: *No Product type* /    Admitting Diagnosis: BQUAD PENDON RUPURES  Admit Date/Time:  08/01/2018  3:58 PM Admission Comments: No comment available   Primary Diagnosis:  <principal problem not specified> Principal Problem: <principal problem not specified>  Patient Active Problem List   Diagnosis Date Noted  . Congestion of both ears   . Pruritus   . Episode of recurrent major depressive disorder (HCC)   . Labile blood glucose   . Sore throat   . Sleep disturbance   . Labile blood pressure   . Hypoalbuminemia due to protein-calorie malnutrition (HCC)   . New onset type 2 diabetes mellitus (HCC)   . Marijuana abuse   . Drug induced constipation   . Otitis media   . Acute blood loss anemia   . Pressure injury of skin 07/30/2018  . Anxiety and depression   . Super-super obese (HCC)   . Postoperative pain   . Tobacco abuse   . Postoperative anemia due to acute blood loss 07/23/2018    Class: Acute  . Rupture of left quadriceps muscle   . Quadriceps tendon rupture 07/20/2018  . Nutritional counseling 07/03/2018  . Disorder of tendon of left biceps 12/05/2017  . Hypogonadism in male 10/25/2017  . Hyperglycemia   . Preventative health care 03/21/2017  . Morbid obesity (HCC) 03/21/2017  . OSA (obstructive sleep apnea) 03/21/2017  . Chronic pain 03/21/2017  . Smoker 03/21/2017  . HTN (hypertension) 03/21/2017  . Fatigue 03/21/2017  . Left inguinal hernia 03/21/2017  . Rash 03/21/2017  . History of cocaine use   . Cannabis abuse   . Renal stone   . Anal abscess, ? fistula 04/05/2011  . Asthma 12/17/2009  . 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  DEPRESSION 09/12/2007  . Allergic rhinitis 09/12/2007  . ANXIETY 02/16/2007    Expected Discharge Date: Expected Discharge Date: (3 weeks)  Team Members Present: Physician leading conference: Dr. Claudette Laws Social Worker Present: Amada Jupiter, LCSW Nurse Present: Ronny Bacon, RN PT Present: Grier Rocher, PT OT Present: Other (comment)(Sandra Earlene Plater, OT) SLP Present: Jackalyn Lombard, SLP PPS Coordinator present : Fae Pippin     Current Status/Progress Goal Weekly Team Focus  Medical   Decreased functional mobility secondary to bilateral quadricep tendon rupture. Status post repair of bilateral tendon rupture repair 07/21/2018  Improve mobility, transfers, self-care, DM, BP, pain  See above   Bowel/Bladder   Continent of bowel and bladder  remain continent      Swallow/Nutrition/ Hydration             ADL's   (S) UB, mod A LB with LH sponge, total +2 transfers via hoyer  supervision to min assist overall  UB/LB bathing/dressing, balance strategies, ADL transfers, family edu    Mobility   Rolling R and L with min assist. Maxi sky transfers. Supervision assist WC mobility up to 170ft.   Mod assist transfers and from [facility]. gait with RW up to 10-15 ft with mod assist adn Bil KI.   improved safety with WC mobility, transfers, WB through BLE without flexion in knees    Communication  Safety/Cognition/ Behavioral Observations            Pain   Pain under control with current pain medication.  Pain free or pain less than 3  assess every shift   Skin   stage 2 on bottom, Garharts butt cream and nystatin powder for skin breakdown  no further skin breakdown and no new skin issues  assess every shift.    Rehab Goals Patient on target to meet rehab goals: Yes *See Care Plan and progress notes for long and short-term goals.     Barriers to Discharge  Current Status/Progress Possible Resolutions Date Resolved   Physician    Medical stability;Weight;Decreased  caregiver support;Lack of/limited family support;Other (comments)  Super super obese  See above  Therapies, optimize DM meds, optimize pain meds, consider BP meds if necessary      Nursing                  PT                    OT                  SLP                SW                Discharge Planning/Teaching Needs:  Pt to d/c home with wife as primary caregiver;  other family will likely need to assist in order to covery 24/7 care.  Teaching needs TBD.   Team Discussion:  Continue to use sky lift;  "standing" in bed @ ~ 80 degress for ~ 5 secs.  Min assist for rolling in bed.  Min/ mod assist bed mobility goal?  Mod - max b/d.  Using maxi sky to bsc for toileting.  Very concerned about mobility and accessibility in the home as well as anticipated assist needs.  Need to begin work on AE needs now - tx agreed.    Revisions to Treatment Plan:  NA    Continued Need for Acute Rehabilitation Level of Care: The patient requires daily medical management by a physician with specialized training in physical medicine and rehabilitation for the following conditions: Daily direction of a multidisciplinary physical rehabilitation program to ensure safe treatment while eliciting the highest outcome that is of practical value to the patient.: Yes Daily medical management of patient stability for increased activity during participation in an intensive rehabilitation regime.: Yes Daily analysis of laboratory values and/or radiology reports with any subsequent need for medication adjustment of medical intervention for : Post surgical problems;Diabetes problems;Other   I attest that I was present, lead the team conference, and concur with the assessment and plan of the team.   Ruba Outen 08/10/2018, 2:21 PM

## 2018-08-10 NOTE — Progress Notes (Signed)
Patient very tearful today. Was able to sit in wheelchair out in the day room. He is very concerned that he will not be able to do the things he was able to do before the fall. He is also scared that he will not be able to walk again. I continued to motivate and encourage him to make small steps in his recovery.

## 2018-08-11 ENCOUNTER — Inpatient Hospital Stay (HOSPITAL_COMMUNITY): Payer: Self-pay | Admitting: Occupational Therapy

## 2018-08-11 ENCOUNTER — Inpatient Hospital Stay (HOSPITAL_COMMUNITY): Payer: Self-pay | Admitting: Physical Therapy

## 2018-08-11 LAB — CBC WITH DIFFERENTIAL/PLATELET
Abs Immature Granulocytes: 0.03 10*3/uL (ref 0.00–0.07)
Basophils Absolute: 0 10*3/uL (ref 0.0–0.1)
Basophils Relative: 0 %
Eosinophils Absolute: 0.2 10*3/uL (ref 0.0–0.5)
Eosinophils Relative: 3 %
HCT: 32.3 % — ABNORMAL LOW (ref 39.0–52.0)
Hemoglobin: 9.8 g/dL — ABNORMAL LOW (ref 13.0–17.0)
Immature Granulocytes: 0 %
Lymphocytes Relative: 39 %
Lymphs Abs: 3.6 10*3/uL (ref 0.7–4.0)
MCH: 30.1 pg (ref 26.0–34.0)
MCHC: 30.3 g/dL (ref 30.0–36.0)
MCV: 99.1 fL (ref 80.0–100.0)
Monocytes Absolute: 0.7 10*3/uL (ref 0.1–1.0)
Monocytes Relative: 7 %
NEUTROS PCT: 51 %
Neutro Abs: 4.7 10*3/uL (ref 1.7–7.7)
Platelets: 215 10*3/uL (ref 150–400)
RBC: 3.26 MIL/uL — AB (ref 4.22–5.81)
RDW: 15.5 % (ref 11.5–15.5)
WBC: 9.3 10*3/uL (ref 4.0–10.5)
nRBC: 0 % (ref 0.0–0.2)

## 2018-08-11 LAB — GLUCOSE, CAPILLARY
GLUCOSE-CAPILLARY: 127 mg/dL — AB (ref 70–99)
GLUCOSE-CAPILLARY: 133 mg/dL — AB (ref 70–99)
Glucose-Capillary: 110 mg/dL — ABNORMAL HIGH (ref 70–99)
Glucose-Capillary: 137 mg/dL — ABNORMAL HIGH (ref 70–99)

## 2018-08-11 NOTE — Progress Notes (Signed)
Physical Therapy Weekly Progress Note  Patient Details  Name: Larry Fowler MRN: [id number] Date of Birth: 11/09/1970  Beginning of progress report period: August 02, 2018 End of progress report period: August 11, 2018  Today's Date: 08/11/2018 PT Individual Time: 1100-1200 PT Individual Time Calculation (min): 60 min   Patient has met 1 of 3 short term goals.  Pt making slow progress towards long term goals. Pt has progressed to be able to maintain standing on tilt table for 5 min, and propell WC through hall with min-supervision assist. Pt requires + 4 assist for safety to come to standing from elevated mat table for safety.   Patient continues to demonstrate the following deficits muscle weakness and muscle joint tightness and decreased sitting balance, decreased standing balance, decreased balance strategies and difficulty maintaining precautions and therefore will continue to benefit from skilled PT intervention to increase functional independence with mobility.  Patient progressing toward long term goals..  Continue plan of care.  PT Short Term Goals Week 1:  PT Short Term Goal 1 (Week 1): Pt will perform sit<>supine with mod assist  PT Short Term Goal 1 - Progress (Week 1): Progressing toward goal PT Short Term Goal 2 (Week 1): Pt will remain standing x 5 minutes on TIS bed.  PT Short Term Goal 2 - Progress (Week 1): Met PT Short Term Goal 3 (Week 1): Pt will perform sit<>stand from elevated surface with max + 2 assist   PT Short Term Goal 3 - Progress (Week 1): Progressing toward goal Week 2:  PT Short Term Goal 1 (Week 2): Pt will propell WC > 180f with superivsion assist through rehab unit  PT Short Term Goal 2 (Week 2): Pt [facility]  PT Short Term Goal 3 (Week 2): Pt will perform sit<>stand form mat table with mod assist   Skilled Therapeutic Interventions/Progress Updates:   Pt received supine in bed and agreeable to  PT. Rolling R and L to place Maxi sky sling. PT wrapping BLE and adjusted Bil bledsoe braceded to improved knee stability. [facility] with min assist in room and supervision assist in hall. Maxi sky transfer to mat table. [facility] for [facility]. Mod cues for set up, sequening, and safety positioning  Pt left sitting in WC in day room with all needs met and RN aware of pt location.      Therapy Documentation Precautions:  Precautions Precautions: Fall Precaution Comments: NO knee flexion Required Braces or Orthoses: Other Brace Knee Immobilizer - Right: On at all times Knee Immobilizer - Left: On at all times Other Brace: Bilateral Bledsoe braces locked in extension Restrictions Weight Bearing Restrictions: No RLE Weight Bearing: Weight bearing as tolerated LLE Weight Bearing: Weight bearing as tolerated    Vital Signs: Therapy Vitals Temp: 98.5 F (36.9 C) Temp Source: Oral Pulse Rate: 61 Resp: 18 BP: (!) 118/57 Patient Position (if appropriate): Lying Oxygen Therapy SpO2: 98 % O2 Device: Room Air Pain:   0/10 AT REST  Therapy/Group: Individual Therapy  ALorie Phenix3/11/2018, 8:03 AM

## 2018-08-11 NOTE — Progress Notes (Signed)
Occupational Therapy Session Note  Patient Details  Name: Larry Fowler MRN: [id number] Date of Birth: 06/04/1971  Today's Date: 08/11/2018 OT Individual Time: 0903-1003 OT Individual Time Calculation (min): 60 min    Short Term Goals: Week 2:  OT Short Term Goal 1 (Week 2): Pt will tolerate sitting EOB for UB selfcare with supervision for 15 mins.   OT Short Term Goal 2 (Week 2): Pt will perform LB bathing supine to sit with mod assist.  OT Short Term Goal 3 (Week 2): Pt will tolerate tilted bed up to 75 degrees for at least 2 mins in preparation for  OT Short Term Goal 4 (Week 2): Pt will use sockaide to donn gripper socks in sitting with setup only.   OT Short Term Goal 5 (Week 2): Pt will perform stand pivot transfer to bariatric 3:1 with total assist +2 (pt 30%).  Skilled Therapeutic Interventions/Progress Updates:      Session 1:  (7290-2111) Pt completed bathing and dressing in supine with HOB up to HOB flat during session.  He was able to complete UB bathing with supervision but needed max assist for LB bathing.  Total assist for removal and placement of the Bledsoe braces in order to wash his lower legs.  Supervision for rolling side to side in order for therapist to wash his back and buttocks.  He was able to utilize a LH sponge with setup for washing his front peri area, but needed some assist from therapist (mod) for thoroughness secondary to some skin breakdown.  He was able to donn a pullover shirt in supine with HOB up but needed total assist for donning pullover shorts and gripper socks.  He was setup for toothbrush and water in order to complete oral hygiene.  Finished session with pt in bed and call button and phone in reach.     Session 2: (5520-8022)  Pt completed transfer from the wheelchair to the tilt table with use of the maxisky and +2 assist for managing his LE's.  Once on the tilt table, he worked on BLE weightbearing for intervals of 3-5 mins with angle from 62 all  the way up to 71 degrees.  Had pt focus on activation of knee extension in weightbearing while in the bledsoe braces.  He completed approximately 4 intervals with rest breaks of 2-3 mins in between before transferring back to the bed to rest via maxisky.  Finished session with call button and phone in reach.    Therapy Documentation Precautions:  Precautions Precautions: Fall Precaution Comments: NO knee flexion Required Braces or Orthoses: Other Brace Knee Immobilizer - Right: On at all times Knee Immobilizer - Left: On at all times Other Brace: Bilateral Bledsoe braces locked in extension Restrictions Weight Bearing Restrictions: No RLE Weight Bearing: Weight bearing as tolerated LLE Weight Bearing: Weight bearing as tolerated  Pain: Pain Assessment Pain Scale: Faces Faces Pain Scale: Hurts whole lot Pain Location: Knee Pain Orientation: Right;Left Pain Descriptors / Indicators: Discomfort Pain Onset: With Activity Pain Intervention(s): Repositioned;Emotional support ADL: See Care Tool Section for some details of ADL  Therapy/Group: Individual Therapy  Zelma Snead OTR/L 08/11/2018, 4:00 PM

## 2018-08-11 NOTE — Progress Notes (Signed)
[facility] PHYSICAL MEDICINE & REHABILITATION PROGRESS NOTE  Subjective/Complaints: Patient seen laying in bed this morning.  He states he did not sleep well overnight.  He is happy that he was able to take a step with therapies yesterday.  ROS: Denies CP, shortness of breath, nausea, vomiting, diarrhea.  Objective: Vital Signs: Blood pressure (!) 118/57, pulse 61, temperature 98.5 F (36.9 C), temperature source Oral, resp. rate 18, height 6\' 2"  (1.88 m), weight (!) 226.9 kg, SpO2 98 %. No results found. Recent Labs    08/11/18 0412  WBC 9.3  HGB 9.8*  HCT 32.3*  PLT 215   No results for input(s): NA, K, CL, CO2, GLUCOSE, BUN, CREATININE, CALCIUM in the last 72 hours.  Physical Exam: BP (!) 118/57 (BP Location: Right Wrist)   Pulse 61   Temp 98.5 F (36.9 C) (Oral)   Resp 18   Ht 6\' 2"  (1.88 m)   Wt (!) 226.9 kg   SpO2 98%   BMI 64.22 kg/m  Constitutional: No distress . Vital signs reviewed.  Morbidly obese. HENT: Normocephalic.  Atraumatic. Eyes: EOMI. No discharge. Cardiovascular: RRR.  No JVD. Respiratory: CTA bilaterally.  Normal effort. GI: BS +. Non-distended. Musc: Bilateral lower edema and tenderness  Neurological: He is alert and oriented Motor: Bilateral upper extremities: 5/5 proximal to distal Right lower extremity: Hip flexion 3+/5, Knee extension locked in brace, ankle dorsiflexion 5/5, improving Left lower extremity: Hip flexion 3+/5, knee extension locked in brace, ankle dorsiflexion 5/5, improving Skin: Bilateral Bledsoe braces in place with incisions C/D/I  Psychiatric: He has a normal mood and affect. His behavior is normal  Assessment/Plan: 1. 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  Physiatrist is providing close team supervision and 24 hour management of active medical problems listed  below.  Physiatrist and rehab team continue to assess barriers to discharge/monitor patient progress toward functional and medical goals  Care Tool:  Bathing    Body parts bathed by patient: Right arm, Left arm, Chest, Abdomen, Face, Front perineal area, Right upper leg, Left upper leg   Body parts bathed by helper: Buttocks Body parts n/a: Left lower leg, Right lower leg(Did not attempt)   Bathing assist Assist Level: Moderate Assistance - Patient 50 - 74%     Upper Body Dressing/Undressing Upper body dressing   What is the patient wearing?: Pull over shirt    Upper body assist Assist Level: Supervision/Verbal cueing    Lower Body Dressing/Undressing Lower body dressing      What is the patient wearing?: Pants     Lower body assist Assist for lower body dressing: Maximal Assistance - Patient 25 - 49%     Toileting Toileting    Toileting assist Assist for toileting: 2 Helpers     Transfers Chair/bed transfer  Transfers assist  Chair/bed transfer activity did not occur: Safety/medical concerns  Chair/bed transfer assist level: Dependent - mechanical lift     Locomotion Ambulation   Ambulation assist   Ambulation activity did not occur: Safety/medical concerns          Walk 10 feet activity   Assist  Walk 10 feet activity did not occur: Safety/medical concerns        Walk 50 feet activity   Assist Walk 50 feet with 2 turns activity did not occur: Safety/medical concerns         Walk 150  feet activity   Assist Walk 150 feet activity did not occur: Safety/medical concerns         Walk 10 feet on uneven surface  activity   Assist Walk 10 feet on uneven surfaces activity did not occur: Safety/medical concerns         Wheelchair     Assist   Type of Wheelchair: Manual Wheelchair activity did not occur: Safety/medical concerns  Wheelchair assist level: Supervision/Verbal cueing Max wheelchair distance: 151ft     Wheelchair 50 feet with 2 turns activity    Assist    Wheelchair 50 feet with 2 turns activity did not occur: Safety/medical concerns   Assist Level: Supervision/Verbal cueing   Wheelchair 150 feet activity     Assist Wheelchair 150 feet activity did not occur: Safety/medical concerns          Medical Problem List and Plan: 1.  Decreased functional mobility secondary to bilateral quadricep tendon rupture. Status post repair of bilateral tendon rupture repair 07/21/2018. Weightbearing as tolerated. Bilateral Bledsoe brace locked in extension             Continue CIR 2.  Antithrombotics: -DVT/anticoagulation:  Subcutaneous Lovenox 110 mg daily.   Lower extremity vascular ultrasound limited, but negative             -antiplatelet therapy: Not applicable 3. Pain Management:  Lidoderm patch,OxyContin sustained release 10 mg BID  Wean oxycontin to qhs, DC'd on 2/5  Oxycodone immediate release for breakthrough pain 4. Mood:  Zoloft 25 mg daily. Provide emotional support  Appreciate Neuropsych consult- discussed with provider previously             -antipsychotic agents:  Not applicable 5. Neuropsych: This patient is capable of making decisions on his own behalf. 6. Skin/Wound Care: Stage I pressure ulcer. Follow-up wound care nurse Routine skin checks 7. Fluids/Electrolytes/Nutrition:  Routine interval mouth with follow-up  BMP within acceptable range on 2/26, creatinine within normal limits on 3/3 8. Acute blood loss anemia. Transfused 1 unit packed red blood cells postoperatively.   Hemoglobin 9.8 on 3/6  Continue to monitor 9.  Super super obesity. Follow-up dietary services 10. New findingsType 2 diabetes mellitus. Hemoglobin A1c 7.1. Glucophage 500 mg daily Check blood sugars before meals and at bedtime. Diabetic teaching  Monitor with increased mobility CBG (last 3)  Recent Labs    08/10/18 1658 08/10/18 2133 08/11/18 0625  GLUCAP 128* 111* 127*   Slightly  labile on 3/6 11. Acute otitis media. Augmentin 875-125 mg initiated 07/30/2018. 12. Constipation. Laxative assistance 13. Asthma/OSA/tobacco/ marijuana abuse. CPAP as directed.Continue Symbicort as well as nebulizer treatments. Provide counseling regards to tobacco abuse 14.  Hypoalbuminemia  Supplement initiated on 2/26, DC'd on 3/4 per patient 15.  Labile blood pressure   Vitals:   08/11/18 0500 08/11/18 0624  BP: (!) 118/57   Pulse: (!) 59 61  Resp: 18   Temp: 98.5 F (36.9 C)   SpO2: 98% 98%   Labile on 3/6 16.  Sleep disturbance  Restoril started on 2/27  Improved 17. Sore throat  Chloraseptic spray ordered 18.  Pruritus  Benadryl cream ordered on 3/2  Improved 19.  Congestion in ears  Debrox ordered  Improving  LOS: 10 days A FACE TO FACE EVALUATION WAS PERFORMED  Nena Hampe Karis Juba 08/11/2018, 11:09 AM

## 2018-08-12 ENCOUNTER — Inpatient Hospital Stay (HOSPITAL_COMMUNITY): Payer: Self-pay | Admitting: Physical Therapy

## 2018-08-12 LAB — GLUCOSE, CAPILLARY
GLUCOSE-CAPILLARY: 156 mg/dL — AB (ref 70–99)
Glucose-Capillary: 124 mg/dL — ABNORMAL HIGH (ref 70–99)
Glucose-Capillary: 123 mg/dL — ABNORMAL HIGH (ref 70–99)
Glucose-Capillary: 138 mg/dL — ABNORMAL HIGH (ref 70–99)

## 2018-08-12 NOTE — Progress Notes (Signed)
Physical Therapy Session Note  Patient Details  Name: Larry Fowler MRN: [id number] Date of Birth: 03-24-1971  Today's Date: 08/12/2018 PT Individual Time: 1415-1500 PT Individual Time Calculation (min): 45 min   Short Term Goals: Week 1:  PT Short Term Goal 1 (Week 1): Pt will perform sit<>supine with mod assist  PT Short Term Goal 1 - Progress (Week 1): Progressing toward goal PT Short Term Goal 2 (Week 1): Pt will remain standing x 5 minutes on TIS bed.  PT Short Term Goal 2 - Progress (Week 1): Met PT Short Term Goal 3 (Week 1): Pt will perform sit<>stand from elevated surface with max + 2 assist   PT Short Term Goal 3 - Progress (Week 1): Progressing toward goal Week 2:  PT Short Term Goal 1 (Week 2): Pt will propell WC > 142f with superivsion assist through rehab unit  PT Short Term Goal 2 (Week 2): Pt [facility]  PT Short Term Goal 3 (Week 2): Pt will perform sit<>stand form mat table with mod assist   Skilled Therapeutic Interventions/Progress Updates:   Pt received supine in bed and requested additional time to finish meeting . PT returned in 15 minutes and agreeable to PT. PT applied Bledsoe braces to BLE. Rolling R and L with supervision assist to place maxi sky Maxi sky to WC.   WC mobility x 1577fwith supervision assist from PT. Pt able to navigate room and doorways with only min cues for turning technique.   UE therex with level 4 tband. Mid row 2 x 2 min. tricep extension 2 x 15. Diagonal scapular protraction 2 x 15 bil, bicep curls 2 x 20. Min cues from Pt for proper speed and ROM, as well as to decrease compensation.   [facility] and all needs met.         Therapy Documentation Precautions:  Precautions Precautions: Fall Precaution Comments: NO knee flexion Required Braces or Orthoses: Other Brace Knee Immobilizer - Right: On at all times Knee  Immobilizer - Left: On at all times Other Brace: Bilateral Bledsoe braces locked in extension Restrictions Weight Bearing Restrictions: No RLE Weight Bearing: Weight bearing as tolerated LLE Weight Bearing: Weight bearing as tolerated General: PT Amount of Missed Time (min): 15 Minutes PT Missed Treatment Reason: Other (Comment) Vital Signs: Therapy Vitals Temp: 98.6 F (37 C) Temp Source: Oral Pulse Rate: 67 Resp: 17 BP: (!) 124/57 Patient Position (if appropriate): Sitting Oxygen Therapy SpO2: 100 % O2 Device: Room Air Pain: 3/10 Bil knees.   Therapy/Group: Individual Therapy  [name]/12/2018, 3:52 PM

## 2018-08-12 NOTE — Progress Notes (Signed)
[facility] PHYSICAL MEDICINE & REHABILITATION PROGRESS NOTE  Subjective/Complaints: Patient seen sitting up in bed this morning eating breakfast.  He states he slept well overnight.  He starts to ask same questions regarding pain medications he has yesterday, but realized that he asked yesterday and received an answer.  ROS: Denies CP, shortness of breath, nausea, vomiting, diarrhea.  Objective: Vital Signs: Blood pressure (!) 111/54, pulse 64, temperature 98 F (36.7 C), temperature source Oral, resp. rate 18, height 6\' 2"  (1.88 m), weight (!) 226.9 kg, SpO2 99 %. No results found. Recent Labs    08/11/18 0412  WBC 9.3  HGB 9.8*  HCT 32.3*  PLT 215   No results for input(s): NA, K, CL, CO2, GLUCOSE, BUN, CREATININE, CALCIUM in the last 72 hours.  Physical Exam: BP (!) 111/54 (BP Location: Right Wrist)   Pulse 64   Temp 98 F (36.7 C) (Oral)   Resp 18   Ht 6\' 2"  (1.88 m)   Wt (!) 226.9 kg   SpO2 99%   BMI 64.22 kg/m  Constitutional: No distress . Vital signs reviewed.  Morbidly obese. HENT: Normocephalic.  Atraumatic. Eyes: EOMI. No discharge. Cardiovascular: RRR.  No JVD. Respiratory: CTA bilaterally.  Normal effort. GI: BS +. Non-distended. Musc: Bilateral lower edema and tenderness  Neurological: He is alert and oriented Motor: Bilateral upper extremities: 5/5 proximal to distal Right lower extremity: Hip flexion 3+/5, Knee extension locked in brace, ankle dorsiflexion 5/5, improving Left lower extremity: Hip flexion 3+/5, knee extension locked in brace, ankle dorsiflexion 5/5, proving Skin: Bilateral Bledsoe braces in place with incisions C/D/I  Psychiatric: He has a normal mood and affect. His behavior is normal  Assessment/Plan: 1. 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  Physiatrist is providing close team  supervision and 24 hour management of active medical problems listed below.  Physiatrist and rehab team continue to assess barriers to discharge/monitor patient progress toward functional and medical goals  Care Tool:  Bathing    Body parts bathed by patient: Right arm, Left arm, Chest, Abdomen, Face, Front perineal area, Right upper leg, Left upper leg(bed level)   Body parts bathed by helper: Right lower leg, Left lower leg, Buttocks Body parts n/a: Left lower leg, Right lower leg(Did not attempt)   Bathing assist Assist Level: Minimal Assistance - Patient > 75%     Upper Body Dressing/Undressing Upper body dressing   What is the patient wearing?: Pull over shirt    Upper body assist Assist Level: Supervision/Verbal cueing    Lower Body Dressing/Undressing Lower body dressing      What is the patient wearing?: Pants     Lower body assist Assist for lower body dressing: Maximal Assistance - Patient 25 - 49%     Toileting Toileting    Toileting assist Assist for toileting: 2 Helpers     Transfers Chair/bed transfer  Transfers assist  Chair/bed transfer activity did not occur: Safety/medical concerns  Chair/bed transfer assist level: Dependent - mechanical lift     Locomotion Ambulation   Ambulation assist   Ambulation activity did not occur: Safety/medical concerns          Walk 10 feet activity   Assist  Walk 10 feet activity did not occur: Safety/medical concerns        Walk 50 feet activity   Assist Walk 50 feet with 2 turns activity  did not occur: Safety/medical concerns         Walk 150 feet activity   Assist Walk 150 feet activity did not occur: Safety/medical concerns         Walk 10 feet on uneven surface  activity   Assist Walk 10 feet on uneven surfaces activity did not occur: Safety/medical concerns         Wheelchair     Assist   Type of Wheelchair: Manual Wheelchair activity did not occur:  Safety/medical concerns  Wheelchair assist level: Supervision/Verbal cueing Max wheelchair distance: 170ft    Wheelchair 50 feet with 2 turns activity    Assist    Wheelchair 50 feet with 2 turns activity did not occur: Safety/medical concerns   Assist Level: Supervision/Verbal cueing   Wheelchair 150 feet activity     Assist Wheelchair 150 feet activity did not occur: Safety/medical concerns          Medical Problem List and Plan: 1.  Decreased functional mobility secondary to bilateral quadricep tendon rupture. Status post repair of bilateral tendon rupture repair 07/21/2018. Weightbearing as tolerated. Bilateral Bledsoe brace locked in extension             Continue CIR 2.  Antithrombotics: -DVT/anticoagulation:  Subcutaneous Lovenox 110 mg daily.   Lower extremity vascular ultrasound limited, but negative             -antiplatelet therapy: Not applicable 3. Pain Management:  Lidoderm patch,OxyContin sustained release 10 mg BID  Wean oxycontin to qhs, DC'd on 3/5  Oxycodone immediate release for breakthrough pain 4. Mood:  Zoloft 25 mg daily. Provide emotional support  Appreciate Neuropsych consult- discussed with provider previously             -antipsychotic agents:  Not applicable 5. Neuropsych: This patient is capable of making decisions on his own behalf. 6. Skin/Wound Care: Stage I pressure ulcer. Follow-up wound care nurse Routine skin checks 7. Fluids/Electrolytes/Nutrition:  Routine interval mouth with follow-up  BMP within acceptable range on 2/26, creatinine within normal limits on 3/3  Labs ordered for Monday 8. Acute blood loss anemia. Transfused 1 unit packed red blood cells postoperatively.   Hemoglobin 9.8 on 3/6  Continue to monitor 9.  Super super obesity. Follow-up dietary services 10. New findingsType 2 diabetes mellitus. Hemoglobin A1c 7.1. Glucophage 500 mg daily Check blood sugars before meals and at bedtime. Diabetic teaching  Monitor  with increased mobility CBG (last 3)  Recent Labs    08/11/18 1705 08/11/18 2133 08/12/18 0625  GLUCAP 133* 137* 124*   Slightly elevated on 3/7 11. Acute otitis media. Augmentin 875-125 mg initiated 07/30/2018. 12. Constipation. Laxative assistance 13. Asthma/OSA/tobacco/ marijuana abuse. CPAP as directed.Continue Symbicort as well as nebulizer treatments. Provide counseling regards to tobacco abuse 14.  Hypoalbuminemia  Supplement initiated on 2/26, DC'd on 3/4 per patient 15.  Labile blood pressure   Vitals:   08/11/18 2042 08/12/18 0359  BP: (!) 127/55 (!) 111/54  Pulse: 71 64  Resp: 19 18  Temp: 98.2 F (36.8 C) 98 F (36.7 C)  SpO2: 98% 99%   Relatively controlled on 3/7 16.  Sleep disturbance  Restoril started on 2/27  Improved 17. Sore throat  Chloraseptic spray ordered 18.  Pruritus  Benadryl cream ordered on 3/2  Improved 19.  Congestion in ears  Debrox ordered  Improving  LOS: 11 days A FACE TO FACE EVALUATION WAS PERFORMED  Larry Fowler Karis Juba 08/12/2018, 9:11 AM

## 2018-08-13 ENCOUNTER — Inpatient Hospital Stay (HOSPITAL_COMMUNITY): Payer: Self-pay | Admitting: Physical Therapy

## 2018-08-13 LAB — GLUCOSE, CAPILLARY
GLUCOSE-CAPILLARY: 152 mg/dL — AB (ref 70–99)
Glucose-Capillary: 138 mg/dL — ABNORMAL HIGH (ref 70–99)
Glucose-Capillary: 105 mg/dL — ABNORMAL HIGH (ref 70–99)
Glucose-Capillary: 152 mg/dL — ABNORMAL HIGH (ref 70–99)

## 2018-08-13 NOTE — Progress Notes (Signed)
Physical Therapy Session Note  Patient Details  Name: Larry Fowler MRN: [id number] Date of Birth: Sep 20, 1970  Today's Date: 08/13/2018 PT Individual Time: 1300-1400 PT Individual Time Calculation (min): 60 min   Short Term Goals: Week 2:  PT Short Term Goal 1 (Week 2): Pt will propell WC > 112ft with superivsion assist through rehab unit  PT Short Term Goal 2 (Week 2): Pt Will perform SB transfer to [facility] with min assist from mat table  PT Short Term Goal 3 (Week 2): Pt will perform sit<>stand form mat table with mod assist   Skilled Therapeutic Interventions/Progress Updates:   Pt in supine and agreeable to therapy, denies pain at rest. Assisted w/ donning clothing (pt has just finished bathing w/ NT). Donned shorts w/ total assist and shirt w/ supervision. Pt rolling R/L w/ supervision for dressing and placement of maxisky sling. Maxisky transfer to w/c. Mod assist for repositioning in chair for safety. Pt self-propelled w/c to outside of hospital to work on UE strengthening and global endurance training. Pt propelled w/ supervision using BUEs in multiple 150' bouts. Min assist needed to negotiate thresholds of elevator and door to outside and to negotiate uneven brick surface of sidewalk. Pt conversing w/ therapist regarding his prognosis. Pt expressing anxiety over walking again and getting better. Provided encouragement and education on orthopedic deficits and taking it one day at a time. Pt appreciative of spending session outside to improve overall emotional well being. Returned to unit via w/c, ended session in w/c and all needs in reach.   Therapy Documentation Precautions:  Precautions Precautions: Fall Precaution Comments: NO knee flexion Required Braces or Orthoses: Other Brace Knee Immobilizer - Right: On at all times Knee Immobilizer - Left: On at all times Other Brace: Bilateral Bledsoe braces locked in extension Restrictions Weight Bearing Restrictions: Yes RLE Weight  Bearing: Weight bearing as tolerated LLE Weight Bearing: Weight bearing as tolerated  Therapy/Group: Individual Therapy  Tregan Read Melton Krebs 08/13/2018, 3:40 PM

## 2018-08-13 NOTE — Progress Notes (Signed)
PHYSICAL MEDICINE & REHABILITATION PROGRESS NOTE  Subjective/Complaints: Patient seen laying in bed this morning.  He states he slept well overnight.  He denies complaints.  ROS: Denies CP, shortness of breath, nausea, vomiting, diarrhea.  Objective: Vital Signs: Blood pressure 132/62, pulse 65, temperature 98.5 F (36.9 C), resp. rate 19, height 6\' 2"  (1.88 m), weight (!) 226.9 kg, SpO2 100 %. No results found. Recent Labs    08/11/18 0412  WBC 9.3  HGB 9.8*  HCT 32.3*  PLT 215   No results for input(s): NA, K, CL, CO2, GLUCOSE, BUN, CREATININE, CALCIUM in the last 72 hours.  Physical Exam: BP 132/62 (BP Location: Right Arm)   Pulse 65   Temp 98.5 F (36.9 C)   Resp 19   Ht 6\' 2"  (1.88 m)   Wt (!) 226.9 kg   SpO2 100%   BMI 64.22 kg/m  Constitutional: No distress . Vital signs reviewed.  Morbidly obese. HENT: Normocephalic.  Atraumatic. Eyes: EOMI. No discharge. Cardiovascular: RRR.  No JVD. Respiratory: CTA bilaterally.  Normal effort. GI: BS +. Non-distended. Musc: Bilateral lower edema and tenderness  Neurological: He is alert and oriented Motor: Bilateral upper extremities: 5/5 proximal to distal Right lower extremity: Hip flexion 3+/5, Knee extension locked in brace, ankle dorsiflexion 5/5, improving Left lower extremity: Hip flexion 3+/5, knee extension locked in brace, ankle dorsiflexion 5/5, improving Skin: Bilateral Bledsoe braces in place with incisions C/D/I  Psychiatric: He has a normal mood and affect. His behavior is normal  Assessment/Plan: 1. 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  Physiatrist is providing close team supervision and 24 hour management of active medical problems listed below.  Physiatrist and rehab team continue to assess barriers to discharge/monitor patient progress toward  functional and medical goals  Care Tool:  Bathing    Body parts bathed by patient: Right arm, Left arm, Chest, Abdomen, Face, Front perineal area, Right upper leg, Left upper leg(bed level)   Body parts bathed by helper: Right lower leg, Left lower leg, Buttocks Body parts n/a: Left lower leg, Right lower leg(Did not attempt)   Bathing assist Assist Level: Minimal Assistance - Patient > 75%     Upper Body Dressing/Undressing Upper body dressing   What is the patient wearing?: Pull over shirt    Upper body assist Assist Level: Supervision/Verbal cueing    Lower Body Dressing/Undressing Lower body dressing      What is the patient wearing?: Pants     Lower body assist Assist for lower body dressing: Maximal Assistance - Patient 25 - 49%     Toileting Toileting    Toileting assist Assist for toileting: 2 Helpers     Transfers Chair/bed transfer  Transfers assist  Chair/bed transfer activity did not occur: Safety/medical concerns  Chair/bed transfer assist level: Dependent - mechanical lift     Locomotion Ambulation   Ambulation assist   Ambulation activity did not occur: Safety/medical concerns          Walk 10 feet activity   Assist  Walk 10 feet activity did not occur: Safety/medical concerns        Walk 50 feet activity   Assist Walk 50 feet with 2 turns activity did not occur: Safety/medical concerns         Walk 150 feet activity   Assist Walk 150 feet activity did not occur: Safety/medical  concerns         Walk 10 feet on uneven surface  activity   Assist Walk 10 feet on uneven surfaces activity did not occur: Safety/medical concerns         Wheelchair     Assist   Type of Wheelchair: Manual Wheelchair activity did not occur: Safety/medical concerns  Wheelchair assist level: Supervision/Verbal cueing Max wheelchair distance: 148ft    Wheelchair 50 feet with 2 turns activity    Assist    Wheelchair 50  feet with 2 turns activity did not occur: Safety/medical concerns   Assist Level: Supervision/Verbal cueing   Wheelchair 150 feet activity     Assist Wheelchair 150 feet activity did not occur: Safety/medical concerns   Assist Level: Supervision/Verbal cueing      Medical Problem List and Plan: 1.  Decreased functional mobility secondary to bilateral quadricep tendon rupture. Status post repair of bilateral tendon rupture repair 07/21/2018. Weightbearing as tolerated. Bilateral Bledsoe brace locked in extension             Continue CIR 2.  Antithrombotics: -DVT/anticoagulation:  Subcutaneous Lovenox 110 mg daily.   Lower extremity vascular ultrasound limited, but negative             -antiplatelet therapy: Not applicable 3. Pain Management:  Lidoderm patch,OxyContin sustained release 10 mg BID  Wean oxycontin to qhs, DC'd on 3/5  Oxycodone immediate release for breakthrough pain 4. Mood:  Zoloft 25 mg daily. Provide emotional support  Appreciate Neuropsych consult- discussed with provider previously             -antipsychotic agents:  Not applicable 5. Neuropsych: This patient is capable of making decisions on his own behalf. 6. Skin/Wound Care: Stage I pressure ulcer. Follow-up wound care nurse Routine skin checks 7. Fluids/Electrolytes/Nutrition:  Routine interval mouth with follow-up  BMP within acceptable range on 2/26, creatinine within normal limits on 3/3  Labs ordered for tomorrow 8. Acute blood loss anemia. Transfused 1 unit packed red blood cells postoperatively.   Hemoglobin 9.8 on 3/6  Continue to monitor 9.  Super super obesity. Follow-up dietary services 10. New findingsType 2 diabetes mellitus. Hemoglobin A1c 7.1. Glucophage 500 mg daily Check blood sugars before meals and at bedtime. Diabetic teaching  Monitor with increased mobility CBG (last 3)  Recent Labs    08/12/18 2105 08/13/18 0639 08/13/18 1248  GLUCAP 156* 138* 105*   Slightly labile on  3/8 11. Acute otitis media. Augmentin 875-125 mg initiated 07/30/2018. 12. Constipation. Laxative assistance 13. Asthma/OSA/tobacco/ marijuana abuse. CPAP as directed.Continue Symbicort as well as nebulizer treatments. Provide counseling regards to tobacco abuse 14.  Hypoalbuminemia  Supplement initiated on 2/26, DC'd on 3/4 per patient 15.  Labile blood pressure   Vitals:   08/13/18 0540 08/13/18 1508  BP: (!) 124/50 132/62  Pulse: 64 65  Resp: 18 19  Temp: 98.6 F (37 C) 98.5 F (36.9 C)  SpO2: 98% 100%   Relatively controlled on 3/8 16.  Sleep disturbance  Restoril started on 2/27  Improved 17. Sore throat  Chloraseptic spray ordered 18.  Pruritus  Benadryl cream ordered on 3/2  Improved 19.  Congestion in ears  Debrox ordered  Improving  LOS: 12 days A FACE TO FACE EVALUATION WAS PERFORMED  Ankit Karis Juba 08/13/2018, 4:30 PM

## 2018-08-13 NOTE — Progress Notes (Signed)
Patient has home CPAP in room. Patient stated as of now he does not want to wear it tonight, but he would have RN call RT if he changes his mind and needs assistance. RT will monitor as needed.

## 2018-08-14 ENCOUNTER — Inpatient Hospital Stay (HOSPITAL_COMMUNITY): Payer: Self-pay | Admitting: Occupational Therapy

## 2018-08-14 ENCOUNTER — Inpatient Hospital Stay (HOSPITAL_COMMUNITY): Payer: Self-pay

## 2018-08-14 LAB — BASIC METABOLIC PANEL
Anion gap: 11 (ref 5–15)
BUN: 12 mg/dL (ref 6–20)
CO2: 25 mmol/L (ref 22–32)
Calcium: 9.1 mg/dL (ref 8.9–10.3)
Chloride: 101 mmol/L (ref 98–111)
Creatinine, Ser: 0.6 mg/dL — ABNORMAL LOW (ref 0.61–1.24)
GFR calc Af Amer: >60 mL/min (ref >60)
Glucose, Bld: 158 mg/dL — ABNORMAL HIGH (ref 70–99)
POTASSIUM: 3.9 mmol/L (ref 3.5–5.1)
Sodium: 137 mmol/L (ref 135–145)

## 2018-08-14 LAB — GLUCOSE, CAPILLARY
GLUCOSE-CAPILLARY: 131 mg/dL — AB (ref 70–99)
Glucose-Capillary: 147 mg/dL — ABNORMAL HIGH (ref 70–99)
Glucose-Capillary: 162 mg/dL — ABNORMAL HIGH (ref 70–99)
Glucose-Capillary: 119 mg/dL — ABNORMAL HIGH (ref 70–99)

## 2018-08-14 MED ORDER — METFORMIN HCL 850 MG PO TABS
850.0000 mg | ORAL_TABLET | Freq: Every day | ORAL | Status: DC
  Administered 2018-08-15 – 2018-08-30 (×16): 850 mg via ORAL
  Filled 2018-08-14 (×16): qty 1

## 2018-08-14 NOTE — Progress Notes (Signed)
Occupational Therapy Session Note  Patient Details  Name: Larry Fowler MRN: [id number] Date of Birth: 1971-01-03  Today's Date: 08/14/2018 OT Individual Time: 0904-1003 OT Individual Time Calculation (min): 59 min    Short Term Goals: Week 2:  OT Short Term Goal 1 (Week 2): Pt will tolerate sitting EOB for UB selfcare with supervision for 15 mins.   OT Short Term Goal 2 (Week 2): Pt will perform LB bathing supine to sit with mod assist.  OT Short Term Goal 3 (Week 2): Pt will tolerate tilted bed up to 75 degrees for at least 2 mins in preparation for  OT Short Term Goal 4 (Week 2): Pt will use sockaide to donn gripper socks in sitting with setup only.   OT Short Term Goal 5 (Week 2): Pt will perform stand pivot transfer to bariatric 3:1 with total assist +2 (pt 30%).  Skilled Therapeutic Interventions/Progress Updates:    Pt completed bathing and dressing supine to supine with HOB up during session.  He was able to wash his face, apply deodorant, and donn pullover shirt with supervision to start session.  He then washed some of his upper peri area, but needed max assist for thoroughness with cleaning as well as application of powder and creams.  Total assist for washing buttocks and back in sidelying with pt completing rolling side to side with supervision using the rails.  Therapist applied pants over feet and pulled up over his hips with max assist as well.  Ace bandages donned to both knees in order to help the hinged knee braces stay in place better.  Both had slid down when completing bathing and rolling tasks.  Pt finished session in bed with call button and phone in reach and pt working on breakfast.    Therapy Documentation Precautions:  Precautions Precautions: Fall Precaution Comments: NO knee flexion Required Braces or Orthoses: Other Brace Knee Immobilizer - Right: On at all times Knee Immobilizer - Left: On at all times Other Brace: Bilateral Bledsoe braces locked in  extension Restrictions Weight Bearing Restrictions: No RLE Weight Bearing: Weight bearing as tolerated LLE Weight Bearing: Weight bearing as tolerated  Pain: Pain Assessment Pain Scale: Faces Pain Score: 5  Faces Pain Scale: Hurts a little bit Pain Type: Surgical pain Pain Location: Knee Pain Orientation: Right;Left Pain Descriptors / Indicators: Aching Pain Frequency: Intermittent Pain Onset: With Activity Patients Stated Pain Goal: 4 Pain Intervention(s): Repositioned ADL: Therapy/Group: Individual Therapy  Latanja Lehenbauer OTR/L 08/14/2018, 11:32 AM

## 2018-08-14 NOTE — Progress Notes (Signed)
[facility] PHYSICAL MEDICINE & REHABILITATION PROGRESS NOTE  Subjective/Complaints: Patient seen laying in bed this morning.  He states he slept well overnight.    ROS: Denies CP, shortness of breath, nausea, vomiting, diarrhea.  Objective: Vital Signs: Blood pressure (!) 139/52, pulse 66, temperature 99.5 F (37.5 C), temperature source Oral, resp. rate 18, height 6\' 2"  (1.88 m), weight (!) 226.9 kg, SpO2 97 %. No results found. No results for input(s): WBC, HGB, HCT, PLT in the last 72 hours. Recent Labs    08/14/18 0847  NA 137  K 3.9  CL 101  CO2 25  GLUCOSE 158*  BUN 12  CREATININE 0.60*  CALCIUM 9.1    Physical Exam: BP (!) 139/52 (BP Location: Right Arm)   Pulse 66   Temp 99.5 F (37.5 C) (Oral)   Resp 18   Ht 6\' 2"  (1.88 m)   Wt (!) 226.9 kg   SpO2 97%   BMI 64.22 kg/m  Constitutional: No distress . Vital signs reviewed.  Morbidly obese. HENT: Normocephalic.  Atraumatic. Eyes: EOMI. No discharge. Cardiovascular: RRR.  No JVD. Respiratory: CTA bilaterally.  Normal effort. GI: BS +. Non-distended. Musc: Bilateral lower edema and tenderness  Neurological: He is alert and oriented Motor: Bilateral upper extremities: 5/5 proximal to distal Right lower extremity: Hip flexion 3+/5, Knee extension locked in brace, ankle dorsiflexion 5/5, stable Left lower extremity: Hip flexion 3+/5, knee extension locked in brace, ankle dorsiflexion 5/5, stable Skin: Bilateral Bledsoe braces in place with incisions C/D/I  Psychiatric: He has a normal mood and affect. His behavior is normal  Assessment/Plan: 1. 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  Physiatrist is providing close team supervision and 24 hour management of active medical problems listed below.  Physiatrist and rehab team continue to assess barriers to  discharge/monitor patient progress toward functional and medical goals  Care Tool:  Bathing    Body parts bathed by patient: Right arm, Left arm, Chest, Abdomen, Face, Front perineal area, Right upper leg, Left upper leg(bed level)   Body parts bathed by helper: Right lower leg, Left lower leg, Buttocks Body parts n/a: Left lower leg, Right lower leg(Did not attempt)   Bathing assist Assist Level: Minimal Assistance - Patient > 75%     Upper Body Dressing/Undressing Upper body dressing   What is the patient wearing?: Pull over shirt    Upper body assist Assist Level: Supervision/Verbal cueing    Lower Body Dressing/Undressing Lower body dressing      What is the patient wearing?: Pants     Lower body assist Assist for lower body dressing: Maximal Assistance - Patient 25 - 49%     Toileting Toileting    Toileting assist Assist for toileting: 2 Helpers     Transfers Chair/bed transfer  Transfers assist  Chair/bed transfer activity did not occur: Safety/medical concerns  Chair/bed transfer assist level: Dependent - mechanical lift     Locomotion Ambulation   Ambulation assist   Ambulation activity did not occur: Safety/medical concerns          Walk 10 feet activity   Assist  Walk 10 feet activity did not occur: Safety/medical concerns        Walk 50 feet activity   Assist Walk 50 feet with 2 turns activity did not occur: Safety/medical concerns         Walk 150 feet  activity   Assist Walk 150 feet activity did not occur: Safety/medical concerns         Walk 10 feet on uneven surface  activity   Assist Walk 10 feet on uneven surfaces activity did not occur: Safety/medical concerns         Wheelchair     Assist   Type of Wheelchair: Manual Wheelchair activity did not occur: Safety/medical concerns  Wheelchair assist level: Supervision/Verbal cueing Max wheelchair distance: 186ft    Wheelchair 50 feet with 2 turns  activity    Assist    Wheelchair 50 feet with 2 turns activity did not occur: Safety/medical concerns   Assist Level: Supervision/Verbal cueing   Wheelchair 150 feet activity     Assist Wheelchair 150 feet activity did not occur: Safety/medical concerns   Assist Level: Supervision/Verbal cueing      Medical Problem List and Plan: 1.  Decreased functional mobility secondary to bilateral quadricep tendon rupture. Status post repair of bilateral tendon rupture repair 07/21/2018. Weightbearing as tolerated. Bilateral Bledsoe brace locked in extension             Continue CIR 2.  Antithrombotics: -DVT/anticoagulation:  Subcutaneous Lovenox 110 mg daily.   Lower extremity vascular ultrasound limited, but negative             -antiplatelet therapy: Not applicable 3. Pain Management:  Lidoderm patch,OxyContin sustained release 10 mg BID  Wean oxycontin to qhs, DC'd on 3/5  Oxycodone immediate release for breakthrough pain 4. Mood:  Zoloft 25 mg daily. Provide emotional support  Appreciate Neuropsych consult- discussed with provider previously             -antipsychotic agents:  Not applicable 5. Neuropsych: This patient is capable of making decisions on his own behalf. 6. Skin/Wound Care: Stage I pressure ulcer. Follow-up wound care nurse Routine skin checks 7. Fluids/Electrolytes/Nutrition:  Routine interval mouth with follow-up  BMP within acceptable range on 3/9 8. Acute blood loss anemia. Transfused 1 unit packed red blood cells postoperatively.   Hemoglobin 9.8 on 3/6  Continue to monitor 9.  Super super obesity. Follow-up dietary services 10. New findingsType 2 diabetes mellitus. Hemoglobin A1c 7.1.  Check blood sugars before meals and at bedtime. Diabetic teaching  Monitor with increased mobility CBG (last 3)  Recent Labs    08/13/18 1728 08/13/18 2121 08/14/18 0615  GLUCAP 152* 152* 147*   Glucophage 500 mg daily, increased to 850 on 3/10 11. Acute otitis media.  Augmentin 875-125 mg initiated 07/30/2018. 12. Constipation. Laxative assistance 13. Asthma/OSA/tobacco/ marijuana abuse. CPAP as directed.Continue Symbicort as well as nebulizer treatments. Provide counseling regards to tobacco abuse 14.  Hypoalbuminemia  Supplement initiated on 2/26, DC'd on 3/4 per patient 15.  Labile blood pressure   Vitals:   08/13/18 2126 08/14/18 0616  BP: 133/69 (!) 139/52  Pulse: 73 66  Resp: 18 18  Temp: 99.2 F (37.3 C) 99.5 F (37.5 C)  SpO2: 98% 97%   Controlled on 3/8 16.  Sleep disturbance  Restoril started on 2/27  Improved 17. Sore throat  Chloraseptic spray ordered 18.  Pruritus  Benadryl cream ordered on 3/2  Improved 19.  Congestion in ears  Debrox ordered  Improved  LOS: 13 days A FACE TO FACE EVALUATION WAS PERFORMED   Karis Juba 08/14/2018, 11:22 AM

## 2018-08-14 NOTE — Progress Notes (Signed)
Patient refused Larry Fowler states his medication is cheaper, patient requires no assistance with home CPAP at this time, no distress noted.

## 2018-08-14 NOTE — Progress Notes (Signed)
Physical Therapy Session Note  Patient Details  Name: Larry Fowler MRN: [id number] Date of Birth: 1971/04/09  Today's Date: 08/14/2018 PT Individual Time: 1015-1115 and 1300-1400 PT Individual Time Calculation (min): 60 min and 60 min  Short Term Goals: Week 2:  PT Short Term Goal 1 (Week 2): Pt will propell WC > 145f with superivsion assist through rehab unit  PT Short Term Goal 2 (Week 2): [facility]  PT Short Term Goal 3 (Week 2): Pt will perform sit<>stand form mat table with mod assist   Skilled Therapeutic Interventions/Progress Updates: Pt presented in bed expressing urgency for BM. Pt performed rolling L/R with use of bed rails and supervision as PTA doffed shorts and placed Maxi Sky sling. Pt able to control MaxiSky as PTA and tech positioned pt on bari commode (+BM). PTA total A for peri-care and returned to bed in same manner. Pt performed rolling in same manner as prior to don shorts. Pt then transferred back to w[facility]c. [facility]/c mobility on unlevel surfaces. Pt able to propel however fatigues quickly. Pt appreciative of spending time outside. Pt returned to unit and requests to remain in w/c to sit in dayroom. Pt also provided with gloves for improved grip during w/c mobility. Pt left in dayroom with needs met.   Tx2:  Pt presented in w/c completing lunch and agreeable to therapy. Pt transported to rehab gym and participated in STS from elevated mat. Pt was able to perform x 2 STS using self momentum by rocking and thrusting to come to standing position. Pt was able to tolerate standing in heavy duty walker 2-3 minute bouts. Pt also trialed wt shifting laterally in walker with fair tolerance. Pt was also able to scoot posteriorly on mat supervision once completed standing. Pt becoming emotional after standing with PTA providing support. Pt then performed MaxiSky transfer to w/c and pt  propelling partly back to room then tech transporting remaining distance. Pt requesting pain meds due to significantly increased pain during standing, nsg notified. Pt stating prefers sitting in w/c vs bariatric recliner as seat deeper. Pt left in w/c at end of session with lunch tray set up and needs met.      Therapy Documentation Precautions:  Precautions Precautions: Fall Precaution Comments: NO knee flexion Required Braces or Orthoses: Other Brace Knee Immobilizer - Right: On at all times Knee Immobilizer - Left: On at all times Other Brace: Bilateral Bledsoe braces locked in extension Restrictions Weight Bearing Restrictions: No RLE Weight Bearing: Weight bearing as tolerated LLE Weight Bearing: Weight bearing as tolerated General:   Vital Signs:  Pain: Pain Assessment Pain Scale: 0-10 Pain Score: 6  Faces Pain Scale: Hurts a little bit Pain Type: Surgical pain Pain Location: Knee Pain Orientation: Right;Left Pain Onset: With Activity Pain Intervention(s): Medication (See eMAR);Repositioned Mobility:   Locomotion :    Trunk/Postural Assessment :    Balance:   Exercises:   Other Treatments:      Therapy/Group: Individual Therapy  Posey Petrik 08/14/2018, 2:26 PM

## 2018-08-15 ENCOUNTER — Inpatient Hospital Stay (HOSPITAL_COMMUNITY): Payer: Self-pay | Admitting: Occupational Therapy

## 2018-08-15 ENCOUNTER — Inpatient Hospital Stay (HOSPITAL_COMMUNITY): Payer: Self-pay

## 2018-08-15 DIAGNOSIS — J45909 Unspecified asthma, uncomplicated: Secondary | ICD-10-CM

## 2018-08-15 LAB — CBC
HCT: 33 % — ABNORMAL LOW (ref 39.0–52.0)
Hemoglobin: 10.2 g/dL — ABNORMAL LOW (ref 13.0–17.0)
MCH: 30.3 pg (ref 26.0–34.0)
MCHC: 30.9 g/dL (ref 30.0–36.0)
MCV: 97.9 fL (ref 80.0–100.0)
Platelets: 205 10*3/uL (ref 150–400)
RBC: 3.37 MIL/uL — ABNORMAL LOW (ref 4.22–5.81)
RDW: 14.7 % (ref 11.5–15.5)
WBC: 9 10*3/uL (ref 4.0–10.5)
nRBC: 0 % (ref 0.0–0.2)

## 2018-08-15 LAB — CREATININE, SERUM
Creatinine, Ser: 0.66 mg/dL (ref 0.61–1.24)
GFR calc Af Amer: >60 mL/min (ref >60)
GFR calc non Af Amer: >60 mL/min (ref >60)

## 2018-08-15 LAB — GLUCOSE, CAPILLARY
GLUCOSE-CAPILLARY: 143 mg/dL — AB (ref 70–99)
GLUCOSE-CAPILLARY: 167 mg/dL — AB (ref 70–99)
Glucose-Capillary: 147 mg/dL — ABNORMAL HIGH (ref 70–99)
Glucose-Capillary: 163 mg/dL — ABNORMAL HIGH (ref 70–99)

## 2018-08-15 MED ORDER — ENSURE MAX PROTEIN PO LIQD
11.0000 [oz_av] | Freq: Once | ORAL | Status: AC
  Administered 2018-08-16: 11 [oz_av] via ORAL
  Filled 2018-08-15: qty 330

## 2018-08-15 NOTE — Progress Notes (Signed)
Patient has home CPAP at bedside.  RT assistance not needed at this time. 

## 2018-08-15 NOTE — Progress Notes (Signed)
Nutrition Follow-up  DOCUMENTATION CODES:   Morbid obesity  INTERVENTION:   - Continue snacks daily  - Continue double protein portions with all meals  - Continue MVI with minerals daily  - Obtained food preferences and programmed them into [employer] diet software  NUTRITION DIAGNOSIS:   Increased nutrient needs related to wound healing as evidenced by estimated needs.  Ongoing  GOAL:   Patient will meet greater than or equal to 90% of their needs  Progressing  MONITOR:   PO intake, Supplement acceptance, Skin, Labs  REASON FOR ASSESSMENT:   Consult Assessment of nutrition requirement/status, Wound healing  ASSESSMENT:   48 year old male with PMH of OSA-CPAP, tobacco abuse, anxiety/depression, asthma, and morbid obesity. Pt resented 07/20/18 after slipping and falling backwards with acute onset of bilateral knee pain. CT of the knees done revealing bilateral quad tendon rupture. Pt underwent repair on 07/21/18. Findings of elevated hemoglobin A1C of 7.1. Pt admitted to CIR on 2/25.  Spoke with pt at bedside. Pt with ~50% completed breakfast meal tray. Pt states that he really does not like the scrambled eggs. RD to enter this as a dislike in [employer] diet software. Pt with multiple other dislikes.  Pt states he is willing to try one Ensure Max shake to aid in meeting protein needs. RD will order Ensure Max for tomorrow AM once and discuss with pt whether he tolerated it well. Pt reports Glucerna "tore up my stomach."  Meal Completion: 20-100% x last 8 recorded meals (average 70%)  Medications reviewed and include: SSI, Metformin, Protonix, MVI, Senna  Labs reviewed. CBG's: 163, 147, 119, 162 x 24 hours  Diet Order:   Diet Order            Diet Carb Modified Fluid consistency: Thin; Room service appropriate? Yes  Diet effective now              EDUCATION NEEDS:   Education needs have been addressed  Skin:  Skin Assessment: Skin Integrity  Issues: DTI: sacrum Stage II: Right & left buttocks, Left thigh Incisions: L knee, R knee  Last BM:  3/10 small type 4  Height:   Ht Readings from Last 1 Encounters:  08/01/18 6\' 2"  (1.88 m)    Weight:   Wt Readings from Last 1 Encounters:  08/02/18 (!) 226.9 kg    Ideal Body Weight:  86.4 kg  BMI:  Body mass index is 64.22 kg/m.  Estimated Nutritional Needs:   Kcal:  2600-2800  Protein:  120-135 grams  Fluid:  >/= 2.4 L    Earma Reading, MS, RD, LDN Inpatient Clinical Dietitian Pager: [phone] Weekend/After Hours: [phone]

## 2018-08-15 NOTE — Progress Notes (Signed)
[place] PHYSICAL MEDICINE & REHABILITATION PROGRESS NOTE  Subjective/Complaints: Patient seen laying in bed this morning.  He states he slept well overnight.  He states he had a rough day of therapies yesterday.  He requests his Elwin Sleight be DC'd.  He asks again to make sure his kidneys are okay.  ROS: Denies CP, shortness of breath, nausea, vomiting, diarrhea.  Objective: Vital Signs: Blood pressure (!) 132/59, pulse 71, temperature 98.3 F (36.8 C), resp. rate 19, height 6\' 2"  (1.88 m), weight (!) 226.9 kg, SpO2 96 %. No results found. Recent Labs    08/15/18 0436  WBC 9.0  HGB 10.2*  HCT 33.0*  PLT 205   Recent Labs    08/14/18 0847 08/15/18 0436  NA 137  --   K 3.9  --   CL 101  --   CO2 25  --   GLUCOSE 158*  --   BUN 12  --   CREATININE 0.60* 0.66  CALCIUM 9.1  --     Physical Exam: BP (!) 132/59 (BP Location: Left Arm)   Pulse 71   Temp 98.3 F (36.8 C)   Resp 19   Ht 6\' 2"  (1.88 m)   Wt (!) 226.9 kg   SpO2 96%   BMI 64.22 kg/m  Constitutional: No distress . Vital signs reviewed.  Morbidly obese. HENT: Normocephalic.  Atraumatic. Eyes: EOMI. No discharge. Cardiovascular: RRR.  No JVD. Respiratory: CTA bilaterally.  Normal effort. GI: BS +. Non-distended. Musc: Bilateral lower edema and tenderness  Neurological: He is alert and oriented Motor: Bilateral upper extremities: 5/5 proximal to distal Right lower extremity: Hip flexion 3+/5, Knee extension locked in brace, ankle dorsiflexion 5/5, stable Left lower extremity: Hip flexion 3+/5, knee extension locked in brace, ankle dorsiflexion 5/5, able Skin: Bilateral Bledsoe braces in place with incisions C/D/I  Psychiatric: He has a normal mood and affect. His behavior is normal  Assessment/Plan: 1. 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  Physiatrist is  providing close team supervision and 24 hour management of active medical problems listed below.  Physiatrist and rehab team continue to assess barriers to discharge/monitor patient progress toward functional and medical goals  Care Tool:  Bathing    Body parts bathed by patient: Face   Body parts bathed by helper: Front perineal area, Buttocks Body parts n/a: Right arm, Left arm, Chest, Abdomen, Left lower leg, Right lower leg, Left upper leg, Right upper leg(Did not attempt secondary to pt washing last night)   Bathing assist Assist Level: Maximal Assistance - Patient 24 - 49%     Upper Body Dressing/Undressing Upper body dressing   What is the patient wearing?: Pull over shirt    Upper body assist Assist Level: Supervision/Verbal cueing    Lower Body Dressing/Undressing Lower body dressing      What is the patient wearing?: Pants     Lower body assist Assist for lower body dressing: Maximal Assistance - Patient 25 - 49%     Toileting Toileting    Toileting assist Assist for toileting: 2 Helpers     Transfers Chair/bed transfer  Transfers assist  Chair/bed transfer activity did not occur: Safety/medical concerns  Chair/bed transfer assist level: Dependent - mechanical lift     Locomotion Ambulation   Ambulation assist   Ambulation activity did not occur: Safety/medical concerns  Walk 10 feet activity   Assist  Walk 10 feet activity did not occur: Safety/medical concerns        Walk 50 feet activity   Assist Walk 50 feet with 2 turns activity did not occur: Safety/medical concerns         Walk 150 feet activity   Assist Walk 150 feet activity did not occur: Safety/medical concerns         Walk 10 feet on uneven surface  activity   Assist Walk 10 feet on uneven surfaces activity did not occur: Safety/medical concerns         Wheelchair     Assist   Type of Wheelchair: Manual Wheelchair activity did not  occur: Safety/medical concerns  Wheelchair assist level: Supervision/Verbal cueing Max wheelchair distance: 144ft    Wheelchair 50 feet with 2 turns activity    Assist    Wheelchair 50 feet with 2 turns activity did not occur: Safety/medical concerns   Assist Level: Supervision/Verbal cueing   Wheelchair 150 feet activity     Assist Wheelchair 150 feet activity did not occur: Safety/medical concerns   Assist Level: Supervision/Verbal cueing      Medical Problem List and Plan: 1.  Decreased functional mobility secondary to bilateral quadricep tendon rupture. Status post repair of bilateral tendon rupture repair 07/21/2018. Weightbearing as tolerated. Bilateral Bledsoe brace locked in extension             Continue CIR 2.  Antithrombotics: -DVT/anticoagulation:  Subcutaneous Lovenox 110 mg daily.   Lower extremity vascular ultrasound limited, but negative             -antiplatelet therapy: Not applicable 3. Pain Management:  Lidoderm patch,OxyContin sustained release 10 mg BID  Wean oxycontin to qhs, DC'd on 3/5  Oxycodone immediate release for breakthrough pain 4. Mood:  Zoloft 25 mg daily. Provide emotional support  Appreciate Neuropsych consult- discussed with provider previously             -antipsychotic agents:  Not applicable 5. Neuropsych: This patient is capable of making decisions on his own behalf. 6. Skin/Wound Care: Stage I pressure ulcer. Follow-up wound care nurse Routine skin checks 7. Fluids/Electrolytes/Nutrition:  Routine interval mouth with follow-up  BMP within acceptable range on 3/9 8. Acute blood loss anemia. Transfused 1 unit packed red blood cells postoperatively.   Hemoglobin 10.2 on 3/10  Continue to monitor 9.  Super super obesity. Follow-up dietary services 10. New findingsType 2 diabetes mellitus. Hemoglobin A1c 7.1.  Check blood sugars before meals and at bedtime. Diabetic teaching  Monitor with increased mobility CBG (last 3)   Recent Labs    08/14/18 1754 08/14/18 2109 08/15/18 0618  GLUCAP 162* 119* 147*   Glucophage 500 mg daily, increased to 850 on 3/10  Slightly labile on 3/10 11. Acute otitis media. Augmentin 875-125 mg initiated 07/30/2018. 12. Constipation. Laxative assistance 13. Asthma/OSA/tobacco/ marijuana abuse. CPAP as directed.Continue Symbicort as well as nebulizer treatments. Provide counseling regards to tobacco abuse  Dulera DC'd per patient on 3/10 14.  Hypoalbuminemia  Supplement initiated on 2/26, DC'd on 3/4 per patient 15.  Labile blood pressure   Vitals:   08/14/18 1939 08/15/18 0356  BP: 136/68 (!) 132/59  Pulse: 60 71  Resp: 18 19  Temp: 98.8 F (37.1 C) 98.3 F (36.8 C)  SpO2: 100% 96%   Labile, but overall controlled on 3/10 16.  Sleep disturbance  Restoril started on 2/27  Improved 17. Sore throat  Chloraseptic spray ordered  18.  Pruritus  Benadryl cream ordered on 3/2  Improved 19.  Congestion in ears  Debrox ordered  Improved  LOS: 14 days A FACE TO FACE EVALUATION WAS PERFORMED  Joee Iovine Karis Jubanil Cleatis Fandrich 08/15/2018, 10:21 AM

## 2018-08-15 NOTE — Progress Notes (Signed)
Occupational Therapy Session Note  Patient Details  Name: Larry Fowler MRN: [id number] Date of Birth: [birth year]  Today's Date: 08/15/2018 OT Individual Time: 1300-1345 OT Individual Time Calculation (min): 45 min    Short Term Goals: Week 2:  OT Short Term Goal 1 (Week 2): Pt will tolerate sitting EOB for UB selfcare with supervision for 15 mins.   OT Short Term Goal 2 (Week 2): Pt will perform LB bathing supine to sit with mod assist.  OT Short Term Goal 3 (Week 2): Pt will tolerate tilted bed up to 75 degrees for at least 2 mins in preparation for  OT Short Term Goal 4 (Week 2): Pt will use sockaide to donn gripper socks in sitting with setup only.   OT Short Term Goal 5 (Week 2): Pt will perform stand pivot transfer to bariatric 3:1 with total assist +2 (pt 30%).  Skilled Therapeutic Interventions/Progress Updates:    OT intervention with focus on bed mobility, directing care for repositioning BLE braces and transfer with max-sky to w/c, demonstrating BUE therex, and safety awareness to increase independence with BADLs.  Pt independent with directing care appropriately and operating maxi-sky.  Pt engaged in BUE therex with theraband level 4 for following: triceps, biceps curls, scapula retraction, biceps diagonals, and rowing.  Pt also engaged in rowing with 8# bar.  All exercises with 20 reps X 2. Pt remained in w/c with all needs within reach.  Therapy Documentation Precautions:  Precautions Precautions: Fall Precaution Comments: NO knee flexion Required Braces or Orthoses: Other Brace Knee Immobilizer - Right: On at all times Knee Immobilizer - Left: On at all times Other Brace: Bilateral Bledsoe braces locked in extension Restrictions Weight Bearing Restrictions: Yes RLE Weight Bearing: Weight bearing as tolerated LLE Weight Bearing: Weight bearing as tolerated  Pain:  Pt denies pain this afternoon   Therapy/Group: Individual Therapy  Rich Brave 08/15/2018, 1:48 PM

## 2018-08-15 NOTE — Progress Notes (Signed)
Occupational Therapy Session Note  Patient Details  Name: Larry Fowler MRN: [id number] Date of Birth: [birth year]  Today's Date: 08/15/2018 OT Individual Time: 0902-1002 OT Individual Time Calculation (min): 60 min    Short Term Goals: Week 2:  OT Short Term Goal 1 (Week 2): Pt will tolerate sitting EOB for UB selfcare with supervision for 15 mins.   OT Short Term Goal 2 (Week 2): Pt will perform LB bathing supine to sit with mod assist.  OT Short Term Goal 3 (Week 2): Pt will tolerate tilted bed up to 75 degrees for at least 2 mins in preparation for  OT Short Term Goal 4 (Week 2): Pt will use sockaide to donn gripper socks in sitting with setup only.   OT Short Term Goal 5 (Week 2): Pt will perform stand pivot transfer to bariatric 3:1 with total assist +2 (pt 30%).  Skilled Therapeutic Interventions/Progress Updates:    Pt completed bathing and dressing during session from supine position.  LH sponge used for washing the upper and lower legs except for feet as well as the peri area with setup.  He did complete all UB bathing in supine with HOB elevated at supervision level as well.  Supervision for rolling right to left with use of the rail.  He donned pullover shirt with supervision and then utilized maxisky for transfer to the bariatric toilet with total assist.  Pt is able to control moving of the maxisky using hand controls while LEs are supported by therapist.  Pt left with call button and phone in reach sitting on the toilet.  Nursing made aware.    Therapy Documentation Precautions:  Precautions Precautions: Fall Precaution Comments: NO knee flexion Required Braces or Orthoses: Other Brace Knee Immobilizer - Right: On at all times Knee Immobilizer - Left: On at all times Other Brace: Bilateral Bledsoe braces locked in extension Restrictions Weight Bearing Restrictions: Yes RLE Weight Bearing: Weight bearing as tolerated LLE Weight Bearing: Weight bearing as  tolerated  Pain: Pain Assessment Pain Scale: Faces Pain Score: 0-No pain Pain Type: Surgical pain Pain Location: Knee Pain Orientation: Right;Left Pain Descriptors / Indicators: Aching Pain Onset: On-going Pain Intervention(s): Repositioned ADL: See Care Tool Section for some details  Therapy/Group: Individual Therapy  Hillard Goodwine OTR/L 08/15/2018, 12:52 PM

## 2018-08-15 NOTE — Progress Notes (Signed)
Physical Therapy Session Note  Patient Details  Name: Larry Fowler MRN: [id number] Date of Birth: 06/04/1971  Today's Date: 08/15/2018 PT Individual Time: 1100-1200 PT Individual Time Calculation (min): 60 min   Short Term Goals: Week 2:  PT Short Term Goal 1 (Week 2): Pt will propell WC > 159f with superivsion assist through rehab unit  PT Short Term Goal 2 (Week 2): [facility]  PT Short Term Goal 3 (Week 2): Pt will perform sit<>stand form mat table with mod assist   Skilled Therapeutic Interventions/Progress Updates: Pt presented in bed agreeable to therapy and pre-medicated. PTA donned pants total A and pt able to perform rolling L/R with supervision and use of bed rails to assist with donning of shorts. PTA also re-adjusted braces for improved fit and donned socks/shoes total A. Pt then transferred to w/c via maxiSky and pt able to propel to rehab gym. Pt then transferred to mat via MaxiSky and changed to standing sling. Pt performed STS from significantly elevated height minA/CGA. Pt was able to maintain standing and initiate pre-gait activities advancing LE and performing wt shifting. Pt with increased pain up to 8/10 in standing. Pt able to return to sitting CGA. Pt performed second trial in same manner and able to take x 2 small steps in parallel bars forward/backwards. PTA and tech able to exchange MaxiSky slings while pt sitting EOM and pt transferred to w/c in same manner as prior. Pt then transported to day room and left with needs met.      Therapy Documentation Precautions:  Precautions Precautions: Fall Precaution Comments: NO knee flexion Required Braces or Orthoses: Other Brace Knee Immobilizer - Right: On at all times Knee Immobilizer - Left: On at all times Other Brace: Bilateral Bledsoe braces locked in extension Restrictions Weight Bearing Restrictions: Yes RLE Weight Bearing: Weight bearing as  tolerated LLE Weight Bearing: Weight bearing as tolerated   Therapy/Group: Individual Therapy  Raylee Strehl  Cresta Riden, PTA  08/15/2018, 3:02 PM

## 2018-08-15 NOTE — Progress Notes (Signed)
Occupational Therapy Session Note  Patient Details  Name: Larry Fowler MRN: [id number] Date of Birth: 04-15-1971  Today's Date: 08/15/2018 OT Individual Time: 0964-3838 OT Individual Time Calculation (min): 72 min    Short Term Goals: Week 2:  OT Short Term Goal 1 (Week 2): Pt will tolerate sitting EOB for UB selfcare with supervision for 15 mins.   OT Short Term Goal 2 (Week 2): Pt will perform LB bathing supine to sit with mod assist.  OT Short Term Goal 3 (Week 2): Pt will tolerate tilted bed up to 75 degrees for at least 2 mins in preparation for  OT Short Term Goal 4 (Week 2): Pt will use sockaide to donn gripper socks in sitting with setup only.   OT Short Term Goal 5 (Week 2): Pt will perform stand pivot transfer to bariatric 3:1 with total assist +2 (pt 30%).  Skilled Therapeutic Interventions/Progress Updates:    Pt worked on sit to stand from elevated mat in the therapy gym.  He was transferred from the wheelchair to the mat with total assist using the lift for support.  Once on the mat the bariatric walking sling was donned for safety and the mat was elevated for increased sit to stand.  He was able to complete sit to stand X 2 with total assist (pt 50%) from elevated mat.  He was only able to maintain standing for 30 seconds to 45 seconds secondary to bilateral knee pain.  He did take one step backwards toward the mat with total assist + 2 (pt 50%).  Pt unable to tolerate more secondary to LE pain.  Returned pt to the wheelchair via [place] and took pt to the dayroom where he requested to sit.  Recommended pt look at therapy session tomorrow and ask for meds 1 hr prior to session if possible.   Therapy Documentation Precautions:  Precautions Precautions: Fall Precaution Comments: NO knee flexion Required Braces or Orthoses: Other Brace Knee Immobilizer - Right: On at all times Knee Immobilizer - Left: On at all times Other Brace: Bilateral Bledsoe braces locked in  extension Restrictions Weight Bearing Restrictions: Yes RLE Weight Bearing: Weight bearing as tolerated LLE Weight Bearing: Weight bearing as tolerated  Pain: Pain Assessment Pain Scale: 0-10 Pain Score: 10-Worst pain ever Pain Type: Surgical pain Pain Location: Knee Pain Orientation: Right;Left Pain Descriptors / Indicators: Aching Pain Onset: With Activity Pain Intervention(s): Repositioned ADL: See Care Tool Section for some details of ADL  Therapy/Group: Individual Therapy  Verma Grothaus OTR/L 08/15/2018, 3:38 PM

## 2018-08-16 ENCOUNTER — Inpatient Hospital Stay (HOSPITAL_COMMUNITY): Payer: Self-pay

## 2018-08-16 ENCOUNTER — Inpatient Hospital Stay (HOSPITAL_COMMUNITY): Payer: Self-pay | Admitting: Physical Therapy

## 2018-08-16 ENCOUNTER — Inpatient Hospital Stay (HOSPITAL_COMMUNITY): Payer: Self-pay | Admitting: Occupational Therapy

## 2018-08-16 LAB — GLUCOSE, CAPILLARY
GLUCOSE-CAPILLARY: 112 mg/dL — AB (ref 70–99)
Glucose-Capillary: 151 mg/dL — ABNORMAL HIGH (ref 70–99)
Glucose-Capillary: 115 mg/dL — ABNORMAL HIGH (ref 70–99)
Glucose-Capillary: 137 mg/dL — ABNORMAL HIGH (ref 70–99)

## 2018-08-16 MED ORDER — TRAZODONE HCL 50 MG PO TABS: 50.0000 mg | ORAL_TABLET | Freq: Every evening | ORAL | Status: DC | PRN

## 2018-08-16 MED ORDER — TRAZODONE HCL 50 MG PO TABS
50.0000 mg | ORAL_TABLET | Freq: Every day | ORAL | Status: DC
  Administered 2018-08-16 – 2018-08-20 (×5): 50 mg via ORAL
  Filled 2018-08-16 (×9): qty 1

## 2018-08-16 MED ORDER — DIPHENHYDRAMINE HCL 12.5 MG/5ML PO ELIX
6.2500 mg | ORAL_SOLUTION | Freq: Every day | ORAL | Status: DC | PRN
  Administered 2018-08-17: 6.25 mg via ORAL
  Filled 2018-08-16: qty 10

## 2018-08-16 MED FILL — VENTOLIN HFA 90 MCG INHALER: 108 (90 BAS | 25 days supply | Qty: 18 | Fill #4 | Status: TO

## 2018-08-16 MED FILL — SYMBICORT 80-4.5 MCG INH: 80-4.5 | 30 days supply | Qty: 10 | Fill #5

## 2018-08-16 NOTE — Progress Notes (Signed)
Physical Therapy Session Note  Patient Details  Name: Larry Fowler MRN: [id number] Date of Birth: [birth year]  Today's Date: 08/16/2018 PT Individual Time: 0932-3557 PT Individual Time Calculation (min): 30 min   Short Term Goals: Week 1:  PT Short Term Goal 1 (Week 1): Pt will perform sit<>supine with mod assist  PT Short Term Goal 1 - Progress (Week 1): Progressing toward goal PT Short Term Goal 2 (Week 1): Pt will remain standing x 5 minutes on TIS bed.  PT Short Term Goal 2 - Progress (Week 1): Met PT Short Term Goal 3 (Week 1): Pt will perform sit<>stand from elevated surface with max + 2 assist   PT Short Term Goal 3 - Progress (Week 1): Progressing toward goal Week 2:  PT Short Term Goal 1 (Week 2): Pt will propell WC > 167f with superivsion assist through rehab unit  PT Short Term Goal 2 (Week 2): [facility]  PT Short Term Goal 3 (Week 2): Pt will perform sit<>stand form mat table with mod assist   Skilled Therapeutic Interventions/Progress Updates:   Pt received supine in bed and agreeable to PT at bed level. PT instructed pt in UE therex to improve safety with mobility using RW at d/c. tricep extension, mid row with level 4 tband, chest press, shoulder press, lat pull down. Each completed 2 x 15-20 for improved muscle endurance. Min cues for decreased speed and full from PT. Pt left supine in bed with call bell in reach and all needs met.       Therapy Documentation Precautions:  Precautions Precautions: Fall Precaution Comments: NO knee flexion Required Braces or Orthoses: Other Brace Knee Immobilizer - Right: On at all times Knee Immobilizer - Left: On at all times Other Brace: Bilateral Bledsoe braces locked in extension Restrictions Weight Bearing Restrictions: No RLE Weight Bearing: Weight bearing as tolerated LLE Weight Bearing: Weight bearing as tolerated Vital Signs: Therapy Vitals Temp: 98.7 F  (37.1 C) Temp Source: Oral Pulse Rate: 70 Resp: 18 BP: 121/74 Patient Position (if appropriate): Sitting Oxygen Therapy SpO2: 98 % O2 Device: Room Air Pain: Pain Assessment Pain Scale: 0-10 Pain Score: 7  Pain Type: Surgical pain Pain Location: Knee Pain Orientation: Right;Left Pain Descriptors / Indicators: Aching Pain Frequency: Constant Pain Onset: On-going Pain Intervention(s): Medication (See eMAR)    Therapy/Group: Individual Therapy  ALorie Phenix3/04/2019, 11:43 PM

## 2018-08-16 NOTE — Progress Notes (Signed)
Physical Therapy Session Note  Patient Details  Name: Larry Fowler MRN: [id number] Date of Birth: 12/22/1970  Today's Date: 08/16/2018 PT Individual Time: 1100-1200 PT Individual Time Calculation (min): 60 min   Short Term Goals: Week 2:  PT Short Term Goal 1 (Week 2): Pt will propell WC > 136f with superivsion assist through rehab unit  PT Short Term Goal 2 (Week 2): Pt Will perform SB [facility]  PT Short Term Goal 3 (Week 2): Pt will perform sit<>stand form mat table with mod assist   Skilled Therapeutic Interventions/Progress Updates: Pt presented in w/c agreeable to therapy. Pt c/o pain B knees nsg notified and pain meds administered at beginning of session. Pt propelled to rehab gym supervision and performed depending transfer to mat. [facility]). Discussed with pt bed height as indicated bed 30in.. Once set up practiced STS from 27in height mat. Pt was then able to take x 6 steps bilaterally forwards/backwards with heavy duty bari walker. Pt indicated only some increase in pain. Pt was able to perform second trial with same set up and ambulate x 7 steps. Pt was then able to scoot backwards when mat was lowered to change slings. Pt then transferred back to w/c and pt propelled to nsg station and transferred remaining distance to dayroom. Pt left in w/c with needs met and appreciative of today's efforts.      Therapy Documentation Precautions:  Precautions Precautions: Fall Precaution Comments: NO knee flexion Required Braces or Orthoses: Other Brace Knee Immobilizer - Right: On at all times Knee Immobilizer - Left: On at all times Other Brace: Bilateral Bledsoe braces locked in extension Restrictions Weight Bearing Restrictions: No RLE Weight Bearing: Weight bearing as tolerated LLE Weight Bearing: Weight bearing as tolerated General:   Vital Signs: Therapy Vitals Temp: 98.2 F (36.8  C) Pulse Rate: 68 Resp: 20 BP: (!) 110/59 Patient Position (if appropriate): Sitting Oxygen Therapy SpO2: 100 % O2 Device: Room Air    Therapy/Group: Individual Therapy  Derya Dettmann  Yvette Loveless, PTA  08/16/2018, 4:10 PM

## 2018-08-16 NOTE — Progress Notes (Signed)
Patient refused CPAP.

## 2018-08-16 NOTE — Progress Notes (Signed)
Physical Therapy Session Note  Patient Details  Name: Larry Fowler MRN: [id number] Date of Birth: 12-11-1970  Today's Date: 08/16/2018 PT Individual Time: 0830-0930 PT Individual Time Calculation (min): 60 min   Short Term Goals: Week 2:  PT Short Term Goal 1 (Week 2): Pt will propell WC > 121ft with superivsion assist through rehab unit  PT Short Term Goal 2 (Week 2): Pt Will perform SB transfer to [facility] with min assist from mat table  PT Short Term Goal 3 (Week 2): Pt will perform sit<>stand form mat table with mod assist   Skilled Therapeutic Interventions/Progress Updates:    Patient in supine reports needs help for dressing as thought OT first.  Assist to wrap legs for skin protection and reposition braces.  Patient donned shirt S and max assist for LB dressing, able to roll with S for pulling up pants and placing lift pad.  Patient lifted via maxisky from lift bed to w/c.  Propelled in w/c to therapy gym with S.  Attempted slide board transfer after w/c set up, but unable to place board and assist with transfer while holding legs without +2 A.  Encouraged to try A-P transfer for variety of options, able to transfer to mat via A-P transfer with minguard once legs placed on mat.  Patient propelled in w/c to room and left with needs in reach.   Therapy Documentation Precautions:  Precautions Precautions: Fall Precaution Comments: NO knee flexion Required Braces or Orthoses: Other Brace Knee Immobilizer - Right: On at all times Knee Immobilizer - Left: On at all times Other Brace: Bilateral Bledsoe braces locked in extension Restrictions Weight Bearing Restrictions: Yes RLE Weight Bearing: Weight bearing as tolerated LLE Weight Bearing: Weight bearing as tolerated Pain: Pain Assessment Pain Scale: 0-10 Pain Score: 7  Pain Type: Surgical pain Pain Location: Knee Pain Orientation: Right;Left Pain Descriptors / Indicators: Aching Pain Frequency: Constant Pain Onset:  On-going Pain Intervention(s): Repositioned    Therapy/Group: Individual Therapy  Elray Mcgregor  Sheran Lawless, PT 08/16/2018, 11:30 AM

## 2018-08-16 NOTE — Progress Notes (Signed)
[facility] PHYSICAL MEDICINE & REHABILITATION PROGRESS NOTE  Subjective/Complaints: Patient seen laying in bed this morning.  He states he slept fairly overnight.  He states the Restoril only works for 30 minutes and the trazodone makes it difficult for him to wake up in the morning.  He also states that topical Benadryl does not do anything for him and asks for liquid Benadryl.  He asks again about the functioning of his kidneys.  ROS: Denies CP, shortness of breath, nausea, vomiting, diarrhea.  Objective: Vital Signs: Blood pressure 116/60, pulse 77, temperature 98 F (36.7 C), temperature source Oral, resp. rate 18, height 6\' 2"  (1.88 m), weight (!) 226.9 kg, SpO2 100 %. No results found. Recent Labs    08/15/18 0436  WBC 9.0  HGB 10.2*  HCT 33.0*  PLT 205   Recent Labs    08/14/18 0847 08/15/18 0436  NA 137  --   K 3.9  --   CL 101  --   CO2 25  --   GLUCOSE 158*  --   BUN 12  --   CREATININE 0.60* 0.66  CALCIUM 9.1  --     Physical Exam: BP 116/60 (BP Location: Right Wrist)   Pulse 77   Temp 98 F (36.7 C) (Oral)   Resp 18   Ht 6\' 2"  (1.88 m)   Wt (!) 226.9 kg   SpO2 100%   BMI 64.22 kg/m  Constitutional: No distress . Vital signs reviewed.  Morbidly obese. HENT: Normocephalic.  Atraumatic. Eyes: EOMI. No discharge. Cardiovascular: RRR.  No JVD. Respiratory: CTA bilaterally.  Normal effort. GI: BS +. Non-distended. Musc: Bilateral lower edema and tenderness  Neurological: He is alert and oriented Motor: Bilateral upper extremities: 5/5 proximal to distal Right lower extremity: Hip flexion 3+/5, Knee extension locked in brace, ankle dorsiflexion 5/5, unchanged Left lower extremity: Hip flexion 3+/5, knee extension locked in brace, ankle dorsiflexion 5/5, unchanged Skin: Bilateral Bledsoe braces in place with incisions C/D/I  Psychiatric: He has a normal mood and affect. His behavior is normal  Assessment/Plan: 1. 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  Physiatrist is providing close team supervision and 24 hour management of active medical problems listed below.  Physiatrist and rehab team continue to assess barriers to discharge/monitor patient progress toward functional and medical goals  Care Tool:  Bathing    Body parts bathed by patient: Face, Right arm, Left arm, Abdomen, Chest, Left upper leg, Right upper leg   Body parts bathed by helper: Front perineal area, Buttocks, Right lower leg, Left lower leg Body parts n/a: Right arm, Left arm, Chest, Abdomen, Left lower leg, Right lower leg, Left upper leg, Right upper leg(Did not attempt secondary to pt washing last night)   Bathing assist Assist Level: Moderate Assistance - Patient 50 - 74%     Upper Body Dressing/Undressing Upper body dressing   What is the patient wearing?: Pull over shirt    Upper body assist Assist Level: Supervision/Verbal cueing    Lower Body Dressing/Undressing Lower body dressing      What is the patient wearing?: Pants     Lower body assist Assist for lower body dressing: Maximal Assistance - Patient 25 - 49%     Toileting Toileting    Toileting assist Assist for toileting: 2 Helpers     Transfers Chair/bed transfer  Transfers assist  Chair/bed  transfer activity did not occur: Safety/medical concerns  Chair/bed transfer assist level: Dependent - mechanical lift     Locomotion Ambulation   Ambulation assist   Ambulation activity did not occur: Safety/medical concerns          Walk 10 feet activity   Assist  Walk 10 feet activity did not occur: Safety/medical concerns        Walk 50 feet activity   Assist Walk 50 feet with 2 turns activity did not occur: Safety/medical concerns         Walk 150 feet activity   Assist Walk 150 feet activity did not occur:  Safety/medical concerns         Walk 10 feet on uneven surface  activity   Assist Walk 10 feet on uneven surfaces activity did not occur: Safety/medical concerns         Wheelchair     Assist   Type of Wheelchair: Manual Wheelchair activity did not occur: Safety/medical concerns  Wheelchair assist level: Supervision/Verbal cueing Max wheelchair distance: 170ft    Wheelchair 50 feet with 2 turns activity    Assist    Wheelchair 50 feet with 2 turns activity did not occur: Safety/medical concerns   Assist Level: Supervision/Verbal cueing   Wheelchair 150 feet activity     Assist Wheelchair 150 feet activity did not occur: Safety/medical concerns   Assist Level: Supervision/Verbal cueing      Medical Problem List and Plan: 1.  Decreased functional mobility secondary to bilateral quadricep tendon rupture. Status post repair of bilateral tendon rupture repair 07/21/2018. Weightbearing as tolerated. Bilateral Bledsoe brace locked in extension             Continue CIR  Team conference today to discuss current and goals and coordination of care, home and environmental barriers, and discharge planning with nursing, case manager, and therapies.  2.  Antithrombotics: -DVT/anticoagulation:  Subcutaneous Lovenox 110 mg daily.   Lower extremity vascular ultrasound limited, but negative             -antiplatelet therapy: Not applicable 3. Pain Management:  Lidoderm patch,OxyContin sustained release 10 mg BID  Wean oxycontin to qhs, DC'd on 3/5  Oxycodone immediate release for breakthrough pain 4. Mood:  Zoloft 25 mg daily. Provide emotional support  Appreciate Neuropsych consult- discussed with provider previously             -antipsychotic agents:  Not applicable 5. Neuropsych: This patient is capable of making decisions on his own behalf. 6. Skin/Wound Care: Stage I pressure ulcer. Follow-up wound care nurse Routine skin checks 7. Fluids/Electrolytes/Nutrition:   Routine interval mouth with follow-up  BMP within acceptable range on 3/9 8. Acute blood loss anemia. Transfused 1 unit packed red blood cells postoperatively.   Hemoglobin 10.2 on 3/10  Continue to monitor 9.  Super super obesity. Follow-up dietary services 10. New findingsType 2 diabetes mellitus. Hemoglobin A1c 7.1.  Check blood sugars before meals and at bedtime. Diabetic teaching  Monitor with increased mobility CBG (last 3)  Recent Labs    08/15/18 1724 08/15/18 2131 08/16/18 0632  GLUCAP 143* 167* 151*   Glucophage 500 mg daily, increased to 850 on 3/10  Remains elevated on 3/11 11. Acute otitis media. Augmentin 875-125 mg initiated 07/30/2018. 12. Constipation. Laxative assistance 13. Asthma/OSA/tobacco/ marijuana abuse. CPAP as directed.Continue Symbicort as well as nebulizer treatments. Provide counseling regards to tobacco abuse  Dulera DC'd per patient on 3/10 14.  Hypoalbuminemia  Supplement initiated on 2/26, DC'd  on 3/4 per patient 15.  Labile blood pressure   Vitals:   08/15/18 2217 08/16/18 0408  BP: 135/63 116/60  Pulse: 68 77  Resp: 18 18  Temp: 98.2 F (36.8 C) 98 F (36.7 C)  SpO2: 100% 100%   Controlled on 3/11 16.  Sleep disturbance  Restoril started on 2/27  Trazodone decreased to 50 on 3/11  Improved 17. Sore throat  Chloraseptic spray ordered 18.  Pruritus  Benadryl cream ordered on 3/2, now patient states ineffective  Oral Benadryl PRN ordered  Improved 19.  Congestion in ears  Debrox ordered  Improved  LOS: 15 days A FACE TO FACE EVALUATION WAS PERFORMED  Larry Fowler 08/16/2018, 10:20 AM

## 2018-08-16 NOTE — Progress Notes (Signed)
Occupational Therapy Session Note  Patient Details  Name: Larry Fowler MRN: [id number] Date of Birth: [birth year]  Today's Date: 08/16/2018 OT Individual Time: 1001-1101 OT Individual Time Calculation (min): 60 min    Short Term Goals: Week 2:  OT Short Term Goal 1 (Week 2): Pt will tolerate sitting EOB for UB selfcare with supervision for 15 mins.   OT Short Term Goal 2 (Week 2): Pt will perform LB bathing supine to sit with mod assist.  OT Short Term Goal 3 (Week 2): Pt will tolerate tilted bed up to 75 degrees for at least 2 mins in preparation for  OT Short Term Goal 4 (Week 2): Pt will use sockaide to donn gripper socks in sitting with setup only.   OT Short Term Goal 5 (Week 2): Pt will perform stand pivot transfer to bariatric 3:1 with total assist +2 (pt 30%).  Skilled Therapeutic Interventions/Progress Updates:    Pt up in the wheelchair to begin session.  He requested to work on oral hygiene as well as UB bathing while sitting up in the wheelchair.  He was able to complete UB bathing, dressing, and oral care all with setup help only.  He also voiced the need to toilet, so used maxisky for transfer back to the bed with +2 assistance for use of the controls and holding his LEs.  He rolled side to side with supervision, requiring max assist for removal of his shorts.  Maxi sky used again for transfer to bariatric toilet with +2 assist.  He was unsuccessful with toileting attempt and returned back to the bed via lift.  Total assist for donning shorts over feet and hips with transfer back to the wheelchair to complete session.  Pt left with call button and phone in reach.    Therapy Documentation Precautions:  Precautions Precautions: Fall Precaution Comments: NO knee flexion Required Braces or Orthoses: Other Brace Knee Immobilizer - Right: On at all times Knee Immobilizer - Left: On at all times Other Brace: Bilateral Bledsoe braces locked in extension Restrictions Weight  Bearing Restrictions: No RLE Weight Bearing: Weight bearing as tolerated LLE Weight Bearing: Weight bearing as tolerated Pain: Pain Assessment Pain Scale: Faces Pain Score: 7  Faces Pain Scale: Hurts a little bit Pain Type: Surgical pain Pain Location: Knee Pain Orientation: Right;Left Pain Descriptors / Indicators: Aching Pain Onset: On-going Pain Intervention(s): Repositioned Multiple Pain Sites: No ADL: See Care Tool Section from some ADL details  Therapy/Group: Individual Therapy  Rexton Greulich OTR/L 08/16/2018, 12:53 PM

## 2018-08-17 ENCOUNTER — Inpatient Hospital Stay (HOSPITAL_COMMUNITY): Payer: Self-pay | Admitting: Occupational Therapy

## 2018-08-17 ENCOUNTER — Inpatient Hospital Stay (HOSPITAL_COMMUNITY): Payer: Self-pay | Admitting: Physical Therapy

## 2018-08-17 LAB — GLUCOSE, CAPILLARY
Glucose-Capillary: 131 mg/dL — ABNORMAL HIGH (ref 70–99)
Glucose-Capillary: 121 mg/dL — ABNORMAL HIGH (ref 70–99)
Glucose-Capillary: 140 mg/dL — ABNORMAL HIGH (ref 70–99)
Glucose-Capillary: 151 mg/dL — ABNORMAL HIGH (ref 70–99)

## 2018-08-17 NOTE — Progress Notes (Signed)
Occupational Therapy Session Note  Patient Details  Name: Larry Fowler MRN: [id number] Date of Birth: 23-Feb-1971  Today's Date: 08/17/2018 OT Individual Time: 1300-1359 OT Individual Time Calculation (min): 59 min    Short Term Goals: Week 2:  OT Short Term Goal 1 (Week 2): Pt will tolerate sitting EOB for UB selfcare with supervision for 15 mins.   OT Short Term Goal 2 (Week 2): Pt will perform LB bathing supine to sit with mod assist.  OT Short Term Goal 3 (Week 2): Pt will tolerate tilted bed up to 75 degrees for at least 2 mins in preparation for  OT Short Term Goal 4 (Week 2): Pt will use sockaide to donn gripper socks in sitting with setup only.   OT Short Term Goal 5 (Week 2): Pt will perform stand pivot transfer to bariatric 3:1 with total assist +2 (pt 30%).  Skilled Therapeutic Interventions/Progress Updates:    Pt completed toileting to start session with total assist.  He was already on the wide bariatric 3:1 to start session.  He was able to complete transfer back to the bed for peri cleaning and donning his shorts, both with total assist as well.  Used the maxi sky for transfer back to the wheelchair with therapist supporting LEs and pt operating the lift.  He then rolled himself down to the therapy gym with supervision where he completed transfer to the therapy mat with use of the Maxi sky.  Worked on sit to stand and functional mobility from the elevated therapy mat with use of the bariatric RW.  Applied the bariatric walking sling for safety.  He was able to complete sit to stand with min assist while therapist and tech stabilized walker total +2 (pt 70%).  He was then able to ambulate over 17 ft with use of the RW and min assist.  He was able to complete sit to stand at the same level two more times, including taking two steps forward and backwards with use of the RW at the same level.  Next, had pt transfer from the EOM back to the wheelchair with total assist using the  lift.  Finished session with call button and phone in reach and pt in the wheelchair at bed side.    Therapy Documentation Precautions:  Precautions Precautions: Fall Precaution Comments: NO knee flexion Required Braces or Orthoses: Other Brace Knee Immobilizer - Right: On at all times Knee Immobilizer - Left: On at all times Other Brace: Bilateral Bledsoe braces locked in extension Restrictions Weight Bearing Restrictions: No RLE Weight Bearing: Weight bearing as tolerated LLE Weight Bearing: Weight bearing as tolerated   Vital Signs: Therapy Vitals Temp: 98 F (36.7 C) Pulse Rate: 71 Resp: 18 BP: 117/61 Patient Position (if appropriate): Sitting Oxygen Therapy SpO2: 94 % O2 Device: Room Air Pain: Pain Assessment Pain Scale: Faces Pain Score: 4  Faces Pain Scale: Hurts a little bit Pain Type: Surgical pain Pain Location: Knee Pain Orientation: Right;Left Pain Descriptors / Indicators: Discomfort Pain Onset: With Activity Pain Intervention(s): Repositioned ADL: See Care Tool for some details of ADL   Therapy/Group: Individual Therapy  Aislinn Feliz OTR/L 08/17/2018, 3:09 PM

## 2018-08-17 NOTE — Progress Notes (Signed)
Occupational Therapy Session Note  Patient Details  Name: Larry Fowler MRN: [id number] Date of Birth: 1970/10/09  Today's Date: 08/17/2018 OT Individual Time: 0900-1004 OT Individual Time Calculation (min): 64 min    Short Term Goals: Week 2:  OT Short Term Goal 1 (Week 2): Pt will tolerate sitting EOB for UB selfcare with supervision for 15 mins.   OT Short Term Goal 2 (Week 2): Pt will perform LB bathing supine to sit with mod assist.  OT Short Term Goal 3 (Week 2): Pt will tolerate tilted bed up to 75 degrees for at least 2 mins in preparation for  OT Short Term Goal 4 (Week 2): Pt will use sockaide to donn gripper socks in sitting with setup only.   OT Short Term Goal 5 (Week 2): Pt will perform stand pivot transfer to bariatric 3:1 with total assist +2 (pt 30%).  Skilled Therapeutic Interventions/Progress Updates:    Pt completed bathing and dressing supine rolling.  He completed all UB bathing with supervision as well as dressing with HOB up.  Max assist for LB bathing with supervision for rolling supine to wash his buttocks with total assist for pulling pants over hips.  Pt has LH sponge for use with washing front peri area and legs but he declines use secondary to lack of rigidity in the sponge, so therapist assisted.   Total assist for removal and replacement of hinged knee braces. Finished session with pt in the bed and call button and phone in reach.    Therapy Documentation Precautions:  Precautions Precautions: Fall Precaution Comments: NO knee flexion Required Braces or Orthoses: Other Brace Knee Immobilizer - Right: On at all times Knee Immobilizer - Left: On at all times Other Brace: Bilateral Bledsoe braces locked in extension Restrictions Weight Bearing Restrictions: No RLE Weight Bearing: Weight bearing as tolerated LLE Weight Bearing: Weight bearing as tolerated   Pain:  Pain 2/10 faces for bilateral knees ADL: See Care Tool Section for some  detials  Therapy/Group: Individual Therapy  Anelis Hrivnak OTR/L 08/17/2018, 12:27 PM

## 2018-08-17 NOTE — Progress Notes (Signed)
Occupational Therapy Session Note  Patient Details  Name: ZURI BRADWAY MRN: [id number] Date of Birth: 11/15/1970  Today's Date: 08/17/2018 OT Individual Time: 1445-1515 OT Individual Time Calculation (min): 30 min    Short Term Goals: Week 1:  OT Short Term Goal 1 (Week 1): Pt will tolerate sitting EOB for UB selfcare with supervision for 15 mins.   OT Short Term Goal 1 - Progress (Week 1): Not met OT Short Term Goal 2 (Week 1): Pt will perform LB bathing supine to sit with mod assist.  OT Short Term Goal 2 - Progress (Week 1): Not met OT Short Term Goal 3 (Week 1): Pt will tolerate tilted bed up to 75 degrees for at least 2 mins in preparation for  OT Short Term Goal 3 - Progress (Week 1): Not met OT Short Term Goal 4 (Week 1): Pt will use sockaide to donn gripper socks in sitting with setup only.   OT Short Term Goal 4 - Progress (Week 1): Not met OT Short Term Goal 5 (Week 1): Pt will perform stand pivot transfer to bariatric 3:1 with total assist +2 (pt 30%). OT Short Term Goal 5 - Progress (Week 1): Not met Week 2:  OT Short Term Goal 1 (Week 2): Pt will tolerate sitting EOB for UB selfcare with supervision for 15 mins.   OT Short Term Goal 2 (Week 2): Pt will perform LB bathing supine to sit with mod assist.  OT Short Term Goal 3 (Week 2): Pt will tolerate tilted bed up to 75 degrees for at least 2 mins in preparation for  OT Short Term Goal 4 (Week 2): Pt will use sockaide to donn gripper socks in sitting with setup only.   OT Short Term Goal 5 (Week 2): Pt will perform stand pivot transfer to bariatric 3:1 with total assist +2 (pt 30%). Week 3:     Skilled Therapeutic Interventions/Progress Updates:    1:1 Continued to discussed d/c plans and functional mobility and ADLs at home with his wife and problem solving through different scenarios.  Pt attempted to stand from w/c 3 times but unable to push himself up enough with his UEs to lift up his bottom but was able to move his  LEs more under him as he weight shifts- but no clearance. Discussed possibly of [facility] for transportation home and to and from appointments. Also discussed with SW.    Therapy Documentation Precautions:  Precautions Precautions: Fall Precaution Comments: NO knee flexion Required Braces or Orthoses: Other Brace Knee Immobilizer - Right: On at all times Knee Immobilizer - Left: On at all times Other Brace: Bilateral Bledsoe braces locked in extension Restrictions Weight Bearing Restrictions: No RLE Weight Bearing: Weight bearing as tolerated LLE Weight Bearing: Weight bearing as tolerated General:   Vital Signs: Therapy Vitals Temp: 98 F (36.7 C) Pulse Rate: 71 Resp: 18 BP: 117/61 Patient Position (if appropriate): Sitting Oxygen Therapy SpO2: 94 % O2 Device: Room Air Pain: No c/o in session Other Treatments:     Therapy/Group: Individual Therapy  Willeen Cass [place] 08/17/2018, 4:44 PM

## 2018-08-17 NOTE — Progress Notes (Signed)
Physical Therapy Session Note  Patient Details  Name: Larry Fowler MRN: [id number] Date of Birth: Feb 18, 1971  Today's Date: 08/17/2018 PT Individual Time: 1100-1200 PT Individual Time Calculation (min): 60 min   Short Term Goals: Week 2:  PT Short Term Goal 1 (Week 2): Pt will propell WC > 13f with superivsion assist through rehab unit  PT Short Term Goal 2 (Week 2): Pt [facility]  PT Short Term Goal 3 (Week 2): Pt will perform sit<>stand form mat table with mod assist   Skilled Therapeutic Interventions/Progress Updates:   Pt received supine in bed and agreeable to PT. PT applied Ace wraps to BLE and secured Bledsoe braces. Rolling R and L with supervision assist from PT for placement of maxisky sling. [facility].  Pt transported to day room. UBE 2 x 3 min with min cues for proper speed at level 5. WC mobility training in various environments including hall of rehab unit x [id number] supervision assist and over unlevel cement sidewalk 2 x [id number] min assist to prevent loss of progress on uphill grade improve control on down hill. Patient returned to room and [facility] in reach and all needs met.        Therapy Documentation Precautions:  Precautions Precautions: Fall Precaution Comments: NO knee flexion Required Braces or Orthoses: Other Brace Knee Immobilizer - Right: On at all times Knee Immobilizer - Left: On at all times Other Brace: Bilateral Bledsoe braces locked in extension Restrictions Weight Bearing Restrictions: No RLE Weight Bearing: Weight bearing as tolerated LLE Weight Bearing: Weight bearing as tolerated Vital Signs: Therapy Vitals Temp: 98 F (36.7 C) Pulse Rate: 71 Resp: 18 BP: 117/61 Patient Position (if appropriate): Sitting Oxygen Therapy SpO2: 94 % O2 Device: Room Air Pain: Pain Assessment Pain Scale: Faces Faces  Pain Scale: Hurts a little bit Pain Type: Surgical pain Pain Location: Knee Pain Orientation: Right;Left Pain Descriptors / Indicators: Discomfort Pain Onset: With Activity Pain Intervention(s): Repositioned    Therapy/Group: Individual Therapy  AustLorie Phenix2/2020, 5:42 PM

## 2018-08-17 NOTE — Progress Notes (Signed)
PHYSICAL MEDICINE & REHABILITATION PROGRESS NOTE  Subjective/Complaints: Patient seen lying in bed this morning.  He states he slept fairly overnight because he did not take his sleeping medication until late.  ROS: Denies CP, shortness of breath, nausea, vomiting, diarrhea.  Objective: Vital Signs: Blood pressure 134/63, pulse 64, temperature 98.6 F (37 C), temperature source Oral, resp. rate 18, height 6\' 2"  (1.88 m), weight (!) 226.9 kg, SpO2 98 %. No results found. Recent Labs    08/15/18 0436  WBC 9.0  HGB 10.2*  HCT 33.0*  PLT 205   Recent Labs    08/15/18 0436  CREATININE 0.66    Physical Exam: BP 134/63 (BP Location: Right Arm)   Pulse 64   Temp 98.6 F (37 C) (Oral)   Resp 18   Ht 6\' 2"  (1.88 m)   Wt (!) 226.9 kg   SpO2 98%   BMI 64.22 kg/m  Constitutional: No distress . Vital signs reviewed.  Morbidly obese. HENT: Normocephalic.  Atraumatic. Eyes: EOMI. No discharge. Cardiovascular: RRR.  No JVD. Respiratory: CTA bilaterally.  Normal effort. GI: BS +. Non-distended. Musc: Bilateral lower edema and tenderness  Neurological: He is alert and oriented Motor: Bilateral upper extremities: 5/5 proximal to distal Right lower extremity: Hip flexion 3+/5, Knee extension locked in brace, ankle dorsiflexion 5/5, stable Left lower extremity: Hip flexion 3+/5, knee extension locked in brace, ankle dorsiflexion 5/5, stable Skin: Bilateral Bledsoe braces in place with incisions C/D/I  Psychiatric: He has a normal mood and affect. His behavior is normal  Assessment/Plan: 1. 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  Physiatrist is providing close team supervision and 24 hour management of active medical problems listed below.  Physiatrist and rehab team continue to assess barriers to discharge/monitor patient  progress toward functional and medical goals  Care Tool:  Bathing    Body parts bathed by patient: Face, Right arm, Left arm, Abdomen, Chest, Left upper leg, Right upper leg(sitting to supine)   Body parts bathed by helper: Front perineal area, Buttocks Body parts n/a: Right arm, Left arm, Chest, Abdomen, Left lower leg, Right lower leg, Left upper leg, Right upper leg(Did not attempt secondary to pt washing last night)   Bathing assist Assist Level: Moderate Assistance - Patient 50 - 74%     Upper Body Dressing/Undressing Upper body dressing   What is the patient wearing?: Pull over shirt    Upper body assist Assist Level: Supervision/Verbal cueing    Lower Body Dressing/Undressing Lower body dressing      What is the patient wearing?: Pants     Lower body assist Assist for lower body dressing: Total Assistance - Patient < 25%     Toileting Toileting    Toileting assist Assist for toileting: 2 Helpers     Transfers Chair/bed transfer  Transfers assist  Chair/bed transfer activity did not occur: Safety/medical concerns  Chair/bed transfer assist level: Dependent - mechanical lift(+2 assist)     Locomotion Ambulation   Ambulation assist   Ambulation activity did not occur: Safety/medical concerns          Walk 10 feet activity   Assist  Walk 10 feet activity did not occur: Safety/medical concerns        Walk 50 feet activity   Assist Walk 50 feet with 2 turns activity did not occur: Safety/medical concerns  Walk 150 feet activity   Assist Walk 150 feet activity did not occur: Safety/medical concerns         Walk 10 feet on uneven surface  activity   Assist Walk 10 feet on uneven surfaces activity did not occur: Safety/medical concerns         Wheelchair     Assist   Type of Wheelchair: Manual Wheelchair activity did not occur: Safety/medical concerns  Wheelchair assist level: Supervision/Verbal cueing Max  wheelchair distance: 176ft    Wheelchair 50 feet with 2 turns activity    Assist    Wheelchair 50 feet with 2 turns activity did not occur: Safety/medical concerns   Assist Level: Supervision/Verbal cueing   Wheelchair 150 feet activity     Assist Wheelchair 150 feet activity did not occur: Safety/medical concerns   Assist Level: Supervision/Verbal cueing      Medical Problem List and Plan: 1.  Decreased functional mobility secondary to bilateral quadricep tendon rupture. Status post repair of bilateral tendon rupture repair 07/21/2018. Weightbearing as tolerated. Bilateral Bledsoe brace locked in extension             Continue CIR 2.  Antithrombotics: -DVT/anticoagulation:  Subcutaneous Lovenox 110 mg daily.   Lower extremity vascular ultrasound limited, but negative             -antiplatelet therapy: Not applicable 3. Pain Management:  Lidoderm patch  Wean oxycontin to qhs, DC'd on 3/5  Oxycodone immediate release for breakthrough pain  Control on 3/12 4. Mood:  Zoloft 25 mg daily. Provide emotional support  Appreciate Neuropsych consult- discussed with provider previously             -antipsychotic agents:  Not applicable 5. Neuropsych: This patient is capable of making decisions on his own behalf. 6. Skin/Wound Care: Stage I pressure ulcer. Follow-up wound care nurse Routine skin checks 7. Fluids/Electrolytes/Nutrition:  Routine interval mouth with follow-up  BMP within acceptable range on 3/9 8. Acute blood loss anemia. Transfused 1 unit packed red blood cells postoperatively.   Hemoglobin 10.2 on 3/10  Continue to monitor 9.  Super super obesity. Follow-up dietary services 10. New findingsType 2 diabetes mellitus. Hemoglobin A1c 7.1.  Check blood sugars before meals and at bedtime. Diabetic teaching  Monitor with increased mobility CBG (last 3)  Recent Labs    08/16/18 1707 08/16/18 2133 08/17/18 0631  GLUCAP 137* 112* 131*   Glucophage 500 mg daily,  increased to 850 on 3/10  Remains elevated, but improving on 3/12 11. Acute otitis media. Augmentin 875-125 mg initiated 07/30/2018. 12. Constipation. Laxative assistance 13. Asthma/OSA/tobacco/ marijuana abuse. CPAP as directed.Continue Symbicort as well as nebulizer treatments. Provide counseling regards to tobacco abuse  Dulera DC'd per patient on 3/10 14.  Hypoalbuminemia  Supplement initiated on 2/26, DC'd on 3/4 per patient 15.  Labile blood pressure   Vitals:   08/16/18 1954 08/17/18 0632  BP: 121/74 134/63  Pulse: 70 64  Resp: 18 18  Temp: 98.7 F (37.1 C) 98.6 F (37 C)  SpO2: 98% 98%   Controlled on 3/12 16.  Sleep disturbance  Restoril started on 2/27  Trazodone decreased to 50 on 3/11  Improved 17. Sore throat  Chloraseptic spray ordered 18.  Pruritus  Benadryl cream ordered on 3/2, now patient states ineffective  Oral Benadryl PRN ordered  Improved 19.  Congestion in ears  Debrox ordered  Improved  LOS: 16 days A FACE TO FACE EVALUATION WAS PERFORMED  Larry Fowler 08/17/2018, 11:01  AM

## 2018-08-17 NOTE — Patient Care Conference (Signed)
Inpatient RehabilitationTeam Conference and Plan of Care Update Date: 08/16/2018   Time: 2:35 PM    Patient Name: Larry Fowler      Medical Record Number: [id number]  Date of Birth: 01-16-1971 Sex: Male         Room/Bed: 4W15C/4W15C-01 Payor Info: Payor: [employer] / Plan: [place] [employer]OC[employer] / Product Type: *No Product type* /    Admitting Diagnosis: BQUAD PENDON RUPURES  Admit Date/Time:  08/01/2018  3:58 PM Admission Comments: No comment available   Primary Diagnosis:  <principal problem not specified> Principal Problem: <principal problem not specified>  Patient Active Problem List   Diagnosis Date Noted  . Congestion of both ears   . Pruritus   . Episode of recurrent major depressive disorder (HCC)   . Labile blood glucose   . Sore throat   . Sleep disturbance   . Labile blood pressure   . Hypoalbuminemia due to protein-calorie malnutrition (HCC)   . New onset type 2 diabetes mellitus (HCC)   . Marijuana abuse   . Drug induced constipation   . Otitis media   . Acute blood loss anemia   . Pressure injury of skin 07/30/2018  . Anxiety and depression   . Super-super obese (HCC)   . Postoperative pain   . Tobacco abuse   . Postoperative anemia due to acute blood loss 07/23/2018    Class: Acute  . Rupture of left quadriceps muscle   . Quadriceps tendon rupture 07/20/2018  . Nutritional counseling 07/03/2018  . Disorder of tendon of left biceps 12/05/2017  . Hypogonadism in male 10/25/2017  . Hyperglycemia   . Preventative health care 03/21/2017  . Morbid obesity (HCC) 03/21/2017  . OSA (obstructive sleep apnea) 03/21/2017  . Chronic pain 03/21/2017  . Smoker 03/21/2017  . HTN (hypertension) 03/21/2017  . Fatigue 03/21/2017  . Left inguinal hernia 03/21/2017  . Rash 03/21/2017  . History of cocaine use   . Cannabis abuse   . Renal stone   . Anal abscess, ? fistula 04/05/2011  . Asthma 12/17/2009  . 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  DEPRESSION 09/12/2007  . Allergic rhinitis 09/12/2007  . ANXIETY 02/16/2007    Expected Discharge Date: Expected Discharge Date: 08/30/18  Team Members Present: Physician leading conference: Dr. Maryla Morrow Social Worker Present: Amada Jupiter, LCSW Nurse Present: Ronny Bacon, RN PT Present: Grier Rocher, PT;[name], PTA OT Present: Perrin Maltese, OT PPS Coordinator present : Fae Pippin     Current Status/Progress Goal Weekly Team Focus  Medical   Decreased functional mobility secondary to bilateral quadricep tendon rupture. Status post repair of bilateral tendon rupture repair 07/21/2018. Weightbearing as tolerated.   Improve mobility, transfers, self-care, DM, pain  See above   Bowel/Bladder   continent of b/b; LBM 08/15/18  remain continent of b/b  assist with tolieting as needed   Swallow/Nutrition/ Hydration             ADL's   supervision for UB selfcare, mod assist for LB bathing, max assist for LB dressing all self care in supine.  Lift needed for toilet transfers to the bariatric toilet and the wheelchair.  Total +2 for sit to stand from elevated mat  supervision for UB selfcare with min assist for most other tasks  selfcare retraining, balance retraining, therapeutic exercises, family/patient education   Mobility   Supervision rolling L/R, MaxiSky transfers, STS from elevated mat CGA, pre-gait/wt shfiting initiated in RW and parallel bars  Mod assist transfers  and from [employer] [employer]. gait with RW up to 10-15 ft with mod assist adn Bil KI.   pre-gait/gait, transfers, endurance   Communication             Safety/Cognition/ Behavioral Observations            Pain   Oxycodone q4hr to control pain   pain free or pain level 3/10  assess q shift and prn    Skin   stage 2 on bottom, garharts butt cream and nystatin powder for skin break down   remain free of new skin breakdown/infection  assess q shift and prn     Rehab Goals Patient on target to meet rehab goals:  Yes *See Care Plan and progress notes for long and short-term goals.     Barriers to Discharge  Current Status/Progress Possible Resolutions Date Resolved   Physician    Medical stability;Weight;Decreased caregiver support;Lack of/limited family support     See above  Therapies, optimize DM meds, optimize pain meds      Nursing                  PT                    OT                  SLP                SW                Discharge Planning/Teaching Needs:  Pt to d/c home with wife as primary caregiver;  other family will likely need to assist in order to covery 24/7 care.  Teaching needs TBD.   Team Discussion:  Chronic c/o of minor issues continue daily.  B/d supine with supervision UB and max with LB.  Hope to transition to sitting EOB.  Still using STEDY.  Sanding ~ 30-45 sec and took 7 steps today with PT.  Able to come to standing from 27" height with CGA.  Continue to work on DME (SW).  Revisions to Treatment Plan:  NA    Continued Need for Acute Rehabilitation Level of Care: The patient requires daily medical management by a physician with specialized training in physical medicine and rehabilitation for the following conditions: Daily direction of a multidisciplinary physical rehabilitation program to ensure safe treatment while eliciting the highest outcome that is of practical value to the patient.: Yes Daily medical management of patient stability for increased activity during participation in an intensive rehabilitation regime.: Yes Daily analysis of laboratory values and/or radiology reports with any subsequent need for medication adjustment of medical intervention for : Post surgical problems;Diabetes problems;Other   I attest that I was present, lead the team conference, and concur with the assessment and plan of the team.   Tavita Eastham 08/17/2018, 11:23 AM

## 2018-08-18 ENCOUNTER — Inpatient Hospital Stay (HOSPITAL_COMMUNITY): Payer: Self-pay | Admitting: Occupational Therapy

## 2018-08-18 ENCOUNTER — Inpatient Hospital Stay (HOSPITAL_COMMUNITY): Payer: Self-pay | Admitting: Physical Therapy

## 2018-08-18 LAB — GLUCOSE, CAPILLARY
Glucose-Capillary: 147 mg/dL — ABNORMAL HIGH (ref 70–99)
Glucose-Capillary: 115 mg/dL — ABNORMAL HIGH (ref 70–99)
Glucose-Capillary: 164 mg/dL — ABNORMAL HIGH (ref 70–99)
Glucose-Capillary: 143 mg/dL — ABNORMAL HIGH (ref 70–99)

## 2018-08-18 MED ORDER — DIPHENHYDRAMINE HCL 12.5 MG/5ML PO ELIX
12.5000 mg | ORAL_SOLUTION | Freq: Every day | ORAL | Status: DC | PRN
  Administered 2018-08-18 – 2018-08-19 (×2): 12.5 mg via ORAL
  Filled 2018-08-18 (×2): qty 10

## 2018-08-18 NOTE — Progress Notes (Signed)
Social Work Patient ID: Larry Fowler, male   DOB: 08-16-1970, 48 y.o.   MRN: [id number]   Met with pt and wife following conference and again yesterday to review team report and discuss d/c planning issues.  They are aware of targeted d/c date of 3/25 and mod assist goals overall.  Pt is not in agreement as he feels he will need a longer LOS and his goals are to be abe to walk a few steps and be able to toilet himself.  He is making gains, however, not clear if goals will be able to be upgraded or LOS extended.   Having a difficult time confirming a DME company who is in network with insurance and can provide [place] with size of w/c needed  ( I can get hospital bed and commode via [facility]).  Pt is talking with a friend of his who is reporting that he has a motorized w/c and other DME he used in the past and his weight was ~500lbs at that time.  Will follow up with pt about this today.    Continue to follow and address pt/ wife concerns and work on d/c needs.  Tashea Othman, LCSW

## 2018-08-18 NOTE — Progress Notes (Signed)
Pt has home CPAP at bedside and can use himself. No further assistance needed.

## 2018-08-18 NOTE — Progress Notes (Signed)
Occupational Therapy Session Note  Patient Details  Name: Larry Fowler MRN: [id number] Date of Birth: 1970-07-01  Today's Date: 08/18/2018 OT Individual Time: 0903-1003 OT Individual Time Calculation (min): 60 min    Short Term Goals: Week 2:  OT Short Term Goal 1 (Week 2): Pt will tolerate sitting EOB for UB selfcare with supervision for 15 mins.   OT Short Term Goal 2 (Week 2): Pt will perform LB bathing supine to sit with mod assist.  OT Short Term Goal 3 (Week 2): Pt will tolerate tilted bed up to 75 degrees for at least 2 mins in preparation for  OT Short Term Goal 4 (Week 2): Pt will use sockaide to donn gripper socks in sitting with setup only.   OT Short Term Goal 5 (Week 2): Pt will perform stand pivot transfer to bariatric 3:1 with total assist +2 (pt 30%).  Skilled Therapeutic Interventions/Progress Updates:    Pt completed toilet transfer with use of the Maxi sky to start session.  Max assist for placement of sling in supine with pt rolling with supervision.  He then transferred over to the toilet with use total assist using the lift and lowered himself down on the seat.  He needed placement of his LEs on chair for decreased knee pain bilaterally.  Total assist with use of lift again for transfer back to the bed with total assist for toilet hygiene.  Pt then completed UB bathing and dressing from supine with HOB elevated and supervision.  LB bathing and dressing with total assist including donning and removal of hinged knee braces.  Pt left in the bed at end of session with call button and phone in reach and safety belt in place.    Therapy Documentation Precautions:  Precautions Precautions: Fall Precaution Comments: NO knee flexion Required Braces or Orthoses: Other Brace Knee Immobilizer - Right: On at all times Knee Immobilizer - Left: On at all times Other Brace: Bilateral Bledsoe braces locked in extension Restrictions Weight Bearing Restrictions: No RLE Weight  Bearing: Weight bearing as tolerated LLE Weight Bearing: Weight bearing as tolerated  Pain: Pain Assessment Pain Scale: Faces Pain Score: 2  Pain Type: Surgical pain Pain Location: Knee Pain Orientation: Right;Left Pain Descriptors / Indicators: Discomfort Pain Onset: With Activity Pain Intervention(s): Repositioned ADL: See Care Tool For some details of ADL  Therapy/Group: Individual Therapy  Jodelle Fausto OTR/L 08/18/2018, 12:24 PM

## 2018-08-18 NOTE — Progress Notes (Signed)
[facility] PHYSICAL MEDICINE & REHABILITATION PROGRESS NOTE  Subjective/Complaints: Patient seen laying in bed this morning.  He states he slept well overnight.  He states that the amount of Benadryl he is given is "for boards".  He now states that it does not help him at all.  ROS: Denies CP, shortness of breath, nausea, vomiting, diarrhea.  Objective: Vital Signs: Blood pressure (!) 111/58, pulse 75, temperature 98.1 F (36.7 C), resp. rate 19, height 6\' 2"  (1.88 m), weight (!) 226.9 kg, SpO2 94 %. No results found. No results for input(s): WBC, HGB, HCT, PLT in the last 72 hours. No results for input(s): NA, K, CL, CO2, GLUCOSE, BUN, CREATININE, CALCIUM in the last 72 hours.  Physical Exam: BP (!) 111/58 (BP Location: Right Wrist)   Pulse 75   Temp 98.1 F (36.7 C)   Resp 19   Ht 6\' 2"  (1.88 m)   Wt (!) 226.9 kg   SpO2 94%   BMI 64.22 kg/m  Constitutional: No distress . Vital signs reviewed.  Morbidly obese. HENT: Normocephalic.  Atraumatic. Eyes: EOMI. No discharge. Cardiovascular: RRR.  No JVD. Respiratory: CTA bilaterally.  Normal effort. GI: BS +. Non-distended. Musc: Bilateral lower edema and tenderness  Neurological: He is alert and oriented Motor: Bilateral upper extremities: 5/5 proximal to distal Right lower extremity: Hip flexion 3+/5, Knee extension locked in brace, ankle dorsiflexion 5/5, unchanged Left lower extremity: Hip flexion 3+/5, knee extension locked in brace, ankle dorsiflexion 5/5, unchanged Skin: Bilateral Bledsoe braces in place with incisions C/D/I  Psychiatric: He has a normal mood and affect. His behavior is normal  Assessment/Plan: 1. 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  Physiatrist is providing close team supervision and 24 hour management of active medical problems listed  below.  Physiatrist and rehab team continue to assess barriers to discharge/monitor patient progress toward functional and medical goals  Care Tool:  Bathing    Body parts bathed by patient: Right arm, Left arm, Chest, Abdomen, Face   Body parts bathed by helper: Front perineal area, Buttocks, Right lower leg, Left lower leg, Left upper leg, Right upper leg Body parts n/a: Right arm, Left arm, Chest, Abdomen, Left lower leg, Right lower leg, Left upper leg, Right upper leg(Did not attempt secondary to pt washing last night)   Bathing assist Assist Level: Moderate Assistance - Patient 50 - 74%     Upper Body Dressing/Undressing Upper body dressing   What is the patient wearing?: Pull over shirt    Upper body assist Assist Level: Supervision/Verbal cueing    Lower Body Dressing/Undressing Lower body dressing      What is the patient wearing?: Pants     Lower body assist Assist for lower body dressing: Maximal Assistance - Patient 25 - 49%     Toileting Toileting    Toileting assist Assist for toileting: Total Assistance - Patient < 25%     Transfers Chair/bed transfer  Transfers assist  Chair/bed transfer activity did not occur: Safety/medical concerns  Chair/bed transfer assist level: Dependent - mechanical lift     Locomotion Ambulation   Ambulation assist   Ambulation activity did not occur: Safety/medical concerns          Walk 10 feet activity   Assist  Walk 10 feet activity did not occur: Safety/medical concerns        Walk 50 feet activity  Assist Walk 50 feet with 2 turns activity did not occur: Safety/medical concerns         Walk 150 feet activity   Assist Walk 150 feet activity did not occur: Safety/medical concerns         Walk 10 feet on uneven surface  activity   Assist Walk 10 feet on uneven surfaces activity did not occur: Safety/medical concerns         Wheelchair     Assist   Type of Wheelchair:  Manual Wheelchair activity did not occur: Safety/medical concerns  Wheelchair assist level: Supervision/Verbal cueing Max wheelchair distance: 150    Wheelchair 50 feet with 2 turns activity    Assist    Wheelchair 50 feet with 2 turns activity did not occur: Safety/medical concerns   Assist Level: Supervision/Verbal cueing   Wheelchair 150 feet activity     Assist Wheelchair 150 feet activity did not occur: Safety/medical concerns   Assist Level: Supervision/Verbal cueing      Medical Problem List and Plan: 1.  Decreased functional mobility secondary to bilateral quadricep tendon rupture. Status post repair of bilateral tendon rupture repair 07/21/2018. Weightbearing as tolerated. Bilateral Bledsoe brace locked in extension             Continue CIR 2.  Antithrombotics: -DVT/anticoagulation:  Subcutaneous Lovenox 110 mg daily.   Lower extremity vascular ultrasound limited, but negative             -antiplatelet therapy: Not applicable 3. Pain Management:  Lidoderm patch  Wean oxycontin to qhs, DC'd on 3/5  Oxycodone immediate release for breakthrough pain  Control on 3/13 4. Mood:  Zoloft 25 mg daily. Provide emotional support  Appreciate Neuropsych consult- discussed with provider previously             -antipsychotic agents:  Not applicable 5. Neuropsych: This patient is capable of making decisions on his own behalf. 6. Skin/Wound Care: Stage I pressure ulcer. Follow-up wound care nurse Routine skin checks 7. Fluids/Electrolytes/Nutrition:  Routine interval mouth with follow-up  BMP within acceptable range on 3/9  Labs ordered for Monday 8. Acute blood loss anemia. Transfused 1 unit packed red blood cells postoperatively.   Hemoglobin 10.2 on 3/10  Labs ordered for Monday  Continue to monitor 9.  Super super obesity. Follow-up dietary services 10. New findingsType 2 diabetes mellitus. Hemoglobin A1c 7.1.  Check blood sugars before meals and at bedtime.  Diabetic teaching  Monitor with increased mobility CBG (last 3)  Recent Labs    08/17/18 2115 08/18/18 0622 08/18/18 1146  GLUCAP 151* 147* 115*   Glucophage 500 mg daily, increased to 850 on 3/10  Slightly labile on 3/13 11. Acute otitis media. Augmentin 875-125 mg initiated 07/30/2018. 12. Constipation. Laxative assistance 13. Asthma/OSA/tobacco/ marijuana abuse. CPAP as directed.Continue Symbicort as well as nebulizer treatments. Provide counseling regards to tobacco abuse  Dulera DC'd per patient on 3/10 14.  Hypoalbuminemia  Supplement initiated on 2/26, DC'd on 3/4 per patient 15.  Labile blood pressure   Vitals:   08/17/18 2041 08/18/18 0410  BP: (!) 115/59 (!) 111/58  Pulse: 69 75  Resp: 18 19  Temp: 98 F (36.7 C) 98.1 F (36.7 C)  SpO2: 99% 94%   Controlled on 3/13 16.  Sleep disturbance  Restoril started on 2/27  Trazodone decreased to 50 on 3/11  Improved 17. Sore throat  Chloraseptic spray ordered 18.  Pruritus  Benadryl cream ordered on 3/2, now patient states ineffective  Oral Benadryl  PRN ordered, increased on 3/13 19.  Congestion in ears  Debrox ordered  Improved for now  LOS: 17 days A FACE TO FACE EVALUATION WAS PERFORMED  Ankit Karis Juba 08/18/2018, 2:00 PM

## 2018-08-18 NOTE — Progress Notes (Signed)
Occupational Therapy Weekly Progress Note  Patient Details  Name: Larry Fowler MRN: [id number] Date of Birth: 02/24/1971  Beginning of progress report period: August 11, 2018 End of progress report period: August 18, 2018  Today's Date: 08/18/2018 OT Individual Time: 2774-1287 OT Individual Time Calculation (min): 31 min    Patient has met 2 of 5 short term goals.  Pt completes UB selfcare in supine with overall supervision.  He is able to complete LB bathing supine rolling with max assist overall and occasional use of a LH sponge for washing his upper and some of his lower legs.  He continues to need assist with thoroughness when washing his peri area and buttocks secondary to not being able to reach them.  Total assist is needed for donning shorts over legs and hips as well as for donning bilateral hinged knee braces.  Transfers are currently still total assist with use of the maxi sky onto all surfaces.  He is able to complete sit to stand from a higher surface elevated mat with total assist +2 (pt 30%) and ambulate at the same level for up to 17 ft.  Feel his progress remains slow based on his body habitus as well as his inability to complete sit to stand without active allowed knee flexion.  Will continue with current OT POC to continue working on established LTGs set at min assist level.    Patient continues to demonstrate the following deficits: muscle weakness and decreased standing balance and decreased balance strategies and therefore will continue to benefit from skilled OT intervention to enhance overall performance with BADL and Reduce care partner burden.  Patient progressing toward long term goals..  Continue plan of care.  OT Short Term Goals Week 3:  OT Short Term Goal 1 (Week 3): Pt will complete sit to stand from bariatric 3:1 with total assist +2 (pt 40%). OT Short Term Goal 2 (Week 3): Pt will complete LB dressing with mod assist using AE supine to sit. OT Short Term Goal  3 (Week 3): Pt will perform stand pivot transfer to the bariatric 3:1 from elevated wheelchair with total assist +2 (pt 30%)  Skilled Therapeutic Interventions/Progress Updates:    Pt worked on toileting during session with use of the [facility] for transfer on and off of the toilet.  He needed assist with holding his LEs when being lifted up but could control the lift on his own for transfer to the bed.  Supervision for rolling in supine with total assist for peri hygiene.  Pt's spouse present as well as able to observe transfer as well as hygiene completion.  Pt left in bed with call button and phone in reach at end of session.    Therapy Documentation Precautions:  Precautions Precautions: Fall Precaution Comments: NO knee flexion Required Braces or Orthoses: Other Brace Knee Immobilizer - Right: On at all times Knee Immobilizer - Left: On at all times Other Brace: Bilateral Bledsoe braces locked in extension Restrictions Weight Bearing Restrictions: No RLE Weight Bearing: Weight bearing as tolerated LLE Weight Bearing: Weight bearing as tolerated  Pain: Pain Assessment Pain Scale: Faces Faces Pain Scale: Hurts a little bit Pain Type: Surgical pain Pain Location: Knee Pain Orientation: Right;Left Pain Descriptors / Indicators: Discomfort Pain Onset: With Activity Pain Intervention(s): Repositioned ADL: See Care Tool Section for some details of ADLs  Therapy/Group: Individual Therapy  Stefano Trulson OTR/L 08/18/2018, 4:50 PM

## 2018-08-19 LAB — GLUCOSE, CAPILLARY
GLUCOSE-CAPILLARY: 114 mg/dL — AB (ref 70–99)
GLUCOSE-CAPILLARY: 167 mg/dL — AB (ref 70–99)
Glucose-Capillary: 144 mg/dL — ABNORMAL HIGH (ref 70–99)
Glucose-Capillary: 121 mg/dL — ABNORMAL HIGH (ref 70–99)
Glucose-Capillary: 176 mg/dL — ABNORMAL HIGH (ref 70–99)

## 2018-08-19 NOTE — Progress Notes (Signed)
Physical Therapy Weekly Progress Note  Patient Details  Name: Larry Fowler MRN: [id number] Date of Birth: 12-Mar-1971  Beginning of progress report period: August 11, 2018 End of progress report period: August 19, 2018  Today's Date: 08/19/2018     Patient has met 3 of 3 short term goals.  Pt is making steady prrogress towards LTG's. Pt has progressed to require supervision assist rolling in bed. Min-CGA for supine>sit at [facility]. Sit<>stand from elevated bed height as low as 27 inches with supervision assist to stabilize RW. Pt has been consistently supervision assist with WC on level surface and min assist on cement side walk with slight elevation. Pt has also began to perform gait training with minA-CGA  With RW up to 15 ft and BLE supported in locked bledsoe braces.   Patient continues to demonstrate the following deficits muscle weakness and muscle joint tightness, decreased cardiorespiratoy endurance and decreased standing balance, decreased balance strategies and difficulty maintaining precautions and therefore will continue to benefit from skilled PT intervention to increase functional independence with mobility.  Patient progressing toward long term goals..  Continue plan of care.  PT Short Term Goals Week 2:  PT Short Term Goal 1 (Week 2): Pt will propell WC > 154f with superivsion assist through rehab unit  PT Short Term Goal 1 - Progress (Week 2): Met PT Short Term Goal 2 (Week 2): Pt Will perform [facility]  PT Short Term Goal 2 - Progress (Week 2): Met PT Short Term Goal 3 (Week 2): Pt will perform sit<>stand form mat table with mod assist  PT Short Term Goal 3 - Progress (Week 2): Met Week 3:  PT Short Term Goal 1 (Week 3): Pt will perform SB to elevated surface with min assist  PT Short Term Goal 2 (Week 3): Pt will ambulate 273fwith RW and supervision assist  PT Short Term Goal 3 (Week 3): Pt will perform sit<>stand from 25inch mat table  with supervision assist   Skilled Therapeutic Interventions/Progress Updates:    Continue POC. : see above  Therapy Documentation Precautions:  Precautions Precautions: Fall Precaution Comments: NO knee flexion Required Braces or Orthoses: Other Brace Knee Immobilizer - Right: On at all times Knee Immobilizer - Left: On at all times Other Brace: Bilateral Bledsoe braces locked in extension Restrictions Weight Bearing Restrictions: No RLE Weight Bearing: Weight bearing as tolerated LLE Weight Bearing: Weight bearing as tolerated General:  Vital Signs: Therapy Vitals Temp: 97.6 F (36.4 C) Temp Source: Oral Pulse Rate: 65 Resp: 18 BP: 128/60 Patient Position (if appropriate): Lying Oxygen Therapy SpO2: 100 % O2 Device: Room Air   [name]/14/2020, 5:20 PM

## 2018-08-19 NOTE — Progress Notes (Signed)
Physical Therapy Session Note  Patient Details  Name: Larry Fowler MRN: [id number] Date of Birth: 14-Feb-1971  Today's Date: 08/19/2018 PT Individual Time: 1015-1115 AND 1300-1400   60 min and 60 min   Short Term Goals: Week 1:  PT Short Term Goal 1 (Week 1): Pt will perform sit<>supine with mod assist  PT Short Term Goal 1 - Progress (Week 1): Progressing toward goal PT Short Term Goal 2 (Week 1): Pt will remain standing x 5 minutes on TIS bed.  PT Short Term Goal 2 - Progress (Week 1): Met PT Short Term Goal 3 (Week 1): Pt will perform sit<>stand from elevated surface with max + 2 assist   PT Short Term Goal 3 - Progress (Week 1): Progressing toward goal Week 2:  PT Short Term Goal 1 (Week 2): Pt will propell WC > 139f with superivsion assist through rehab unit  PT Short Term Goal 2 (Week 2): Pt Will perform [facility]  PT Short Term Goal 3 (Week 2): Pt will perform sit<>stand form mat table with mod assist   Skilled Therapeutic Interventions/Progress Updates:  Session 1 Pt received supine in bed and agreeable to PT. PT secured Bledsoe braces. Rolling R and L with supervision assist. [facility] mobility training through hall of rehab unit 2 x 1528fwith supervision assist from PT. Min cues for doorway management. UE therex sitting in WC. tricep extension, level 4 tband 2 x 2 min BUE. Press ups on WC arm rest 2 x 10. PT reviewed Home measurement sheet with Pt to plan access to home. Pt reports that he may have a friend that can provide him with bariatric scooter. Also discussed ability to access home in wheel rims [facility] returned to room and [facility] reach and all needs met.    Session 2.   Pt received supine in bed and agreeable to PT.Maxi sky [facility] gym. Maxisky transfer to mat table. Long sitting to supine with supervision assist. Supine>sit with CGA  for safety. Sit<>stand from mat table from 28 inchs, 27 inches and 26.5 inches. supervision assist from PT to stabilize RW. Gait training with Bil bledsoe braces and Bariatric WC x 1054fnd 15 ft.CGA  From PT for safety and min cue to maintain full extension in BLE. SB [facility] with CGA assist from PT for safety and technique for proper placement of BLE. Patient returned to room and left [facility] reach and all needs met.          Therapy Documentation Precautions:  Precautions Precautions: Fall Precaution Comments: NO knee flexion Required Braces or Orthoses: Other Brace Knee Immobilizer - Right: On at all times Knee Immobilizer - Left: On at all times Other Brace: Bilateral Bledsoe braces locked in extension Restrictions Weight Bearing Restrictions: No RLE Weight Bearing: Weight bearing as tolerated LLE Weight Bearing: Weight bearing as tolerated Vital Signs: Therapy Vitals Temp: 98.6 F (37 C) Temp Source: Oral Pulse Rate: 68 Resp: 15 BP: 115/64 Patient Position (if appropriate): Lying Oxygen Therapy SpO2: 95 % O2 Device: Room Air Pain: 2/10 at rest.    Therapy/Group: Individual Therapy  AusLorie Phenix14/2020, 5:45 AM

## 2018-08-19 NOTE — Plan of Care (Signed)
  Problem: RH BLADDER ELIMINATION Goal: RH STG MANAGE BLADDER WITH ASSISTANCE Description STG Manage Bladder With Min Assistance  Outcome: Progressing   Problem: RH SKIN INTEGRITY Goal: RH STG SKIN FREE OF INFECTION/BREAKDOWN Description Patients skin will be free from breakdown and inspected every shift.  Outcome: Progressing Goal: RH STG MAINTAIN SKIN INTEGRITY WITH ASSISTANCE Description STG Maintain Skin Integrity With Min Assistance.  Outcome: Progressing Goal: RH STG ABLE TO PERFORM INCISION/WOUND CARE W/ASSISTANCE Description STG Able To Perform Incision/Wound Care With Assistance. Outcome: Progressing   Problem: RH SAFETY Goal: RH STG ADHERE TO SAFETY PRECAUTIONS W/ASSISTANCE/DEVICE Description STG Adhere to Safety Precautions With Min Assistance/Device.  Outcome: Progressing   Problem: RH PAIN MANAGEMENT Goal: RH STG PAIN MANAGED AT OR BELOW PT'S PAIN GOAL Description Patient will be pain free or pain less than 3 during admission  Outcome: Progressing   Problem: RH KNOWLEDGE DEFICIT GENERAL Goal: RH STG INCREASE KNOWLEDGE OF SELF CARE AFTER HOSPITALIZATION Description Min assist  Outcome: Progressing   Problem: RH BOWEL ELIMINATION Goal: RH STG MANAGE BOWEL WITH ASSISTANCE Description STG Manage Bowel with Min Assistance.  Outcome: Not Progressing Goal: RH STG MANAGE BOWEL W/MEDICATION W/ASSISTANCE Description STG Manage Bowel with Medication with Min Assistance.  Outcome: Not Progressing

## 2018-08-19 NOTE — Progress Notes (Signed)
[place] PHYSICAL MEDICINE & REHABILITATION PROGRESS NOTE  Subjective/Complaints: Says he didn't sleep that well last night but otherwise ok.   ROS: Patient denies fever, rash, sore throat, blurred vision, nausea, vomiting, diarrhea, cough, shortness of breath or chest pain,  back pain, headache, or mood change.   Objective: Vital Signs: Blood pressure 115/64, pulse 68, temperature 98.6 F (37 C), temperature source Oral, resp. rate 15, height 6\' 2"  (1.88 m), weight (!) 226.9 kg, SpO2 95 %. No results found. No results for input(s): WBC, HGB, HCT, PLT in the last 72 hours. No results for input(s): NA, K, CL, CO2, GLUCOSE, BUN, CREATININE, CALCIUM in the last 72 hours.  Physical Exam: BP 115/64 (BP Location: Right Wrist)   Pulse 68   Temp 98.6 F (37 C) (Oral)   Resp 15   Ht 6\' 2"  (1.88 m)   Wt (!) 226.9 kg   SpO2 95%   BMI 64.22 kg/m  Constitutional: No distress . Vital signs reviewed. HEENT: EOMI, oral membranes moist Neck: supple Cardiovascular: RRR without murmur. No JVD    Respiratory: CTA Bilaterally without wheezes or rales. Normal effort    GI: BS +, non-tender, non-distended  Musc: Bilateral lower edema and tenderness  Neurological: He is alert and oriented Motor: Bilateral upper extremities: 5/5 proximal to distal Right lower extremity: Hip flexion 3+/5, Knee extension locked in brace, ankle dorsiflexion 5/5, unchanged Left lower extremity: Hip flexion 3+/5, knee extension locked in brace, ankle dorsiflexion 5/5, unchanged Skin: Bilateral Bledsoe braces in place with incisions C/D/I  Psychiatric: He has a normal mood and affect. His behavior is normal  Assessment/Plan: 1. 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  Physiatrist is providing close team supervision and 24 hour management of active medical problems listed  below.  Physiatrist and rehab team continue to assess barriers to discharge/monitor patient progress toward functional and medical goals  Care Tool:  Bathing    Body parts bathed by patient: Right arm, Left arm, Chest, Abdomen, Face   Body parts bathed by helper: Front perineal area, Buttocks, Right lower leg, Left lower leg, Left upper leg, Right upper leg Body parts n/a: Right arm, Left arm, Chest, Abdomen, Left lower leg, Right lower leg, Left upper leg, Right upper leg(Did not attempt secondary to pt washing last night)   Bathing assist Assist Level: Moderate Assistance - Patient 50 - 74%     Upper Body Dressing/Undressing Upper body dressing   What is the patient wearing?: Pull over shirt    Upper body assist Assist Level: Supervision/Verbal cueing    Lower Body Dressing/Undressing Lower body dressing      What is the patient wearing?: Pants     Lower body assist Assist for lower body dressing: Maximal Assistance - Patient 25 - 49%     Toileting Toileting    Toileting assist Assist for toileting: Total Assistance - Patient < 25%     Transfers Chair/bed transfer  Transfers assist  Chair/bed transfer activity did not occur: Safety/medical concerns  Chair/bed transfer assist level: Contact Guard/Touching assist     Locomotion Ambulation   Ambulation assist   Ambulation activity did not occur: Safety/medical concerns  Assist level: Contact Guard/Touching assist Assistive device: Walker-rolling Max distance: 15   Walk 10 feet activity   Assist  Walk 10 feet activity did not occur: Safety/medical concerns  Assist level: Contact Guard/Touching assist Assistive  device: Walker-rolling   Walk 50 feet activity   Assist Walk 50 feet with 2 turns activity did not occur: Safety/medical concerns         Walk 150 feet activity   Assist Walk 150 feet activity did not occur: Safety/medical concerns         Walk 10 feet on uneven surface   activity   Assist Walk 10 feet on uneven surfaces activity did not occur: Safety/medical concerns         Wheelchair     Assist   Type of Wheelchair: Manual Wheelchair activity did not occur: Safety/medical concerns  Wheelchair assist level: Supervision/Verbal cueing Max wheelchair distance: 143ft    Wheelchair 50 feet with 2 turns activity    Assist    Wheelchair 50 feet with 2 turns activity did not occur: Safety/medical concerns   Assist Level: Supervision/Verbal cueing   Wheelchair 150 feet activity     Assist Wheelchair 150 feet activity did not occur: Safety/medical concerns   Assist Level: Supervision/Verbal cueing      Medical Problem List and Plan: 1.  Decreased functional mobility secondary to bilateral quadricep tendon rupture. Status post repair of bilateral tendon rupture repair 07/21/2018. Weightbearing as tolerated. Bilateral Bledsoe brace locked in extension             Continue CIR 2.  Antithrombotics: -DVT/anticoagulation:  Subcutaneous Lovenox 110 mg daily.   Lower extremity vascular ultrasound limited, but negative             -antiplatelet therapy: Not applicable 3. Pain Management:  Lidoderm patch  Wean oxycontin to qhs, DC'd on 3/5  Oxycodone immediate release for breakthrough pain  Controlled on 3/14 4. Mood:  Zoloft 25 mg daily. Provide emotional support  Appreciate Neuropsych consult- discussed with provider previously             -antipsychotic agents:  Not applicable 5. Neuropsych: This patient is capable of making decisions on his own behalf. 6. Skin/Wound Care: Stage I pressure ulcer. Follow-up wound care nurse Routine skin checks 7. Fluids/Electrolytes/Nutrition:  Routine interval mouth with follow-up  BMP within acceptable range on 3/9  Labs ordered for Monday 8. Acute blood loss anemia. Transfused 1 unit packed red blood cells postoperatively.   Hemoglobin 10.2 on 3/10  Labs ordered for Monday  Continue to  monitor 9.  Super super obesity. Follow-up dietary services 10. New findingsType 2 diabetes mellitus. Hemoglobin A1c 7.1.  Check blood sugars before meals and at bedtime. Diabetic teaching  Monitor with increased mobility CBG (last 3)  Recent Labs    08/18/18 2126 08/19/18 0621 08/19/18 0857  GLUCAP 143* 144* 121*   Glucophage 500 mg daily, increased to 850 on 3/10  Improving control 3/14 11. Acute otitis media. Augmentin 875-125 mg initiated 07/30/2018. 12. Constipation. Laxative assistance 13. Asthma/OSA/tobacco/ marijuana abuse. CPAP as directed.Continue Symbicort as well as nebulizer treatments. Provide counseling regards to tobacco abuse  Dulera DC'd per patient on 3/10 14.  Hypoalbuminemia  Supplement initiated on 2/26, DC'd on 3/4 per patient 15.  Labile blood pressure   Vitals:   08/18/18 2004 08/19/18 0448  BP: (!) 127/58 115/64  Pulse: 69 68  Resp: 12 15  Temp: 98 F (36.7 C) 98.6 F (37 C)  SpO2: 99% 95%   Controlled on 3/14 16.  Sleep disturbance  Restoril started on 2/27  Trazodone decreased to 50 on 3/11  Improved 17. Sore throat  Chloraseptic spray ordered18.  Pruritus  Benadryl cream ordered on 3/2,  now patient states ineffective  Oral Benadryl PRN ordered, increased on 3/13 19.  Congestion in ears  Debrox ordered  Improved for now  LOS: 18 days A FACE TO FACE EVALUATION WAS PERFORMED  Ranelle Oyster 08/19/2018, 11:10 AM

## 2018-08-20 LAB — GLUCOSE, CAPILLARY
Glucose-Capillary: 144 mg/dL — ABNORMAL HIGH (ref 70–99)
Glucose-Capillary: 137 mg/dL — ABNORMAL HIGH (ref 70–99)
Glucose-Capillary: 137 mg/dL — ABNORMAL HIGH (ref 70–99)
Glucose-Capillary: 131 mg/dL — ABNORMAL HIGH (ref 70–99)
Glucose-Capillary: 136 mg/dL — ABNORMAL HIGH (ref 70–99)

## 2018-08-20 MED ORDER — LORATADINE 10 MG PO TABS
10.0000 mg | ORAL_TABLET | Freq: Every day | ORAL | Status: DC
  Administered 2018-08-20 – 2018-08-30 (×11): 10 mg via ORAL
  Filled 2018-08-20 (×11): qty 1

## 2018-08-20 MED ORDER — FLUTICASONE PROPIONATE 50 MCG/ACT NA SUSP
2.0000 | Freq: Every day | NASAL | Status: DC
  Administered 2018-08-20 – 2018-08-30 (×11): 2 via NASAL
  Filled 2018-08-20: qty 16

## 2018-08-20 NOTE — Progress Notes (Signed)
Home CPAP patient states no assistance needed  

## 2018-08-20 NOTE — Progress Notes (Signed)
[place] PHYSICAL MEDICINE & REHABILITATION PROGRESS NOTE  Subjective/Complaints: Pt having congestion and fullness in ears. Feels dizzy when he gets up or changes positions in bed. Denies vertigo-like symptoms  ROS: Patient denies fever, rash, sore throat, blurred vision, nausea, vomiting, diarrhea, cough, shortness of breath or chest pain, joint or back pain, headache, or mood change.   Objective: Vital Signs: Blood pressure 122/64, pulse 70, temperature 98.5 F (36.9 C), temperature source Oral, resp. rate 13, height  (1.88 m), weight (!) 226.9 kg, SpO2 96 %. No results found. No results for input(s): WBC, HGB, HCT, PLT in the last 72 hours. No results for input(s): NA, K, CL, CO2, GLUCOSE, BUN, CREATININE, CALCIUM in the last 72 hours.  Physical Exam: BP 122/64 (BP Location: Right Wrist)   Pulse 70   Temp 98.5 F (36.9 C) (Oral)   Resp 13   Ht  (1.88 m)   Wt (!) 226.9 kg   SpO2 96%   BMI 64.22 kg/m  Constitutional: No distress . Vital signs reviewed. Morbidly obese HEENT: EOMI, oral membranes moist Neck: supple Cardiovascular: RRR without murmur. No JVD    Respiratory: CTA Bilaterally without wheezes or rales. Normal effort    GI: BS +, non-tender, non-distended  Musc: Bilateral lower edema and tenderness  Neurological: He is alert and oriented. No obvious nystagmus. Motor: Bilateral upper extremities: 5/5 proximal to distal Right lower extremity: Hip flexion 3+/5, Knee extension locked in brace, ankle dorsiflexion 5/5, unchanged Left lower extremity: Hip flexion 3+/5, knee extension locked in brace, ankle dorsiflexion 5/5, unchanged Skin: Bilateral Bledsoe braces in place with incisions C/D/I  Psychiatric: pleasant  Assessment/Plan: 1. 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  Physiatrist is providing close team  supervision and 24 hour management of active medical problems listed below.  Physiatrist and rehab team continue to assess barriers to discharge/monitor patient progress toward functional and medical goals  Care Tool:  Bathing    Body parts bathed by patient: Right arm, Left arm, Chest, Abdomen, Face   Body parts bathed by helper: Front perineal area, Buttocks, Right lower leg, Left lower leg, Left upper leg, Right upper leg Body parts n/a: Right arm, Left arm, Chest, Abdomen, Left lower leg, Right lower leg, Left upper leg, Right upper leg(Did not attempt secondary to pt washing last night)   Bathing assist Assist Level: Moderate Assistance - Patient 50 - 74%     Upper Body Dressing/Undressing Upper body dressing   What is the patient wearing?: Pull over shirt    Upper body assist Assist Level: Supervision/Verbal cueing    Lower Body Dressing/Undressing Lower body dressing      What is the patient wearing?: Pants     Lower body assist Assist for lower body dressing: Maximal Assistance - Patient 25 - 49%     Toileting Toileting    Toileting assist Assist for toileting: Dependent - Patient 0%     Transfers Chair/bed transfer  Transfers assist  Chair/bed transfer activity did not occur: Safety/medical concerns  Chair/bed transfer assist level: Contact Guard/Touching assist     Locomotion Ambulation   Ambulation assist   Ambulation activity did not occur: Safety/medical concerns  Assist level: Contact Guard/Touching assist Assistive device: Walker-rolling Max distance: 15   Walk 10 feet activity   Assist  Walk 10 feet activity did not occur: Safety/medical concerns  Assist level: Contact  Guard/Touching assist Assistive device: Walker-rolling   Walk 50 feet activity   Assist Walk 50 feet with 2 turns activity did not occur: Safety/medical concerns         Walk 150 feet activity   Assist Walk 150 feet activity did not occur: Safety/medical  concerns         Walk 10 feet on uneven surface  activity   Assist Walk 10 feet on uneven surfaces activity did not occur: Safety/medical concerns         Wheelchair     Assist   Type of Wheelchair: Manual Wheelchair activity did not occur: Safety/medical concerns  Wheelchair assist level: Supervision/Verbal cueing Max wheelchair distance: 136ft    Wheelchair 50 feet with 2 turns activity    Assist    Wheelchair 50 feet with 2 turns activity did not occur: Safety/medical concerns   Assist Level: Supervision/Verbal cueing   Wheelchair 150 feet activity     Assist Wheelchair 150 feet activity did not occur: Safety/medical concerns   Assist Level: Supervision/Verbal cueing      Medical Problem List and Plan: 1.  Decreased functional mobility secondary to bilateral quadricep tendon rupture. Status post repair of bilateral tendon rupture repair 07/21/2018. Weightbearing as tolerated. Bilateral Bledsoe brace locked in extension             Continue CIR PT, OT 2.  Antithrombotics: -DVT/anticoagulation:  Subcutaneous Lovenox 110 mg daily.   Lower extremity vascular ultrasound limited, but negative             -antiplatelet therapy: Not applicable 3. Pain Management:  Lidoderm patch  Wean oxycontin to qhs, DC'd on 3/5  Oxycodone immediate release for breakthrough pain  Controlled on 3/14 4. Mood:  Zoloft 25 mg daily. Provide emotional support  Appreciate Neuropsych consult- discussed with provider previously             -antipsychotic agents:  Not applicable 5. Neuropsych: This patient is capable of making decisions on his own behalf. 6. Skin/Wound Care: Stage I pressure ulcer. Follow-up wound care nurse Routine skin checks 7. Fluids/Electrolytes/Nutrition:  Routine interval mouth with follow-up  BMP within acceptable range on 3/9  Labs ordered for Monday 8. Acute blood loss anemia. Transfused 1 unit packed red blood cells postoperatively.   Hemoglobin  10.2 on 3/10  Labs ordered for Monday  Continue to monitor 9.  Super super obesity. Follow-up dietary services 10. New findingsType 2 diabetes mellitus. Hemoglobin A1c 7.1.  Check blood sugars before meals and at bedtime. Diabetic teaching  Monitor with increased mobility CBG (last 3)  Recent Labs    08/19/18 2059 08/20/18 0629 08/20/18 0850  GLUCAP 167* 144* 137*   Glucophage 500 mg daily, increased to 850 on 3/10  Improving control 3/15 11. Acute otitis media. Augmentin 875-125 mg initiated 07/30/2018. 12. Constipation. Laxative assistance 13. Asthma/OSA/tobacco/ marijuana abuse. CPAP as directed.Continue Symbicort as well as nebulizer treatments. Provide counseling regards to tobacco abuse  Dulera DC'd per patient on 3/10 14.  Hypoalbuminemia  Supplement initiated on 2/26, DC'd on 3/4 per patient 15.  Labile blood pressure   Vitals:   08/19/18 1929 08/20/18 0532  BP: (!) 151/80 122/64  Pulse: 71 70  Resp: 18 13  Temp: 98.5 F (36.9 C) 98.5 F (36.9 C)  SpO2: 95% 96%   Controlled on 3/15 16.  Sleep disturbance  Restoril started on 2/27  Trazodone decreased to 50 on 3/11  Improved 17. Sore throat  Chloraseptic spray ordered 18.  Pruritus  Benadryl cream ordered on 3/2, now patient states ineffective  Oral Benadryl PRN ordered, increased on 3/13 19.  Congestion in ears: feels that he still has fluid in ears  Debrox ordered  Will add claritin and flonase for nasal congestion  -may help some of positional "dizziness" he's complaining of. Apparently he's had a vestibular evaluation already  LOS: 19 days A FACE TO FACE EVALUATION WAS PERFORMED  Ranelle Oyster 08/20/2018, 9:52 AM

## 2018-08-20 NOTE — Plan of Care (Signed)
  Problem: RH BOWEL ELIMINATION Goal: RH STG MANAGE BOWEL WITH ASSISTANCE Description STG Manage Bowel with Min Assistance.  Outcome: Progressing Goal: RH STG MANAGE BOWEL W/MEDICATION W/ASSISTANCE Description STG Manage Bowel with Medication with Min Assistance.  Outcome: Progressing   Problem: RH BLADDER ELIMINATION Goal: RH STG MANAGE BLADDER WITH ASSISTANCE Description STG Manage Bladder With Min Assistance  Outcome: Progressing   Problem: RH SKIN INTEGRITY Goal: RH STG SKIN FREE OF INFECTION/BREAKDOWN Description Patients skin will be free from breakdown and inspected every shift.  Outcome: Progressing Goal: RH STG MAINTAIN SKIN INTEGRITY WITH ASSISTANCE Description STG Maintain Skin Integrity With Min Assistance.  Outcome: Progressing Goal: RH STG ABLE TO PERFORM INCISION/WOUND CARE W/ASSISTANCE Description STG Able To Perform Incision/Wound Care With Assistance. Outcome: Progressing   Problem: RH SAFETY Goal: RH STG ADHERE TO SAFETY PRECAUTIONS W/ASSISTANCE/DEVICE Description STG Adhere to Safety Precautions With Min Assistance/Device.  Outcome: Progressing   Problem: RH PAIN MANAGEMENT Goal: RH STG PAIN MANAGED AT OR BELOW PT'S PAIN GOAL Description Patient will be pain free or pain less than 3 during admission  Outcome: Progressing   Problem: RH KNOWLEDGE DEFICIT GENERAL Goal: RH STG INCREASE KNOWLEDGE OF SELF CARE AFTER HOSPITALIZATION Description Min assist  Outcome: Progressing

## 2018-08-20 NOTE — Progress Notes (Signed)
Home CPAP patient states no assistance needed

## 2018-08-21 ENCOUNTER — Inpatient Hospital Stay (HOSPITAL_COMMUNITY): Payer: Self-pay

## 2018-08-21 ENCOUNTER — Inpatient Hospital Stay (HOSPITAL_COMMUNITY): Payer: Self-pay | Admitting: Occupational Therapy

## 2018-08-21 LAB — CBC WITH DIFFERENTIAL/PLATELET
Abs Immature Granulocytes: 0.05 10*3/uL (ref 0.00–0.07)
Basophils Absolute: 0.1 10*3/uL (ref 0.0–0.1)
Basophils Relative: 1 %
EOS ABS: 0.2 10*3/uL (ref 0.0–0.5)
Eosinophils Relative: 2 %
HCT: 37.5 % — ABNORMAL LOW (ref 39.0–52.0)
Hemoglobin: 11.4 g/dL — ABNORMAL LOW (ref 13.0–17.0)
Immature Granulocytes: 1 %
Lymphocytes Relative: 26 %
Lymphs Abs: 2.6 10*3/uL (ref 0.7–4.0)
MCH: 29.5 pg (ref 26.0–34.0)
MCHC: 30.4 g/dL (ref 30.0–36.0)
MCV: 97.2 fL (ref 80.0–100.0)
Monocytes Absolute: 0.6 10*3/uL (ref 0.1–1.0)
Monocytes Relative: 6 %
Neutro Abs: 6.7 10*3/uL (ref 1.7–7.7)
Neutrophils Relative %: 64 %
Platelets: 181 10*3/uL (ref 150–400)
RBC: 3.86 MIL/uL — ABNORMAL LOW (ref 4.22–5.81)
RDW: 14.2 % (ref 11.5–15.5)
WBC: 10.3 10*3/uL (ref 4.0–10.5)
nRBC: 0 % (ref 0.0–0.2)

## 2018-08-21 LAB — BASIC METABOLIC PANEL
ANION GAP: 13 (ref 5–15)
BUN: 14 mg/dL (ref 6–20)
CO2: 22 mmol/L (ref 22–32)
Calcium: 9.4 mg/dL (ref 8.9–10.3)
Chloride: 104 mmol/L (ref 98–111)
Creatinine, Ser: 0.74 mg/dL (ref 0.61–1.24)
GFR calc Af Amer: >60 mL/min (ref >60)
GFR calc non Af Amer: >60 mL/min (ref >60)
Glucose, Bld: 116 mg/dL — ABNORMAL HIGH (ref 70–99)
Potassium: 4.3 mmol/L (ref 3.5–5.1)
Sodium: 139 mmol/L (ref 135–145)

## 2018-08-21 LAB — GLUCOSE, CAPILLARY
Glucose-Capillary: 152 mg/dL — ABNORMAL HIGH (ref 70–99)
Glucose-Capillary: 116 mg/dL — ABNORMAL HIGH (ref 70–99)
Glucose-Capillary: 100 mg/dL — ABNORMAL HIGH (ref 70–99)
Glucose-Capillary: 142 mg/dL — ABNORMAL HIGH (ref 70–99)

## 2018-08-21 NOTE — Progress Notes (Signed)
[facility] PHYSICAL MEDICINE & REHABILITATION PROGRESS NOTE  Subjective/Complaints: Patient seen sitting up this morning.  He states he slept well overnight.  He states he had a good weekend.  He states he had some congestion over the weekend and was started on Claritin.  ROS: Denies CP, shortness of breath, nausea, vomiting, diarrhea.  Objective: Vital Signs: Blood pressure 134/78, pulse 66, temperature 98 F (36.7 C), temperature source Oral, resp. rate 18, height 6\' 2"  (1.88 m), weight (!) 226.9 kg, SpO2 96 %. No results found. No results for input(s): WBC, HGB, HCT, PLT in the last 72 hours. No results for input(s): NA, K, CL, CO2, GLUCOSE, BUN, CREATININE, CALCIUM in the last 72 hours.  Physical Exam: BP 134/78 (BP Location: Right Arm)   Pulse 66   Temp 98 F (36.7 C) (Oral)   Resp 18   Ht 6\' 2"  (1.88 m)   Wt (!) 226.9 kg   SpO2 96%   BMI 64.22 kg/m  Constitutional: No distress . Vital signs reviewed. HENT: Normocephalic.  Atraumatic. Eyes: EOMI. No discharge. Cardiovascular: RRR. No JVD. Respiratory: CTA Bilaterally. Normal effort. GI: BS +. Non-distended. Musc: Bilateral edema and tenderness in knees, improving Neurological: He is alert and oriented.  Motor: Bilateral upper extremities: 5/5 proximal to distal Right lower extremity: Hip flexion 3+/5, Knee extension locked in brace, ankle dorsiflexion 5/5, stable Left lower extremity: Hip flexion 3+/5, knee extension locked in brace, ankle dorsiflexion 5/5, stable Skin: Bilateral Bledsoe braces in place with incisions C/D/I  Psychiatric: pleasant  Assessment/Plan: 1. 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  Physiatrist is providing close team supervision and 24 hour management of active medical problems listed below.  Physiatrist and rehab team continue to assess barriers  to discharge/monitor patient progress toward functional and medical goals  Care Tool:  Bathing    Body parts bathed by patient: Right arm, Left arm, Chest, Abdomen, Face   Body parts bathed by helper: Front perineal area, Buttocks, Right lower leg, Left lower leg, Left upper leg, Right upper leg Body parts n/a: Right arm, Left arm, Chest, Abdomen, Left lower leg, Right lower leg, Left upper leg, Right upper leg(Did not attempt secondary to pt washing last night)   Bathing assist Assist Level: Moderate Assistance - Patient 50 - 74%     Upper Body Dressing/Undressing Upper body dressing   What is the patient wearing?: Pull over shirt    Upper body assist Assist Level: Supervision/Verbal cueing    Lower Body Dressing/Undressing Lower body dressing      What is the patient wearing?: Pants     Lower body assist Assist for lower body dressing: Maximal Assistance - Patient 25 - 49%     Toileting Toileting    Toileting assist Assist for toileting: Total Assistance - Patient < 25%     Transfers Chair/bed transfer  Transfers assist  Chair/bed transfer activity did not occur: Safety/medical concerns  Chair/bed transfer assist level: Dependent - mechanical lift     Locomotion Ambulation   Ambulation assist   Ambulation activity did not occur: Safety/medical concerns  Assist level: Contact Guard/Touching assist Assistive device: Walker-rolling Max distance: 15   Walk 10 feet activity   Assist  Walk 10 feet activity did not occur: Safety/medical concerns  Assist level: Contact Guard/Touching assist Assistive device: Walker-rolling   Walk 50 feet activity   Assist Walk 50 feet  with 2 turns activity did not occur: Safety/medical concerns         Walk 150 feet activity   Assist Walk 150 feet activity did not occur: Safety/medical concerns         Walk 10 feet on uneven surface  activity   Assist Walk 10 feet on uneven surfaces activity did not  occur: Safety/medical concerns         Wheelchair     Assist   Type of Wheelchair: Manual Wheelchair activity did not occur: Safety/medical concerns  Wheelchair assist level: Supervision/Verbal cueing Max wheelchair distance: 148ft    Wheelchair 50 feet with 2 turns activity    Assist    Wheelchair 50 feet with 2 turns activity did not occur: Safety/medical concerns   Assist Level: Supervision/Verbal cueing   Wheelchair 150 feet activity     Assist Wheelchair 150 feet activity did not occur: Safety/medical concerns   Assist Level: Supervision/Verbal cueing      Medical Problem List and Plan: 1.  Decreased functional mobility secondary to bilateral quadricep tendon rupture. Status post repair of bilateral tendon rupture repair 07/21/2018. Weightbearing as tolerated. Bilateral Bledsoe brace locked in extension             Continue CIR  Weekend notes reviewed, labs reviewed 2.  Antithrombotics: DVT/anticoagulation:  Subcutaneous Lovenox 110 mg daily.   Lower extremity vascular ultrasound limited, but negative             -antiplatelet therapy: Not applicable 3. Pain Management:  Lidoderm patch  Wean oxycontin to qhs, DC'd on 3/5  Oxycodone immediate release for breakthrough pain  Controlled on 3/16 4. Mood:  Zoloft 25 mg daily. Provide emotional support  Appreciate Neuropsych consult- discussed with provider previously             -antipsychotic agents:  Not applicable 5. Neuropsych: This patient is capable of making decisions on his own behalf. 6. Skin/Wound Care: Stage I pressure ulcer. Follow-up wound care nurse Routine skin checks 7. Fluids/Electrolytes/Nutrition:  Routine interval mouth with follow-up  BMP within acceptable range on 3/9  Labs pending 8. Acute blood loss anemia. Transfused 1 unit packed red blood cells postoperatively.   Hemoglobin 10.2 on 3/10  Labs pending  Continue to monitor 9.  Super super obesity. Follow-up dietary  services 10. New findingsType 2 diabetes mellitus. Hemoglobin A1c 7.1.  Check blood sugars before meals and at bedtime. Diabetic teaching  Monitor with increased mobility CBG (last 3)  Recent Labs    08/20/18 1723 08/20/18 2123 08/21/18 0631  GLUCAP 131* 136* 152*   Glucophage 500 mg daily, increased to 850 on 3/10  Remains elevated, will consider further increase tomorrow if persistently elevated. 11. Acute otitis media. Augmentin 875-125 mg initiated 07/30/2018. 12. Constipation. Laxative assistance 13. Asthma/OSA/tobacco/ marijuana abuse. CPAP as directed.Continue Symbicort as well as nebulizer treatments. Provide counseling regards to tobacco abuse  Dulera DC'd per patient on 3/10 14.  Hypoalbuminemia  Supplement initiated on 2/26, DC'd on 3/4 per patient 15.  Labile blood pressure   Vitals:   08/20/18 1929 08/21/18 0457  BP: (!) 145/83 134/78  Pulse: 72 66  Resp: 18 18  Temp: 98.6 F (37 C) 98 F (36.7 C)  SpO2: 99% 96%   Relatively controlled on 3/16 16.  Sleep disturbance  Restoril started on 2/27  Trazodone decreased to 50 on 3/11  Improved 17. Sore throat  Chloraseptic spray ordered 18.  Pruritus  Benadryl cream ordered on 3/2, now patient states  ineffective  Oral Benadryl PRN ordered, increased on 3/13 19.  Congestion in ears:   Debrox ordered  Added Claritin and flonase for nasal congestion  LOS: 20 days A FACE TO FACE EVALUATION WAS PERFORMED  Carma Dwiggins Karis Juba 08/21/2018, 10:04 AM

## 2018-08-21 NOTE — Progress Notes (Signed)
Occupational Therapy Session Note  Patient Details  Name: Larry Fowler MRN: [id number] Date of Birth: 04-22-1971  Today's Date: 08/21/2018 OT Individual Time: 0388-8280 OT Individual Time Calculation (min): 73 min    Short Term Goals: Week 2:  OT Short Term Goal 1 (Week 2): Pt will tolerate sitting EOB for UB selfcare with supervision for 15 mins.   OT Short Term Goal 1 - Progress (Week 2): Met OT Short Term Goal 2 (Week 2): Pt will perform LB bathing supine to sit with mod assist.  OT Short Term Goal 2 - Progress (Week 2): Not met OT Short Term Goal 3 (Week 2): Pt will tolerate tilted bed up to 75 degrees for at least 2 mins in preparation for  OT Short Term Goal 3 - Progress (Week 2): Met OT Short Term Goal 4 (Week 2): Pt will use sockaide to donn gripper socks in sitting with setup only.   OT Short Term Goal 4 - Progress (Week 2): Not met OT Short Term Goal 5 (Week 2): Pt will perform stand pivot transfer to bariatric 3:1 with total assist +2 (pt 30%). OT Short Term Goal 5 - Progress (Week 2): Not met  Skilled Therapeutic Interventions/Progress Updates:    Pt worked on toilet transfer, toileting, bathing, and dressing during session.  Supervision for rolling in bed with use of the bed rails throughout session.  He needed use of the Maxi sky for transfer from the bed to the bariatric toilet with total assist and therapist supporting LEs.  Once on the toilet, BLEs were propped on chairs for support.  Total assist for toilet hygiene in the lift and in sidelying once in bed.  He completed all UB bathing and dressing with supervision.  Max assist for LB bathing and dressing to wash front and back peri area.  With total assist for donning shorts over his feel and pulling up over his hips.  Finished session with pt in the bed with call button in reach.    Therapy Documentation Precautions:  Precautions Precautions: Fall Precaution Comments: NO knee flexion Required Braces or Orthoses:  Other Brace Knee Immobilizer - Right: On at all times Knee Immobilizer - Left: On at all times Other Brace: Bilateral Bledsoe braces locked in extension Restrictions Weight Bearing Restrictions: No RLE Weight Bearing: Weight bearing as tolerated LLE Weight Bearing: Weight bearing as tolerated   Pain: Pain Assessment Pain Scale: Faces Faces Pain Scale: Hurts a little bit Pain Type: Surgical pain Pain Location: Knee Pain Orientation: Right;Left Pain Descriptors / Indicators: Discomfort Pain Onset: With Activity Pain Intervention(s): Medication (See eMAR);Repositioned ADL:  See Care Tool For some details of ADL   Therapy/Group: Individual Therapy  Dionte Blaustein OTR/L 08/21/2018, 10:37 AM

## 2018-08-21 NOTE — Progress Notes (Signed)
Physical Therapy Session Note  Patient Details  Name: Larry Fowler MRN: [id number] Date of Birth: [birth year]  Today's Date: 08/21/2018 PT Individual Time: 1050-1205 PT Individual Time Calculation (min): 75 min   Short Term Goals: Week 3:  PT Short Term Goal 1 (Week 3): Pt will perform SB to elevated surface with min assist  PT Short Term Goal 2 (Week 3): Pt will ambulate 52f with RW and supervision assist  PT Short Term Goal 3 (Week 3): Pt will perform sit<>stand from 25inch mat table with supervision assist   Skilled Therapeutic Interventions/Progress Updates: Pt presented in bed agreeable to therapy and indicating received pain meds. Pt expressed desire to attempt transfer to w/c or BSC. PTA checked heights with all being 21-23in. With increased time pt was able to perform supine to sit at EOB with supervision and without use of bed features. Pt performed STS from elevated bed supervision however stating too fatigued to ambulate at this moment. Pt then agreeable to have remainder of session focus on STS from decreased heights. [facility] [employer] Performed SB transfer to mat level surface with modA and increased time. Pt practiced STS from 25in, 24in, and 23in all with pt performing "rocking" to increase momentum and +2 to stabilize heavy duty walker. Pt indicating no significant increase in pain and but did require frequent repositioning of B Bledsoe braces. On final STS from 23in, pt was able to perform stand pivot to w/c (21in with 2in cushion) with CGA and verbal cues for hand placement. Pt then transported to day room and left with needs met.      Therapy Documentation Precautions:  Precautions Precautions: Fall Precaution Comments: NO knee flexion Required Braces or Orthoses: Other Brace Knee Immobilizer - Right: On at all times Knee Immobilizer - Left: On at all times Other Brace: Bilateral Bledsoe braces locked in  extension Restrictions Weight Bearing Restrictions: No RLE Weight Bearing: Weight bearing as tolerated LLE Weight Bearing: Weight bearing as tolerated General:   Vital Signs:   Pain: Pain Assessment Pain Scale: 0-10 Pain Score: 3  Faces Pain Scale: Hurts a little bit Pain Type: Surgical pain Pain Location: Knee Pain Orientation: Right;Left Pain Descriptors / Indicators: Aching Pain Frequency: Constant Pain Onset: On-going Patients Stated Pain Goal: 4 Pain Intervention(s): Medication (See eMAR) Mobility: Bed Mobility Bed Mobility: Rolling Right;Rolling Left Rolling Right: Supervision/verbal cueing Rolling Left: Supervision/Verbal cueing Transfers Transfers: Sit to Stand Sit to Stand: Supervision/Verbal cueing Transfer (Assistive device): Rolling walker(heavy duty) Locomotion :    Trunk/Postural Assessment :    Balance:   Exercises:   Other Treatments:      Therapy/Group: Individual Therapy  Satonya Lux 08/21/2018, 1:12 PM

## 2018-08-21 NOTE — Progress Notes (Signed)
Occupational Therapy Session Note  Patient Details  Name: Larry Fowler MRN: [id number] Date of Birth: [birth year]  Today's Date: 08/21/2018 OT Individual Time: 1402-1505 OT Individual Time Calculation (min): 63 min    Short Term Goals: Week 2:  OT Short Term Goal 1 (Week 2): Pt will tolerate sitting EOB for UB selfcare with supervision for 15 mins.   OT Short Term Goal 1 - Progress (Week 2): Met OT Short Term Goal 2 (Week 2): Pt will perform LB bathing supine to sit with mod assist.  OT Short Term Goal 2 - Progress (Week 2): Not met OT Short Term Goal 3 (Week 2): Pt will tolerate tilted bed up to 75 degrees for at least 2 mins in preparation for  OT Short Term Goal 3 - Progress (Week 2): Met OT Short Term Goal 4 (Week 2): Pt will use sockaide to donn gripper socks in sitting with setup only.   OT Short Term Goal 4 - Progress (Week 2): Not met OT Short Term Goal 5 (Week 2): Pt will perform stand pivot transfer to bariatric 3:1 with total assist +2 (pt 30%). OT Short Term Goal 5 - Progress (Week 2): Not met  Skilled Therapeutic Interventions/Progress Updates:    Pt worked on supine to sit and toilet transfers to the bariatric toilet with use of the bariatric walker during session.  He was able to transfer to the EOB with min guard assist and increased time.  Sit to stand from the elevated bed was completed with mod assist and then pivoted around to the toilet seat.  Total assist needed for managing clothing with max assist to transition to sitting from standing position.  Sit to stand from the toilet was completed with total assist +2 (pt 40%).  Finished session with transfer from the bariatric 3:1 to the wheelchair.  Pt left in the wheelchair at end of session with call button and phone in reach.  Bilateral knee braces needed frequent adjustment with any movement throughout session secondary to moving down below the knee.    Therapy Documentation Precautions:  Precautions Precautions:  Fall Precaution Comments: NO knee flexion Required Braces or Orthoses: Other Brace Knee Immobilizer - Right: On at all times Knee Immobilizer - Left: On at all times Other Brace: Bilateral Bledsoe braces locked in extension Restrictions Weight Bearing Restrictions: No RLE Weight Bearing: Weight bearing as tolerated LLE Weight Bearing: Weight bearing as tolerated   Pain: Pain Assessment Pain Scale: Faces Faces Pain Scale: Hurts little more Pain Type: Surgical pain Pain Location: Knee Pain Orientation: Right;Left Pain Descriptors / Indicators: Aching;Discomfort Pain Onset: With Activity Pain Intervention(s): Emotional support;Repositioned ADL: See Care Tool Section for some details of ADL  Therapy/Group: Individual Therapy  [name] OTR/L 08/21/2018, 3:24 PM

## 2018-08-22 ENCOUNTER — Inpatient Hospital Stay (HOSPITAL_COMMUNITY): Payer: Self-pay | Admitting: Occupational Therapy

## 2018-08-22 ENCOUNTER — Inpatient Hospital Stay (HOSPITAL_COMMUNITY): Payer: Self-pay | Admitting: Physical Therapy

## 2018-08-22 ENCOUNTER — Telehealth (INDEPENDENT_AMBULATORY_CARE_PROVIDER_SITE_OTHER): Payer: Self-pay | Admitting: Specialist

## 2018-08-22 LAB — GLUCOSE, CAPILLARY
Glucose-Capillary: 119 mg/dL — ABNORMAL HIGH (ref 70–99)
Glucose-Capillary: 137 mg/dL — ABNORMAL HIGH (ref 70–99)
Glucose-Capillary: 132 mg/dL — ABNORMAL HIGH (ref 70–99)
Glucose-Capillary: 108 mg/dL — ABNORMAL HIGH (ref 70–99)

## 2018-08-22 LAB — CBC
HEMATOCRIT: 34.2 % — AB (ref 39.0–52.0)
HEMOGLOBIN: 10.4 g/dL — AB (ref 13.0–17.0)
MCH: 29.5 pg (ref 26.0–34.0)
MCHC: 30.4 g/dL (ref 30.0–36.0)
MCV: 97.2 fL (ref 80.0–100.0)
Platelets: 170 10*3/uL (ref 150–400)
RBC: 3.52 MIL/uL — ABNORMAL LOW (ref 4.22–5.81)
RDW: 14.3 % (ref 11.5–15.5)
WBC: 7.9 10*3/uL (ref 4.0–10.5)
nRBC: 0 % (ref 0.0–0.2)

## 2018-08-22 NOTE — Progress Notes (Signed)
Nutrition Follow-up  DOCUMENTATION CODES:   Morbid obesity  INTERVENTION:   - Continue snacks daily  - Continue double protein portions with all meals  - Continue MVI with minerals daily  NUTRITION DIAGNOSIS:   Increased nutrient needs related to wound healing as evidenced by estimated needs.  Ongoing  GOAL:   Patient will meet greater than or equal to 90% of their needs  Progressing  MONITOR:   PO intake, Supplement acceptance, Skin, Labs  REASON FOR ASSESSMENT:   Consult Assessment of nutrition requirement/status, Wound healing  ASSESSMENT:   48 year old male with PMH of OSA-CPAP, tobacco abuse, anxiety/depression, asthma, and morbid obesity. Pt resented 07/20/18 after slipping and falling backwards with acute onset of bilateral knee pain. CT of the knees done revealing bilateral quad tendon rupture. Pt underwent repair on 07/21/18. Findings of elevated hemoglobin A1C of 7.1. Pt admitted to CIR on 2/25.  Attempted to speak with pt at bedside. Pt apparently asleep and had blankets pulled over his head. Reviewed chart. PO intake 100% at all recorded meals. RD took meal preferences in the past to facilitate pt receiving foods that he is willing to eat.  Meal Completion: 100%  Medications reviewed and include: SSI, Glucophage, MVI with minerals, Protonix, Senna  Labs reviewed. CBG's: 137, 119, 142, 100 x 24 hours  Diet Order:   Diet Order            Diet Carb Modified Fluid consistency: Thin; Room service appropriate? Yes  Diet effective now              EDUCATION NEEDS:   Education needs have been addressed  Skin:  Skin Assessment: Skin Integrity Issues: DTI: sacrum Stage II: Right & left buttocks, Left thigh Incisions: L knee, R knee  Last BM:  08/22/18 medium type 7  Height:   Ht Readings from Last 1 Encounters:  08/01/18 6\' 2"  (1.88 m)    Weight:   Wt Readings from Last 1 Encounters:  08/02/18 (!) 226.9 kg    Ideal Body Weight:  86.4  kg  BMI:  Body mass index is 64.22 kg/m.  Estimated Nutritional Needs:   Kcal:  2600-2800  Protein:  120-135 grams  Fluid:  >/= 2.4 L    Earma Reading, MS, RD, LDN Inpatient Clinical Dietitian Pager: [phone] Weekend/After Hours: [phone]

## 2018-08-22 NOTE — Progress Notes (Signed)
Occupational Therapy Session Note  Patient Details  Name: Larry Fowler MRN: [id number] Date of Birth: Sep 17, 1970  Today's Date: 08/22/2018 OT Individual Time: 0900-1003 OT Individual Time Calculation (min): 63 min    Short Term Goals: Week 2:  OT Short Term Goal 1 (Week 2): Pt will tolerate sitting EOB for UB selfcare with supervision for 15 mins.   OT Short Term Goal 1 - Progress (Week 2): Met OT Short Term Goal 2 (Week 2): Pt will perform LB bathing supine to sit with mod assist.  OT Short Term Goal 2 - Progress (Week 2): Not met OT Short Term Goal 3 (Week 2): Pt will tolerate tilted bed up to 75 degrees for at least 2 mins in preparation for  OT Short Term Goal 3 - Progress (Week 2): Met OT Short Term Goal 4 (Week 2): Pt will use sockaide to donn gripper socks in sitting with setup only.   OT Short Term Goal 4 - Progress (Week 2): Not met OT Short Term Goal 5 (Week 2): Pt will perform stand pivot transfer to bariatric 3:1 with total assist +2 (pt 30%). OT Short Term Goal 5 - Progress (Week 2): Not met  Skilled Therapeutic Interventions/Progress Updates:    Pt completed transfer from the bed to the bariatric toilet with total assist using the lift to start session.  He needed total assist for positioning and removal of the urinal as well as for toilet hygiene in the maxi sky as well as in the bed.  Total assist for washing his legs in supine with supervision for him to complete all UB bathing and dressing as well.  Pt left in bed at end of session with call button and phone in reach.    Therapy Documentation Precautions:  Precautions Precautions: Fall Precaution Comments: NO knee flexion Required Braces or Orthoses: Other Brace Knee Immobilizer - Right: On at all times Knee Immobilizer - Left: On at all times Other Brace: Bilateral Bledsoe braces locked in extension Restrictions Weight Bearing Restrictions: No RLE Weight Bearing: Weight bearing as tolerated LLE Weight  Bearing: Weight bearing as tolerated   Pain: Pain Assessment Pain Scale: 0-10 Pain Score: 7  Faces Pain Scale: Hurts a little bit Pain Type: Surgical pain Pain Location: Knee Pain Orientation: Right;Left Pain Descriptors / Indicators: Aching Pain Frequency: Constant Pain Onset: Progressive Patients Stated Pain Goal: 4 Pain Intervention(s): Medication (See eMAR) ADL: See Care Tool Section for some details of ADL  Therapy/Group: Individual Therapy  Javian Nudd OTR/L 08/22/2018, 12:36 PM

## 2018-08-22 NOTE — Progress Notes (Signed)
Physical Therapy Session Note  Patient Details  Name: Larry Fowler MRN: [id number] Date of Birth: [birth year]  Today's Date: 08/22/2018 PT Individual Time: 7829-5621 PT Individual Time Calculation (min): 85 min   Short Term Goals: Week 3:  PT Short Term Goal 1 (Week 3): Pt will perform SB to elevated surface with min assist  PT Short Term Goal 2 (Week 3): Pt will ambulate 79f with RW and supervision assist  PT Short Term Goal 3 (Week 3): Pt will perform sit<>stand from 25inch mat table with supervision assist   Skilled Therapeutic Interventions/Progress Updates: Pt presented in bed agreeable to therapy. Pt required time for re-adjustment of braces after each activity this session. Pt performed supine to sit with supervision HOB elevated and increased time. Performed STS with increased time, HOB elevated, and PTA blocking heavy duty walker. [facility] and verbal cues for properly aligning with BSC (-BM). Pt then [facility] (+2 providing continue forward momentum. Pt was then able to ambulate x 195fto w/c and performed stand to sit transfer with CGA and verbal cues for chair alignment. Pt c/o increased dizziness BP checked 138/74. Discussed with pt changing RW to black bari walker as would be more similar to what pt would use at home. Pt agreeable to trial. Pt transported to rehab gym and then indicated increased urgency for BM. Pt transported back to room and attempted STS transfer from w/c with black heavy duty RW. Due to pt's wide BOS pt with increased difficulty centering RW properly. Due to increased urgency resorted back to heavy duty walker however due to increased fatigue pt required modA x 2. Pt performed [facility]ulatory [facility] With increased time [facility] 1 and +1 stabilizing walker. Pt ambulated to bed and performed sit to supine to modA for BLE management. Pt required +2 for repositioning due to  increased dizziness once supine. Pt repositioned to comfort and left with call bell within reach and needs met.      Therapy Documentation Precautions:  Precautions Precautions: Fall Precaution Comments: NO knee flexion Required Braces or Orthoses: Other Brace Knee Immobilizer - Right: On at all times Knee Immobilizer - Left: On at all times Other Brace: Bilateral Bledsoe braces locked in extension Restrictions Weight Bearing Restrictions: No RLE Weight Bearing: Weight bearing as tolerated LLE Weight Bearing: Weight bearing as tolerated General:   Vital Signs:   Pain: Pain Assessment Pain Scale: 0-10 Pain Score: 7  Pain Type: Surgical pain Pain Location: Knee Pain Orientation: Right;Left Pain Descriptors / Indicators: Aching Pain Frequency: Constant Pain Onset: Progressive Patients Stated Pain Goal: 4 Pain Intervention(s): Medication (See eMAR)    Therapy/Group: Individual Therapy  Taher Vannote  Iaan Oregel, PTA  08/22/2018, 12:24 PM

## 2018-08-22 NOTE — Progress Notes (Signed)
Occupational Therapy Session Note  Patient Details  Name: Larry Fowler MRN: [id number] Date of Birth: 1971/01/12  Today's Date: 08/22/2018 OT Individual Time: 1445-1545 OT Individual Time Calculation (min): 60 min   Short Term Goals: Week 3:  OT Short Term Goal 1 (Week 3): Pt will complete sit to stand from bariatric 3:1 with total assist +2 (pt 40%). OT Short Term Goal 2 (Week 3): Pt will complete LB dressing with mod assist using AE supine to sit. OT Short Term Goal 3 (Week 3): Pt will perform stand pivot transfer to the bariatric 3:1 from elevated wheelchair with total assist +2 (pt 30%)  Skilled Therapeutic Interventions/Progress Updates:    Pt greeted semi-reclined in bed asleep, but agreeable to participate in therapy with encouragement. Second therapist present to assist with positioning and environment management as needed. Pt reported need to urinate and OT placed urinal for pt to void. Therapists then adjusted knee braces and donned shoes for patient. Pt came to sitting EOB with increased time, min A, and use of bed functions to ease transfer. Pt reported he felt he needed to have BM once seated EOB. Pt stood from raised bed with heavy bariatric RW and min A +2. Pt then ambulated short distance to [facility]. Pt needed assistance for clothing management, then sat on commode while each therapist placed a LE on a chair for support. Pt unable to have BM, only gas. Pt needed multiple trials to stand from [facility], but was eventually able to completed with min/mod A +2. Pt requests to return to bed and ambulated back to bed in similar fashion. Assistance to lift LLE back in the bed. OT provided pt with heating pad for L elbow and ice packs for B knees. Pt left semi-reclined in bed with needs met.   Therapy Documentation Precautions:  Precautions Precautions: Fall Precaution Comments: NO knee flexion Required Braces or Orthoses: Other Brace Knee Immobilizer - Right: On at all times Knee  Immobilizer - Left: On at all times Other Brace: Bilateral Bledsoe braces locked in extension Restrictions Weight Bearing Restrictions: No RLE Weight Bearing: Weight bearing as tolerated LLE Weight Bearing: Weight bearing as tolerated Pain: Pain Assessment Pain Scale: 0-10 Faces Pain Scale: Hurts a little bit Pain Location: Elbow Pain Orientation: Left Pain Descriptors / Indicators: Aching Pain Onset: On-going Pain Intervention(s): Heat applied   Therapy/Group: Individual Therapy  Valma Cava 08/22/2018, 4:24 PM

## 2018-08-22 NOTE — Progress Notes (Signed)
[place] PHYSICAL MEDICINE & REHABILITATION PROGRESS NOTE  Subjective/Complaints: Patient seen laying in bed this morning.  He states he slept well overnight.  He asks me to evaluate his bottom for pressure sore.  ROS: Denies CP, shortness of breath, nausea, vomiting, diarrhea.  Objective: Vital Signs: Blood pressure 128/64, pulse 64, temperature 97.6 F (36.4 C), temperature source Oral, resp. rate 18, height 6\' 2"  (1.88 m), weight (!) 226.9 kg, SpO2 98 %. No results found. Recent Labs    08/21/18 1521 08/22/18 0535  WBC 10.3 7.9  HGB 11.4* 10.4*  HCT 37.5* 34.2*  PLT 181 170   Recent Labs    08/21/18 1521  NA 139  K 4.3  CL 104  CO2 22  GLUCOSE 116*  BUN 14  CREATININE 0.74  CALCIUM 9.4    Physical Exam: BP 128/64 (BP Location: Right Arm)   Pulse 64   Temp 97.6 F (36.4 C) (Oral)   Resp 18   Ht 6\' 2"  (1.88 m)   Wt (!) 226.9 kg   SpO2 98%   BMI 64.22 kg/m  Constitutional: No distress . Vital signs reviewed. HENT: Normocephalic.  Atraumatic. Eyes: EOMI. No discharge. Cardiovascular: RRR.  No JVD. Respiratory: CTA bilaterally.  Normal effort. GI: BS +. Non-distended. Musc: Bilateral edema and tenderness in knees, improving Neurological: He is alert and oriented.  Motor: Bilateral upper extremities: 5/5 proximal to distal Right lower extremity: Hip flexion 3+/5, Knee extension locked in brace, ankle dorsiflexion 5/5, unchanged Left lower extremity: Hip flexion 3+/5, knee extension locked in brace, ankle dorsiflexion 5/5, unchanged Skin: Bilateral Bledsoe braces in place with incisions C/D/I  Psychiatric: pleasant  Assessment/Plan: 1. 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  Physiatrist is providing close team supervision and 24 hour management of active medical problems listed below.  Physiatrist and rehab  team continue to assess barriers to discharge/monitor patient progress toward functional and medical goals  Care Tool:  Bathing    Body parts bathed by patient: Right arm, Left arm, Chest, Abdomen   Body parts bathed by helper: Front perineal area, Buttocks Body parts n/a: Left upper leg, Right lower leg, Right upper leg, Left lower leg(Did not attempt this session)   Bathing assist Assist Level: Moderate Assistance - Patient 50 - 74%     Upper Body Dressing/Undressing Upper body dressing   What is the patient wearing?: Pull over shirt    Upper body assist Assist Level: Supervision/Verbal cueing    Lower Body Dressing/Undressing Lower body dressing      What is the patient wearing?: Pants     Lower body assist Assist for lower body dressing: Total Assistance - Patient < 25%     Toileting Toileting    Toileting assist Assist for toileting: 2 Helpers     Transfers Chair/bed transfer  Transfers assist  Chair/bed transfer activity did not occur: Safety/medical concerns  Chair/bed transfer assist level: Contact Guard/Touching assist(24in w/c )     Locomotion Ambulation   Ambulation assist   Ambulation activity did not occur: Safety/medical concerns  Assist level: Contact Guard/Touching assist Assistive device: Walker-rolling Max distance: 15   Walk 10 feet activity   Assist  Walk 10 feet activity did not occur: Safety/medical concerns  Assist level: Contact Guard/Touching assist Assistive device: Walker-rolling   Walk 50 feet activity   Assist Walk 50 feet with 2 turns activity did not  occur: Safety/medical concerns         Walk 150 feet activity   Assist Walk 150 feet activity did not occur: Safety/medical concerns         Walk 10 feet on uneven surface  activity   Assist Walk 10 feet on uneven surfaces activity did not occur: Safety/medical concerns         Wheelchair     Assist   Type of Wheelchair: Manual Wheelchair  activity did not occur: Safety/medical concerns  Wheelchair assist level: Supervision/Verbal cueing Max wheelchair distance: 147ft    Wheelchair 50 feet with 2 turns activity    Assist    Wheelchair 50 feet with 2 turns activity did not occur: Safety/medical concerns   Assist Level: Supervision/Verbal cueing   Wheelchair 150 feet activity     Assist Wheelchair 150 feet activity did not occur: Safety/medical concerns   Assist Level: Supervision/Verbal cueing      Medical Problem List and Plan: 1.  Decreased functional mobility secondary to bilateral quadricep tendon rupture. Status post repair of bilateral tendon rupture repair 07/21/2018. Weightbearing as tolerated. Bilateral Bledsoe brace locked in extension             Continue CIR 2.  Antithrombotics: DVT/anticoagulation:  Subcutaneous Lovenox 110 mg daily.   Lower extremity vascular ultrasound limited, but negative             -antiplatelet therapy: Not applicable 3. Pain Management:  Lidoderm patch  Wean oxycontin to qhs, DC'd on 3/5  Oxycodone immediate release for breakthrough pain  Controlled on 3/17 4. Mood:  Zoloft 25 mg daily. Provide emotional support  Appreciate Neuropsych consult- discussed with provider previously             -antipsychotic agents:  Not applicable 5. Neuropsych: This patient is capable of making decisions on his own behalf. 6. Skin/Wound Care: Stage I pressure ulcer. Follow-up wound care nurse Routine skin checks 7. Fluids/Electrolytes/Nutrition:  Routine interval mouth with follow-up  BMP within acceptable range on 3/16 8. Acute blood loss anemia. Transfused 1 unit packed red blood cells postoperatively.   Hemoglobin 10.4 on 3/17  Continue to monitor 9.  Super super obesity. Follow-up dietary services 10. New findingsType 2 diabetes mellitus. Hemoglobin A1c 7.1.  Check blood sugars before meals and at bedtime. Diabetic teaching  Monitor with increased mobility CBG (last 3)   Recent Labs    08/21/18 1725 08/21/18 2146 08/22/18 0649  GLUCAP 100* 142* 119*   Glucophage 500 mg daily, increased to 850 on 3/10  Relatively controlled on 3/17 11. Acute otitis media. Augmentin 875-125 mg initiated 07/30/2018. 12. Constipation. Laxative assistance 13. Asthma/OSA/tobacco/ marijuana abuse. CPAP as directed.Continue Symbicort as well as nebulizer treatments. Provide counseling regards to tobacco abuse  Dulera DC'd per patient on 3/10 14.  Hypoalbuminemia  Supplement initiated on 2/26, DC'd on 3/4 per patient 15.  Labile blood pressure   Vitals:   08/21/18 2049 08/22/18 0448  BP: 126/67 128/64  Pulse: 66 64  Resp: 12 18  Temp: 98.6 F (37 C) 97.6 F (36.4 C)  SpO2: 100% 98%   Relatively controlled on 3/17 16.  Sleep disturbance  Restoril started on 2/27  Trazodone decreased to 50 on 3/11  Improved 17. Sore throat  Chloraseptic spray ordered 18.  Pruritus  Benadryl cream ordered on 3/2, now patient states ineffective  Oral Benadryl PRN ordered, increased on 3/13 19.  Congestion in ears:   Debrox ordered  Added Claritin and flonase for nasal  congestion  LOS: 21 days A FACE TO FACE EVALUATION WAS PERFORMED  Clarrisa Kaylor Karis Juba 08/22/2018, 9:41 AM

## 2018-08-22 NOTE — Progress Notes (Signed)
Physical Therapy Session Note  Patient Details  Name: Larry Fowler MRN: [id number] Date of Birth: 17-Dec-1970  Today's Date: 08/22/2018 PT Individual Time: 2992-4268 PT Individual Time Calculation (min): 23 min   Short Term Goals: Week 3:  PT Short Term Goal 1 (Week 3): Pt will perform SB to elevated surface with min assist  PT Short Term Goal 2 (Week 3): Pt will ambulate 52ft with RW and supervision assist  PT Short Term Goal 3 (Week 3): Pt will perform sit<>stand from 25inch mat table with supervision assist   Skilled Therapeutic Interventions/Progress Updates:   pt requests to perform supine therex this session.  Pt performs AAROM 3 x 10 SLR, hip abd/add, add squeeze, glute squeeze.  Pt left in bed with all needs at hand.  Therapy Documentation Precautions:  Precautions Precautions: Fall Precaution Comments: NO knee flexion Required Braces or Orthoses: Other Brace Knee Immobilizer - Right: On at all times Knee Immobilizer - Left: On at all times Other Brace: Bilateral Bledsoe braces locked in extension Restrictions Weight Bearing Restrictions: No RLE Weight Bearing: Weight bearing as tolerated LLE Weight Bearing: Weight bearing as tolerated Pain: Pt with no c/o pain during session   Therapy/Group: Individual Therapy  Pierrette Scheu 08/22/2018, 1:23 PM

## 2018-08-22 NOTE — Telephone Encounter (Signed)
Message sent in error

## 2018-08-23 ENCOUNTER — Encounter (HOSPITAL_COMMUNITY): Payer: Self-pay | Admitting: Psychology

## 2018-08-23 ENCOUNTER — Inpatient Hospital Stay (HOSPITAL_COMMUNITY): Payer: Self-pay | Admitting: Physical Therapy

## 2018-08-23 ENCOUNTER — Inpatient Hospital Stay (HOSPITAL_COMMUNITY): Payer: Self-pay | Admitting: Occupational Therapy

## 2018-08-23 LAB — GLUCOSE, CAPILLARY
Glucose-Capillary: 136 mg/dL — ABNORMAL HIGH (ref 70–99)
Glucose-Capillary: 115 mg/dL — ABNORMAL HIGH (ref 70–99)
Glucose-Capillary: 120 mg/dL — ABNORMAL HIGH (ref 70–99)
Glucose-Capillary: 137 mg/dL — ABNORMAL HIGH (ref 70–99)

## 2018-08-23 MED ORDER — LIDOCAINE 5 % EX PTCH
1.0000 | MEDICATED_PATCH | Freq: Every day | CUTANEOUS | Status: DC | PRN
  Administered 2018-08-23: 1 via TRANSDERMAL
  Filled 2018-08-23: qty 1

## 2018-08-23 NOTE — Progress Notes (Signed)
Occupational Therapy Session Note  Patient Details  Name: RIGEL FILSINGER MRN: [id number] Date of Birth: 1970-12-27  Today's Date: 08/23/2018 OT Individual Time: 0901-1002 OT Individual Time Calculation (min): 61 min    Short Term Goals: Week 2:  OT Short Term Goal 1 (Week 2): Pt will tolerate sitting EOB for UB selfcare with supervision for 15 mins.   OT Short Term Goal 1 - Progress (Week 2): Met OT Short Term Goal 2 (Week 2): Pt will perform LB bathing supine to sit with mod assist.  OT Short Term Goal 2 - Progress (Week 2): Not met OT Short Term Goal 3 (Week 2): Pt will tolerate tilted bed up to 75 degrees for at least 2 mins in preparation for  OT Short Term Goal 3 - Progress (Week 2): Met OT Short Term Goal 4 (Week 2): Pt will use sockaide to donn gripper socks in sitting with setup only.   OT Short Term Goal 4 - Progress (Week 2): Not met OT Short Term Goal 5 (Week 2): Pt will perform stand pivot transfer to bariatric 3:1 with total assist +2 (pt 30%). OT Short Term Goal 5 - Progress (Week 2): Not met  Skilled Therapeutic Interventions/Progress Updates:    Pt completed transfer from supine to sit EOB with min assist after therapist tightened his hinged knee braces.  He completed sit to stand from the elevated bed with mod assist in order to pivot to the bariatric toilet.  Once he stood however the braces again slid down his LEs.  He was able to complete UB bathing with setup on the toilet and washed his upper legs while there as well.  Therapist assisted with re-applying his braces and then he donned pullover shirt with supervision.  Therapist provided total assist for donning shorts over feet.  Total assist +2 (pt 60%) for transfer off of the toilet and for toilet hygiene and clothing management.  He transferred from the toilet to the wheelchair to complete session.  Pt left in chair with call button and phone in reach.    Therapy Documentation Precautions:   Precautions Precautions: Fall Precaution Comments: NO knee flexion Required Braces or Orthoses: Other Brace Knee Immobilizer - Right: On at all times Knee Immobilizer - Left: On at all times Other Brace: Bilateral Bledsoe braces locked in extension Restrictions Weight Bearing Restrictions: No RLE Weight Bearing: Weight bearing as tolerated LLE Weight Bearing: Weight bearing as tolerated  Pain: Pain Assessment Pain Scale: 0-10 Pain Score: 7  Pain Location: Knee Pain Orientation: Right;Left Pain Descriptors / Indicators: Aching Pain Frequency: Intermittent Pain Onset: On-going Patients Stated Pain Goal: 0 Pain Intervention(s): Medication (See eMAR);Repositioned Multiple Pain Sites: No ADL: See Care Tool for some details of ADL  Therapy/Group: Individual Therapy  Taleeyah Bora OTR/L 08/23/2018, 11:17 AM

## 2018-08-23 NOTE — Progress Notes (Signed)
Occupational Therapy Session Note  Patient Details  Name: Larry Fowler MRN: [id number] Date of Birth: 09-07-1970  Today's Date: 08/23/2018 OT Individual Time: 8329-1916 OT Individual Time Calculation (min): 30 min     Skilled Therapeutic Interventions/Progress Updates:    Pt completed BUE strengthening exercises supine with HOB elevated during session.  He utilized 10 lb dumbbells for 2 sets of 10 repetitions for shoulder press.  He then used the blue heavy resistance therapy band for 1 set of 10 repetitions for shoulder horizontal abduction.  Blue band also used for 1 set of 15 reps shoulder row.  Pt with rest breaks in between exercises.  He was left in bed at end of session with call button and phone in reach.    Therapy Documentation Precautions:  Precautions Precautions: Fall Precaution Comments: NO knee flexion Required Braces or Orthoses: Other Brace Knee Immobilizer - Right: On at all times Knee Immobilizer - Left: On at all times Other Brace: Bilateral Bledsoe braces locked in extension Restrictions Weight Bearing Restrictions: No RLE Weight Bearing: Weight bearing as tolerated LLE Weight Bearing: Weight bearing as tolerated  Pain: Pain Assessment Pain Scale: Faces Faces Pain Scale: Hurts a little bit Pain Type: Surgical pain Pain Location: Knee Pain Orientation: Right;Left Pain Descriptors / Indicators: Aching Pain Onset: With Activity  Therapy/Group: Individual Therapy  Laqueshia Cihlar OTR/L 08/23/2018, 3:56 PM

## 2018-08-23 NOTE — Progress Notes (Signed)
RT offered pt CPAP for the night and pt declined. RT will continue to monitor. 

## 2018-08-23 NOTE — Consult Note (Signed)
Neuropsychological Consultation   Patient:   Larry Fowler   DOB:   [birth year]  MR Number:  [id number]  Location:  [facility] [address] CENTER A [address] [id number] [place] [address] Dept: [phone] Loc: [phone]           Date of Service:   08/23/2018  Start Time:   8 AM End Time:   9 AM  Provider/Observer:  Arley Phenix, Psy.D.       Clinical Neuropsychologist       Billing Code/Service: 96158/96159  Chief Complaint:    Larry Fowler is a 48 year old right-handed male with a history of obstructive sleep apnea with CPAP use.  The patient also history of tobacco abuse, anxiety and depression, asthma, super morbid obesity with a BMI of 64.  The patient presented on 07/20/2018 after slipping and falling backwards with acute onset of bilateral knee pain.  CT of the knees done revealed bilateral quad tendon rupture and underwent repair on 07/21/2018 by Dr. Roda Shutters.  Postoperative weightbearing was tolerated with knee braces locked in extension.  There have been issues of ongoing pain management and the patient has had times of significant agitation and reports significant symptoms of depression and anxiety.  08/23/2018:  The patient reports that his depression has improved over initial visit.  He was bright and in good mood today and has been actively working in therapies and making progress.  Reports worry that he may be discharged before ready.  Reason for Service:  The patient was referred for neuropsychological consultation due to adjusting and coping with the extended hospital stay.  The patient has had times of emotional distress and agitation with a pre-existing history of depression anxiety.  Below is the HPI for the current admission.  HPI: Larry Fowler is a 48 year old right handed male with history of OSA-CPAP,tobacco abuse, anxiety/depression, asthma, super morbid obesity with BMI 64. Per report and  patient, patient lives with spouse. 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 One level home with 2 steps to entry. Presented 07/20/2018 after slipping and falling backwards with acute onset of bilateral knee pain. CT of the knees done revealing bilateral quad tendon rupture and underwent repair on 07/21/2018 by Dr.Xu. Postoperative weightbearing as tolerated with bilateral Bledsoe knee braces is locked in extension. Ongoing issues of pain management currently maintained on scheduled OxyContin as well as oxycodone immediate release for breakthrough pain. Acute blood loss anemia 7.8 was transfused 1 unit packed red blood cells with latest hemoglobin maintaining stable at 7.8.patient had been on subcutaneous .Currently maintained on Lovenox for DVT prophylaxis. Findings of elevated hemoglobin A1c of 7.1 and blood sugars monitored. Placed on Augmentin after patient spiked low-grade fevers findings of acute otitis media.Therapy evaluations completed with recommendations of physical medicine rehabilitation consult. Patient was admitted for a comprehensive rehabilitation program.  Current Status:  The patient was in much improved mood today and reports that that is also his experience.  He is now 2.5 weeks into starting Zoloft and does not report any subjective side effects.    Behavioral Observation: Larry Fowler  presents as a 48 y.o.-year-old Right Caucasian Male who appeared his stated age. his dress was Appropriate and he was Well Groomed and his manners were Appropriate to the situation.  his participation was indicative of Appropriate and Attentive behaviors.  There were any physical disabilities noted.  he displayed an appropriate level of cooperation and motivation.  Interactions:    Active Appropriate and Attentive  Attention:   within normal limits and attention span and concentration were age appropriate  Memory:   within normal limits; recent and remote memory  intact  Visuo-spatial:  not examined  Speech (Volume):  normal  Speech:   normal; normal  Thought Process:  Coherent and Relevant  Though Content:  WNL; not suicidal and not homicidal  Orientation:   person, place, time/date and situation  Judgment:   Fair  Planning:   Fair  Affect:    Anxious  Mood:    Anxious  Insight:   Good and Fair  Intelligence:   normal  Medical History:   Past Medical History:  Diagnosis Date  . ALLERGIC RHINITIS 09/12/2007   Qualifier: Diagnosis of  By: Jonny Ruiz MD, Len Blalock   . ANXIETY 02/16/2007   Qualifier: Diagnosis of  By: Tyrone Apple, Lucy    . ASTHMA 12/17/2009   Qualifier: Diagnosis of  By: Jonny Ruiz MD, Len Blalock   . Cannabis abuse    currently  . Chills with fever   . DEPRESSION 09/12/2007   Qualifier: Diagnosis of  By: Jonny Ruiz MD, Len Blalock   . History of cocaine use    in his 32's  . Hyperglycemia   . Rectal fistula 2009  . Rectal pain   . Renal stone    2008    Psychiatric History:  The patient does have a history of anxiety and depressive type symptoms in the past.  These have been documented in his medical chart and the patient does report an exacerbation of depression anxiety symptoms.  He has been started on Zoloft during his hospital course.  The patient reports some hesitancy to taking the medication just to make him feel happy.  This medication was reviewed and mechanism of action was explained to the patient to help him better understand what is tried to be achieved with the SSRI medication.  Family Med/Psych History:  Family History  Problem Relation Age of Onset  . Diabetes type II Mother   . Pneumonia Father     Risk of Suicide/Violence: virtually non-existent the patient denies any suicidal or homicidal ideation.  Impression/DX:  Larry Fowler is a 48 year old right-handed male with a history of obstructive sleep apnea with CPAP use.  The patient also history of tobacco abuse, anxiety and depression, asthma, super morbid obesity  with a BMI of 64.  The patient presented on 07/20/2018 after slipping and falling backwards with acute onset of bilateral knee pain.  CT of the knees done revealed bilateral quad tendon rupture and underwent repair on 07/21/2018 by Dr. Roda Shutters.  Postoperative weightbearing was tolerated with knee braces locked in extension.  There have been issues of ongoing pain management and the patient has had times of significant agitation and reports significant symptoms of depression and anxiety.  The patient was at times today quite emotional with extended periods of crying.  The patient acknowledged that he has had times of agitation and stress with the extended hospital stay but does acknowledge that he knows that he needs to be on the rehab unit now as he knows that he is not able to do things that are needed and his wife is not able to do things that are needed for his care.  The patient describes stress with not being able to be around his house, his family, or his dog.  The patient reports that pain has been better but he has had difficulty tolerating  physical movement and reports that most of this has to do with fear of a bad outcome.  The patient does have a history of anxiety and depressive type symptoms in the past.  These have been documented in his medical chart and the patient does report an exacerbation of depression anxiety symptoms.  He has been started on Zoloft during his hospital course.  The patient reports some hesitancy to taking the medication just to make him feel happy.  This medication was reviewed and mechanism of action was explained to the patient to help him better understand what is tried to be achieved with the SSRI medication.  08/23/2018:  The patient reports that his depression has improved over initial visit.  He was bright and in good mood today and has been actively working in therapies and making progress.  Reports worry that he may be discharged before ready.  The patient was in much  improved mood today and reports that that is also his experience.  He is now 2.5 weeks into starting Zoloft and does not report any subjective side effects.    Disposition/Plan:  Today we continue to work on coping and adjustment strategies around extended hospital stay and significant reduction in physical activity/function.  The patient's mood was much improved over the first 2 visits I have had with the patient and he was well oriented.  Therapy notes suggest that the patient continues to progressively increase his effort levels and the patient reports that he remains motivated for improvement.  The patient reports that his pain is been improving.  I will follow-up with the patient if needed before his discharge.   Diagnosis:    Bilateral quad rupture         Electronically Signed   _______________________ Arley Phenix, Psy.D.

## 2018-08-23 NOTE — Progress Notes (Signed)
Physical Therapy Session Note  Patient Details  Name: Larry Fowler MRN: [id number] Date of Birth: May 26, 1971  Today's Date: 08/23/2018 PT Individual Time: 1115-1200 PT Individual Time Calculation (min): 45 min   Short Term Goals: Week 3:  PT Short Term Goal 1 (Week 3): Pt will perform SB to elevated surface with min assist  PT Short Term Goal 2 (Week 3): Pt will ambulate 32f with RW and supervision assist  PT Short Term Goal 3 (Week 3): Pt will perform sit<>stand from 25inch mat table with supervision assist   Skilled Therapeutic Interventions/Progress Updates: [facility]. Pt required minA x1 for STS with second person blocking [facility] Pt then able to maintain standing x 3 min while PTA and NT adjusted Blesdoe braces. Due to increased fatigued w[facility]c [facility] performed stand pivot to w/c CGA and verbal cues for alignment. Pt then propelled to rehab gym and participated in BUE strengthening exercises as noted below. Discussed with primary PT poor fitting braces, pt able to contact [employer] rep and is able to come to next session to look at braces. Pt then transported back to room at end of session and remained in w/c with call bell within reach and needs met.    All exercises performed to fatigue (approx 15 ea)  Performed with yellow weighted ball Chest press Shoulder flexion Triceps extension (push ball to ceiling)  Catch with physioball scap retraction with 5lb dowel and blue resistance band  Shoulder circles with 5lb dowel  Attempted chair push ups with pt able to push elbows into extension however unable to lift body due to habitus      Therapy Documentation Precautions:  Precautions Precautions: Fall Precaution Comments: NO knee flexion Required Braces or Orthoses: Other Brace Knee Immobilizer - Right: On at all times Knee Immobilizer - Left: On at all times Other Brace: Bilateral Bledsoe  braces locked in extension Restrictions Weight Bearing Restrictions: Yes RLE Weight Bearing: Weight bearing as tolerated LLE Weight Bearing: Weight bearing as tolerated General: PT Amount of Missed Time (min): 15 Minutes PT Missed Treatment Reason: Not applicable(mtg) Vital Signs: Therapy Vitals Temp: 98.2 F (36.8 C) Pulse Rate: 72 Resp: 16 BP: 119/87 Patient Position (if appropriate): Sitting Oxygen Therapy SpO2: 100 % O2 Device: Room Air Pain: Pain Assessment Pain Scale: Faces Faces Pain Scale: Hurts a little bit Pain Type: Surgical pain Pain Location: Knee Pain Orientation: Right;Left Pain Descriptors / Indicators: Aching Pain Onset: With Activity    Therapy/Group: Individual Therapy  Terren Jandreau  Bianney Rockwood, PTA  08/23/2018, 4:56 PM

## 2018-08-23 NOTE — Progress Notes (Signed)
[place] PHYSICAL MEDICINE & REHABILITATION PROGRESS NOTE  Subjective/Complaints: Patient seen laying in bed this morning.  He states he slept well overnight.  Wife and patient with several questions regarding Lidoderm, hernia, pressure sore, constipation/diarrhea, etc.  Patient initially stated that he had diarrhea yesterday, now states that he is constipated.  ROS: Denies CP, shortness of breath, nausea, vomiting, diarrhea.  Objective: Vital Signs: Blood pressure 129/64, pulse (!) 57, temperature 98.3 F (36.8 C), temperature source Oral, resp. rate 18, height 6\' 2"  (1.88 m), weight (!) 227.1 kg, SpO2 100 %. No results found. Recent Labs    08/21/18 1521 08/22/18 0535  WBC 10.3 7.9  HGB 11.4* 10.4*  HCT 37.5* 34.2*  PLT 181 170   Recent Labs    08/21/18 1521  NA 139  K 4.3  CL 104  CO2 22  GLUCOSE 116*  BUN 14  CREATININE 0.74  CALCIUM 9.4    Physical Exam: BP 129/64 (BP Location: Right Arm)   Pulse (!) 57   Temp 98.3 F (36.8 C) (Oral)   Resp 18   Ht 6\' 2"  (1.88 m)   Wt (!) 227.1 kg   SpO2 100%   BMI 64.28 kg/m  Constitutional: No distress . Vital signs reviewed.  Morbidly obese. HENT: Normocephalic.  Atraumatic. Eyes: EOMI. No discharge. Cardiovascular: RRR.  No JVD. Respiratory: CTA bilaterally.  Normal effort. GI: BS +. Non-distended. Musc: Bilateral edema and tenderness in knees, improving Neurological: He is alert and oriented.  Motor: Right lower extremity: Hip flexion 3+/5, Knee extension locked in brace, ankle dorsiflexion 5/5, stable Left lower extremity: Hip flexion 3+/5, knee extension locked in brace, ankle dorsiflexion 5/5, stable Skin: Bilateral Bledsoe braces in place with incisions C/D/I  Psychiatric: pleasant  Assessment/Plan: 1. 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  Physiatrist is  providing close team supervision and 24 hour management of active medical problems listed below.  Physiatrist and rehab team continue to assess barriers to discharge/monitor patient progress toward functional and medical goals  Care Tool:  Bathing    Body parts bathed by patient: Right arm, Left arm, Chest, Abdomen, Face   Body parts bathed by helper: Front perineal area, Buttocks, Right upper leg, Left upper leg, Right lower leg, Left lower leg Body parts n/a: Left upper leg, Right lower leg, Right upper leg, Left lower leg(Did not attempt this session)   Bathing assist Assist Level: Moderate Assistance - Patient 50 - 74%     Upper Body Dressing/Undressing Upper body dressing   What is the patient wearing?: Pull over shirt    Upper body assist Assist Level: Supervision/Verbal cueing    Lower Body Dressing/Undressing Lower body dressing      What is the patient wearing?: Pants     Lower body assist Assist for lower body dressing: Total Assistance - Patient < 25%     Toileting Toileting    Toileting assist Assist for toileting: Dependent - Patient 0%     Transfers Chair/bed transfer  Transfers assist  Chair/bed transfer activity did not occur: Safety/medical concerns  Chair/bed transfer assist level: Contact Guard/Touching assist(24in w/c )     Locomotion Ambulation   Ambulation assist   Ambulation activity did not occur: Safety/medical concerns  Assist level: Contact Guard/Touching assist Assistive device: Walker-rolling Max distance: 15   Walk 10 feet activity   Assist  Walk 10 feet activity did  not occur: Safety/medical concerns  Assist level: Contact Guard/Touching assist Assistive device: Walker-rolling   Walk 50 feet activity   Assist Walk 50 feet with 2 turns activity did not occur: Safety/medical concerns         Walk 150 feet activity   Assist Walk 150 feet activity did not occur: Safety/medical concerns         Walk 10  feet on uneven surface  activity   Assist Walk 10 feet on uneven surfaces activity did not occur: Safety/medical concerns         Wheelchair     Assist   Type of Wheelchair: Manual Wheelchair activity did not occur: Safety/medical concerns  Wheelchair assist level: Supervision/Verbal cueing Max wheelchair distance: 128ft    Wheelchair 50 feet with 2 turns activity    Assist    Wheelchair 50 feet with 2 turns activity did not occur: Safety/medical concerns   Assist Level: Supervision/Verbal cueing   Wheelchair 150 feet activity     Assist Wheelchair 150 feet activity did not occur: Safety/medical concerns   Assist Level: Supervision/Verbal cueing      Medical Problem List and Plan: 1.  Decreased functional mobility secondary to bilateral quadricep tendon rupture. Status post repair of bilateral tendon rupture repair 07/21/2018. Weightbearing as tolerated. Bilateral Bledsoe brace locked in extension             Continue CIR  Team conference today to discuss current and goals and coordination of care, home and environmental barriers, and discharge planning with nursing, case manager, and therapies.  2.  Antithrombotics: DVT/anticoagulation:  Subcutaneous Lovenox 110 mg daily.   Lower extremity vascular ultrasound limited, but negative             -antiplatelet therapy: Not applicable 3. Pain Management:  Lidoderm patch  Wean oxycontin to qhs, DC'd on 3/5  Oxycodone immediate release for breakthrough pain  Lidoderm changed to as needed per patient/wife request  Controlled on 3/18 4. Mood:  Zoloft 25 mg daily. Provide emotional support  Appreciate Neuropsych consult- discussed with provider previously             -antipsychotic agents:  Not applicable 5. Neuropsych: This patient is capable of making decisions on his own behalf. 6. Skin/Wound Care: Stage I pressure ulcer. Follow-up wound care nurse Routine skin checks  Continue pressure relief 7.  Fluids/Electrolytes/Nutrition:  Routine interval mouth with follow-up  BMP within acceptable range on 3/16 8. Acute blood loss anemia. Transfused 1 unit packed red blood cells postoperatively.   Hemoglobin 10.4 on 3/17  Continue to monitor 9.  Super super obesity. Follow-up dietary services 10. New findingsType 2 diabetes mellitus. Hemoglobin A1c 7.1.  Check blood sugars before meals and at bedtime. Diabetic teaching  Monitor with increased mobility CBG (last 3)  Recent Labs    08/22/18 1655 08/22/18 2101 08/23/18 0631  GLUCAP 132* 108* 136*   Glucophage 500 mg daily, increased to 850 on 3/10  Overall controlled on 3/18 11. Acute otitis media. Augmentin 875-125 mg initiated 07/30/2018. 12. Constipation. Laxative assistance 13. Asthma/OSA/tobacco/ marijuana abuse. CPAP as directed.Continue Symbicort as well as nebulizer treatments. Provide counseling regards to tobacco abuse  Dulera DC'd per patient on 3/10 14.  Hypoalbuminemia  Supplement initiated on 2/26, DC'd on 3/4 per patient 15.  Labile blood pressure   Vitals:   08/22/18 1924 08/23/18 0434  BP: 137/76 129/64  Pulse: 62 (!) 57  Resp: 18 18  Temp: 98.7 F (37.1 C) 98.3 F (36.8 C)  SpO2: 96% 100%   Controlled on 3/18 16.  Sleep disturbance  Restoril started on 2/27  Trazodone decreased to 50 on 3/11  Improved 17. Sore throat  Chloraseptic spray ordered 18.  Pruritus  Benadryl cream ordered on 3/2, now patient states ineffective  Oral Benadryl PRN ordered, increased on 3/13 19.  Congestion in ears:   Debrox ordered  Added Claritin and flonase for nasal congestion  LOS: 22 days A FACE TO FACE EVALUATION WAS PERFORMED   Karis Juba 08/23/2018, 10:08 AM

## 2018-08-23 NOTE — Progress Notes (Signed)
Physical Therapy Session Note  Patient Details  Name: Larry Fowler MRN: [id number] Date of Birth: 1970/08/14  Today's Date: 08/23/2018 PT Individual Time: 1300-1330 PT Individual Time Calculation (min): 30 min   Short Term Goals: Week 3:  PT Short Term Goal 1 (Week 3): Pt will perform SB to elevated surface with min assist  PT Short Term Goal 2 (Week 3): Pt will ambulate 109ft with RW and supervision assist  PT Short Term Goal 3 (Week 3): Pt will perform sit<>stand from 25inch mat table with supervision assist   Skilled Therapeutic Interventions/Progress Updates:   Pt received sitting in [facility] and agreeable to PT  Orthotist present for Bil bledsoe brace assessment. Orthotist recommends heel attachment to bledsoe brace to prevent slipping down LE and provide improve stability in standing. Stand pivot transfer to toilet from [name] with mod assist and UE support on RW. Pt required multiple attempts to power up into standing and assist to utilize momentum. Max cues for UE placement and problem solving for set up and posture.   Pt left on toilet with call bel lin reach and NT aware of pt position.      Therapy Documentation Precautions:  Precautions Precautions: Fall Precaution Comments: NO knee flexion Required Braces or Orthoses: Other Brace Knee Immobilizer - Right: On at all times Knee Immobilizer - Left: On at all times Other Brace: Bilateral Bledsoe braces locked in extension Restrictions Weight Bearing Restrictions: Yes RLE Weight Bearing: Weight bearing as tolerated LLE Weight Bearing: Weight bearing as tolerated Pain: Pain Assessment Pain Scale: Faces Faces Pain Scale: Hurts a little bit Pain Type: Surgical pain Pain Location: Knee Pain Orientation: Right;Left Pain Descriptors / Indicators: Aching Pain Onset: With Activity    Therapy/Group: Individual Therapy  [name] 08/23/2018, 6:02 PM

## 2018-08-24 ENCOUNTER — Inpatient Hospital Stay (HOSPITAL_COMMUNITY): Payer: Self-pay | Admitting: Occupational Therapy

## 2018-08-24 ENCOUNTER — Inpatient Hospital Stay (HOSPITAL_COMMUNITY): Payer: Self-pay | Admitting: Physical Therapy

## 2018-08-24 LAB — GLUCOSE, CAPILLARY
GLUCOSE-CAPILLARY: 124 mg/dL — AB (ref 70–99)
Glucose-Capillary: 134 mg/dL — ABNORMAL HIGH (ref 70–99)
Glucose-Capillary: 122 mg/dL — ABNORMAL HIGH (ref 70–99)
Glucose-Capillary: 127 mg/dL — ABNORMAL HIGH (ref 70–99)

## 2018-08-24 NOTE — Progress Notes (Signed)
Occupational Therapy Session Note  Patient Details  Name: Larry Fowler MRN: [id number] Date of Birth: [birth year]  Today's Date: 08/24/2018 OT Individual Time: 1404-1500 OT Individual Time Calculation (min): 56 min    Skilled Therapeutic Interventions/Progress Updates:  Pt completed toilet transfers and toileting from bed to bariatric toilet and then back to the bed during session.  Min assist for transition to the EOB with min assist for stand pivot transfer to the toilet.  Pt's braces continue to fall down every time he transfers or transitions.  Mod assist for pushing shorts down once at the toilet with min assist to transition to sitting.  LEs were propped on chairs with urinal placed by therapist as well.  He needed max assist for toilet hygiene with min assist for sit to stand from the elevated toilet.  Finished session with return to the bed with max assist for cleaning front peri area in bed at end of session.  Min assist for sit to supine as well to bring LEs into the bed.  Pt left with lunch in reach and call button and phone in place with NT present as well as SW.   Therapy Documentation Precautions:  Precautions Precautions: Fall Precaution Comments: NO knee flexion Required Braces or Orthoses: Other Brace Knee Immobilizer - Right: On at all times Knee Immobilizer - Left: On at all times Other Brace: Bilateral Bledsoe braces locked in extension Restrictions Weight Bearing Restrictions: No RLE Weight Bearing: Weight bearing as tolerated LLE Weight Bearing: Weight bearing as tolerated  Pain: Pain Assessment Pain Scale: 0-10 Pain Score: 5  Pain Type: Chronic pain Pain Location: Knee Pain Orientation: Right;Left Pain Descriptors / Indicators: Aching;Discomfort Pain Frequency: Intermittent Pain Onset: With Activity Pain Intervention(s): Medication (See eMAR);Distraction ADL: See Care Tool for some details of ADL Therapy/Group: Individual Therapy  Acadia Thammavong  OTR/L 08/24/2018, 4:01 PM

## 2018-08-24 NOTE — Progress Notes (Signed)
Occupational Therapy Session Note  Patient Details  Name: Larry Fowler MRN: [id number] Date of Birth: 27-Jan-1971  Today's Date: 08/24/2018 OT Individual Time: 1620-1650 OT Individual Time Calculation (min): 30 min     Skilled Therapeutic Interventions/Progress Updates:    Fabricated adaptive urinal from splint material for pt to be able to complete toileting with modified independence from bed level as well as for use of urinal on the bariatric toilet.  Issued to pt to try later today and will adjust as needed.    Therapy Documentation Precautions:  Precautions Precautions: Fall Precaution Comments: NO knee flexion Required Braces or Orthoses: Other Brace Knee Immobilizer - Right: On at all times Knee Immobilizer - Left: On at all times Other Brace: Bilateral Bledsoe braces locked in extension Restrictions Weight Bearing Restrictions: No RLE Weight Bearing: Weight bearing as tolerated LLE Weight Bearing: Weight bearing as tolerated  Pain: Pain Assessment Pain Score: 5  ADL: Therapy/Group: Individual Therapy  Kehinde Totzke OTR/L 08/24/2018, 5:45 PM

## 2018-08-24 NOTE — Progress Notes (Signed)
[place] PHYSICAL MEDICINE & REHABILITATION PROGRESS NOTE  Subjective/Complaints: Was restless last night. Mentioned that he hasn't moved bowels much after having loose stool a few days ago. Wanted to know results of recent labs.   ROS: Patient denies fever, rash, sore throat, blurred vision, nausea, vomiting, diarrhea, cough, shortness of breath or chest pain,  headache, or mood change.    Objective: Vital Signs: Blood pressure 121/66, pulse 69, temperature 98.7 F (37.1 C), temperature source Oral, resp. rate 16, height 6\' 2"  (1.88 m), weight (!) 227.1 kg, SpO2 99 %. No results found. Recent Labs    08/21/18 1521 08/22/18 0535  WBC 10.3 7.9  HGB 11.4* 10.4*  HCT 37.5* 34.2*  PLT 181 170   Recent Labs    08/21/18 1521  NA 139  K 4.3  CL 104  CO2 22  GLUCOSE 116*  BUN 14  CREATININE 0.74  CALCIUM 9.4    Physical Exam: BP 121/66 (BP Location: Right Arm)   Pulse 69   Temp 98.7 F (37.1 C) (Oral)   Resp 16   Ht 6\' 2"  (1.88 m)   Wt (!) 227.1 kg   SpO2 99%   BMI 64.28 kg/m  Constitutional: No distress . Vital signs reviewed. HEENT: EOMI, oral membranes moist Neck: supple Cardiovascular: RRR without murmur. No JVD    Respiratory: CTA Bilaterally without wheezes or rales. Normal effort    GI: BS +, non-tender, non-distended  Musc: Bilateral edema and tenderness in knees, improving Neurological: He is alert and oriented.  Motor: Right lower extremity: Hip flexion 3+/5, Knee extension locked in brace, ankle dorsiflexion 5/5, stable Left lower extremity: Hip flexion 3+/5, knee extension locked in brace, ankle dorsiflexion 5/5, stable Skin: bilateral knee incisions CDI Psychiatric: pleasant  Assessment/Plan: 1. 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  Physiatrist is providing close team supervision and 24 hour  management of active medical problems listed below.  Physiatrist and rehab team continue to assess barriers to discharge/monitor patient progress toward functional and medical goals  Care Tool:  Bathing    Body parts bathed by patient: Right arm, Left arm, Chest, Abdomen, Face   Body parts bathed by helper: Front perineal area, Buttocks, Right upper leg, Left upper leg, Right lower leg, Left lower leg Body parts n/a: Left upper leg, Right lower leg, Right upper leg, Left lower leg(Did not attempt this session)   Bathing assist Assist Level: Moderate Assistance - Patient 50 - 74%     Upper Body Dressing/Undressing Upper body dressing   What is the patient wearing?: Pull over shirt    Upper body assist Assist Level: Supervision/Verbal cueing    Lower Body Dressing/Undressing Lower body dressing      What is the patient wearing?: Pants     Lower body assist Assist for lower body dressing: Total Assistance - Patient < 25%     Toileting Toileting    Toileting assist Assist for toileting: 2 Helpers     Transfers Chair/bed transfer  Transfers assist  Chair/bed transfer activity did not occur: Safety/medical concerns  Chair/bed transfer assist level: Moderate Assistance - Patient 50 - 74%     Locomotion Ambulation   Ambulation assist   Ambulation activity did not occur: Safety/medical concerns  Assist level: Contact Guard/Touching assist Assistive device: Walker-rolling Max distance: 50   Walk 10 feet activity   Assist  Walk 10 feet  activity did not occur: Safety/medical concerns  Assist level: Supervision/Verbal cueing Assistive device: Walker-rolling   Walk 50 feet activity   Assist Walk 50 feet with 2 turns activity did not occur: Safety/medical concerns  Assist level: Contact Guard/Touching assist      Walk 150 feet activity   Assist Walk 150 feet activity did not occur: Safety/medical concerns         Walk 10 feet on uneven surface   activity   Assist Walk 10 feet on uneven surfaces activity did not occur: Safety/medical concerns         Wheelchair     Assist   Type of Wheelchair: Manual Wheelchair activity did not occur: Safety/medical concerns  Wheelchair assist level: Supervision/Verbal cueing Max wheelchair distance: 150    Wheelchair 50 feet with 2 turns activity    Assist    Wheelchair 50 feet with 2 turns activity did not occur: Safety/medical concerns   Assist Level: Supervision/Verbal cueing   Wheelchair 150 feet activity     Assist Wheelchair 150 feet activity did not occur: Safety/medical concerns   Assist Level: Supervision/Verbal cueing      Medical Problem List and Plan: 1.  Decreased functional mobility secondary to bilateral quadricep tendon rupture. Status post repair of bilateral tendon rupture repair 07/21/2018. Weightbearing as tolerated. Bilateral Bledsoe brace locked in extension             Continue CIR PT, OT 2.  Antithrombotics: DVT/anticoagulation:  Subcutaneous Lovenox 110 mg daily.   Lower extremity vascular ultrasound limited, but negative             -antiplatelet therapy: Not applicable 3. Pain Management:  Lidoderm patch  Wean oxycontin to qhs, DC'd on 3/5  Oxycodone immediate release for breakthrough pain  Lidoderm changed to as needed per patient/wife request  Controlled on 3/19 4. Mood:  Zoloft 25 mg daily. Provide emotional support  Appreciate Neuropsych consult- discussed with provider previously             -antipsychotic agents:  Not applicable 5. Neuropsych: This patient is capable of making decisions on his own behalf. 6. Skin/Wound Care: Stage I pressure ulcer. Follow-up wound care nurse Routine skin checks  Continue pressure relief 7. Fluids/Electrolytes/Nutrition:  Routine interval mouth with follow-up  BMP within acceptable range on 3/16 8. Acute blood loss anemia. Transfused 1 unit packed red blood cells postoperatively.    Hemoglobin 10.4 on 3/17  Continue to monitor 9.  Super super obesity. Follow-up dietary services 10. New findingsType 2 diabetes mellitus. Hemoglobin A1c 7.1.  Check blood sugars before meals and at bedtime. Diabetic teaching  Monitor with increased mobility CBG (last 3)  Recent Labs    08/23/18 1644 08/23/18 2140 08/24/18 0630  GLUCAP 120* 137* 134*   Glucophage 500 mg daily, increased to 850 on 3/10  Overall controlled on 3/19 11. Acute otitis media. Augmentin 875-125 mg initiated 07/30/2018. 12. Constipation. Continue senokot-s bid  -had large BM today 13. Asthma/OSA/tobacco/ marijuana abuse. CPAP as directed.Continue Symbicort as well as nebulizer treatments. Provide counseling regards to tobacco abuse  Dulera DC'd per patient on 3/10 14.  Hypoalbuminemia  Supplement initiated on 2/26, DC'd on 3/4 per patient 15.  Labile blood pressure   Vitals:   08/23/18 1945 08/24/18 0416  BP: (!) 123/46 121/66  Pulse: 75 69  Resp: 18 16  Temp: 99 F (37.2 C) 98.7 F (37.1 C)  SpO2: 97% 99%   Controlled on 3/19 16.  Sleep disturbance  Restoril  started on 2/27  Trazodone decreased to 50 on 3/11  Improved 17. Sore throat  Chloraseptic spray ordered 18.  Pruritus  Benadryl cream ordered on 3/2, now patient states ineffective  Oral Benadryl PRN ordered, increased on 3/13 19.  Congestion in ears:   Debrox ordered  Added Claritin and flonase for nasal congestion  LOS: 23 days A FACE TO FACE EVALUATION WAS PERFORMED  Ranelle Oyster 08/24/2018, 12:31 PM

## 2018-08-24 NOTE — Progress Notes (Signed)
Physical Therapy Session Note  Patient Details  Name: Larry Fowler MRN: [id number] Date of Birth: 16-Feb-1971  Today's Date: 08/24/2018 PT Individual Time: 1105-1200 PT Individual Time Calculation (min): 55 min   Short Term Goals: Week 3:  PT Short Term Goal 1 (Week 3): Pt will perform SB to elevated surface with min assist  PT Short Term Goal 2 (Week 3): Pt will ambulate 40f with RW and supervision assist  PT Short Term Goal 3 (Week 3): Pt will perform sit<>stand from 25inch mat table with supervision assist   Skilled Therapeutic Interventions/Progress Updates:   Pt received sitting in WC and agreeable to PT. WC mobility x 150 + 120 with supervision assist form PT and min cues for doorway management and safety with brake management.   Sit<>stand transfer training from WC(22in) and mat table. [facility]. CGA assist form mat table. Cues for RW management with use of heavy duty RW. Pt reports feeling unsafe with heavy duty and returned to use of bariatric.   Gait training with bariatric RW x 566fand supervision assist. 1 standing rest break to to tighten straps on bledsoe braces. Min cues for posture and safety in turn to sit.   UBE backwards x 6 min for trap, lat, endurance/strength training and forward x 3 min . Level 6. Patient returned to [facility] and [facility] in reach and all needs met.         Therapy Documentation Precautions:  Precautions Precautions: Fall Precaution Comments: NO knee flexion Required Braces or Orthoses: Other Brace Knee Immobilizer - Right: On at all times Knee Immobilizer - Left: On at all times Other Brace: Bilateral Bledsoe braces locked in extension Restrictions Weight Bearing Restrictions: No RLE Weight Bearing: Weight bearing as tolerated LLE Weight Bearing: Weight bearing as tolerated    Pain: Denies at rest    Therapy/Group: Individual Therapy  [name]/19/2020, 12:14 PM

## 2018-08-24 NOTE — Progress Notes (Signed)
Pt's wife removed bilateral leg braces to massage and rub pt's legs. Pt c/o left shoulder pain given ice pack per request and 5mg  OxyContin (did not want full dose). Wife with pt. Pt resting in bed with 2 SR up call light in reach.

## 2018-08-24 NOTE — Progress Notes (Signed)
Occupational Therapy Session Note  Patient Details  Name: Larry Fowler MRN: [id number] Date of Birth: 28-Sep-1970  Today's Date: 08/24/2018 OT Individual Time: 0904-1003 OT Individual Time Calculation (min): 59 min    Short Term Goals: Week 2:  OT Short Term Goal 1 (Week 2): Pt will tolerate sitting EOB for UB selfcare with supervision for 15 mins.   OT Short Term Goal 1 - Progress (Week 2): Met OT Short Term Goal 2 (Week 2): Pt will perform LB bathing supine to sit with mod assist.  OT Short Term Goal 2 - Progress (Week 2): Not met OT Short Term Goal 3 (Week 2): Pt will tolerate tilted bed up to 75 degrees for at least 2 mins in preparation for  OT Short Term Goal 3 - Progress (Week 2): Met OT Short Term Goal 4 (Week 2): Pt will use sockaide to donn gripper socks in sitting with setup only.   OT Short Term Goal 4 - Progress (Week 2): Not met OT Short Term Goal 5 (Week 2): Pt will perform stand pivot transfer to bariatric 3:1 with total assist +2 (pt 30%). OT Short Term Goal 5 - Progress (Week 2): Not met  Skilled Therapeutic Interventions/Progress Updates:    Pt completed sit to stand from the bariatric toilet to start session with max assist.  Once standing, he needed total assist for toilet hygiene with min assist for stand pivot transfer to the bariatric wheelchair.  Once in the chair, had pt work on bathing tasks sit to stand.  He completed all UB bathing with setup.  LB bathing completed with mod assist in sitting only.  Therapist removed bilateral LE braces, washed his feet and then replaced them in order for pt to transfer back to the bariatric toilet.  He was able to complete sit to stand from the wheelchair with total assist with wheelchair height 22 inches.  Finished session with pt on the bariatric toilet with call button and phone in reach.  BLEs elevated in chairs.    Therapy Documentation Precautions:  Precautions Precautions: Fall Precaution Comments: NO knee  flexion Required Braces or Orthoses: Other Brace Knee Immobilizer - Right: On at all times Knee Immobilizer - Left: On at all times Other Brace: Bilateral Bledsoe braces locked in extension Restrictions Weight Bearing Restrictions: No RLE Weight Bearing: Weight bearing as tolerated LLE Weight Bearing: Weight bearing as tolerated   Pain: Pain Assessment Pain Scale: Faces Faces Pain Scale: Hurts a little bit Pain Type: Chronic pain Pain Location: Knee Pain Orientation: Right;Left Pain Descriptors / Indicators: Discomfort Pain Onset: With Activity Pain Intervention(s): Repositioned ADL: See Care Tool Section for some details of ADL  Therapy/Group: Individual Therapy  Oscar Forman OTR/L 08/24/2018, 12:37 PM

## 2018-08-25 ENCOUNTER — Inpatient Hospital Stay (HOSPITAL_COMMUNITY): Payer: Self-pay | Admitting: Physical Therapy

## 2018-08-25 ENCOUNTER — Inpatient Hospital Stay (HOSPITAL_COMMUNITY): Payer: Self-pay

## 2018-08-25 ENCOUNTER — Inpatient Hospital Stay (HOSPITAL_COMMUNITY): Payer: Self-pay | Admitting: Occupational Therapy

## 2018-08-25 LAB — GLUCOSE, CAPILLARY
Glucose-Capillary: 128 mg/dL — ABNORMAL HIGH (ref 70–99)
Glucose-Capillary: 117 mg/dL — ABNORMAL HIGH (ref 70–99)
Glucose-Capillary: 109 mg/dL — ABNORMAL HIGH (ref 70–99)
Glucose-Capillary: 147 mg/dL — ABNORMAL HIGH (ref 70–99)

## 2018-08-25 NOTE — Patient Care Conference (Signed)
Inpatient RehabilitationTeam Conference and Plan of Care Update Date: 08/23/2018   Time: 2:30 PM    Patient Name: Larry Fowler      Medical Record Number: [id number]  Date of Birth: 14-Sep-1970 Sex: Male         Room/Bed: 4W15C/4W15C-01 Payor Info: Payor: Long EMPLOYEE / Plan: [place] [employer] / Product Type: *No Product type* /    Admitting Diagnosis: BQUAD PENDON RUPURES  Admit Date/Time:  08/01/2018  3:58 PM Admission Comments: No comment available   Primary Diagnosis:  <principal problem not specified> Principal Problem: <principal problem not specified>  Patient Active Problem List   Diagnosis Date Noted  . Congestion of both ears   . Pruritus   . Episode of recurrent major depressive disorder (HCC)   . Labile blood glucose   . Sore throat   . Sleep disturbance   . Labile blood pressure   . Hypoalbuminemia due to protein-calorie malnutrition (HCC)   . New onset type 2 diabetes mellitus (HCC)   . Marijuana abuse   . Drug induced constipation   . Otitis media   . Acute blood loss anemia   . Pressure injury of skin 07/30/2018  . Anxiety and depression   . Super-super obese (HCC)   . Postoperative pain   . Tobacco abuse   . Postoperative anemia due to acute blood loss 07/23/2018    Class: Acute  . Rupture of left quadriceps muscle   . Quadriceps tendon rupture 07/20/2018  . Nutritional counseling 07/03/2018  . Disorder of tendon of left biceps 12/05/2017  . Hypogonadism in male 10/25/2017  . Hyperglycemia   . Preventative health care 03/21/2017  . Morbid obesity (HCC) 03/21/2017  . OSA (obstructive sleep apnea) 03/21/2017  . Chronic pain 03/21/2017  . Smoker 03/21/2017  . HTN (hypertension) 03/21/2017  . Fatigue 03/21/2017  . Left inguinal hernia 03/21/2017  . Rash 03/21/2017  . History of cocaine use   . Cannabis abuse   . Renal stone   . Anal abscess, ? fistula 04/05/2011  . Asthma 12/17/2009  . 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  DEPRESSION 09/12/2007  . Allergic rhinitis 09/12/2007  . ANXIETY 02/16/2007    Expected Discharge Date: Expected Discharge Date: 08/30/18  Team Members Present: Physician leading conference: Dr. Maryla Morrow Social Worker Present: Amada Jupiter, LCSW Nurse Present: Allayne Stack, RN PT Present: Grier Rocher, PT OT Present: Blanch Media, OT PPS Coordinator present : Fae Pippin     Current Status/Progress Goal Weekly Team Focus  Medical   Decreased functional mobility secondary to bilateral quadricep tendon rupture. Status post repair of bilateral tendon rupture repair 07/21/2018.  Improve mobility, endurance, self-care  See above   Bowel/Bladder   Continent of bladder, continent and incontinent at times of Bowels  remain continent of b/b  Assess toileting needs QS and PRN   Swallow/Nutrition/ Hydration             ADL's   supervision for UB selfcare with max assist to total for LB selfcare in supine.  Total assist for toileting tasks with progression of total +2 (pt 50%) for stand pivot toilet transfers with use of the RW for support.  He continues to be limited with sit to stand and needs increased height for all surfaces to complete this.   supervision for UB selfcare with min assist for most other tasks  selfcare retraining, balance retraining, transfer training, therapeutic exercise, pt/family education   Mobility   supervision rolling L/R, min/modA  sit to supine, supine to sit supervision with increased time. STS CGA from >25in, min/modA 23/24in, gait up to 60ft with heavy duty walker  Mod assist transfers and from [name]. gait with RW up to 10-15 ft with mod assist adn Bil KI.   transfers from lower surface 23/24in, increasing gait and endurance   Communication             Safety/Cognition/ Behavioral Observations            Pain   Pain controled on Oxycodone Q 4hrs as needed  Pain level maintain <3  Assess QS and PRN   Skin   Stage 2-sacral area healing continue  Gerhardts cream as ordered and prn  Remain free of sddtional skin breakdown/infections  QS assessment and prn monitoring, [employer] Bri[employer] Equipment    Rehab Goals Patient on target to meet rehab goals: Yes *See Care Plan and progress notes for long and short-term goals.     Barriers to Discharge  Current Status/Progress Possible Resolutions Date Resolved   Physician    Medical stability;Weight;Decreased caregiver support;Lack of/limited family support;Other (comments)  Super obese  See above  Therapies      Nursing                  PT                    OT                  SLP                SW                Discharge Planning/Teaching Needs:  Pt to d/c home with wife as primary caregiver.      Team Discussion:  No medical issues.  Sit-stand from toilet now min/CGA and from w/c is mod assist.  Ortho now allowing 0-45 degree bend.  Making good progress this past week!  Still no ramp in place.  Planning to get a different style of brace.    Revisions to Treatment Plan:  NA    Continued Need for Acute Rehabilitation Level of Care: The patient requires daily medical management by a physician with specialized training in physical medicine and rehabilitation for the following conditions: Daily direction of a multidisciplinary physical rehabilitation program to ensure safe treatment while eliciting the highest outcome that is of practical value to the patient.: Yes Daily medical management of patient stability for increased activity during participation in an intensive rehabilitation regime.: Yes Daily analysis of laboratory values and/or radiology reports with any subsequent need for medication adjustment of medical intervention for : Post surgical problems;Other   I attest that I was present, lead the team conference, and concur with the assessment and plan of the team.   Chrsitopher Wik 08/25/2018, 11:30 AM

## 2018-08-25 NOTE — Progress Notes (Signed)
Social Work Patient ID: Norm Parcel, male   DOB: July 06, 1970, 48 y.o.   MRN: [id number]   Met with pt yesterday and today to review team conference.  Pt aware that we continue to plan toward a d/c of 3/25, however, he reports that ramp is still not in place and that it may not be in place until next weekend.  Discussed likely need for ambulance transfer home.  Pt feels that a stretcher could not get into the home because "there's too much stuff".  Stressed to him that we had talked about need for a ramp since prior to CIR admit [facility]) and that, if ramp is not able to be in place by discharge, then wife needs to make the home entry by stretcher possible.   Have ordered DME and hope to have on unit by Monday to check that it is appropriate for pt.  Hope to catch up with pt's wife this afternoon.  Kailin Principato, LCSW

## 2018-08-25 NOTE — Progress Notes (Signed)
[place] PHYSICAL MEDICINE & REHABILITATION PROGRESS NOTE  Subjective/Complaints: Patient seen laying in bed this morning.  He states he slept well overnight, but then's notes that he was "tossing and turning some".  He states his ramp will not be built until the next weekend.  ROS: Denies CP, SOB, N/V/D  Objective: Vital Signs: Blood pressure 121/68, pulse 68, temperature 97.8 F (36.6 C), temperature source Oral, resp. rate 19, height  (1.88 m), weight (!) 227.1 kg, SpO2 95 %. No results found. No results for input(s): WBC, HGB, HCT, PLT in the last 72 hours. No results for input(s): NA, K, CL, CO2, GLUCOSE, BUN, CREATININE, CALCIUM in the last 72 hours.  Physical Exam: BP 121/68 (BP Location: Right Wrist)   Pulse 68   Temp 97.8 F (36.6 C) (Oral)   Resp 19   Ht  (1.88 m)   Wt (!) 227.1 kg   SpO2 95%   BMI 64.28 kg/m  Constitutional: No distress . Vital signs reviewed. HENT: Normocephalic.  Atraumatic. Eyes: EOMI. No discharge. Cardiovascular: RRR. No JVD. Respiratory: CTA Bilaterally. Normal effort. GI: BS +. Non-distended. Musc: Bilateral edema and tenderness in knees, improving Neurological: He is alert and oriented.  Motor: Right lower extremity: Hip flexion 3+/5, Knee extension locked in brace, ankle dorsiflexion 5/5, unchanged Left lower extremity: Hip flexion 3+/5, knee extension locked in brace, ankle dorsiflexion 5/5, unchanged Skin: bilateral knee incisions CDI Psychiatric: pleasant  Assessment/Plan: 1. 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  Physiatrist is providing close team supervision and 24 hour management of active medical problems listed below.  Physiatrist and rehab team continue to assess barriers to discharge/monitor patient progress toward functional and medical goals  Care  Tool:  Bathing    Body parts bathed by patient: Right arm, Left arm, Abdomen, Face, Left upper leg, Right upper leg   Body parts bathed by helper: Chest, Front perineal area, Buttocks, Right lower leg, Left lower leg Body parts n/a: Left upper leg, Right lower leg, Right upper leg, Left lower leg(Did not attempt this session)   Bathing assist Assist Level: Moderate Assistance - Patient 50 - 74%     Upper Body Dressing/Undressing Upper body dressing   What is the patient wearing?: Pull over shirt    Upper body assist Assist Level: Supervision/Verbal cueing    Lower Body Dressing/Undressing Lower body dressing      What is the patient wearing?: Pants     Lower body assist Assist for lower body dressing: Total Assistance - Patient < 25%     Toileting Toileting    Toileting assist Assist for toileting: Maximal Assistance - Patient 25 - 49%     Transfers Chair/bed transfer  Transfers assist  Chair/bed transfer activity did not occur: Safety/medical concerns  Chair/bed transfer assist level: Moderate Assistance - Patient 50 - 74%     Locomotion Ambulation   Ambulation assist   Ambulation activity did not occur: Safety/medical concerns  Assist level: Contact Guard/Touching assist Assistive device: Walker-rolling Max distance: 50   Walk 10 feet activity   Assist  Walk 10 feet activity did not occur: Safety/medical concerns  Assist level: Supervision/Verbal cueing Assistive device: Walker-rolling   Walk 50 feet activity   Assist Walk 50 feet with 2 turns activity did not occur: Safety/medical concerns  Assist level: Contact Guard/Touching assist      Walk 150 feet  activity   Assist Walk 150 feet activity did not occur: Safety/medical concerns         Walk 10 feet on uneven surface  activity   Assist Walk 10 feet on uneven surfaces activity did not occur: Safety/medical concerns         Wheelchair     Assist   Type of Wheelchair:  Manual Wheelchair activity did not occur: Safety/medical concerns  Wheelchair assist level: Supervision/Verbal cueing Max wheelchair distance: 150    Wheelchair 50 feet with 2 turns activity    Assist    Wheelchair 50 feet with 2 turns activity did not occur: Safety/medical concerns   Assist Level: Supervision/Verbal cueing   Wheelchair 150 feet activity     Assist Wheelchair 150 feet activity did not occur: Safety/medical concerns   Assist Level: Supervision/Verbal cueing      Medical Problem List and Plan: 1.  Decreased functional mobility secondary to bilateral quadricep tendon rupture. Status post repair of bilateral tendon rupture repair 07/21/2018. Weightbearing as tolerated. Bilateral Bledsoe brace locked in extension             Continue CIR PT, OT 2.  Antithrombotics: DVT/anticoagulation:  Subcutaneous Lovenox 110 mg daily.   Lower extremity vascular ultrasound limited, but negative             -antiplatelet therapy: Not applicable 3. Pain Management:  Lidoderm patch  Wean oxycontin to qhs, DC'd on 3/5  Oxycodone immediate release for breakthrough pain  Lidoderm changed to as needed per patient/wife request  Controlled on 3/20 4. Mood:  Zoloft 25 mg daily. Provide emotional support  Appreciate Neuropsych consult- discussed with provider previously             -antipsychotic agents:  Not applicable 5. Neuropsych: This patient is capable of making decisions on his own behalf. 6. Skin/Wound Care: Stage I pressure ulcer. Follow-up wound care nurse Routine skin checks  Continue pressure relief 7. Fluids/Electrolytes/Nutrition:  Routine interval mouth with follow-up  BMP within acceptable range on 3/16  Labs ordered for Monday 8. Acute blood loss anemia. Transfused 1 unit packed red blood cells postoperatively.   Hemoglobin 10.4 on 3/17  Continue to monitor 9.  Super super obesity. Follow-up dietary services 10. New findingsType 2 diabetes mellitus.  Hemoglobin A1c 7.1.  Check blood sugars before meals and at bedtime. Diabetic teaching  Monitor with increased mobility CBG (last 3)  Recent Labs    08/24/18 1700 08/24/18 2121 08/25/18 0643  GLUCAP 122* 127* 128*   Glucophage 500 mg daily, increased to 850 on 3/10  Relatively controlled on 3/20 11. Acute otitis media. Augmentin 875-125 mg initiated 07/30/2018. 12. Constipation. Continue senokot-s bid  -had large BM today 13. Asthma/OSA/tobacco/ marijuana abuse. CPAP as directed.Continue Symbicort as well as nebulizer treatments. Provide counseling regards to tobacco abuse  Dulera DC'd per patient on 3/10 14.  Hypoalbuminemia  Supplement initiated on 2/26, DC'd on 3/4 per patient 15.  Labile blood pressure   Vitals:   08/24/18 1951 08/25/18 0416  BP: 136/63 121/68  Pulse: 70 68  Resp: 20 19  Temp: 98.1 F (36.7 C) 97.8 F (36.6 C)  SpO2: 95% 95%   Controlled on 3/20 16.  Sleep disturbance  Restoril started on 2/27  Trazodone decreased to 50 on 3/11  Improved 17. Sore throat  Chloraseptic spray ordered 18.  Pruritus  Benadryl cream ordered on 3/2, now patient states ineffective  Oral Benadryl PRN ordered, increased on 3/13 19.  Congestion in ears:  Debrox ordered  Added Claritin and flonase for nasal congestion  LOS: 24 days A FACE TO FACE EVALUATION WAS PERFORMED  Ankit Karis Juba 08/25/2018, 11:32 AM

## 2018-08-25 NOTE — Progress Notes (Signed)
Pt medicated for left shoulder pain. Pt's behavior is more appropriate this evening. Pt and significant other are calm, cooperative, and pleasant . Wife remains with pt call light in reach 4 SR up (low pressure air mattress in use).

## 2018-08-25 NOTE — Progress Notes (Signed)
Occupational Therapy Weekly Progress Note  Patient Details  Name: Larry Fowler MRN: [id number] Date of Birth: 11-Dec-1970  Beginning of progress report period: August 19, 2018 End of progress report period: August 25, 2018  Today's Date: 08/25/2018 OT Individual Time: 0355-9741 OT Individual Time Calculation (min): 85 min    Patient has met 2 of 3 short term goals.  Larry Fowler is making steady progress with OT at this time.  He has progressed to being able to complete supine to sit with supervision and for sit to supine with overall min assist for lifting the LLE into the bed.  He is able to complete UB selfcare at a supervision level with LB bathing at a max assist supine to sit and LB dressing with max assist sit to stand.  He is able to complete toilet transfers with min assist from the elevated bed to the bariatric toilet with use of the RW.  He needs max assist for completion of toilet hygiene and clothing management in standing.  AE has been used some with LB selfcare tasks however pt prefers to have assistance secondary to the equipment not working as well as he would like.  Will continue with integration over the next few days.  He continues to need total assist for donning bilateral hinged braces and they continue to not stay in place when he is moving or transferring.  New braces are scheduled to be delivered next week.  Sit to stand continues to be limited because of the braces with min guard to min assist from the toilet which is 23 inches or so.  He needs mod to max assist for sit to stand from the wheelchair which is still slightly lower.  Feel he is on target for current discharge expected 3/25.  Will downgrade some goals based on pt's preference of having assist for toileting and LB selfcare.  Will continue with current OT POC and CIR level therapies.    Patient continues to demonstrate the following deficits: muscle weakness and decreased standing balance and decreased balance  strategies and therefore will continue to benefit from skilled OT intervention to enhance overall performance with BADL and Reduce care partner burden.  Patient not progressing toward long term goals.  See goal revision..  Continue plan of care.  OT Short Term Goals Week 4:  OT Short Term Goal 1 (Week 4): Continue working toward established [place].  Skilled Therapeutic Interventions/Progress Updates:    Pt completed transfer from wheelchair to bariatric toilet with mod assist in order to transfer into the walk-in shower for bathing.  Therapist and tech removed hinged knee braces and propped LEs on objects for support in order to keep them straight and supported.  He then completed all bathing in sitting, with supervision for UB bathing and Mod assist for LB bathing to wash his legs.  After drying off therapist and tech donned braces again as well as crocs and had pt transfer out in the room for dressing.  He was able to donn his pullover shirt with supervision.  Sit to stand was completed with min guard assist from the bariatric toilet and therapist assisted with drying buttocks and applying powder.  He then transferred over to the bed and transitioned to supine with min assist.  In supine therapist assisted with drying front peri area and applying powder to areas of skin folds as well.  Total assist for donning shorts in supine rolling side to side. Pt left in bed at end of  session with completion of light BUE therapy band exercises using blue therapy band.  He completed 1 set of 10 repetitions for bilateral shoulder internal and 2 sets of 10 for external rotation (one set with arm by his side and the other with shoulder abducted to 90 degrees.  Min facilitation given for the LUE secondary to recent history of shoulder pain.  Pt left with call button and phone in reach.      Therapy Documentation Precautions:  Precautions Precautions: Fall Precaution Comments: NO knee flexion Required Braces or  Orthoses: Other Brace Knee Immobilizer - Right: On at all times Knee Immobilizer - Left: On at all times Other Brace: Bilateral Bledsoe braces locked in extension Restrictions Weight Bearing Restrictions: No RLE Weight Bearing: Weight bearing as tolerated LLE Weight Bearing: Weight bearing as tolerated Therapy Vitals Temp: 98.3 F (36.8 C) Temp Source: Oral Pulse Rate: 71 Resp: 20 BP: 136/76 Patient Position (if appropriate): Sitting Oxygen Therapy SpO2: 96 % O2 Device: Room Air Pain:   ADL: See Care Tool Section for some details of ADL  Therapy/Group: Individual Therapy  Larry Fowler OTR/L 08/25/2018, 3:35 PM

## 2018-08-25 NOTE — Progress Notes (Signed)
Physical Therapy Session Note  Patient Details  Name: Larry Fowler MRN: [id number] Date of Birth: 1971-02-28  Today's Date: 08/25/2018 PT Individual Time: 1515-1605 PT Individual Time Calculation (min): 50 min   Short Term Goals: Week 3:  PT Short Term Goal 1 (Week 3): Pt will perform SB to elevated surface with min assist  PT Short Term Goal 2 (Week 3): Pt will ambulate 12ft with RW and supervision assist  PT Short Term Goal 3 (Week 3): Pt will perform sit<>stand from 25inch mat table with supervision assist   Skilled Therapeutic Interventions/Progress Updates:    Patient seated in w/c and discussed current mobility issues of concern.  States needs to be able to get up from his wheelchair with less assistance.  Re-positioned and tightened knee braces.  Patient performed sit <>stand from w/c with +2 A (mod) successful 4 of 6 attempts.  Patient ambulated 24' then 68' with w/c follow and CGA to S.  Patient and wife report concern for d/c due to environmental factors with ramp not ready till next weekend and too much clutter in room where pt will need to enter.  SW spoke with wife during session and primary PT reports conversation not new.  Patient left in room in w/c per his request with wife present and needs in reach.   Therapy Documentation Precautions:  Precautions Precautions: Fall Precaution Comments: NO knee flexion Required Braces or Orthoses: Other Brace Knee Immobilizer - Right: On at all times Knee Immobilizer - Left: On at all times Other Brace: Bilateral Bledsoe braces locked in extension Restrictions Weight Bearing Restrictions: No RLE Weight Bearing: Weight bearing as tolerated LLE Weight Bearing: Weight bearing as tolerated Pain: Pain Assessment Faces Pain Scale: Hurts little more Pain Type: Chronic pain Pain Location: Knee Pain Orientation: Left Pain Descriptors / Indicators: Grimacing;Discomfort Pain Intervention(s): Repositioned;Rest;Elevated  extremity    Therapy/Group: Individual Therapy  Elray Mcgregor  Atkins, PT 08/25/2018, 5:18 PM

## 2018-08-26 ENCOUNTER — Inpatient Hospital Stay (HOSPITAL_COMMUNITY): Payer: Self-pay | Admitting: Physical Therapy

## 2018-08-26 ENCOUNTER — Inpatient Hospital Stay (HOSPITAL_COMMUNITY): Payer: Self-pay

## 2018-08-26 LAB — GLUCOSE, CAPILLARY
Glucose-Capillary: 139 mg/dL — ABNORMAL HIGH (ref 70–99)
Glucose-Capillary: 109 mg/dL — ABNORMAL HIGH (ref 70–99)
Glucose-Capillary: 128 mg/dL — ABNORMAL HIGH (ref 70–99)
Glucose-Capillary: 139 mg/dL — ABNORMAL HIGH (ref 70–99)

## 2018-08-26 MED ORDER — NAPROXEN 250 MG PO TABS
500.0000 mg | ORAL_TABLET | Freq: Two times a day (BID) | ORAL | Status: AC
  Administered 2018-08-26 – 2018-08-28 (×6): 500 mg via ORAL
  Filled 2018-08-26 (×8): qty 2

## 2018-08-26 NOTE — Progress Notes (Signed)
Larry Fowler is a 48 y.o. male who is admitted for CIR with deficits in mobility secondary to bilateral quadricep tendon rupture.  Status post repair July 21, 2018  Past Medical History:  Diagnosis Date  . ALLERGIC RHINITIS 09/12/2007   Qualifier: Diagnosis of  By: Jonny Ruiz MD, Len Blalock   . ANXIETY 02/16/2007   Qualifier: Diagnosis of  By: Tyrone Apple, Lucy    . ASTHMA 12/17/2009   Qualifier: Diagnosis of  By: Jonny Ruiz MD, Len Blalock   . Cannabis abuse    currently  . Chills with fever   . DEPRESSION 09/12/2007   Qualifier: Diagnosis of  By: Jonny Ruiz MD, Len Blalock   . History of cocaine use    in his 64's  . Hyperglycemia   . Rectal fistula 2009  . Rectal pain   . Renal stone    2008     Subjective: No new complaints.  His only new complaint is left shoulder pain.  This has been an intermittent problem chronically that has responded well to naproxen.  Objective: Vital signs in last 24 hours: Temp:  [98.3 F (36.8 C)-98.5 F (36.9 C)] 98.5 F (36.9 C) (03/21 0547) Pulse Rate:  [60-71] 60 (03/21 0547) Resp:  [16-20] 18 (03/21 0547) BP: (129-136)/(58-76) 129/58 (03/21 0547) SpO2:  [96 %-100 %] 97 % (03/21 0547) Weight change:  Last BM Date: 08/25/18  Intake/Output from previous day: 03/20 0701 - 03/21 0700 In: 680 [P.O.:680] Out: 850 [Urine:850] Last cbgs: CBG (last 3)  Recent Labs    08/25/18 1725 08/25/18 2141 08/26/18 0646  GLUCAP 109* 147* 139*     Physical Exam General: No apparent distress morbidly obese HEENT: not dry Lungs: Normal effort. Lungs clear to auscultation, no crackles or wheezes. Cardiovascular: Regular rate and rhythm, no edema Abdomen: S/NT/ND; BS(+) Musculoskeletal:  unchanged.  Mild lower extremity edema Neurological: No new neurological deficits Wounds: Surgical scars knees healing nicely Skin: clear no rash Mental state: Alert, oriented, cooperative    Lab Results: BMET    Component Value Date/Time   NA 139 08/21/2018 1521   K 4.3  08/21/2018 1521   CL 104 08/21/2018 1521   CO2 22 08/21/2018 1521   GLUCOSE 116 (H) 08/21/2018 1521   BUN 14 08/21/2018 1521   CREATININE 0.74 08/21/2018 1521   CALCIUM 9.4 08/21/2018 1521   GFRNONAA >60 08/21/2018 1521   GFRAA >60 08/21/2018 1521   CBC    Component Value Date/Time   WBC 7.9 08/22/2018 0535   RBC 3.52 (L) 08/22/2018 0535   HGB 10.4 (L) 08/22/2018 0535   HCT 34.2 (L) 08/22/2018 0535   PLT 170 08/22/2018 0535   MCV 97.2 08/22/2018 0535   MCH 29.5 08/22/2018 0535   MCHC 30.4 08/22/2018 0535   RDW 14.3 08/22/2018 0535   LYMPHSABS 2.6 08/21/2018 1521   MONOABS 0.6 08/21/2018 1521   EOSABS 0.2 08/21/2018 1521   BASOSABS 0.1 08/21/2018 1521    Medications: I have reviewed the patient's current medications.  Assessment/Plan:  Decreased functional mobility secondary to bilateral quadriceps tendon rupture.  Continue CIR Morbid obesity SVT prophylaxis.  Continue Lovenox Left shoulder pain.  Will treat with short-term naproxen which has been helpful in the past Diabetes mellitus.  Continue to monitor   Length of stay, days: 25  Gordy Savers , MD 08/26/2018, 10:09 AM

## 2018-08-26 NOTE — Plan of Care (Addendum)
  Problem: RH KNOWLEDGE DEFICIT GENERAL Goal: RH STG INCREASE KNOWLEDGE OF SELF CARE AFTER HOSPITALIZATION Description Min assist  Outcome: Not Progressing; c/o 10/10 left shoulder pain; MD notified new orders noted Problem: RH KNOWLEDGE DEFICIT GENERAL Goal: RH STG INCREASE KNOWLEDGE OF SELF CARE AFTER HOSPITALIZATION Description Min assist  Outcome: Not Progressing; patient still dependent on some task

## 2018-08-26 NOTE — Progress Notes (Signed)
Pt has home CPAP and states he does not need any help.

## 2018-08-26 NOTE — Progress Notes (Signed)
Physical Therapy Weekly Progress Note  Patient Details  Name: Larry Fowler MRN: [id number] Date of Birth: [birth year]  Beginning of progress report period: August 19, 2018 End of progress report period: August 26, 2018  Today's Date: 08/26/2018 PT Individual Time: 1005-1105 PT Individual Time Calculation (min): 60 min   Patient has met 3 of 3 short term goals.  Pt making steady progress towards LGT. Pt currently requires supervision assist for all bed mobility as well as sit<>stand and stand pivot transfers from elevated surfaces >25inches. Intermittent min-mod assist for sit<>stand from [facility] height depending on knee brace positioning and current pain level. Supervision assist to propel WC with rest breaks due to increased fatigue. Pt has also improved ability to WB through BLE and is able to ambulate up to 74f with CGA-supervision assist and bariatric RW.   Patient continues to demonstrate the following deficits muscle weakness and muscle joint tightness, decreased cardiorespiratoy endurance and decreased balance strategies and difficulty maintaining precautions and therefore will continue to benefit from skilled PT intervention to increase functional independence with mobility.  Patient progressing toward long term goals..  Continue plan of care.  PT Short Term Goals Week 1:  PT Short Term Goal 1 (Week 1): Pt will perform sit<>supine with mod assist  PT Short Term Goal 1 - Progress (Week 1): Met PT Short Term Goal 2 (Week 1): Pt will remain standing x 5 minutes on TIS bed.  PT Short Term Goal 2 - Progress (Week 1): Met PT Short Term Goal 3 (Week 1): Pt will perform sit<>stand from elevated surface with max + 2 assist   PT Short Term Goal 3 - Progress (Week 1): Met Week 2:  PT Short Term Goal 1 (Week 2): Pt will propell WC > 1525fwith superivsion assist through rehab unit  PT Short Term Goal 1 - Progress (Week 2): Met PT Short Term Goal 2 (Week 2): Pt Will perform SB [facility]  [facility] assist from mat table  PT Short Term Goal 2 - Progress (Week 2): Met PT Short Term Goal 3 (Week 2): Pt will perform sit<>stand form mat table with mod assist  PT Short Term Goal 3 - Progress (Week 2): Met Week 3:  PT Short Term Goal 1 (Week 3): Pt will perform SB to elevated surface with min assist  PT Short Term Goal 1 - Progress (Week 3): Met PT Short Term Goal 2 (Week 3): Pt will ambulate 2066fith RW and supervision assist  PT Short Term Goal 2 - Progress (Week 3): Met PT Short Term Goal 3 (Week 3): Pt will perform sit<>stand from 25inch mat table with supervision assist  PT Short Term Goal 3 - Progress (Week 3): Met Week 4:  PT Short Term Goal 1 (Week 4): STG=LTG due to ELOS  Skilled Therapeutic Interventions/Progress Updates:   Pt received supine in bed and agreeable to PT. PT applied Bil knee braces. Pt donned pants with supervision assist from PTonce up to knees. Supine>sit transfer with supervision assistand min cues for posture and safety. MD present to address L shoulder pain.   Sit<>stand from EOB with supervision assist on 2 attempts. Stand pivot transfer to [facility] and supervision assist and RW. Pt transported to rehab gym [facility] to allow WC to have fit through doorways in house upon d/c. Pt instructed in WC Verplanckbility without wheel rims 2 x 4f23fth supervision assist. Pt reports improved control of wheels without hand  rims present.   PT unlocked knee braces to go from 0-30. Sit<>stand from [facility] with mod assist after 2 attempts. Moderate cues for improved set up and LE placement to improve use of momentum. dycem added to RLE to prevent foot from sliding.   Gait training with RW x 60f with supevision assist from PT for safety and to tighten braces once in standing. [facility] returned to [facility] and [facility] and all needs met.                  Therapy Documentation Precautions:  Precautions Precautions:  Fall Precaution Comments: NO knee flexion Required Braces or Orthoses: Other Brace Knee Immobilizer - Right: On at all times Knee Immobilizer - Left: On at all times Other Brace: Bilateral Bledsoe braces locked in extension Restrictions Weight Bearing Restrictions: No RLE Weight Bearing: Weight bearing as tolerated LLE Weight Bearing: Weight bearing as tolerated   Pain:    7-10 L shoulder. MD aware. See MAR.   Therapy/Group: Individual Therapy  ALorie Phenix3/21/2020, 2:59 PM

## 2018-08-26 NOTE — Progress Notes (Addendum)
Physical Therapy Session Note  Patient Details  Name: Larry Fowler MRN: [id number] Date of Birth: 1970/08/06  Today's Date: 08/25/2018 PT Individual Time: 1400-1510   70 min   Short Term Goals: Week 3:  PT Short Term Goal 1 (Week 3): Pt will perform SB to elevated surface with min assist  PT Short Term Goal 2 (Week 3): Pt will ambulate 60f with RW and supervision assist  PT Short Term Goal 3 (Week 3): Pt will perform sit<>stand from 25inch mat table with supervision assist   Skilled Therapeutic Interventions/Progress Updates:   Pt received supine in bed and agreeable to PT. PT assists pt to don Bil braces. Supine>sit transfer with supervision assist  assist and min cues scooting to EOB cues. Sit>stand with supervision assist after 2 attempts with Bil braces locked into extension. [facility]. [facility]<>[facility] and max cues for improved use of momentum. WC mobility instructed by PT x 1521fwith supervision assist from PT in pts new WC. Cues for positioning in seat to improve friction on drive wheels. Sit<>[facility]. PT educated pt in challenges of ascending steps in bledsoe braces. PT strongly encouraged pt to have wife finish ramp as soon as possible and need for use of  Ambulance transport home to allow access to home regardless of completion of ramp. Patient returned to room and [facility] in reach and all needs met.         Therapy Documentation Precautions:  Precautions Precautions: Fall Precaution Comments: NO knee flexion Required Braces or Orthoses: Other Brace Knee Immobilizer - Right: On at all times Knee Immobilizer - Left: On at all times Other Brace: Bilateral Bledsoe braces locked in extension Restrictions Weight Bearing Restrictions: No RLE Weight Bearing: Weight bearing as tolerated LLE Weight Bearing: Weight bearing as tolerated Vital  Signs: Therapy Vitals Temp: 98.5 F (36.9 C) Temp Source: Oral Pulse Rate: 60 Resp: 18 BP: (!) 129/58 Patient Position (if appropriate): Lying Oxygen Therapy SpO2: 97 % O2 Device: Room Air Pain: Denies at rest    Therapy/Group: Individual Therapy  [name]/21/2020, 7:45 AM

## 2018-08-26 NOTE — Progress Notes (Signed)
Occupational Therapy Session Note  Patient Details  Name: Larry Fowler MRN: [id number] Date of Birth: 07-05-1970  Today's Date: 08/26/2018 OT Individual Time: 1445-1525 OT Individual Time Calculation (min): 40 min    Skilled Therapeutic Interventions/Progress Updates:    1;1. pain reported in shoulder but not rated. Total A for urine void in urinal, donning braces and shoes. Pt completes upine>sitting>w/c with S with bariatric RW and steadying of w/c. Fascial release massage to all aspects of deltoid, suprasinatus, and trapezius muscle for pain relief. K-tape applied to delt/suprispinatus to help with potential "sublux" patient reported being diagnosed with PTA. Exited session with pt seated in w/c,call lght in reach and all needs met  Therapy Documentation Precautions:  Precautions Precautions: Fall Precaution Comments: NO knee flexion Required Braces or Orthoses: Other Brace Knee Immobilizer - Right: On at all times Knee Immobilizer - Left: On at all times Other Brace: Bilateral Bledsoe braces locked in extension Restrictions Weight Bearing Restrictions: No RLE Weight Bearing: Weight bearing as tolerated LLE Weight Bearing: Weight bearing as tolerated General:    Therapy/Group: Individual Therapy  Tonny Branch 08/26/2018, 12:55 PM

## 2018-08-27 DIAGNOSIS — S76111D Strain of right quadriceps muscle, fascia and tendon, subsequent encounter: Secondary | ICD-10-CM

## 2018-08-27 LAB — GLUCOSE, CAPILLARY
Glucose-Capillary: 107 mg/dL — ABNORMAL HIGH (ref 70–99)
Glucose-Capillary: 113 mg/dL — ABNORMAL HIGH (ref 70–99)
Glucose-Capillary: 130 mg/dL — ABNORMAL HIGH (ref 70–99)
Glucose-Capillary: 147 mg/dL — ABNORMAL HIGH (ref 70–99)

## 2018-08-27 NOTE — Progress Notes (Signed)
Larry Fowler is a 48 y.o. male who is admitted for CIR with functional deficits in mobility following bilateral quadriceps tendon rupture.  He is status post repair in July 21, 2018  Past Medical History:  Diagnosis Date  . ALLERGIC RHINITIS 09/12/2007   Qualifier: Diagnosis of  By: Jonny Ruiz MD, Len Blalock   . ANXIETY 02/16/2007   Qualifier: Diagnosis of  By: Tyrone Apple, Lucy    . ASTHMA 12/17/2009   Qualifier: Diagnosis of  By: Jonny Ruiz MD, Len Blalock   . Cannabis abuse    currently  . Chills with fever   . DEPRESSION 09/12/2007   Qualifier: Diagnosis of  By: Jonny Ruiz MD, Len Blalock   . History of cocaine use    in his 49's  . Hyperglycemia   . Rectal fistula 2009  . Rectal pain   . Renal stone    2008     Subjective: No new complaints. No new problems.  Left shoulder discomfort much improved.  Objective: Vital signs in last 24 hours: Temp:  [97.7 F (36.5 C)-98.2 F (36.8 C)] 97.7 F (36.5 C) (03/22 0438) Pulse Rate:  [56-63] 56 (03/22 0438) Resp:  [16-20] 16 (03/22 0438) BP: (126-140)/(71-77) 126/71 (03/22 0438) SpO2:  [98 %-100 %] 98 % (03/22 0438) Weight change:  Last BM Date: 08/26/18  Intake/Output from previous day: 03/21 0701 - 03/22 0700 In: 700 [P.O.:700] Out: 450 [Urine:450] Last cbgs: CBG (last 3)  Recent Labs    08/26/18 1155 08/26/18 1633 08/26/18 2103  GLUCAP 109* 128* 139*   Patient Vitals for the past 24 hrs:  BP Temp Temp src Pulse Resp SpO2  08/27/18 0438 126/71 97.7 F (36.5 C) Oral (!) 56 16 98 %  08/26/18 1918 140/77 98.2 F (36.8 C) - 61 18 100 %  08/26/18 1507 126/71 97.9 F (36.6 C) - 63 20 98 %     Physical Exam General: No apparent distress   HEENT: not dry Lungs: Normal effort. Lungs clear to auscultation, no crackles or wheezes. Cardiovascular: Regular rate and rhythm, no edema Abdomen: S/NT/ND; BS(+) Musculoskeletal:  unchanged Neurological: No new neurological deficits Wounds: N/A    Skin: clear  Aging changes Mental state:  Alert, oriented, cooperative    Lab Results: BMET    Component Value Date/Time   NA 139 08/21/2018 1521   K 4.3 08/21/2018 1521   CL 104 08/21/2018 1521   CO2 22 08/21/2018 1521   GLUCOSE 116 (H) 08/21/2018 1521   BUN 14 08/21/2018 1521   CREATININE 0.74 08/21/2018 1521   CALCIUM 9.4 08/21/2018 1521   GFRNONAA >60 08/21/2018 1521   GFRAA >60 08/21/2018 1521   CBC    Component Value Date/Time   WBC 7.9 08/22/2018 0535   RBC 3.52 (L) 08/22/2018 0535   HGB 10.4 (L) 08/22/2018 0535   HCT 34.2 (L) 08/22/2018 0535   PLT 170 08/22/2018 0535   MCV 97.2 08/22/2018 0535   MCH 29.5 08/22/2018 0535   MCHC 30.4 08/22/2018 0535   RDW 14.3 08/22/2018 0535   LYMPHSABS 2.6 08/21/2018 1521   MONOABS 0.6 08/21/2018 1521   EOSABS 0.2 08/21/2018 1521   BASOSABS 0.1 08/21/2018 1521    Medications: I have reviewed the patient's current medications.  Assessment/Plan:  Decreased functional mobility secondary to bilateral quadriceps tendon rupture.  Status post repair.  Continue CIR Morbid obesity/OSA DVT prophylaxis.  Continue Lovenox Left shoulder pain improved Diabetes mellitus.  Stable    Length of stay, days: [address]  Lysle Dingwall , MD 08/27/2018, 9:55 AM

## 2018-08-28 ENCOUNTER — Inpatient Hospital Stay (HOSPITAL_COMMUNITY): Payer: Self-pay | Admitting: Occupational Therapy

## 2018-08-28 ENCOUNTER — Inpatient Hospital Stay (HOSPITAL_COMMUNITY): Payer: Self-pay

## 2018-08-28 LAB — GLUCOSE, CAPILLARY
Glucose-Capillary: 153 mg/dL — ABNORMAL HIGH (ref 70–99)
Glucose-Capillary: 114 mg/dL — ABNORMAL HIGH (ref 70–99)
Glucose-Capillary: 101 mg/dL — ABNORMAL HIGH (ref 70–99)
Glucose-Capillary: 146 mg/dL — ABNORMAL HIGH (ref 70–99)

## 2018-08-28 LAB — BASIC METABOLIC PANEL
Anion gap: 10 (ref 5–15)
BUN: 13 mg/dL (ref 6–20)
CO2: 27 mmol/L (ref 22–32)
Calcium: 9 mg/dL (ref 8.9–10.3)
Chloride: 101 mmol/L (ref 98–111)
Creatinine, Ser: 0.71 mg/dL (ref 0.61–1.24)
GFR calc Af Amer: >60 mL/min (ref >60)
GFR calc non Af Amer: >60 mL/min (ref >60)
Glucose, Bld: 140 mg/dL — ABNORMAL HIGH (ref 70–99)
Potassium: 4.4 mmol/L (ref 3.5–5.1)
Sodium: 138 mmol/L (ref 135–145)

## 2018-08-28 NOTE — Progress Notes (Signed)
Orthopedic Tech Progress Note Patient Details:  Larry Fowler 1970-09-11 [id number] Placed Order with [employer]         Nikita Surman Danella Penton 08/28/2018, 11:37 AM

## 2018-08-28 NOTE — Progress Notes (Signed)
Occupational Therapy Session Note  Patient Details  Name: Larry Fowler MRN: [id number] Date of Birth: Jun 19, 1970  Today's Date: 08/28/2018 OT Individual Time: 6546-5035 OT Individual Time Calculation (min): 72 min    Short Term Goals: Week 4:  OT Short Term Goal 1 (Week 4): Continue working toward established LTGs.  Skilled Therapeutic Interventions/Progress Updates:    Pt worked on bathing and dressing during session.  Prior to transfer to the EOB therapist assisted pt with washing front peri area and drying it.  He then transferred to the EOB with supervision and then to the bariatric wheelchair with min guard assist to work on bathing at the sink.  He was able to complete UB bathing with supervision as well as dressing.  Mod assist needed for sit to stand from the wheelchair in order for therapist to assist with washing buttocks with max assist.  He was able to donn his shorts over his LEs with use of the reacher as well as pulling them up over his hips with mod assist.  Max assist for washing lower legs and donning shoes.  Pt was left in the wheelchair with call button and phone in reach at end of session.   Therapy Documentation Precautions:  Precautions Precautions: Fall Precaution Comments: NO knee flexion Required Braces or Orthoses: Other Brace Knee Immobilizer - Right: On at all times Knee Immobilizer - Left: On at all times Other Brace: Bilateral Bledsoe braces locked in extension Restrictions Weight Bearing Restrictions: No RLE Weight Bearing: Weight bearing as tolerated LLE Weight Bearing: Weight bearing as tolerated  Pain: Pain Assessment Pain Scale: 0-10 Pain Score: 2  ADL: See Care Tool for some details of ADL  Therapy/Group: Individual Therapy  Mounir Skipper OTR/L 08/28/2018, 11:43 AM

## 2018-08-28 NOTE — Progress Notes (Addendum)
Nurses Angelique Blonder and Valentina Gu went into room to educate  patient and spouse on [facility] Policy Coronavirus Plan to "No visitor" the same rule applies, if the visitor is an employee.   The patient noted, "Administrator Kandis Mannan stated his wife could stay." Valentina Gu, RN apologize to them with understanding the rules applies to everyone with the implementation of safety. Patient called Kandis Mannan on his personal phone. Kandis Mannan instructed that was "okay that the patient's spouse stay until 9pm."   Angelique Blonder, RN informed him Kandis Mannan) "documentation with note you gave permission she could stay until 9pm."

## 2018-08-28 NOTE — Progress Notes (Signed)
Physical Therapy Session Note  Patient Details  Name: Larry Fowler MRN: [id number] Date of Birth: [birth year]  Today's Date: 08/28/2018 PT Individual Time: 1335-1430 PT Individual Time Calculation (min): 55 min   Short Term Goals: Week 4:  PT Short Term Goal 1 (Week 4): STG=LTG due to ELOS  Skilled Therapeutic Interventions/Progress Updates: Pt presented in w/c agreeable to therapy. [employer] arrived shortly after session started to fit new braces. Tammy Sours, rep fitted and educated on fit/locking/unlocking and mechanism of new brace (hinged brace with foot cap). Pt performed STS from w/c with modA x 2 and performed ambulatory transfer to bed. Pt noted to have significant increase in pain upon standing and requested assistance for BLE onto bed. While braces were fitted pt's pain subsided and pt was able to perform supine to sit with supervision and use of bed rail. Pt performed STS from elevated bed and ambulated approx 4ft to nsg station CGA with w/c follow. Pt transported back to room and attempted additional STS from w/c. Pt with x 2 attempts however unable to power up. Pt and PTA discussed options for pt to perform transfer once home. Pt indicated may only be able to pull up from built in sink. OT arrived during conservation and pt then handed off to OT for further problem solving.      Therapy Documentation Precautions:  Precautions Precautions: Fall Precaution Comments: NO knee flexion Required Braces or Orthoses: Other Brace Knee Immobilizer - Right: On at all times Knee Immobilizer - Left: On at all times Other Brace: Bilateral Bledsoe braces locked in extension Restrictions Weight Bearing Restrictions: No RLE Weight Bearing: Weight bearing as tolerated LLE Weight Bearing: Weight bearing as tolerated General:   Vital Signs: Therapy Vitals Temp: 98.4 F (36.9 C) Temp Source: Oral Pulse Rate: 70 Resp: 20 BP: 126/86 Oxygen Therapy SpO2: 97 % Pain: Pain Assessment Pain  Scale: Faces Faces Pain Scale: Hurts a little bit Pain Type: Acute pain Pain Location: Knee Pain Orientation: Left Pain Descriptors / Indicators: Discomfort Pain Onset: With Activity Pain Intervention(s): Repositioned;Emotional support Mobility:   Locomotion :    Trunk/Postural Assessment :    Balance:   Exercises:   Other Treatments:      Therapy/Group: Individual Therapy  Jonathan Kirkendoll 08/28/2018, 3:55 PM

## 2018-08-28 NOTE — Progress Notes (Signed)
[place] PHYSICAL MEDICINE & REHABILITATION PROGRESS NOTE  Subjective/Complaints: Patient seen laying in bed this AM.  He states he slept well overnight.  He states he had a good weekend. He has several questions regarding discharge.   ROS: Denies CP, SOB, N/V/D  Objective: Vital Signs: Blood pressure 119/71, pulse (!) 58, temperature 97.6 F (36.4 C), resp. rate 15, height 6\' 2"  (1.88 m), weight (!) 227.1 kg, SpO2 100 %. No results found. No results for input(s): WBC, HGB, HCT, PLT in the last 72 hours. No results for input(s): NA, K, CL, CO2, GLUCOSE, BUN, CREATININE, CALCIUM in the last 72 hours.  Physical Exam: BP 119/71 (BP Location: Right Wrist)   Pulse (!) 58   Temp 97.6 F (36.4 C)   Resp 15   Ht 6\' 2"  (1.88 m)   Wt (!) 227.1 kg   SpO2 100%   BMI 64.28 kg/m  Constitutional: No distress . Vital signs reviewed. HENT: Normocephalic.  Atraumatic. Eyes: EOMI. No discharge. Cardiovascular: RRR. No JVD. Respiratory: CTA bilaterally. Normal effort. GI: BS +. Non-distended. Musc: Bilateral edema and tenderness in knees, improving Neurological: He is alert and oriented.  Motor: Right lower extremity: Hip flexion 4/5, Knee extension, ankle dorsiflexion 5/5 Left lower extremity: Hip flexion 4/5, knee extension, ankle dorsiflexion 5/5 Skin: bilateral knee incisions CDI Psychiatric: pleasant  Assessment/Plan: 1. 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  Physiatrist is providing close team supervision and 24 hour management of active medical problems listed below.  Physiatrist and rehab team continue to assess barriers to discharge/monitor patient progress toward functional and medical goals  Care Tool:  Bathing    Body parts bathed by patient: Right arm, Left arm, Chest, Abdomen, Front perineal area, Face   Body parts bathed  by helper: Buttocks, Right upper leg, Left upper leg, Right lower leg, Left lower leg Body parts n/a: Left upper leg, Right lower leg, Right upper leg, Left lower leg(Did not attempt this session)   Bathing assist Assist Level: Maximal Assistance - Patient 24 - 49%     Upper Body Dressing/Undressing Upper body dressing   What is the patient wearing?: Pull over shirt    Upper body assist Assist Level: Supervision/Verbal cueing    Lower Body Dressing/Undressing Lower body dressing      What is the patient wearing?: Pants     Lower body assist Assist for lower body dressing: Total Assistance - Patient < 25%     Toileting Toileting    Toileting assist Assist for toileting: Maximal Assistance - Patient 25 - 49%     Transfers Chair/bed transfer  Transfers assist  Chair/bed transfer activity did not occur: Safety/medical concerns  Chair/bed transfer assist level: Supervision/Verbal cueing     Locomotion Ambulation   Ambulation assist   Ambulation activity did not occur: Safety/medical concerns  Assist level: Supervision/Verbal cueing Assistive device: Walker-rolling Max distance: 14ft   Walk 10 feet activity   Assist  Walk 10 feet activity did not occur: Safety/medical concerns  Assist level: Supervision/Verbal cueing Assistive device: Walker-rolling   Walk 50 feet activity   Assist Walk 50 feet with 2 turns activity did not occur: Safety/medical concerns  Assist level: Contact Guard/Touching assist      Walk 150 feet activity   Assist Walk 150 feet activity did not occur: Safety/medical concerns         Walk 10 feet  on uneven surface  activity   Assist Walk 10 feet on uneven surfaces activity did not occur: Safety/medical concerns         Wheelchair     Assist   Type of Wheelchair: Manual Wheelchair activity did not occur: Safety/medical concerns  Wheelchair assist level: Supervision/Verbal cueing Max wheelchair distance: 40     Wheelchair 50 feet with 2 turns activity    Assist    Wheelchair 50 feet with 2 turns activity did not occur: Safety/medical concerns   Assist Level: Supervision/Verbal cueing   Wheelchair 150 feet activity     Assist Wheelchair 150 feet activity did not occur: Safety/medical concerns   Assist Level: Supervision/Verbal cueing      Medical Problem List and Plan: 1.  Decreased functional mobility secondary to bilateral quadricep tendon rupture. Status post repair of bilateral tendon rupture repair 07/21/2018. Weightbearing as tolerated. Bilateral Bledsoe brace locked in extension             Continue CIR  Weekend notes reviewed 2.  Antithrombotics: DVT/anticoagulation:  Subcutaneous Lovenox 110 mg daily.   Lower extremity vascular ultrasound limited, but negative             -antiplatelet therapy: Not applicable 3. Pain Management:  Lidoderm patch  Wean oxycontin to qhs, DC'd on 3/5  Oxycodone immediate release for breakthrough pain  Lidoderm changed to as needed per patient/wife request  Controlled on 3/23 4. Mood:  Zoloft 25 mg daily. Provide emotional support  Appreciate Neuropsych consult- discussed with provider previously             -antipsychotic agents:  Not applicable 5. Neuropsych: This patient is capable of making decisions on his own behalf. 6. Skin/Wound Care: Stage I pressure ulcer. Follow-up wound care nurse Routine skin checks  Continue pressure relief 7. Fluids/Electrolytes/Nutrition:  Routine interval mouth with follow-up  BMP within acceptable range on 3/16  Labs pending 8. Acute blood loss anemia. Transfused 1 unit packed red blood cells postoperatively.   Hemoglobin 10.4 on 3/17  Continue to monitor 9.  Super super obesity. Follow-up dietary services  Encourage weight loss 10. New findingsType 2 diabetes mellitus. Hemoglobin A1c 7.1.  Check blood sugars before meals and at bedtime. Diabetic teaching  Monitor with increased mobility CBG  (last 3)  Recent Labs    08/27/18 1657 08/27/18 2053 08/28/18 0600  GLUCAP 130* 147* 153*   Glucophage 500 mg daily, increased to 850 on 3/10  Relatively controlled on 3/23 11. Acute otitis media. Augmentin 875-125 mg initiated 07/30/2018. 12. Constipation. Continue senokot-s bid 13. Asthma/OSA/tobacco/ marijuana abuse. CPAP as directed.Continue Symbicort as well as nebulizer treatments. Provide counseling regards to tobacco abuse  Dulera DC'd per patient on 3/10 14.  Hypoalbuminemia  Supplement initiated on 2/26, DC'd on 3/4 per patient 15.  Labile blood pressure   Vitals:   08/27/18 1915 08/28/18 0447  BP: 135/66 119/71  Pulse: 67 (!) 58  Resp: 16 15  Temp: 98.4 F (36.9 C) 97.6 F (36.4 C)  SpO2: 98% 100%   Controlled on 3/23 16.  Sleep disturbance  Restoril started on 2/27  Trazodone decreased to 50 on 3/11  Improved 17. Sore throat  Chloraseptic spray ordered 18.  Pruritus  Benadryl cream ordered on 3/2, now patient states ineffective  Oral Benadryl PRN ordered, increased on 3/13 19.  Congestion in ears:   Debrox ordered  Added Claritin and flonase for nasal congestion  LOS: 27 days A FACE TO FACE EVALUATION WAS PERFORMED  Roshawn Lacina Karis Juba 08/28/2018, 10:17 AM

## 2018-08-28 NOTE — Progress Notes (Signed)
Slept good. Refused scheduled Senna S, debrox ear gtts. and desyrel. Oxy IR 5mg 's given at 2132 for complaint of left shoulder pain. Needs assist with urinal. 2 BM's yesterday. Excited about upcoming discharge.Larry Fowler

## 2018-08-28 NOTE — Progress Notes (Signed)
Occupational Therapy Session Note  Patient Details  Name: Larry Fowler MRN: [id number] Date of Birth: 01/16/1971  Today's Date: 08/28/2018 OT Individual Time: 1430-1530 OT Individual Time Calculation (min): 60 min    Short Term Goals: Week 4:  OT Short Term Goal 1 (Week 4): Continue working toward established LTGs.  Skilled Therapeutic Interventions/Progress Updates:    Pt worked on sit to stand from the wheelchair with pillows underneath the wheelchair cushion.  He still needed min to mod assist to complete sit to stand.  He was able to perform sit to stand from the wide drop arm commode he is taking home with min guard assist.  He was most successful with sit to stand when he had a grab bar to pull up on the right side and use of the RW for supporting the LUE.  Finished session with discussion of home setup and how he will get up out of the current wheelchair he has.  Will continue to practice and problem solve in tomorrows session.  Pt left in the dayroom per his choice while his room was getting cleaned.    Therapy Documentation Precautions:  Precautions Precautions: Fall Precaution Comments: NO knee flexion Required Braces or Orthoses: Other Brace Knee Immobilizer - Right: On at all times Knee Immobilizer - Left: On at all times Other Brace: Bilateral Bledsoe braces locked in extension Restrictions Weight Bearing Restrictions: No RLE Weight Bearing: Weight bearing as tolerated LLE Weight Bearing: Weight bearing as tolerated  Pain: Pain Assessment Pain Scale: Faces Faces Pain Scale: Hurts a little bit Pain Type: Acute pain Pain Location: Knee Pain Orientation: Left Pain Descriptors / Indicators: Discomfort Pain Onset: With Activity Pain Intervention(s): Repositioned;Emotional support ADL: See Care Tool for some details of ADL  Therapy/Group: Individual Therapy  Zeph Riebel OTR/L 08/28/2018, 3:38 PM

## 2018-08-29 ENCOUNTER — Inpatient Hospital Stay (HOSPITAL_COMMUNITY): Payer: Self-pay | Admitting: Occupational Therapy

## 2018-08-29 ENCOUNTER — Inpatient Hospital Stay (HOSPITAL_COMMUNITY): Payer: Self-pay

## 2018-08-29 DIAGNOSIS — G8918 Other acute postprocedural pain: Secondary | ICD-10-CM

## 2018-08-29 LAB — CBC
HCT: 34.3 % — ABNORMAL LOW (ref 39.0–52.0)
HEMOGLOBIN: 10.5 g/dL — AB (ref 13.0–17.0)
MCH: 29.6 pg (ref 26.0–34.0)
MCHC: 30.6 g/dL (ref 30.0–36.0)
MCV: 96.6 fL (ref 80.0–100.0)
Platelets: 165 10*3/uL (ref 150–400)
RBC: 3.55 MIL/uL — ABNORMAL LOW (ref 4.22–5.81)
RDW: 14 % (ref 11.5–15.5)
WBC: 7.4 10*3/uL (ref 4.0–10.5)
nRBC: 0 % (ref 0.0–0.2)

## 2018-08-29 LAB — GLUCOSE, CAPILLARY
Glucose-Capillary: 134 mg/dL — ABNORMAL HIGH (ref 70–99)
Glucose-Capillary: 98 mg/dL (ref 70–99)
Glucose-Capillary: 107 mg/dL — ABNORMAL HIGH (ref 70–99)
Glucose-Capillary: 152 mg/dL — ABNORMAL HIGH (ref 70–99)

## 2018-08-29 NOTE — Discharge Summary (Addendum)
Physician Discharge Summary  Patient ID: Larry Fowler MRN: [id number] DOB/AGE: 47-19-1972 47 y.o.  Admit date: 08/01/2018 Discharge date: 08/30/2018  Discharge Diagnoses:  Active Problems:   Quadriceps tendon rupture   Hypoalbuminemia due to protein-calorie malnutrition (HCC)   New onset type 2 diabetes mellitus (HCC)   Sleep disturbance   Labile blood pressure   Labile blood glucose   Sore throat   Pruritus   Episode of recurrent major depressive disorder (HCC)   Congestion of both ears DVT prophylaxis Pain management Acute blood loss anemia Morbid obesity Asthma/OSA/tobacco/marijuana use  Discharged Condition: stable  Significant Diagnostic Studies: Vas [place] Lower Extremity Venous (dvt)  Result Date: 08/02/2018  Lower Venous Study Indications: Swelling.  Performing Technologist: Levada Schilling RDMS, RVT  Examination Guidelines: A complete evaluation includes B-mode imaging, spectral Doppler, color Doppler, and power Doppler as needed of all accessible portions of each vessel. Bilateral testing is considered an integral part of a complete examination. Limited examinations for reoccurring indications may be performed as noted.  Right Venous Findings: +---------+---------------+---------+-----------+----------+-------+          CompressibilityPhasicitySpontaneityPropertiesSummary +---------+---------------+---------+-----------+----------+-------+ CFV      Full                                                 +---------+---------------+---------+-----------+----------+-------+ SFJ      Full                                                 +---------+---------------+---------+-----------+----------+-------+ FV Prox  Full                                                 +---------+---------------+---------+-----------+----------+-------+ FV Mid   Full                                                  +---------+---------------+---------+-----------+----------+-------+ FV DistalFull                                                 +---------+---------------+---------+-----------+----------+-------+ PFV      Full                                                 +---------+---------------+---------+-----------+----------+-------+ POP      Full                                                 +---------+---------------+---------+-----------+----------+-------+ PTV                     Yes  Yes                          +---------+---------------+---------+-----------+----------+-------+ PERO                    Yes      Yes                          +---------+---------------+---------+-----------+----------+-------+ GSV      Full                                                 +---------+---------------+---------+-----------+----------+-------+  Left Venous Findings: +---------+---------------+---------+-----------+----------+------------------+          CompressibilityPhasicitySpontaneityPropertiesSummary            +---------+---------------+---------+-----------+----------+------------------+ CFV      Full                                                            +---------+---------------+---------+-----------+----------+------------------+ SFJ      Full                                                            +---------+---------------+---------+-----------+----------+------------------+ FV Prox  Full                                                            +---------+---------------+---------+-----------+----------+------------------+ FV Mid   Full                                                            +---------+---------------+---------+-----------+----------+------------------+ FV DistalFull                                         poor visualization  +---------+---------------+---------+-----------+----------+------------------+ PFV      Full                                                            +---------+---------------+---------+-----------+----------+------------------+ POP      Full                                                            +---------+---------------+---------+-----------+----------+------------------+  PTV                                                   Not visualized     +---------+---------------+---------+-----------+----------+------------------+ PERO                                                  Not visualized     +---------+---------------+---------+-----------+----------+------------------+ GSV      Full                                                            +---------+---------------+---------+-----------+----------+------------------+    Summary: Right: There is no evidence of deep vein thrombosis in the lower extremity. However, portions of this examination were limited- see technologist comments above. No cystic structure found in the popliteal fossa. Left: There is no evidence of deep vein thrombosis in the lower extremity. However, portions of this examination were limited- see technologist comments above. No cystic structure found in the popliteal fossa.  *See table(s) above for measurements and observations. Electronically signed by Gretta Beganodd Early MD on 08/02/2018 at 6:25:28 PM.    Final     Labs:  Basic Metabolic Panel: Recent Labs  Lab 08/28/18 1140  NA 138  K 4.4  CL 101  CO2 27  GLUCOSE 140*  BUN 13  CREATININE 0.71  CALCIUM 9.0    CBC: No results for input(s): WBC, NEUTROABS, HGB, HCT, MCV, PLT in the last 168 hours.  CBG: Recent Labs  Lab 08/27/18 2053 08/28/18 0600 08/28/18 1151 08/28/18 1652 08/28/18 2132  GLUCAP 147* 153* 114* 101* 146*   Family history. Mother with type 2 diabetes mellitus followed with history of pneumonia. Denies any  cancer  Brief HPI:    Larry Fowler is a 48 year old right-handed male with history of OSA, tobacco use, anxiety/depression, asthma, morbid obesity with BMI of 64. Lives with spouse independent prior to admission working as a Research scientist (medical)dog groomer. One level home to steps to entry. Presented 07/20/2018 after slipping and falling backwards with acute onset of bilateral knee pain. CT of the knees revealed bilateral quad tendon rupture and underwent repair on 07/21/2018 by Dr.Xu. Postoperative weightbearing as tolerated with bilateral knee braces. Ongoing issues of pain management and initially OxyContin was scheduled. Acute blood loss anemia 7.8 transfuse 1 unit packed red blood cells. Subcutaneous Lovenox for DVT prophylaxis. Findings of elevated hemoglobin A1c of 7.1 blood sugars monitored. Placed on Augmentin after spiked low-grade fevers findings of acute otitis media. Therapy evaluations completed and patient was admitted for a comprehensive rehabilitation program  Hospital Course: Larry Fowler was admitted to rehab 08/01/2018 for inpatient therapies to consist of PT, ST and OT at least three hours five days a week. Past admission physiatrist, therapy team and rehab RN have worked together to provide customized collaborative inpatient rehab. Pertaining to Mr. Jessica PriestBenston bilateral quadricep tendon rupture. He underwent repair 07/21/2018 and would follow up with orthopedic services Dr.Xu. Weightbearing as tolerated bilateral Bledsoe knee brace is in place 0-30. He continued on subcutaneous  Lovenox for DVT prophylaxis throughout his hospital course vascular studies negative. Pain management with the use of a Lidoderm patch, Robaxin as needed as well as oxycodone for breakthrough pain. Mood stabilization with Zoloft as well as Desyrel at nighttime with Restoril. Emotional support provided during long hospital course as well as follow-up per neuropsychology. Acute blood loss anemia latus hemoglobin 10.4. noted morbid  obesity BMI 64.28 dietary follow-up. Findings type 2 diabetes mellitus hemoglobin A1c 7.1. Patient on Glucophage 850 mg daily with full diabetic teaching. Bouts of constipation resolved with laxative assistance. Patient did have a history of tobacco as well as marijuana use receiving full counsel regard to cessation of nicotine products as well as any illicit drug use. Labile blood pressure overall managed no orthostasis and continued on Cozaar 25 mg daily. He would follow up with his primary M.D.  Physical exam. Blood pressure 117/56 pulse 64 temperature 90.8 respirations 16 oxygen saturation 100% Constitutional. Oriented to person place and time. HEENT Head. Normocephalic atraumatic Eyes. EOMs are normal right eye exhibits no discharge left eye no discharge no nystagmus pupils reactive to light Neck. Normal range of motion neck supple no thyromegaly without bruit Cardiac. Normal rate and rhythm without murmur or rub GI soft obese positive bowel sounds no distention nontender Musculoskeletal. Lower extremity edema and tenderness Neurological. Motor. Bilateral upper extremities 5 out of 5 proximal to distal bilateral lower extremities flexion 2 out of 5, knee extension locked in brace ankle dorsiflexion 5 out of 5. Skin. Bilateral lower extremity incision sites clean and dry  Rehab course: During patient's stay in rehab weekly team conferences were held to monitor patient's progress, set goals and discuss barriers to discharge. At admission, patient required +2 physical assist anterior posterior transfers. +2 physical assist supine to sit, moderate assist sit to supine. General transfer employed the use of vital go bed until to standing. Moderate assist upper body bathing, max assist lower body bathing, minimal assist upper body dressing, total assist lower body dressing.  He/She  has had improvement in activity tolerance, balance, postural control as well as ability to compensate for deficits.  He/She has had improvement in functional use RUE/LUE  and RLE/LLE as well as improvement in awareness.patient performs sit to stand from wheelchair with moderate assist 2 ambulatory transfers to the bed. Patient able to perform supine to sit with supervision and use of bed rail. Ambulate 30 feet contact guard assist. He was able to perform sit to stand from the wide drop arm commode minimal guard assist. He was most successful with sit to stand when he had a grab bar pull up from. Full family teaching was completed and plan discharge to home       Disposition:  Discharge to home   Diet: 1800-calorie-2200-calorie ADA diet  Special Instructions: Weightbearing as tolerated Bledsoe knee braces  Medications at discharge. 1. Tylenol as needed 2. Flonase 2 sprays each nostril daily 3. Gerhardt but [place] twice daily 4. Mucinex 1200 mg twice daily 5. Lidoderm patch administer over 12 hours daily as needed 6. Claritin 10 mg by mouth daily 7. Cozaar 25 mg by mouth daily 8. Glucophage 850 mg by mouth daily 9. Robaxin 750 mg every 8 hours as needed spasms 10. Multivitamin daily 11. Oxycodone 10 mg every 4 hours as needed pain 12. Protonix 40 mg by mouth daily 13. MiraLAX daily as needed 14. Senokot 3 tablets by mouth twice a day hold for loose stools 15. Zoloft 25 mg by mouth daily 16. Restoril  50 mg by mouth daily at bedtime 17. Desyrel 50 mg by mouth daily at bedtime  Discharge Instructions    Ambulatory referral to Physical Medicine Rehab   Complete by:  As directed    Moderate complexity follow-up 1-2 weeks bilateral quadricep tendon rupture      Follow-up Information    Marcello Fennel, MD Follow up.   Specialty:  Physical Medicine and Rehabilitation Why:  office to call for appointment Contact information: [address] [place] [place] [address] [phone]        Tarry Kos, MD Follow up.   Specialty:  Orthopedic Surgery Why:  call for appointment 2  weeks Contact information: [address] [place] [address] [phone]           Signed: Charlton Amor 08/29/2018, 5:39 AM Patient seen and examined by me on day of discharge. Maryla Morrow, MD, ABPMR

## 2018-08-29 NOTE — Progress Notes (Signed)
Occupational Therapy Session Note  Patient Details  Name: Larry Fowler MRN: [id number] Date of Birth: 1971-05-31  Today's Date: 08/29/2018 OT Individual Time: 0902-1002 OT Individual Time Calculation (min): 60 min    Short Term Goals: Week 4:  OT Short Term Goal 1 (Week 4): Continue working toward established LTGs.  Skilled Therapeutic Interventions/Progress Updates:    Pt completed toilet transfer and toileting to start the session.  Mod assist for sit to stand from the wheelchair with min guard assist to transfer around to the bariatric toilet.  He needed mod assist for clothing management as well as total assist for toilet hygiene once he was finished.  He then transferred back to the wheelchair and then over to the sink for bathing tasks.  Supervision for UB bathing with overall mod assist for LB bathing sit to stand.  He needed max assist for cleaning his front peri area with mod assist for pulling up shorts over his hips once washing and drying was completed.  He did not attempt washing his lower legs or feet as he was wearing his braces with shoe insert and did not wish to remove them this session.  Finished with pt transferring to the bed to rest.  Mod assist for sit to stand from the wheelchair with min assist for transfer to the EOB.  Mod facilitation needed for bringing LEs into the bed.  Pt left with call button and phone in reach.    Therapy Documentation Precautions:  Precautions Precautions: Fall Precaution Comments: NO knee flexion Required Braces or Orthoses: Other Brace Knee Immobilizer - Right: On at all times Knee Immobilizer - Left: On at all times Other Brace: Bilateral Bledsoe braces locked in extension Restrictions Weight Bearing Restrictions: No RLE Weight Bearing: Weight bearing as tolerated LLE Weight Bearing: Weight bearing as tolerated  Pain: Pain Assessment Pain Scale: Faces Faces Pain Scale: Hurts a little bit Pain Type: Surgical pain Pain  Location: Knee Pain Orientation: Left Pain Descriptors / Indicators: Discomfort Pain Onset: With Activity Pain Intervention(s): Repositioned ADL: See Care Tool for some details of ADL  Therapy/Group: Individual Therapy  Maxine Fredman OTR/L 08/29/2018, 12:01 PM

## 2018-08-29 NOTE — Progress Notes (Signed)
Inpatient Diabetes Program Recommendations  AACE/ADA: New Consensus Statement on Inpatient Glycemic Control (2015)  Target Ranges:  Prepandial:   less than 140 mg/dL      Peak postprandial:   less than 180 mg/dL (1-2 hours)      Critically ill patients:  140 - 180 mg/dL   Lab Results  Component Value Date   GLUCAP 98 08/29/2018   HGBA1C 7.1 (H) 07/24/2018    Review of Glycemic Control Results for DAG, ROHLOFF" (MRN [id number]) as of 08/29/2018 16:38  Ref. Range 08/28/2018 21:32 08/29/2018 06:30 08/29/2018 11:52  Glucose-Capillary Latest Ref Range: 70 - 99 mg/dL 528 (H) 413 (H) 98   Diabetes history: New onset Outpatient Diabetes medications: none  Current orders for Inpatient glycemic control: Novolog 0-9 units TID, Metformin 850 mg QAM  Inpatient Diabetes Program Recommendations:    Noted consult for new onset DM. Agree with current orders. Patient was seen by DM coordinator on 07/25/2018. Dietitian has already seen patient. Will place order for outpatient education. Patient should have LWWDM booklet. Will reach out to patient.  Please ensure patient has blood glucose meter ([id number]) at discharge. Will plan to reach out to patient prior to discharge to answer further questions.   Thanks, Lujean Rave, MSN, RNC-OB Diabetes Coordinator [phone] (8a-5p)

## 2018-08-29 NOTE — Progress Notes (Signed)
Physical Therapy Session Note  Patient Details  Name: Larry Fowler MRN: [id number] Date of Birth: 19-Dec-1970  Today's Date: 08/29/2018 PT Individual Time: 1115-1215 PT Individual Time Calculation (min): 60 min   Short Term Goals: Week 4:  PT Short Term Goal 1 (Week 4): STG=LTG due to ELOS  Skilled Therapeutic Interventions/Progress Updates: Pt presented in bed sleeping but easily aroused. Pt stating some pain in L shoulder and elbow but expressing no intervention. Pt performed bed mobility with HOB elevated to approx 15 degrees with supervision and increased time. Pt performed STS with HOB elevated and minA. Ambulated with close S x 68f with RW and w/c follow. Pt indicating discomfort in groin area where top of brace is. PTA placed washcloth to provided padding where plastic touching skin with some relief. Jen OT suggested and placed foam tape on bracket of brace to minimize rubbing of brace. Pt ambulated additional 35fwith pt stating minimal change in feel of brace. Pt required modA x 1 with +1 for safety for STS from w/c. With support provided from posterior of pt to assist with momentum with forward thrust. Pt propelled w/c remaining distance back to room and left with lunch tray set up, call bell within reach and needs met.      Therapy Documentation Precautions:  Precautions Precautions: Fall Precaution Comments: NO knee flexion Required Braces or Orthoses: Other Brace Knee Immobilizer - Right: On at all times Knee Immobilizer - Left: On at all times Other Brace: Bilateral Bledsoe braces locked in extension Restrictions Weight Bearing Restrictions: Yes RLE Weight Bearing: Weight bearing as tolerated LLE Weight Bearing: Weight bearing as tolerated General: PT Amount of Missed Time (min): 15 Minutes Vital Signs: Therapy Vitals Temp: 98 F (36.7 C) Pulse Rate: 70 Resp: 20 BP: (!) 113/55 Patient Position (if appropriate): Lying Oxygen Therapy SpO2: 99 % O2 Device:  Room Air  Therapy/Group: Individual Therapy  Larry Fowler  Larry Fowler, PTA  08/29/2018, 4:17 PM

## 2018-08-29 NOTE — Progress Notes (Signed)
Administered barrier topical on barrier on bottom

## 2018-08-29 NOTE — Progress Notes (Signed)
Physical Therapy Discharge Summary  Patient Details  Name: Larry Fowler MRN: [id number] Date of Birth: [birth year]  Today's Date: 08/29/2018    Patient has met 8 of 8 long term goals due to improved activity tolerance, improved balance, increased strength, decreased pain and improved awareness.  Patient to discharge at an ambulatory level [place].   Patient's care partner is independent to provide the necessary physical assistance at discharge.  Reasons goals not met: All goals met  Recommendation:  Patient will benefit from ongoing skilled PT services in home health setting to continue to advance safe functional mobility, address ongoing impairments in balance, gait, endurance, strength, and minimize fall risk.  Equipment: 24x18 w/c and bariatric RW  Reasons for discharge: treatment goals met  Patient/family agrees with progress made and goals achieved: Yes  PT Discharge Precautions/Restrictions Precautions Required Braces or Orthoses: Other Brace Knee Immobilizer - Right: On at all times Knee Immobilizer - Left: On at all times Other Brace: Bilateral Bledsoe braces 0-45 degrees Restrictions Weight Bearing Restrictions: Yes RLE Weight Bearing: Weight bearing as tolerated LLE Weight Bearing: Weight bearing as tolerated Vital Signs Therapy Vitals Temp: 98 F (36.7 C) Pulse Rate: 70 Resp: 20 BP: (!) 113/55 Patient Position (if appropriate): Lying Oxygen Therapy SpO2: 99 % O2 Device: Room Air Pain Pain Assessment Faces Pain Scale: Hurts a little bit Pain Type: Surgical pain Pain Location: Knee Pain Orientation: Left Pain Intervention(s): Repositioned;Emotional support Multiple Pain Sites: No Vision/Perception    WFL Cognition Overall Cognitive Status: Within Functional Limits for tasks assessed Arousal/Alertness: Awake/alert Orientation Level: Oriented X4 Attention: Sustained Memory: Appears intact Awareness: Appears intact Problem Solving: Appears  intact Safety/Judgment: Appears intact Sensation Sensation Light Touch: Appears Intact Hot/Cold: Appears Intact Proprioception: Appears Intact Stereognosis: Appears Intact Coordination Gross Motor Movements are Fluid and Coordinated: Yes Fine Motor Movements are Fluid and Coordinated: Yes Motor  Motor Motor: Within Functional Limits Motor - Discharge Observations: BLE movement limited by Beldsoe braces  Mobility Bed Mobility Bed Mobility: Rolling Right;Rolling Left Rolling Right: Supervision/verbal cueing Rolling Left: Supervision/Verbal cueing Transfers Transfers: Sit to Stand Sit to Stand: Supervision/Verbal cueing Transfer (Assistive device): Rolling walker Locomotion  Gait Ambulation: Yes Gait Distance (Feet): 40 Feet Assistive device: Rolling walker Gait Gait: Yes Gait Pattern: Wide base of support;Personnel officer: Yes Wheelchair Assistance: Set up Lexicographer: Both upper extremities Wheelchair Parts Management: Needs assistance Distance: 31f  Trunk/Postural Assessment  Cervical Assessment Cervical Assessment: Within Functional Limits Thoracic Assessment Thoracic Assessment: Within Functional Limits Lumbar Assessment Lumbar Assessment: Within Functional Limits Postural Control Postural Control: Within Functional Limits  Balance Balance Balance Assessed: Yes Dynamic Sitting Balance Sitting balance - Comments: able to perform functional activity (UB clothing management) without LOB  Static Standing Balance Static Standing - Level of Assistance: 6: Modified independent (Device/Increase time) Extremity Assessment      RLE Assessment RLE Assessment: Exceptions to WK[facility]heimGeneral Strength Comments: grossly 4+/5 at hip, knee ROM limited by restrictions  LLE Assessment LLE Assessment: Exceptions to WBrighton Surgery Center LLCGeneral Strength Comments: grossly 4+/5 at hip, knee ROM limited by restrictions     Larry  Fowler 08/29/2018, 4:34 PM

## 2018-08-29 NOTE — Plan of Care (Signed)
  Problem: RH BOWEL ELIMINATION Goal: RH STG MANAGE BOWEL WITH ASSISTANCE Description STG Manage Bowel with Min Assistance.  Outcome: Progressing   Problem: RH PAIN MANAGEMENT Goal: RH STG PAIN MANAGED AT OR BELOW PT'S PAIN GOAL Description Patient will be pain free or pain less than 3 during admission  Outcome: Progressing

## 2018-08-29 NOTE — Progress Notes (Addendum)
Nutrition Follow-up   Addendum: RD working remotely.  DOCUMENTATION CODES:   Morbid obesity  INTERVENTION:  - Continue snacks daily  - Continue double protein portions with all meals  - Continue MVI with minerals daily  NUTRITION DIAGNOSIS:   Increased nutrient needs related to wound healing as evidenced by estimated needs.  Ongoing, being addressed via snacks and double portions  GOAL:   Patient will meet greater than or equal to 90% of their needs  Progressing  MONITOR:   PO intake, Supplement acceptance, Skin, Labs  REASON FOR ASSESSMENT:   Consult Assessment of nutrition requirement/status, Wound healing  ASSESSMENT:   48 year old male with PMH of OSA-CPAP, tobacco abuse, anxiety/depression, asthma, and morbid obesity. Pt resented 07/20/18 after slipping and falling backwards with acute onset of bilateral knee pain. CT of the knees done revealing bilateral quad tendon rupture. Pt underwent repair on 07/21/18. Findings of elevated hemoglobin A1C of 7.1. Pt admitted to CIR on 2/25.  Noted plan for pt to d/c home tomorrow.  No new weights since 08/23/18.  Meal Completion: 50-100% x 8 meals (average 91%)  Medications reviewed and include: SSI, Metformin, MVI with minerals, Protonix, Senna  Labs reviewed. CBG's: 134, 146, 101 x 24 hours  Diet Order:   Diet Order            Diet Carb Modified Fluid consistency: Thin; Room service appropriate? Yes  Diet effective now              EDUCATION NEEDS:   Education needs have been addressed  Skin:  Skin Assessment: Skin Integrity Issues: Stage II: Right & left buttocks, Left thigh Incisions: L knee, R knee  Last BM:  08/28/18 medium type 6  Height:   Ht Readings from Last 1 Encounters:  08/01/18 6\' 2"  (1.88 m)    Weight:   Wt Readings from Last 1 Encounters:  08/23/18 (!) 227.1 kg    Ideal Body Weight:  86.4 kg  BMI:  Body mass index is 64.28 kg/m.  Estimated Nutritional Needs:   Kcal:   2600-2800  Protein:  120-135 grams  Fluid:  >/= 2.4 L    Earma Reading, MS, RD, LDN Inpatient Clinical Dietitian Pager: [phone] Weekend/After Hours: [phone]

## 2018-08-29 NOTE — Progress Notes (Signed)
Educated patient and spouse (via phone), on diabetes medication. Patient had numerous questions concerning insulin and blood sugars. Explained why they are checked more frequently in the hospital setting versus at home and that most likely he would be discharged home with his oral diabetic medication of 850 mg Metformin. Explained that his A1C was 6.7 in 03/2017 and 7.1 on 07/24/2018. Patient had already received "Living with Diabetes" booklet from pharmacy, and an order for diabetes consult was put it in for the patient. Diabetes education will continue until discharge.

## 2018-08-29 NOTE — Progress Notes (Signed)
Occupational Therapy Discharge Summary  Patient Details  Name: Larry Fowler MRN: [id number] Date of Birth: 27-Apr-1971  Today's Date: 08/29/2018 OT Individual Time: 2376-2831 OT Individual Time Calculation (min): 61 min   Session Note:  Pt worked on sit to stand transitions from the wheelchair with different techniques.  Pt feels most comfortable with therapist in front of him when in the wheelchair and helping to pull up on the LUE while he pushes up from the wheelchair with the RUE.  He is able to complete sit to stand from the elevated toilet with min assist however.  During session pt also ambulated approximately 15 ft with close supervision once standing.  Therapist also constructed a handle for his urinal so he could be more self sufficient.  Finished session with call button and phone in reach and pt transferring to the bariatric toilet with mod assist.  He was able to manage his clothing prior to sitting down with mod assist.    Patient has met 9 of 11 long term goals due to improved activity tolerance, improved balance and ability to compensate for deficits.  Patient to discharge at overall Mod Assist level.  Patient's care partner is independent to provide the necessary physical assistance at discharge.    Reasons goals not met: Pt needs mod assist for sit to stand from the wheelchair with min assist or better from higher surfaces.  Recommendation:  Patient will benefit from ongoing skilled OT services in home health setting to continue to advance functional skills in the area of BADL and Reduce care partner burden.  Pt is still limited secondary to his size, the BLE hinged knee braces, and his overall strength and ability to complete sit to stand transitions for selfcare tasks.  Feel he will continue to benefit from [facility] at discharge to increase independence back to a modified independent level.   Equipment: tub bench, 3:1  Reasons for discharge: treatment goals met and discharge  from hospital  Patient/family agrees with progress made and goals achieved: Yes  OT Discharge Precautions/Restrictions  Precautions Precautions: Fall Required Braces or Orthoses: Other Brace Knee Immobilizer - Right: On at all times Knee Immobilizer - Left: On at all times Other Brace: Bilateral Bledsoe braces 0-45 degrees Restrictions Weight Bearing Restrictions: Yes RLE Weight Bearing: Weight bearing as tolerated LLE Weight Bearing: Weight bearing as tolerated   Pain Pain Assessment Pain Scale: Faces Faces Pain Scale: Hurts a little bit Pain Type: Surgical pain Pain Location: Knee Pain Orientation: Left Pain Descriptors / Indicators: Discomfort Pain Onset: With Activity Pain Intervention(s): Repositioned;Emotional support Multiple Pain Sites: No ADL ADL Eating: Independent Where Assessed-Eating: Wheelchair Grooming: Modified independent Where Assessed-Grooming: Wheelchair Upper Body Bathing: Setup Where Assessed-Upper Body Bathing: Wheelchair Lower Body Bathing: Moderate assistance Where Assessed-Lower Body Bathing: Wheelchair, Sitting at sink, Standing at sink Upper Body Dressing: Supervision/safety Where Assessed-Upper Body Dressing: Sitting at sink Lower Body Dressing: Maximal assistance Where Assessed-Lower Body Dressing: Edge of bed Toileting: Maximal assistance Where Assessed-Toileting: Bedside Commode Toilet Transfer: Minimal assistance Toilet Transfer Method: Stand pivot Toilet Transfer Equipment: Extra wide bedside commode Tub/Shower Transfer: Not assessed Social research officer, government: Not assessed Vision Baseline Vision/History: Wears glasses Wears Glasses: At all times Patient Visual Report: No change from baseline Vision Assessment?: No apparent visual deficits Perception  Perception: Within Functional Limits Praxis Praxis: Intact Cognition Overall Cognitive Status: Within Functional Limits for tasks assessed Arousal/Alertness:  Awake/alert Orientation Level: Oriented X4 Attention: Sustained Memory: Appears intact Awareness: Appears intact Problem Solving: Appears intact  Safety/Judgment: Appears intact Sensation Sensation Light Touch: Appears Intact Hot/Cold: Appears Intact Proprioception: Appears Intact Stereognosis: Appears Intact Additional Comments: sensation intact in BUEs Coordination Gross Motor Movements are Fluid and Coordinated: Yes Fine Motor Movements are Fluid and Coordinated: Yes Motor  Motor Motor: Within Functional Limits Motor - Discharge Observations: BLE movement limited by Beldsoe braces and pain Mobility  Bed Mobility Bed Mobility: Rolling Right;Rolling Left Rolling Right: Supervision/verbal cueing Rolling Left: Supervision/Verbal cueing Transfers Sit to Stand: Minimal Assistance - Patient > 75%  Trunk/Postural Assessment  Cervical Assessment Cervical Assessment: Within Functional Limits Thoracic Assessment Thoracic Assessment: Within Functional Limits Lumbar Assessment Lumbar Assessment: Within Functional Limits Postural Control Postural Control: Within Functional Limits  Balance Balance Balance Assessed: Yes Static Sitting Balance Static Sitting - Level of Assistance: 5: Stand by assistance Dynamic Sitting Balance Dynamic Sitting - Level of Assistance: 5: Stand by assistance Sitting balance - Comments: able to perform functional activity (UB clothing management) without LOB  Static Standing Balance Static Standing - Level of Assistance: 5: Stand by assistance Extremity/Trunk Assessment RUE Assessment RUE Assessment: Within Functional Limits LUE Assessment LUE Assessment: Exceptions to [facility] Active Range of Motion (AROM) Comments: WFLs for all joints General Strength Comments: pt with shoulder strength for flexion at 4/5, internal rotation 4/5, external rotation 3+/5,  Elbow flexion and extension 4/5.  Pt with recent history of right anterior shoulder pain which is  present with palpation around the biceps tendon.  He reports having a sleeve to wear over the shoulder but it does not feel comfortable so he hasn't been using it consistently.     Brant Peets OTR/L 08/29/2018, 4:44 PM

## 2018-08-29 NOTE — Progress Notes (Signed)
Physical Therapy Session Note  Patient Details  Name: Larry Fowler MRN: [id number] Date of Birth: April 21, 1971  Today's Date: 08/29/2018 PT Individual Time: 0800-0845 PT Individual Time Calculation (min): 45 min   Short Term Goals: Week 4:  PT Short Term Goal 1 (Week 4): STG=LTG due to ELOS  Skilled Therapeutic Interventions/Progress Updates:    Pt received supine in bed, agreeable to PT session. Pt reports pain but does not indicate location nor rate, declines any intervention. Pt is dependent to don BLE immobilizers while in supine. Pt is max A to don shorts with rolling L/R with Supervision. Supine to sit Supervision. Pt is setup A to don shirt while seated EOB. Pt is CGA to stand to RW from elevated bed height. Ambulation x 20 ft with RW and Supervision before onset of fatigue. Pt left seated in w/c in room with needs in reach at end of session.  Therapy Documentation Precautions:  Precautions Precautions: Fall Precaution Comments: NO knee flexion Required Braces or Orthoses: Other Brace Knee Immobilizer - Right: On at all times Knee Immobilizer - Left: On at all times Other Brace: Bilateral Bledsoe braces locked in extension Restrictions Weight Bearing Restrictions: No RLE Weight Bearing: Weight bearing as tolerated LLE Weight Bearing: Weight bearing as tolerated    Therapy/Group: Individual Therapy   Peter Congo, PT, DPT  08/29/2018, 12:22 PM

## 2018-08-29 NOTE — Progress Notes (Signed)
Phlebotomy came to draw labs, pt refused and gave young lady his middle finger. She informed Clinical research associate she will ask another colleague to come draw his blood.

## 2018-08-29 NOTE — Progress Notes (Signed)
[name] PHYSICAL MEDICINE & REHABILITATION PROGRESS NOTE  Subjective/Complaints: Patient seen laying in bed this morning.  He states he slept well overnight.  He states he is looking forward to discharge tomorrow.  He is question regarding his wife visiting him to take his belongings.  ROS: Denies CP, SOB, N/V/D  Objective: Vital Signs: Blood pressure 126/79, pulse 60, temperature 98.3 F (36.8 C), temperature source Oral, resp. rate 18, height  (1.88 m), weight (!) 227.1 kg, SpO2 100 %. No results found. Recent Labs    08/29/18 0549  WBC 7.4  HGB 10.5*  HCT 34.3*  PLT 165   Recent Labs    08/28/18 1140  NA 138  K 4.4  CL 101  CO2 27  GLUCOSE 140*  BUN 13  CREATININE 0.71  CALCIUM 9.0    Physical Exam: BP 126/79 (BP Location: Right Arm)   Pulse 60   Temp 98.3 F (36.8 C) (Oral)   Resp 18   Ht  (1.88 m)   Wt (!) 227.1 kg   SpO2 100%   BMI 64.28 kg/m  Constitutional: No distress . Vital signs reviewed. HENT: Normocephalic.  Atraumatic. Eyes: EOMI. No discharge. Cardiovascular: RRR.  No JVD. Respiratory: CTA bilaterally.  Normal effort. GI: BS +. Non-distended. Musc: Bilateral edema and tenderness in knees, improving Neurological: He is alert and oriented.  Motor: Right lower extremity: Hip flexion 4/5, Knee extension, ankle dorsiflexion 5/5. improving Left lower extremity: Hip flexion 4/5, knee extension, ankle dorsiflexion 5/5, improving Skin: bilateral knee incisions CDI Psychiatric: pleasant  Assessment/Plan: 1. 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  Physiatrist is providing close team supervision and 24 hour management of active medical problems listed below.  Physiatrist and rehab team continue to assess barriers to discharge/monitor patient progress toward functional and medical  goals  Care Tool:  Bathing    Body parts bathed by patient: Right arm, Left arm, Chest, Abdomen, Face, Left upper leg, Right upper leg   Body parts bathed by helper: Front perineal area, Buttocks, Right lower leg, Left lower leg Body parts n/a: Left upper leg, Right lower leg, Right upper leg, Left lower leg(Did not attempt this session)   Bathing assist Assist Level: Moderate Assistance - Patient 50 - 74%     Upper Body Dressing/Undressing Upper body dressing   What is the patient wearing?: Pull over shirt    Upper body assist Assist Level: Supervision/Verbal cueing    Lower Body Dressing/Undressing Lower body dressing      What is the patient wearing?: Pants     Lower body assist Assist for lower body dressing: Moderate Assistance - Patient 50 - 74%     Toileting Toileting    Toileting assist Assist for toileting: Maximal Assistance - Patient 25 - 49%     Transfers Chair/bed transfer  Transfers assist  Chair/bed transfer activity did not occur: Safety/medical concerns  Chair/bed transfer assist level: Contact Guard/Touching assist     Locomotion Ambulation   Ambulation assist   Ambulation activity did not occur: Safety/medical concerns  Assist level: Supervision/Verbal cueing Assistive device: Walker-rolling Max distance: 58ft   Walk 10 feet activity   Assist  Walk 10 feet activity did not occur: Safety/medical concerns  Assist level: Supervision/Verbal cueing Assistive device: Walker-rolling   Walk 50 feet activity   Assist Walk 50 feet with 2 turns activity did not occur:  Safety/medical concerns  Assist level: Contact Guard/Touching assist      Walk 150 feet activity   Assist Walk 150 feet activity did not occur: Safety/medical concerns         Walk 10 feet on uneven surface  activity   Assist Walk 10 feet on uneven surfaces activity did not occur: Safety/medical concerns         Wheelchair     Assist   Type of  Wheelchair: Manual Wheelchair activity did not occur: Safety/medical concerns  Wheelchair assist level: Supervision/Verbal cueing Max wheelchair distance: 40    Wheelchair 50 feet with 2 turns activity    Assist    Wheelchair 50 feet with 2 turns activity did not occur: Safety/medical concerns   Assist Level: Supervision/Verbal cueing   Wheelchair 150 feet activity     Assist Wheelchair 150 feet activity did not occur: Safety/medical concerns   Assist Level: Supervision/Verbal cueing      Medical Problem List and Plan: 1.  Decreased functional mobility secondary to bilateral quadricep tendon rupture. Status post repair of bilateral tendon rupture repair 07/21/2018. Weightbearing as tolerated. Bilateral Bledsoe brace locked in extension             Continue CIR  Plan for d/c tomorrow  Will see patient for hospital follow up in 1 month post-discharge 2.  Antithrombotics: DVT/anticoagulation:  Subcutaneous Lovenox 110 mg daily.   Lower extremity vascular ultrasound limited, but negative             -antiplatelet therapy: Not applicable 3. Pain Management:  Lidoderm patch  Wean oxycontin to qhs, DC'd on 3/5  Oxycodone immediate release for breakthrough pain  Lidoderm changed to as needed per patient/wife request  Controlled on 3/24 4. Mood:  Zoloft 25 mg daily. Provide emotional support  Appreciate Neuropsych consult- discussed with provider previously             -antipsychotic agents:  Not applicable 5. Neuropsych: This patient is capable of making decisions on his own behalf. 6. Skin/Wound Care: Stage I pressure ulcer. Follow-up wound care nurse Routine skin checks  Continue pressure relief 7. Fluids/Electrolytes/Nutrition:  Routine interval mouth with follow-up  BMP within acceptable range on 3/23, except for glucose 8. Acute blood loss anemia. Transfused 1 unit packed red blood cells postoperatively.   Hemoglobin 10.5 on 3/24  Continue to monitor 9.  Super  super obesity. Follow-up dietary services  Encourage weight loss 10. New findingsType 2 diabetes mellitus. Hemoglobin A1c 7.1.  Check blood sugars before meals and at bedtime. Diabetic teaching  Monitor with increased mobility CBG (last 3)  Recent Labs    08/28/18 1652 08/28/18 2132 08/29/18 0630  GLUCAP 101* 146* 134*   Glucophage 500 mg daily, increased to 850 on 3/10  Relatively controlled on 3/24 11. Acute otitis media. Augmentin 875-125 mg initiated 07/30/2018. 12. Constipation. Continue senokot-s bid 13. Asthma/OSA/tobacco/ marijuana abuse. CPAP as directed.Continue Symbicort as well as nebulizer treatments. Provide counseling regards to tobacco abuse  Dulera DC'd per patient on 3/10 14.  Hypoalbuminemia  Supplement initiated on 2/26, DC'd on 3/4 per patient 15.  Labile blood pressure   Vitals:   08/28/18 1947 08/29/18 0516  BP: 123/80 126/79  Pulse: 69 60  Resp: 16 18  Temp: 98.3 F (36.8 C) 98.3 F (36.8 C)  SpO2: 98% 100%   Controlled on 3/24 16.  Sleep disturbance  Restoril started on 2/27  Trazodone decreased to 50 on 3/11  Improved 17. Sore throat  Chloraseptic  spray ordered 18.  Pruritus  Benadryl cream ordered on 3/2, now patient states ineffective  Oral Benadryl PRN ordered, increased on 3/13 19.  Congestion in ears:   Debrox ordered  Added Claritin and flonase for nasal congestion  LOS: 28 days A FACE TO FACE EVALUATION WAS PERFORMED  Larry Fowler 08/29/2018, 9:53 AM

## 2018-08-30 LAB — GLUCOSE, CAPILLARY
GLUCOSE-CAPILLARY: 125 mg/dL — AB (ref 70–99)
Glucose-Capillary: 97 mg/dL (ref 70–99)

## 2018-08-30 MED ORDER — OXYCODONE HCL 10 MG PO TABS: 10.0000 mg | ORAL_TABLET | ORAL | 0 refills | Status: AC | PRN

## 2018-08-30 MED ORDER — TRAZODONE HCL 50 MG PO TABS: 50.0000 mg | ORAL_TABLET | Freq: Every day | ORAL | 0 refills | Status: DC

## 2018-08-30 MED ORDER — LIDOCAINE 5 % EX PTCH: 1.0000 | MEDICATED_PATCH | Freq: Every day | CUTANEOUS | 0 refills | Status: DC | PRN

## 2018-08-30 MED ORDER — ACETAMINOPHEN 325 MG PO TABS: 325.0000 mg | ORAL_TABLET | Freq: Four times a day (QID) | ORAL | Status: DC | PRN

## 2018-08-30 MED ORDER — LOSARTAN POTASSIUM 25 MG PO TABS: 25.0000 mg | ORAL_TABLET | Freq: Every day | ORAL | 0 refills | Status: DC

## 2018-08-30 MED ORDER — GUAIFENESIN ER 600 MG PO TB12: 1200.0000 mg | ORAL_TABLET | Freq: Two times a day (BID) | ORAL | 0 refills | Status: DC

## 2018-08-30 MED ORDER — METHOCARBAMOL 750 MG PO TABS: 750.0000 mg | ORAL_TABLET | Freq: Three times a day (TID) | ORAL | 0 refills | Status: DC | PRN

## 2018-08-30 MED ORDER — METFORMIN HCL 850 MG PO TABS: 850.0000 mg | ORAL_TABLET | Freq: Every day | ORAL | 1 refills | Status: DC

## 2018-08-30 MED ORDER — PANTOPRAZOLE SODIUM 40 MG PO TBEC: 40.0000 mg | DELAYED_RELEASE_TABLET | Freq: Every day | ORAL | 0 refills | Status: DC

## 2018-08-30 MED ORDER — LORATADINE 10 MG PO TABS: 10.0000 mg | ORAL_TABLET | Freq: Every day | ORAL | 0 refills | Status: DC

## 2018-08-30 MED ORDER — SERTRALINE HCL 25 MG PO TABS: 25.0000 mg | ORAL_TABLET | Freq: Every day | ORAL | 0 refills | Status: DC

## 2018-08-30 MED ORDER — ADULT MULTIVITAMIN W/MINERALS CH: 1.0000 | ORAL_TABLET | Freq: Every day | ORAL | Status: DC

## 2018-08-30 MED ORDER — TEMAZEPAM 15 MG PO CAPS: 15.0000 mg | ORAL_CAPSULE | Freq: Every day | ORAL | 0 refills | Status: DC

## 2018-08-30 MED ORDER — SENNOSIDES-DOCUSATE SODIUM 8.6-50 MG PO TABS: 3.0000 | ORAL_TABLET | Freq: Two times a day (BID) | ORAL | Status: DC

## 2018-08-30 MED FILL — LIDOCAINE PATCH 5%: 5 | 30 days supply | Qty: 30 | Fill #0

## 2018-08-30 MED FILL — METHOCARBAMOL 750 MG TABS: 750 | 20 days supply | Qty: 60 | Fill #0

## 2018-08-30 MED FILL — SERTRALINE HCL 25 MG TABLET: 25 | 30 days supply | Qty: 30 | Fill #0

## 2018-08-30 MED FILL — traZODone HCL 50 MG TABS: 50 | 10 days supply | Qty: 10 | Fill #0

## 2018-08-30 MED FILL — metFORMIN HCL 850 MG TABS: 850 | 30 days supply | Qty: 30 | Fill #0

## 2018-08-30 MED FILL — oxyCODONE HCL 10 MG TABS: 10 | 5 days supply | Qty: 30 | Fill #0

## 2018-08-30 MED FILL — TEMAZEPAM 15 MG CAPSULE: 15 | 10 days supply | Qty: 10 | Fill #0

## 2018-08-30 MED FILL — PANTOPRAZOLE SOD DR 40 MG T: 40 | 30 days supply | Qty: 30 | Fill #0

## 2018-08-30 MED FILL — LOSARTAN POTASSIUM 25 MG TA: 25 | 30 days supply | Qty: 30 | Fill #0

## 2018-08-30 NOTE — Progress Notes (Signed)
Patient has left the unit via ambulance and been discharged to his home. Patients personal belongings were sent home with his wife and questions were answered prior to leaving the unit. Reuben Likes, LPN

## 2018-08-30 NOTE — Progress Notes (Signed)
Social Work  Discharge Note  The overall goal for the admission was met for:   Discharge location: Yes - home with wife who can provide intermittent assistance  Length of Stay: Yes - 29 days  Discharge activity level: Yes - min/ mod assist overall  Home/community participation: Yes  Services provided included: MD, RD, PT, OT, RN, TR, Pharmacy, Neuropsych and SW  Financial Services: Private Insurance: [employer]  Follow-up services arranged: Home Health: RN, PT, OT via [place], DME: 24x18 wheelchair, Ulice Dash 2 cushion, bariatric rolling walker, bariatric bedside commode and bariatric tub transfer bench via [place] and Patient/Family has no preference for HH/DME agencies  Pt transported home via [facility]  Comments (or additional information):  Contact info:  Pt at [phone]  Patient/Family verbalized understanding of follow-up arrangements: Yes  Individual responsible for coordination of the follow-up plan: pt  Confirmed correct DME delivered: Liticia Gasior 08/30/2018    Delsa Walder

## 2018-08-30 NOTE — Discharge Instructions (Signed)
Inpatient Rehab Discharge Instructions  Larry Fowler Discharge date and time: No discharge date for patient encounter.   Activities/Precautions/ Functional Status: Activity: weightbearing as tolerated with Bledsoe brace is locked in extension Diet: diabetic diet Wound Care: keep wound clean and dry Functional status:  ___ No restrictions     ___ Walk up steps independently ___ 24/7 supervision/assistance   ___ Walk up steps with assistance ___ Intermittent supervision/assistance  ___ Bathe/dress independently ___ Walk with walker     _x__ Bathe/dress with assistance ___ Walk Independently    ___ Shower independently ___ Walk with assistance    ___ Shower with assistance ___ No alcohol     ___ Return to work/school ________    COMMUNITY REFERRALS UPON DISCHARGE:    Home Health:   PT     OT       RN                      Agency:  [facility] Phone: [phone]   Medical Equipment/Items Ordered: wheelchair, cushion, walker, commode and tub bench                                                     Agency/Supplier:  [facility] @ [phone]      Special Instructions: No driving, smoking or alcohol   My questions have been answered and I understand these instructions. I will adhere to these goals and the provided educational materials after my discharge from the hospital.  Patient/Caregiver Signature _______________________________ Date __________  Clinician Signature _______________________________________ Date __________  Please bring this form and your medication list with you to all your follow-up doctor's appointments.

## 2018-08-30 NOTE — Progress Notes (Signed)
Inpatient Diabetes Program Recommendations  AACE/ADA: New Consensus Statement on Inpatient Glycemic Control (2015)  Target Ranges:  Prepandial:   less than 140 mg/dL      Peak postprandial:   less than 180 mg/dL (1-2 hours)      Critically ill patients:  140 - 180 mg/dL   Lab Results  Component Value Date   GLUCAP 125 (H) 08/30/2018   HGBA1C 7.1 (H) 07/24/2018    Review of Glycemic Control Results for Larry Fowler, Larry Fowler" (MRN [id number]) as of 08/30/2018 09:37  Ref. Range 08/29/2018 17:03 08/29/2018 21:55 08/30/2018 06:14  Glucose-Capillary Latest Ref Range: 70 - 99 mg/dL 709 (H) 628 (H) 366 (H)   Diabetes history: New onset Outpatient Diabetes medications: none  Current orders for Inpatient glycemic control: Novolog 0-9 units TID, Metformin 850 mg QAM  Inpatient Diabetes Program Recommendations:    Noted consult for new onset DM. Agree with current orders. Patient was seen by DM coordinator on 07/25/2018. Dietitian has already seen patient. Will place order for outpatient education. Please ensure patient has blood glucose meter ([id number]) at discharge.   Spoke with patient remotely (by phone) to follow up on patient's questions regarding new onset DM. Previous A1C result was 6.7% back in 2018.  Reviewed patient's current A1c of 7.1%. Explained what a A1c is and what it measures. Also reviewed goal A1c with patient, importance of good glucose control @ home, and blood sugar goals. Reviewed role of pancreas, A1C in depth, ADA guidelines for diagnosis and recommendations for increasing activity, DM type 2, inpatient goals, role of insulin, survival skills, Metformin benefits/side effects/action, vascular changes, impact of weight, and other comorbidites.  Patient will need a meter at discharge. Information given on [employer] for UMR. Reviewed when to check CBGs, target goals, and encouraged to take log with him to next PCP appointment. Patient plans to make outpatient appointment  today. Discussed the importance of continuation of carb modified diet, reviewed carb counting and encouraged to go to outpatient education. Patient has no further questions at this time.   Thanks, Lujean Rave, MSN, RNC-OB Diabetes Coordinator [phone] (8a-5p)

## 2018-08-31 ENCOUNTER — Encounter: Payer: Self-pay | Admitting: Internal Medicine

## 2018-08-31 ENCOUNTER — Other Ambulatory Visit: Payer: Self-pay

## 2018-08-31 ENCOUNTER — Other Ambulatory Visit: Payer: Self-pay | Admitting: *Deleted

## 2018-08-31 NOTE — Patient Outreach (Signed)
[facility]) Care Management  08/31/2018  DOYT HETZEL 07-31-1970 [id number]    Transition of care call   Referral received: 08/30/2018 Initial outreach:  08/31/2018 Surgery/Procedure: 07/21/2018 Insurance: [employer]    Subjective: Initial successful telephone call to patient's preferred number in order to complete transition of care assessment; 2 HIPAA identifiers verified. Explained purpose of call and completed transition of care assessment.  States he is doing well, denies post op problems, states surgical pain well managed with prescribed medications (some medication pt continue to awaiting mail order delivery), tolerating  Diet (carb modified) , denies bowel or bladder problems.  Spouse is assisting  with his recovery.      Objective:  Mr. Zalar was [facility] from 07/20/2018-08/01/2018 for Ruptured right quadriceps tendon. Discharged to inpt rehab 08/01/2018-08/30/2018. Comorbidities include: Obese, Diabetes, HTN and OSA.  He was discharged to home on 08/30/2018 with the need for home health services PT/RN.   Assessment:  Patient voices good understanding of all discharge instructions.  See transition of care flowsheet for assessment details.   Plan:  Reviewed [place] [employer] Management 2020 Wellness Requirements of: Completing the computerized Health Assessment and the Health Action Step with Active Health Management [facility]) by February 06 2019 AND have an annual physical between June 07, 2017 and December 06, 2018.  No ongoing care management needs identified so will close case to [facility] Care Management care management services and route successful outreach letter with [facility] Care Management pamphlet and 24 Hour Nurse Line Magnet to [employer] Care Management clinical pool to be mailed to patient's home address.  Elliot Cousin, RN Care Management Coordinator [facility] Main Office [phone]

## 2018-09-01 ENCOUNTER — Other Ambulatory Visit: Payer: Self-pay

## 2018-09-01 ENCOUNTER — Encounter: Payer: Self-pay | Admitting: Registered Nurse

## 2018-09-01 ENCOUNTER — Encounter: Payer: No Typology Code available for payment source | Attending: Registered Nurse | Admitting: Registered Nurse

## 2018-09-01 ENCOUNTER — Other Ambulatory Visit: Payer: Self-pay | Admitting: Physical Medicine & Rehabilitation

## 2018-09-01 DIAGNOSIS — G479 Sleep disorder, unspecified: Secondary | ICD-10-CM

## 2018-09-01 DIAGNOSIS — E119 Type 2 diabetes mellitus without complications: Secondary | ICD-10-CM | POA: Diagnosis not present

## 2018-09-01 DIAGNOSIS — S76112A Strain of left quadriceps muscle, fascia and tendon, initial encounter: Secondary | ICD-10-CM

## 2018-09-01 DIAGNOSIS — S76111A Strain of right quadriceps muscle, fascia and tendon, initial encounter: Secondary | ICD-10-CM

## 2018-09-01 NOTE — Progress Notes (Signed)
Virtual Transitional Care call  Patient name: Larry Fowler  DOB: April 06, 1971 1. Are you/is patient experiencing any problems since coming home? No a. Are there any questions regarding any aspect of care? No 2. Are there any questions regarding medications administration/dosing? Mr. Barona reports he's waiting on his  Medication to be delivered from [facility], also states his medication should be there today. He didn't want this provider to send his medication to a different pharmacy, he was instructed to call office on Monday, he verbalizes understanding.  a. Are meds being taken as prescribed? See above. b. "Patient should review meds with caller to confirm"  3. Have there been any falls? No 4. Has Home Health been to the house and/or have they contacted you? Yes, [facility]. a. If not, have you tried to contact them? NA b. Can we help you contact them? NA 5. Are bowels and bladder emptying properly?Yes a. Are there any unexpected incontinence issues? No b. If applicable, is patient following bowel/bladder programs? No 6. Any fevers, problems with breathing, unexpected pain? No 7. Are there any skin problems or new areas of breakdown? No 8. Has the patient/family member arranged specialty MD follow up (ie cardiology/neurology/renal/surgical/etc.)?  Mr. Montour was instructed to call Dr. Roda Shutters to schedule HFU appointment, he verbalizes understanding.  a. Can we help arrange? NA 9. Does the patient need any other services or support that we can help arrange? No 10. Are caregivers following through as expected in assisting the patient? Yes 11. Has the patient quit smoking, drinking alcohol, or using drugs as recommended? Mr. Oke reports since his discharge he hasn't smoke, drank alcohol or use illicit drugs.  Appointment date/time 10/02/2018  arrival time 12:45, scheduled appointment at 1:00. With Dr. Allena Katz at [phone]

## 2018-09-01 NOTE — Telephone Encounter (Signed)
Larry Fowler or Larry Fowler to counsel patient on whether a referral is required for surgical f/u as above, thanks

## 2018-09-04 ENCOUNTER — Encounter: Payer: No Typology Code available for payment source | Admitting: Registered Nurse

## 2018-09-04 ENCOUNTER — Telehealth: Payer: Self-pay

## 2018-09-04 NOTE — Telephone Encounter (Signed)
Yehuda Mao, PT/[facility] called requesting HHPT 1wk1, 2wk2 and also HHRN 1wk4, 2 prn for [name], RN/[facility] Orders approved and given per discharge summary.

## 2018-09-05 ENCOUNTER — Inpatient Hospital Stay (INDEPENDENT_AMBULATORY_CARE_PROVIDER_SITE_OTHER): Payer: No Typology Code available for payment source | Admitting: Orthopaedic Surgery

## 2018-09-05 ENCOUNTER — Telehealth: Payer: Self-pay | Admitting: *Deleted

## 2018-09-05 NOTE — Telephone Encounter (Signed)
[facility] called to get VO for SN 1wk4 and 2 prn r/t stg 2 wound on buttocks.  Approval given.

## 2018-09-07 ENCOUNTER — Telehealth: Payer: Self-pay | Admitting: *Deleted

## 2018-09-07 NOTE — Telephone Encounter (Signed)
Beaulah Corin, OT, [facility] left a message asking for verbal orders for home health occupational therapy 1week1 followed by 2week2.  Medical record reviewed. Social work note reviewed.  Verbal orders given per office protocol.

## 2018-09-13 ENCOUNTER — Other Ambulatory Visit: Payer: Self-pay | Admitting: Internal Medicine

## 2018-09-13 ENCOUNTER — Encounter: Payer: Self-pay | Admitting: Internal Medicine

## 2018-09-13 MED ORDER — ONETOUCH ULTRA 2 W/DEVICE KIT: PACK | 0 refills | Status: DC

## 2018-09-13 MED ORDER — GLUCOSE BLOOD VI STRP: ORAL_STRIP | 12 refills | Status: DC

## 2018-09-13 MED ORDER — LANCETS MISC: 12 refills | Status: DC

## 2018-09-13 MED FILL — FREESTYLE LITE METER: 30 days supply | Qty: 1 | Fill #0

## 2018-09-13 MED FILL — FREESTYLE LITE TEST STRIP: 90 days supply | Qty: 100 | Fill #0

## 2018-09-13 MED FILL — FREESTYLE LANCETS: 90 days supply | Qty: 100 | Fill #0

## 2018-09-20 ENCOUNTER — Telehealth (INDEPENDENT_AMBULATORY_CARE_PROVIDER_SITE_OTHER): Payer: Self-pay

## 2018-09-20 NOTE — Telephone Encounter (Signed)
Following up on request for orders for PT for pt. Said she called about this on Monday and faxed request as well. Needing clarification on increasing pts knee flexion.

## 2018-09-20 NOTE — Telephone Encounter (Signed)
SEE MESSAGE

## 2018-09-20 NOTE — Telephone Encounter (Signed)
May increase to ROM as tolerated and may wean braces as indicated by PT

## 2018-09-21 ENCOUNTER — Telehealth (INDEPENDENT_AMBULATORY_CARE_PROVIDER_SITE_OTHER): Payer: Self-pay | Admitting: Orthopaedic Surgery

## 2018-09-21 NOTE — Telephone Encounter (Signed)
Lowell Guitar, [facility], called yesterday to request VO for [facility] PT for 2x a week for 2 weeks.  CB#[phone]

## 2018-09-21 NOTE — Telephone Encounter (Signed)
Called jeannine back to advise on message below

## 2018-09-21 NOTE — Telephone Encounter (Signed)
Called Kelly back no answer LMOM with details below.

## 2018-09-22 ENCOUNTER — Telehealth: Payer: Self-pay | Admitting: *Deleted

## 2018-09-22 MED FILL — ALBUTEROL SULFATE HFA 108 (: 108 (90 BAS | 25 days supply | Qty: 9 | Fill #0

## 2018-09-22 NOTE — Telephone Encounter (Signed)
Jeanine, PT, [facility] left a message asking for verbal orders to extend HHPT 2 more weeks. Medical record reviewed. Social work note reviewed.  Verbal orders given per office protocol.

## 2018-09-25 ENCOUNTER — Telehealth: Payer: Self-pay | Admitting: *Deleted

## 2018-09-25 NOTE — Telephone Encounter (Signed)
[facility] OT called for ext 2wk2 POC. Approval given per protocol.

## 2018-10-02 ENCOUNTER — Other Ambulatory Visit: Payer: Self-pay

## 2018-10-02 ENCOUNTER — Encounter
Payer: No Typology Code available for payment source | Attending: Registered Nurse | Admitting: Physical Medicine & Rehabilitation

## 2018-10-02 ENCOUNTER — Encounter: Payer: Self-pay | Admitting: Physical Medicine & Rehabilitation

## 2018-10-02 ENCOUNTER — Ambulatory Visit (INDEPENDENT_AMBULATORY_CARE_PROVIDER_SITE_OTHER): Payer: No Typology Code available for payment source | Admitting: Internal Medicine

## 2018-10-02 ENCOUNTER — Encounter: Payer: Self-pay | Admitting: Internal Medicine

## 2018-10-02 VITALS — BP 145/63 | HR 70 | Wt >= 6400 oz

## 2018-10-02 DIAGNOSIS — I10 Essential (primary) hypertension: Secondary | ICD-10-CM | POA: Diagnosis not present

## 2018-10-02 DIAGNOSIS — S76112S Strain of left quadriceps muscle, fascia and tendon, sequela: Secondary | ICD-10-CM

## 2018-10-02 DIAGNOSIS — R21 Rash and other nonspecific skin eruption: Secondary | ICD-10-CM | POA: Diagnosis not present

## 2018-10-02 DIAGNOSIS — E119 Type 2 diabetes mellitus without complications: Secondary | ICD-10-CM

## 2018-10-02 DIAGNOSIS — R269 Unspecified abnormalities of gait and mobility: Secondary | ICD-10-CM

## 2018-10-02 DIAGNOSIS — G479 Sleep disorder, unspecified: Secondary | ICD-10-CM

## 2018-10-02 DIAGNOSIS — Z0000 Encounter for general adult medical examination without abnormal findings: Secondary | ICD-10-CM | POA: Diagnosis not present

## 2018-10-02 DIAGNOSIS — S76111S Strain of right quadriceps muscle, fascia and tendon, sequela: Secondary | ICD-10-CM

## 2018-10-02 DIAGNOSIS — Z0001 Encounter for general adult medical examination with abnormal findings: Secondary | ICD-10-CM

## 2018-10-02 DIAGNOSIS — G8929 Other chronic pain: Secondary | ICD-10-CM

## 2018-10-02 MED ORDER — OXYCODONE HCL 5 MG PO TABS: 5.0000 mg | ORAL_TABLET | Freq: Two times a day (BID) | ORAL | 0 refills | Status: DC | PRN

## 2018-10-02 MED ORDER — KETOCONAZOLE 2 % EX CREA: 1.0000 "application " | TOPICAL_CREAM | Freq: Every day | CUTANEOUS | 0 refills | Status: DC

## 2018-10-02 MED ORDER — SERTRALINE HCL 25 MG PO TABS: 25.0000 mg | ORAL_TABLET | Freq: Every day | ORAL | 3 refills | Status: DC

## 2018-10-02 MED ORDER — PANTOPRAZOLE SODIUM 40 MG PO TBEC: 40.0000 mg | DELAYED_RELEASE_TABLET | Freq: Every day | ORAL | 3 refills | Status: DC

## 2018-10-02 MED ORDER — SERTRALINE HCL 25 MG PO TABS: 25.0000 mg | ORAL_TABLET | Freq: Every day | ORAL | 0 refills | Status: DC

## 2018-10-02 MED ORDER — FLUCONAZOLE 100 MG PO TABS: 100.0000 mg | ORAL_TABLET | Freq: Every day | ORAL | 0 refills | Status: DC

## 2018-10-02 MED ORDER — NAPROXEN 500 MG PO TABS: 500.0000 mg | ORAL_TABLET | Freq: Two times a day (BID) | ORAL | 1 refills | Status: DC

## 2018-10-02 MED ORDER — METFORMIN HCL 850 MG PO TABS: 850.0000 mg | ORAL_TABLET | Freq: Every day | ORAL | 0 refills | Status: DC

## 2018-10-02 MED ORDER — METFORMIN HCL 850 MG PO TABS: 850.0000 mg | ORAL_TABLET | Freq: Every day | ORAL | 3 refills | Status: DC

## 2018-10-02 MED ORDER — ALBUTEROL SULFATE HFA 108 (90 BASE) MCG/ACT IN AERS: 2.0000 | INHALATION_SPRAY | Freq: Four times a day (QID) | RESPIRATORY_TRACT | 11 refills | Status: DC | PRN

## 2018-10-02 MED ORDER — PANTOPRAZOLE SODIUM 40 MG PO TBEC: 40.0000 mg | DELAYED_RELEASE_TABLET | Freq: Every day | ORAL | 0 refills | Status: DC

## 2018-10-02 MED FILL — FLUCONAZOLE 100 MG TAB: 100 | 7 days supply | Qty: 7 | Fill #0

## 2018-10-02 MED FILL — KETOCONAZOLE 2% CREAM: 2 | 15 days supply | Qty: 60 | Fill #0

## 2018-10-02 MED FILL — SERTRALINE HCL 25 MG TABLET: 25 | 90 days supply | Qty: 90 | Fill #0

## 2018-10-02 MED FILL — metFORMIN HCL 850 MG TABS: 850 | 90 days supply | Qty: 90 | Fill #0

## 2018-10-02 MED FILL — PANTOPRAZOLE SOD DR 40 MG T: 40 | 90 days supply | Qty: 90 | Fill #0

## 2018-10-02 MED FILL — oxyCODONE HCL 5 MG TABS: 5 | 30 days supply | Qty: 60 | Fill #0

## 2018-10-02 MED FILL — NAPROXEN 500 MG TABLET: 500 | 45 days supply | Qty: 90 | Fill #0

## 2018-10-02 NOTE — Assessment & Plan Note (Signed)
Ok for limited oxycodone prn, consider pain management

## 2018-10-02 NOTE — Progress Notes (Signed)
Virtual Visit via Video Note  I connected with Larry Fowler on 10/02/18 at  4:00 PM EDT by a video enabled telemedicine application and verified that I am speaking with the correct person using two identifiers. Pt is at home, I am in office, and wife is present off camera   I discussed the limitations of evaluation and management by telemedicine and the availability of in person appointments. The patient expressed understanding and agreed to proceed.  History of Present Illness: Here for wellness and f/u;  Overall doing ok;  Pt denies Chest pain, worsening SOB, DOE, wheezing, orthopnea, PND, worsening LE edema, palpitations, dizziness or syncope.  Pt denies neurological change such as new headache, facial or extremity weakness..  Pt denies worsening depressive symptoms, suicidal ideation or panic. No fever, night sweats, wt loss, loss of appetite, or other constitutional symptoms.  Pt states good ability with ADL's, has low fall risk, home safety reviewed and adequate, no other significant changes in hearing or vision, and only occasionally active with exercise.  Pt continues to have recurring bilateral leg pain s/p bilateral quadriceps tear  without change in severity, but no fever, wt loss,  worsening LE pain/numbness/weakness, gait change or falls.  Out of pain med, asks for refill.   Also with c/o groin rash some better with otc mycostatin but just cant get cleared up over the past month.   Pt denies polydipsia, polyuria, or low sugar symptoms such as weakness or confusion improved with po intake.  Pt states overall good compliance with meds, trying to follow lower cholesterol, diabetic diet, wt overall stable but little exercise however, still recovering from the quadriceps.  States lost over 60 lbs with recent injury, but regained about 5 lbs since returned home in the past month.  BP at home 134/60. CBGs 117-172 Past Medical History:  Diagnosis Date  . ALLERGIC RHINITIS 09/12/2007   Qualifier: Diagnosis of  By: Jenny Reichmann MD, Hunt Oris   . ANXIETY 02/16/2007   Qualifier: Diagnosis of  By: Reatha Armour, Lucy    . ASTHMA 12/17/2009   Qualifier: Diagnosis of  By: Jenny Reichmann MD, Hunt Oris   . Cannabis abuse    currently  . Chills with fever   . DEPRESSION 09/12/2007   Qualifier: Diagnosis of  By: Jenny Reichmann MD, Hunt Oris   . History of cocaine use    in his 86's  . Hyperglycemia   . Rectal fistula 2009  . Rectal pain   . Renal stone    2008   Past Surgical History:  Procedure Laterality Date  . FINGER SURGERY  2008/09?   partial amputation of 3rd finger right hand, and reattachment of right index finger - Dr Sharol Given  . knee surgury     ? which knee per Dr Noemi Chapel in his teens  . REPAIR QUADRICEPS/HAMSTRING MUSCLES Bilateral 07/21/2018   Procedure: REPAIR BILATERAL QUADRICEPS;  Surgeon: Leandrew Koyanagi, MD;  Location: [facility];  Service: Orthopedics;  Laterality: Bilateral;  . TONSILLECTOMY      reports that he has been smoking. He has been smoking about 0.25 packs per day. He has never used smokeless tobacco. He reports current alcohol use. He reports current drug use. Drug: Marijuana. family history includes Diabetes type II in his mother; Pneumonia in his father. No Known Allergies Current Outpatient Medications on File Prior to Visit  Medication Sig Dispense Refill  . acetaminophen (TYLENOL) 325 MG tablet Take 1-2 tablets (325-650 mg total) by mouth every 6 (six) hours as  needed for mild pain (pain score 1-3 or temp > 100.5).    Marland Kitchen Blood Glucose Monitoring Suppl (ONE TOUCH ULTRA 2) w/Device KIT Use as directed once daily E11.9 1 each 0  . budesonide-formoterol (SYMBICORT) 80-4.5 MCG/ACT inhaler Inhale 2 puffs into the lungs 2 (two) times daily. 1 Inhaler 6  . glucose blood (ONE TOUCH ULTRA TEST) test strip Use as instructed once daily E11.9 100 each 12  . guaiFENesin (MUCINEX) 600 MG 12 hr tablet Take 2 tablets (1,200 mg total) by mouth 2 (two) times daily. 60 tablet 0  . Lancets MISC Use as  directed once daily E11.9 100 each 12  . lidocaine (LIDODERM) 5 % Place 1 patch onto the skin daily as needed. Remove & Discard patch within 12 hours or as directed by MD 30 patch 0  . loratadine (CLARITIN) 10 MG tablet Take 1 tablet (10 mg total) by mouth daily. 30 tablet 0  . losartan (COZAAR) 25 MG tablet Take 1 tablet (25 mg total) by mouth daily. 30 tablet 0  . methocarbamol (ROBAXIN) 750 MG tablet Take 1 tablet (750 mg total) by mouth every 8 (eight) hours as needed for muscle spasms. (Patient taking differently: Take 750 mg by mouth every 8 (eight) hours as needed for muscle spasms. Awaiting mail order delivery of this medication) 60 tablet 0  . Multiple Vitamin (MULTIVITAMIN WITH MINERALS) TABS tablet Take 1 tablet by mouth daily.    . temazepam (RESTORIL) 15 MG capsule Take 1 capsule (15 mg total) by mouth at bedtime. (Patient taking differently: Take 15 mg by mouth at bedtime. Awaiting mail order delivery on this medication.) 10 capsule 0  . traZODone (DESYREL) 50 MG tablet Take 1 tablet (50 mg total) by mouth at bedtime. 10 tablet 0   No current facility-administered medications on file prior to visit.     Observations/Objective: Alert, obese, NAD, not ill appearing, resps normal, cn 2-12 intact, moves all 4s, no visible rash or swelling Lab Results  Component Value Date   WBC 7.4 08/29/2018   HGB 10.5 (L) 08/29/2018   HCT 34.3 (L) 08/29/2018   PLT 165 08/29/2018   GLUCOSE 140 (H) 08/28/2018   CHOL 150 07/26/2018   TRIG 107 07/26/2018   HDL 38 (L) 07/26/2018   LDLCALC 91 07/26/2018   ALT 26 08/02/2018   AST 29 08/02/2018   NA 138 08/28/2018   K 4.4 08/28/2018   CL 101 08/28/2018   CREATININE 0.71 08/28/2018   BUN 13 08/28/2018   CO2 27 08/28/2018   TSH 1.88 03/21/2017   PSA 0.16 03/21/2017   HGBA1C 7.1 (H) 07/24/2018   Assessment and Plan: See notes Follow Up Instructions: See notes   I discussed the assessment and treatment plan with the patient. The patient was  provided an opportunity to ask questions and all were answered. The patient agreed with the plan and demonstrated an understanding of the instructions.   The patient was advised to call back or seek an in-person evaluation if the symptoms worsen or if the condition fails to improve as anticipated.  Cathlean Cower, MD

## 2018-10-02 NOTE — Assessment & Plan Note (Signed)
stable overall by history and exam, recent data reviewed with pt, and pt to continue medical treatment as before,  to f/u any worsening symptoms or concerns  

## 2018-10-02 NOTE — Progress Notes (Signed)
Subjective:    Patient ID: Larry Fowler, male    DOB: 09-13-1970, 48 y.o.   MRN: [id number]  TELEHEALTH NOTE  Due to national recommendations of social distancing due to COVID 19, an audio/video telehealth visit is felt to be most appropriate for this patient at this time.  See Chart message from today for the patient's consent to telehealth from [facility].     I verified that I am speaking with the correct person using two identifiers.  Location of patient: Home Location of provider: Office Method of communication: Telephone Names of participants : Wadie Lessen scheduling, Angela Nevin obtaining consent and vitals if available Established patient Time spent on call: 20 minutes  HPI 48 year old right-handed male with history of OSA, tobacco use, anxiety/depression, asthma, morbid obesity presents for hospital follow-up after receiving CIR for bilateral quad tendon rupture status post repair.    He had a virtual office visit with NP, notes reviewed.  Since that time, he has not seen, nor has an appointment with Ortho. Denies falls. Pain has been relatively controlled. He has not followed with up with PCP. CBGs have been relatively controlled. Bowel movements are regular. Sleep is poor, states he lays in bed from midnight until 5AM, then goes to sleep.   Therapies: 2/week DME: Shower chair, bedside commode Mobility:  Walker at all times  Pain Inventory Average Pain 4 Pain Right Now 5 My pain is intermittent  In the last 24 hours, has pain interfered with the following? General activity 0 Relation with others 0 Enjoyment of life 0 What TIME of day is your pain at its worst? varies with activity Sleep (in general) Poor  Pain is worse with: walking, standing and some activites Pain improves with: medication Relief from Meds: 7  Mobility walk with assistance use a walker ability to climb steps?  no do you drive?  no  Function  employed # of hrs/week . 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 Diabetes type II Mother   . Pneumonia Father    Social History   Socioeconomic History  . Marital status: Married    Spouse name: Not on file  . Number of children: Not on file  . Years of education: Not on file  . Highest education level: Not on file  Occupational History  . Not on file  Social Needs  . Financial resource strain: Not on file  . Food insecurity:    Worry: Not on file    Inability: Not on file  . Transportation needs:    Medical: Not on file    Non-medical: Not on file  Tobacco Use  . Smoking status: Current Every Day Smoker    Packs/day: 0.25    Last attempt to quit: 03/01/2011    Years since quitting: 7.5  . Smokeless tobacco: Never Used  . Tobacco comment: doesn't smoke the whole cig, burns out while playing video game  Substance and Sexual Activity  . Alcohol use: Yes  . Drug use: Yes    Types: Marijuana  . Sexual activity: Not on file  Lifestyle  . Physical activity:    Days per week: Not on file    Minutes per session: Not on file  . Stress: Not on file  Relationships  . Social connections:    Talks  on phone: Not on file    Gets together: Not on file    Attends religious service: Not on file    Active member of club or organization: Not on file    Attends meetings of clubs or organizations: Not on file    Relationship status: Not on file  Other Topics Concern  . Not on file  Social History Narrative  . Not on file   Past Surgical History:  Procedure Laterality Date  . FINGER SURGERY  2008/09?   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 knee surgury     ? which knee per Dr Thurston Hole in his teens  . REPAIR QUADRICEPS/HAMSTRING MUSCLES Bilateral 07/21/2018   Procedure:  REPAIR BILATERAL QUADRICEPS;  Surgeon: Tarry Kos, MD;  Location: [facility] OR;  Service: Orthopedics;  Laterality: Bilateral;  . TONSILLECTOMY     Past Medical History:  Diagnosis Date  . ALLERGIC RHINITIS 09/12/2007   Qualifier: Diagnosis of  By: Jonny Ruiz MD, Len Blalock   . ANXIETY 02/16/2007   Qualifier: Diagnosis of  By: Tyrone Apple, Lucy    . ASTHMA 12/17/2009   Qualifier: Diagnosis of  By: Jonny Ruiz MD, Len Blalock   . Cannabis abuse    currently  . Chills with fever   . DEPRESSION 09/12/2007   Qualifier: Diagnosis of  By: Jonny Ruiz MD, Len Blalock   . History of cocaine use    in his 58's  . Hyperglycemia   . Rectal fistula 2009  . Rectal pain   . Renal stone    2008   Wt (!) 516 lb (234.1 kg)   BMI 66.25 kg/m   Opioid Risk Score:   Fall Risk Score:  `1  Depression screen PHQ 2/9  Depression screen [facility] 2/9 07/03/2018 10/25/2017 03/21/2017  Decreased Interest 0 0 0  Down, Depressed, Hopeless 0 0 1  PHQ - 2 Score 0 0 1    Review of Systems  Constitutional: Negative.   HENT: Negative.   Eyes: Negative.   Respiratory: Negative.   Cardiovascular: Negative.   Gastrointestinal: Negative.   Endocrine: Negative.   Genitourinary: Negative.   Musculoskeletal: Positive for arthralgias and gait problem.  Skin: Negative.   Allergic/Immunologic: Negative.   Hematological: Negative.   Psychiatric/Behavioral: Negative.   All other systems reviewed and are negative.      Objective:   Physical Exam Gen: NAD. Pulm: Effort normal Neuro: Alert and oriented    Assessment & Plan:  48 year old right-handed male with history of OSA, tobacco use, anxiety/depression, asthma, morbid obesity presents for hospital follow-up after receiving CIR for bilateral quad tendon rupture status post repair.   1. Decreased functional mobility secondary to bilateral quadricep tendon rupture. Status post repair of bilateral tendon rupture repair 07/21/2018. Weightbearing as tolerated.   Cont therapies  Needs to follow up  with Ortho- needs appointment  Encouraged follow up with PCP regarding medication management - refilled limited supply until PCP, educated again  2. Pain Management:    Cont PRN meds  Educated on weaning narcotics  3.  Super super obesity.   States he is doing very well with weight loss, ?has gained about 1 pound  4. New findingsType 2 diabetes mellitus.   Cont meds  Relatively controlled per patient  5. Gait abnormlaity  Cont therapies  Cont walker for safety  6. Sleep disturbance  Educated on sleep hygiene  Trial Melatonin  Meds reviewed Referrals reviewed - needs  PCP and Ortho referral All questions answered

## 2018-10-02 NOTE — Patient Instructions (Signed)
Please take all new medication as prescribed - the diflucan, and ketoconazole cream  Please continue all other medications as before, and refills have been done if requested- oxycodone.  Please have the pharmacy call with any other refills you may need.  Please continue your efforts at being more active, low cholesterol diet, and weight control.  You are otherwise up to date with prevention measures today.  Please keep your appointments with your specialists as you may have planned  We can hold on further lab work for now  Please return in 6 months, or sooner if needed, with Lab testing done 3-5 days before

## 2018-10-02 NOTE — Assessment & Plan Note (Signed)
stable overall by history and exam, recent data reviewed with pt, and pt to continue medical treatment as before,  to f/u any worsening symptoms or concerns, f/u rov 6 mo with labs prior

## 2018-10-02 NOTE — Assessment & Plan Note (Signed)
Mild to mod, for diflucan asd, ketoconozole cream,  to f/u any worsening symptoms or concerns  In addition to the time spent performing CPE, I spent an additional 25 minutes face to face,in which greater than 50% of this time was spent in counseling and coordination of care for patient's acute illness as documented, including the differential dx, treatment, further evaluation and other management of rash, chronic pain, DM new onset, HTN

## 2018-10-02 NOTE — Assessment & Plan Note (Signed)

## 2018-10-03 ENCOUNTER — Other Ambulatory Visit: Payer: Self-pay | Admitting: Internal Medicine

## 2018-10-03 ENCOUNTER — Telehealth: Payer: Self-pay | Admitting: *Deleted

## 2018-10-03 MED ORDER — NAPROXEN 500 MG PO TABS: 500.0000 mg | ORAL_TABLET | Freq: Two times a day (BID) | ORAL | 1 refills | Status: DC

## 2018-10-03 NOTE — Telephone Encounter (Signed)
Amber, PT, [facility] left a message asking for verbal orders to continue home health PT 2week2.  Medical record reviewed. Social work note reviewed.  Verbal orders given per office protocol.

## 2018-10-06 ENCOUNTER — Ambulatory Visit (INDEPENDENT_AMBULATORY_CARE_PROVIDER_SITE_OTHER): Payer: No Typology Code available for payment source | Admitting: Physician Assistant

## 2018-10-06 ENCOUNTER — Other Ambulatory Visit: Payer: Self-pay

## 2018-10-06 ENCOUNTER — Encounter (INDEPENDENT_AMBULATORY_CARE_PROVIDER_SITE_OTHER): Payer: Self-pay | Admitting: Orthopaedic Surgery

## 2018-10-06 DIAGNOSIS — S76119D Strain of unspecified quadriceps muscle, fascia and tendon, subsequent encounter: Secondary | ICD-10-CM

## 2018-10-06 NOTE — Progress Notes (Signed)
Post-Op Visit Note   Patient: Larry Fowler           Date of Birth: January 12, 1971           MRN: [id number] Visit Date: 10/06/2018 PCP: [name], MD   Assessment & Plan:  Chief Complaint:  Chief Complaint  Patient presents with  . Right Knee - Follow-up  . Left Knee - Follow-up   Visit Diagnoses:  1. Rupture of quadriceps tendon, unspecified laterality, subsequent encounter     Plan: Patient is a 47 year old gentleman who presents our clinic today 77 days status post bilateral quadriceps repair, date of surgery 07/21/2018.  He was discharged home with home health physical therapy from [facility] inpatient rehab little over a month ago.  He has done well.  He has minimal pain which only occurs with exercise.  He does note that he has not been wearing his braces for the past 3 weeks.  Overall, doing well and ready to progress to outpatient physical therapy.  Examination of both legs reveals well healed surgical incisions without infection.  Range of motion 0 to 95 degrees.  He is able to fully straight leg raise both sides.  He is neurovascularly intact distally.  At this point, we will send him to formal physical therapy.  A prescription was provided today for this.  He will follow-up with [place] in 2 months time for recheck.  Call with concerns or questions.  Follow-Up Instructions: Return in about 2 months (around 12/06/2018).   Orders:  No orders of the defined types were placed in this encounter.  No orders of the defined types were placed in this encounter.   Imaging: No new imaging   PMFS History: Patient Active Problem List   Diagnosis Date Noted  . Post-operative pain   . Congestion of both ears   . Pruritus   . Episode of recurrent major depressive disorder (HCC)   . Labile blood glucose   . Sore throat   . Sleep disturbance   . Labile blood pressure   . Hypoalbuminemia due to protein-calorie malnutrition (HCC)   . New onset type 2 diabetes mellitus (HCC)   .  Marijuana abuse   . Drug induced constipation   . Otitis media   . Acute blood loss anemia   . Pressure injury of skin 07/30/2018  . Anxiety and depression   . Super-super obese (HCC)   . Postoperative pain   . Tobacco abuse   . Postoperative anemia due to acute blood loss 07/23/2018    Class: Acute  . Rupture of left quadriceps muscle   . Quadriceps tendon rupture 07/20/2018  . Nutritional counseling 07/03/2018  . Disorder of tendon of left biceps 12/05/2017  . Hypogonadism in male 10/25/2017  . Diabetes (HCC)   . Encounter for well adult exam with abnormal findings 03/21/2017  . Morbid obesity (HCC) 03/21/2017  . OSA (obstructive sleep apnea) 03/21/2017  . Chronic pain 03/21/2017  . Smoker 03/21/2017  . HTN (hypertension) 03/21/2017  . Fatigue 03/21/2017  . Left inguinal hernia 03/21/2017  . Rash 03/21/2017  . History of cocaine use   . Cannabis abuse   . Renal stone   . Anal abscess, ? fistula 04/05/2011  . Asthma 12/17/2009  . 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 Allergic rhinitis 09/12/2007  . ANXIETY 02/16/2007   Past Medical History:  Diagnosis Date  . ALLERGIC RHINITIS 09/12/2007   Qualifier: Diagnosis of  By: Jonny Ruiz MD, Len Blalock   .  ANXIETY 02/16/2007   Qualifier: Diagnosis of  By: Tyrone AppleBrand RMA, Lucy    . ASTHMA 12/17/2009   Qualifier: Diagnosis of  By: Jonny RuizJohn MD, Len BlalockJames W   . Cannabis abuse    currently  . Chills with fever   . DEPRESSION 09/12/2007   Qualifier: Diagnosis of  By: Jonny RuizJohn MD, Len BlalockJames W   . History of cocaine use    in his 120's  . Hyperglycemia   . Rectal fistula 2009  . Rectal pain   . Renal stone    2008    Family History  Problem Relation Age of Onset  . Diabetes type II Mother   . Pneumonia Father     Past Surgical History:  Procedure Laterality Date  . FINGER SURGERY  2008/09?   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 knee surgury     ? which knee per Dr Thurston HoleWainer in his teens  .  REPAIR QUADRICEPS/HAMSTRING MUSCLES Bilateral 07/21/2018   Procedure: REPAIR BILATERAL QUADRICEPS;  Surgeon: Tarry KosXu, Naiping M, MD;  Location: [facility] OR;  Service: Orthopedics;  Laterality: Bilateral;  . TONSILLECTOMY     Social History   Occupational History  . Not on file  Tobacco Use  . Smoking status: Current Every Day Smoker    Packs/day: 0.25    Last attempt to quit: 03/01/2011    Years since quitting: 7.6  . Smokeless tobacco: Never Used  . Tobacco comment: doesn't smoke the whole cig, burns out while playing video game  Substance and Sexual Activity  . Alcohol use: Yes  . Drug use: Yes    Types: Marijuana  . Sexual activity: Not on file

## 2018-10-06 NOTE — Addendum Note (Signed)
Addended by: Mardene Celeste B on: 10/06/2018 11:17 AM   Modules accepted: Orders

## 2018-10-10 ENCOUNTER — Encounter: Payer: Self-pay | Admitting: Internal Medicine

## 2018-10-10 MED ORDER — FLUTICASONE PROPIONATE 50 MCG/ACT NA SUSP: 2.0000 | Freq: Every day | NASAL | 3 refills | Status: DC

## 2018-10-12 ENCOUNTER — Telehealth: Payer: Self-pay | Admitting: *Deleted

## 2018-10-12 NOTE — Telephone Encounter (Signed)
Beaulah Corin, OT, [facility] left a message asking for verbal orders for home health OT to make up missed visit. Medical record reviewed. Social work note reviewed.  Verbal orders given per office protocol.

## 2018-10-13 ENCOUNTER — Encounter: Payer: Self-pay | Admitting: Orthopaedic Surgery

## 2018-10-14 MED FILL — FLUTICASONE PROP 50 MCG SPR: 50 | 30 days supply | Qty: 16 | Fill #0

## 2018-10-18 ENCOUNTER — Telehealth: Payer: Self-pay | Admitting: Orthopaedic Surgery

## 2018-10-18 NOTE — Telephone Encounter (Signed)
Received call from [facility] with [facility] needing  Referral orders faxed over to [facility] outpatient PT on [address].  The fax# is [phone]   The number to contact Tresa Endo is [phone]

## 2018-10-19 NOTE — Telephone Encounter (Signed)
The order is in [facility]

## 2018-10-21 MED FILL — ALBUTEROL SULFATE HFA 108 (: 108 (90 BAS | 25 days supply | Qty: 18 | Fill #0 | Status: TO

## 2018-10-23 ENCOUNTER — Encounter: Payer: Self-pay | Admitting: Internal Medicine

## 2018-10-23 MED ORDER — KETOCONAZOLE 2 % EX CREA: 1.0000 "application " | TOPICAL_CREAM | Freq: Every day | CUTANEOUS | 1 refills | Status: DC

## 2018-10-23 MED FILL — KETOCONAZOLE 2% CREAM: 2 | 30 days supply | Qty: 60 | Fill #0

## 2018-10-24 ENCOUNTER — Ambulatory Visit: Payer: No Typology Code available for payment source | Attending: Orthopaedic Surgery | Admitting: Physical Therapy

## 2018-10-24 ENCOUNTER — Other Ambulatory Visit: Payer: Self-pay

## 2018-10-24 ENCOUNTER — Encounter: Payer: Self-pay | Admitting: Physical Therapy

## 2018-10-24 DIAGNOSIS — M25562 Pain in left knee: Secondary | ICD-10-CM

## 2018-10-24 DIAGNOSIS — M6281 Muscle weakness (generalized): Secondary | ICD-10-CM | POA: Diagnosis present

## 2018-10-24 DIAGNOSIS — R262 Difficulty in walking, not elsewhere classified: Secondary | ICD-10-CM

## 2018-10-24 DIAGNOSIS — M25561 Pain in right knee: Secondary | ICD-10-CM

## 2018-10-24 NOTE — Patient Instructions (Signed)
Access Code: [facility]  URL: [email or web address]  Date: 10/24/2018  Prepared by: Ivery Quale   Exercises  Seated Long Arc Quad - 10 reps - 2-3 sets - 2x daily - 6x weekly  Standing Hip Abduction - 10 reps - 1-3 sets - 2x daily - 6x weekly  Sit to Stand with Counter Support - 10 reps - 3 sets - 2x daily - 6x weekly  Mini Squats with Walker and Chair - 10 reps - 3 sets - 2x daily - 6x weekly  Side Stepping with Resistance at Thighs and Counter Support - 10 reps - 3 sets - 2x daily - 6x weekly  Seated Knee Flexion Slide - 10 reps - 3 sets - 2x daily - 6x weekly

## 2018-10-24 NOTE — Therapy (Signed)
[facility] [address] [place], [place], [address] Phone: [phone]   Fax:  [phone]  Physical Therapy Evaluation  Patient Details  Name: Larry Fowler MRN: [id number] Date of Birth: [birth year] Referring Provider (PT): Tarry Kos, MD,   Encounter Date: 10/24/2018  PT End of Session - 10/24/18 1307    Visit Number  1    Number of Visits  24    Date for PT Re-Evaluation  11/21/18    Authorization Type  [facility] focus plan    PT Start Time  1210    PT Stop Time  1300    PT Time Calculation (min)  50 min    Activity Tolerance  Patient tolerated treatment well    Behavior During Therapy  [facility] for tasks assessed/performed       Past Medical History:  Diagnosis Date  . ALLERGIC RHINITIS 09/12/2007   Qualifier: Diagnosis of  By: Jonny Ruiz MD, Len Blalock   . ANXIETY 02/16/2007   Qualifier: Diagnosis of  By: Tyrone Apple, Lucy    . ASTHMA 12/17/2009   Qualifier: Diagnosis of  By: Jonny Ruiz MD, Len Blalock   . Cannabis abuse    currently  . Chills with fever   . DEPRESSION 09/12/2007   Qualifier: Diagnosis of  By: Jonny Ruiz MD, Len Blalock   . History of cocaine use    in his 5's  . Hyperglycemia   . Rectal fistula 2009  . Rectal pain   . Renal stone    2008    Past Surgical History:  Procedure Laterality Date  . FINGER SURGERY  2008/09?   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 knee surgury     ? which knee per Dr Thurston Hole in his teens  . REPAIR QUADRICEPS/HAMSTRING MUSCLES Bilateral 07/21/2018   Procedure: REPAIR BILATERAL QUADRICEPS;  Surgeon: Tarry Kos, MD;  Location: [facility] OR;  Service: Orthopedics;  Laterality: Bilateral;  . TONSILLECTOMY      There were no vitals filed for this visit.   Subjective Assessment - 10/24/18 1259    Subjective  Pt relays he slipped and fell causing bilat quad tendon tears and he had them repaired on 07/21/18. He spent 43 days in the hospital and had  inpatient rehab, then he had HHPT and is now referred to outpatient PT. He relays he has limited standing and walking tolerance, and difficulty with transfers. He says pain is mostly in his anterior knees    Pertinent History  48 year old right-handed male with history of OSA, tobacco use, anxiety/depression, asthma, morbid obesity     Limitations  Lifting;Standing;Walking;House hold activities    How long can you stand comfortably?  5 min    How long can you walk comfortably?  80 ft with RW but then wiped out    Currently in Pain?  Yes    Pain Score  3     Pain Location  Knee    Pain Orientation  Right;Left;Anterior    Pain Descriptors / Indicators  Aching    Pain Type  Surgical pain    Pain Onset  More than a month ago    Pain Frequency  Intermittent    Aggravating Factors   walking, standing    Pain Relieving Factors  meds, THC, rest         [facility] PT Assessment - 10/24/18 0001      Assessment   Medical Diagnosis  bilat quadriceps repair S/P 07/21/18    Referring Provider (PT)  Tarry Kos, MD,    Onset Date/Surgical Date  07/21/18    Prior Therapy  had acute PT then inpt rehab at [facility] until 08/30/18, then HHPT      Restrictions   Other Position/Activity Restrictions  WBAT      Balance Screen   Has the patient fallen in the past 6 months  Yes    How many times?  1   the one that caused this injury   Has the patient had a decrease in activity level because of a fear of falling?   No    Is the patient reluctant to leave their home because of a fear of falling?   No      Home Public house manager residence    Additional Comments  ramp to enter      Prior Function   Level of Independence  Independent    Vocation Requirements  dog grooming    Leisure  ride ATV, fish, videogames      Cognition   Overall Cognitive Status  Within Functional Limits for tasks assessed      Observation/Other Assessments   Focus on Therapeutic Outcomes (FOTO)   69% limited       Sensation   Additional Comments  has sensaiton, incisions healed with no signs of infections      ROM / Strength   AROM / PROM / Strength  AROM;PROM;Strength      AROM   AROM Assessment Site  Knee    Right/Left Knee  Right;Left    Right Knee Extension  0    Right Knee Flexion  95    Left Knee Extension  0    Left Knee Flexion  94      PROM   PROM Assessment Site  --      Strength   Overall Strength Comments  bilat hip/knee strength 4/5 MMT grossly tested in sitting      Palpation   Palpation comment  TTP in distal quads      Transfers   Transfers  Stand to Sit;Stand Pivot Transfers    Five time sit to stand comments   1:07, uses RW and pushes up from wheelchair rests    Stand to Sit  5: Supervision    Stand Pivot Transfers  5: Supervision    Comments  must use RW as he can not stand fully upright      Ambulation/Gait   Ambulation/Gait  Yes    Ambulation/Gait Assistance  5: Supervision    Ambulation Distance (Feet)  84 Feet    Assistive device  Rolling walker    Gait Comments  short steps, wide BOS, needs 2 standing rest breaks                Objective measurements completed on examination: See above findings.              PT Education - 10/24/18 1307    Education Details  HEP, POC    Person(s) Educated  Patient    Methods  Demonstration;Explanation    Comprehension  Verbalized understanding       PT Short Term Goals - 10/24/18 1317      PT SHORT TERM GOAL #1   Title  Pt will be I and compliant with HEP 4 weeks 11/25/18    Status  New      PT SHORT TERM  GOAL #2   Title  Pt will improve 5TSTS test from 1:07 to less than one minute. 4 weeks 11/25/18        PT Long Term Goals - 10/24/18 1318      PT LONG TERM GOAL #1   Title  Pt will improve 5TSTS test to less than 30 seconds. 8 weeks 11/21/18    Baseline  1:07 on eval    Time  8    Period  Weeks    Status  New      PT LONG TERM GOAL #2   Title  Pt will improve bilat knee  strength to 5/5 MMT    Time  8    Period  Weeks      PT LONG TERM GOAL #3   Title  Pt will improve bilat knee ROM to >110 deg to improve functional mobiliy    Time  8    Period  Weeks    Status  New      PT LONG TERM GOAL #4   Title  Pt will be able to ambulate at least 100 ft with LRAD.    Baseline  84 ft using RW    Time  8    Period  Weeks      PT LONG TERM GOAL #5   Title  Pt will improve FOTO to less than 55% limited.    Baseline  69    Time  8    Period  Weeks             Plan - 10/24/18 1309    Clinical Impression Statement  Pt presents with bilat quadriceps repair S/P 07/21/18. He is WBAT, and now uses RW for short ambulation. He was independent PLOF with no AD. Now he has to get assistance with some ADL's but he can independently transfer himself. He uses wheelchair for most of his locomotion. He has overall decreased activity and standing tolerance, decreaesd endurance, decreased LE strength, decreased knee ROM, decreased core strength and increased pain limiting his functional mobility. He will benefit from skilled PT to address his defecits.     Personal Factors and Comorbidities  Comorbidity 1;Comorbidity 2;Fitness    Comorbidities  48 year old right-handed male with history of OSA, tobacco use, anxiety/depression, asthma, morbid obesity 450 lbs    Examination-Activity Limitations  Bathing;Hygiene/Grooming;Squat;Stairs;Locomotion Level;Stand;Toileting;Transfers;Carry    Examination-Participation Restrictions  Laundry;Shop;Cleaning;Meal Prep;Community Activity;Driving    Stability/Clinical Decision Making  Evolving/Moderate complexity    Clinical Decision Making  Moderate    Rehab Potential  Good    PT Frequency  Other (comment)   2-3   PT Duration  8 weeks    PT Treatment/Interventions  Aquatic Therapy;Cryotherapy;Electrical Stimulation;Moist Heat;Ultrasound;DME Instruction;Gait training;Stair training;Functional mobility training;Therapeutic  activities;Therapeutic exercise;Balance training;Neuromuscular re-education;Orthotic Fit/Training;Wheelchair mobility training;Manual techniques;Passive range of motion;Dry needling;Energy conservation;Taping;Joint Manipulations    PT Next Visit Plan  general quad and LE strength, knee flexion ROM, standing and ambulaiton tolerance    PT Home Exercise Plan  sit to stands, LAQ, seated heel slides, standing hip abd, sidestepping at counter, mini squats at counter    Consulted and Agree with Plan of Care  Patient       Patient will benefit from skilled therapeutic intervention in order to improve the following deficits and impairments:  Abnormal gait, Decreased activity tolerance, Decreased endurance, Decreased range of motion, Decreased strength, Increased fascial restricitons, Pain, Postural dysfunction, Obesity  Visit Diagnosis: Acute pain of right knee  Acute pain of left knee  Muscle weakness (generalized)  Difficulty in walking, not elsewhere classified     Problem List Patient Active Problem List   Diagnosis Date Noted  . Post-operative pain   . Congestion of both ears   . Pruritus   . Episode of recurrent major depressive disorder (HCC)   . Labile blood glucose   . Sore throat   . Sleep disturbance   . Labile blood pressure   . Hypoalbuminemia due to protein-calorie malnutrition (HCC)   . New onset type 2 diabetes mellitus (HCC)   . Marijuana abuse   . Drug induced constipation   . Otitis media   . Acute blood loss anemia   . Pressure injury of skin 07/30/2018  . Anxiety and depression   . Super-super obese (HCC)   . Postoperative pain   . Tobacco abuse   . Postoperative anemia due to acute blood loss 07/23/2018    Class: Acute  . Rupture of left quadriceps muscle   . Quadriceps tendon rupture 07/20/2018  . Nutritional counseling 07/03/2018  . Disorder of tendon of left biceps 12/05/2017  . Hypogonadism in male 10/25/2017  . Diabetes (HCC)   . Encounter for  well adult exam with abnormal findings 03/21/2017  . Morbid obesity (HCC) 03/21/2017  . OSA (obstructive sleep apnea) 03/21/2017  . Chronic pain 03/21/2017  . Smoker 03/21/2017  . HTN (hypertension) 03/21/2017  . Fatigue 03/21/2017  . Left inguinal hernia 03/21/2017  . Rash 03/21/2017  . History of cocaine use   . Cannabis abuse   . Renal stone   . Anal abscess, ? fistula 04/05/2011  . Asthma 12/17/2009  . 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 Allergic rhinitis 09/12/2007  . ANXIETY 02/16/2007    April Manson ,PT,DPT 10/24/2018, 1:31 PM  [facility] [address] [place], [place], [address] Phone: [phone]   Fax:  [phone]  Name: Larry Fowler MRN: [id number] Date of Birth: [birth year]

## 2018-11-08 MED FILL — ALBUTEROL SULFATE HFA 108 (: 108 (90 BAS | 25 days supply | Qty: 18 | Fill #0

## 2018-11-13 ENCOUNTER — Encounter
Payer: No Typology Code available for payment source | Attending: Registered Nurse | Admitting: Physical Medicine & Rehabilitation

## 2018-11-13 ENCOUNTER — Encounter: Payer: Self-pay | Admitting: Physical Medicine & Rehabilitation

## 2018-11-13 ENCOUNTER — Other Ambulatory Visit: Payer: Self-pay

## 2018-11-13 VITALS — Ht 75.0 in | Wt >= 6400 oz

## 2018-11-13 DIAGNOSIS — R269 Unspecified abnormalities of gait and mobility: Secondary | ICD-10-CM

## 2018-11-13 DIAGNOSIS — E119 Type 2 diabetes mellitus without complications: Secondary | ICD-10-CM

## 2018-11-13 DIAGNOSIS — S76111A Strain of right quadriceps muscle, fascia and tendon, initial encounter: Secondary | ICD-10-CM

## 2018-11-13 DIAGNOSIS — S76111S Strain of right quadriceps muscle, fascia and tendon, sequela: Secondary | ICD-10-CM

## 2018-11-13 DIAGNOSIS — G479 Sleep disorder, unspecified: Secondary | ICD-10-CM

## 2018-11-13 DIAGNOSIS — S76112S Strain of left quadriceps muscle, fascia and tendon, sequela: Secondary | ICD-10-CM

## 2018-11-13 NOTE — Progress Notes (Signed)
Subjective:    Patient ID: Larry Fowler, male    DOB: [birth year], 48 y.o.   MRN: [id number]  TELEHEALTH NOTE  Due to national recommendations of social distancing due to COVID 19, an audio/video telehealth visit is felt to be most appropriate for this patient at this time.  See Chart message from today for the patient's consent to telehealth from [place].     I verified that I am speaking with the correct person using two identifiers.  Location of patient: Home Location of provider: Office Method of communication: Telephone Names of participants : Zorita Pang scheduling, Marland Mcalpine obtaining consent and vitals if available Established patient Time spent on call: 10 minutes   HPI 48 year old right-handed male with history of OSA, tobacco use, anxiety/depression, asthma, morbid obesity presents for hospital follow-up for bilateral quad tendon rupture status post repair.    Last clinic visit on 10/02/2018.  Since that time, he stopped going to therapies, states he needs to call and schedule more. He saw Ortho - good healing, states he has a follow up.  He saw a PCP. Pain is present, but relatively controlled. He complains of BPPV like symptoms. Has not lost weight. CBGs relatively controlled. Denies falls.  Continues walker. Sleep has improved.   Pain Inventory Average Pain 0 Pain Right Now 0 My pain is na  In the last 24 hours, has pain interfered with the following? General activity 0 Relation with others 0 Enjoyment of life 0 What TIME of day is your pain at its worst? daytime Sleep (in general) Good  Pain is worse with: walking, bending, standing, unsure and some activites Pain improves with: medication Relief from Meds: 7  Mobility walk with assistance use a walker ability to climb steps?  no do you drive?  no  Function employed # of hrs/week . 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  no  Physicians involved in your care Any changes since last visit?  no   Family History  Problem Relation Age of Onset  . Diabetes type II Mother   . Pneumonia Father    Social History   Socioeconomic History  . Marital status: Married    Spouse name: Not on file  . Number of children: Not on file  . Years of education: Not on file  . Highest education level: Not on file  Occupational History  . Not on file  Social Needs  . Financial resource strain: Not on file  . Food insecurity:    Worry: Not on file    Inability: Not on file  . Transportation needs:    Medical: Not on file    Non-medical: Not on file  Tobacco Use  . Smoking status: Former Smoker    Packs/day: 0.25    Last attempt to quit: 03/01/2011    Years since quitting: 7.7  . Smokeless tobacco: Never Used  . Tobacco comment: doesn't smoke the whole cig, burns out while playing video game  Substance and Sexual Activity  . Alcohol use: Yes  . Drug use: Yes    Types: Marijuana  . Sexual activity: Not on file  Lifestyle  . Physical activity:    Days per week: Not on file    Minutes per session: Not on file  . Stress: Not on file  Relationships  . Social connections:    Talks on phone: Not  on file    Gets together: Not on file    Attends religious service: Not on file    Active member of club or organization: Not on file    Attends meetings of clubs or organizations: Not on file    Relationship status: Not on file  Other Topics Concern  . Not on file  Social History Narrative  . Not on file   Past Surgical History:  Procedure Laterality Date  . FINGER SURGERY  2008/09?   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 knee surgury     ? which knee per Dr Thurston HoleWainer in his teens  . REPAIR QUADRICEPS/HAMSTRING MUSCLES Bilateral 07/21/2018   Procedure: REPAIR BILATERAL QUADRICEPS;  Surgeon:  Tarry KosXu, Naiping M, MD;  Location: [facility] OR;  Service: Orthopedics;  Laterality: Bilateral;  . TONSILLECTOMY     Past Medical History:  Diagnosis Date  . ALLERGIC RHINITIS 09/12/2007   Qualifier: Diagnosis of  By: Jonny RuizJohn MD, Len BlalockJames W   . ANXIETY 02/16/2007   Qualifier: Diagnosis of  By: Tyrone AppleBrand RMA, Lucy    . ASTHMA 12/17/2009   Qualifier: Diagnosis of  By: Jonny RuizJohn MD, Len BlalockJames W   . Cannabis abuse    currently  . Chills with fever   . DEPRESSION 09/12/2007   Qualifier: Diagnosis of  By: Jonny RuizJohn MD, Len BlalockJames W   . History of cocaine use    in his 4020's  . Hyperglycemia   . Rectal fistula 2009  . Rectal pain   . Renal stone    2008   Ht 6\' 3"  (1.905 m)   Wt (!) 487 lb (220.9 kg)   BMI 60.87 kg/m   Opioid Risk Score:   Fall Risk Score:  `1  Depression screen PHQ 2/9  [facility] 10/02/2018 07/03/2018 10/25/2017 03/21/2017  Decreased Interest 0 0 0 0  Down, Depressed, Hopeless 0 0 0 1  PHQ - 2 Score 0 0 0 1    Review of Systems  Constitutional: Negative.   HENT: Negative.   Eyes: Negative.   Respiratory: Negative.   Cardiovascular: Negative.   Gastrointestinal: Negative.   Endocrine: Negative.   Genitourinary: Negative.   Musculoskeletal: Positive for gait problem and myalgias.  Skin: Negative.   Allergic/Immunologic: Negative.   Neurological: Positive for dizziness and weakness.  Hematological: Negative.   Psychiatric/Behavioral: Negative.   All other systems reviewed and are negative.      Objective:   Physical Exam Gen: NAD. Pulm: Effort normal Neuro: Alert and oriented    Assessment & Plan:  48 year old right-handed male with history of OSA, tobacco use, anxiety/depression, asthma, morbid obesity presents for hospital follow-up for bilateral quad tendon rupture status post repair.  1. Decreased functional mobility secondary to bilateral quadricep tendon rupture. Status post repair of bilateral tendon rupture repair 07/21/2018. Weightbearing as tolerated.   Encouraged  therapies - stopped going  Follow up with Ortho  2. Pain Management:    Cont PRN meds, relatively controlled  3.  Super super obesity.   Has not lost weight  4. New findingsType 2 diabetes mellitus.   Cont meds  Relatively controlled per patient  5. Gait abnormlaity  Resumed therapies - educated  Cont walker for safety  6. Sleep disturbance  Cont CPAP

## 2018-11-20 ENCOUNTER — Ambulatory Visit: Payer: Self-pay | Admitting: Pulmonary Disease

## 2018-11-22 ENCOUNTER — Ambulatory Visit: Payer: No Typology Code available for payment source | Admitting: Physical Therapy

## 2018-11-22 ENCOUNTER — Ambulatory Visit: Payer: No Typology Code available for payment source | Attending: Orthopaedic Surgery | Admitting: Physical Therapy

## 2018-11-22 ENCOUNTER — Encounter: Payer: Self-pay | Admitting: Internal Medicine

## 2018-11-22 ENCOUNTER — Other Ambulatory Visit: Payer: Self-pay

## 2018-11-22 DIAGNOSIS — M6281 Muscle weakness (generalized): Secondary | ICD-10-CM | POA: Insufficient documentation

## 2018-11-22 DIAGNOSIS — M25561 Pain in right knee: Secondary | ICD-10-CM | POA: Insufficient documentation

## 2018-11-22 DIAGNOSIS — M25562 Pain in left knee: Secondary | ICD-10-CM | POA: Diagnosis present

## 2018-11-22 DIAGNOSIS — R262 Difficulty in walking, not elsewhere classified: Secondary | ICD-10-CM | POA: Insufficient documentation

## 2018-11-22 NOTE — Therapy (Signed)
[facility] [address] [place], [place], [address] Phone: [phone]   Fax:  [phone]  Physical Therapy Treatment  Patient Details  Name: Larry CoveRaymond S Fowler MRN: [id number] Date of Birth: 10/04/1970 Referring Provider (PT): Tarry KosXu, Naiping M, MD,   Encounter Date: 11/22/2018  PT End of Session - 11/22/18 1305    Visit Number  2    Number of Visits  24    Date for PT Re-Evaluation  01/17/19    Authorization Type  [facility] plan    PT Start Time  1243   pt. late for 12:30 appointment      Past Medical History:  Diagnosis Date  . ALLERGIC RHINITIS 09/12/2007   Qualifier: Diagnosis of  By: Jonny RuizJohn MD, Len BlalockJames W   . ANXIETY 02/16/2007   Qualifier: Diagnosis of  By: Tyrone AppleBrand RMA, Lucy    . ASTHMA 12/17/2009   Qualifier: Diagnosis of  By: Jonny RuizJohn MD, Len BlalockJames W   . Cannabis abuse    currently  . Chills with fever   . DEPRESSION 09/12/2007   Qualifier: Diagnosis of  By: Jonny RuizJohn MD, Len BlalockJames W   . History of cocaine use    in his 2320's  . Hyperglycemia   . Rectal fistula 2009  . Rectal pain   . Renal stone    2008    Past Surgical History:  Procedure Laterality Date  . FINGER SURGERY  2008/09?   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 knee surgury     ? which knee per Dr Thurston HoleWainer in his teens  . REPAIR QUADRICEPS/HAMSTRING MUSCLES Bilateral 07/21/2018   Procedure: REPAIR BILATERAL QUADRICEPS;  Surgeon: Tarry KosXu, Naiping M, MD;  Location: [facility] OR;  Service: Orthopedics;  Laterality: Bilateral;  . TONSILLECTOMY      There were no vitals filed for this visit.  Subjective Assessment - 11/22/18 1241    Subjective  Pt. returns for first PT visit since eval 10/24/18-encouraged by MD to resume/continue therapy at visit last week. Pt. able to ambulate brief household distances with RW but otherwise using wheelchair. He reports difficulty driving has been limiting factor on attendance. Performing HEP but  often with fewer reps than on handout. He reports continued limittaion of walking ability/tolerance and difficulty standing up straight.    Pertinent History  48 year old right-handed male with history of OSA, tobacco use, anxiety/depression, asthma, morbid obesity     Limitations  Lifting;Standing;Walking;House hold activities    How long can you stand comfortably?  5 min    How long can you walk comfortably?  80 ft with RW but then wiped out    Patient Stated Goals  to be able to walk without a RW    Currently in Pain?  Yes    Pain Score  5     Pain Location  Knee    Pain Orientation  Right;Left    Pain Descriptors / Indicators  Aching    Pain Type  Surgical pain    Pain Onset  More than a month ago    Pain Frequency  Intermittent    Aggravating Factors   walking and standing    Pain Relieving Factors  medication, rest         [facility] PT Assessment - 11/22/18 0001      AROM   Overall AROM Comments  knee ROM measured in supine    Right/Left Knee  Right;Left    Right  Knee Extension  0    Right Knee Flexion  104    Left Knee Extension  0    Left Knee Flexion  98      Strength   Overall Strength Comments  Bilat. hip and knee strength grossly 5/5      Transfers   Transfers  Sit to Stand    Five time sit to stand comments   28.33 sec from high low table set to height of w/c    Stand to Sit  5: Supervision    Stand Pivot Transfers  5: Supervision      Ambulation/Gait   Ambulation/Gait  Yes    Ambulation/Gait Assistance  5: Supervision    Ambulation Distance (Feet)  80 Feet    Assistive device  Rolling walker                   [facility] Adult PT Treatment/Exercise - 11/22/18 0001      Exercises   Exercises  Knee/Hip      Knee/Hip Exercises: Stretches   Gastroc Stretch  Right;Left;3 reps;30 seconds    Gastroc Stretch Limitations  supine manual stretches      Knee/Hip Exercises: Seated   Long Arc Quad  AAROM;Strengthening;Both;2 sets;10 reps    Long Arc Quad  Weight  2 lbs.      Knee/Hip Exercises: Supine   Quad Sets Limitations  Quad sets performed with [place] estim assist-see modalities. Iniitial cueing for quad activation    Short [employer]  AROM;Strengthening;Right;Left;2 sets;10 reps    Short [employer] Limitations  2 lbs., started with bodyweight but too light so added weight    Other Supine Knee/Hip Exercises  combo hooklying hip add. ball squeeze with glut set 5 sec x 15 reps      Modalities   Modalities  Electrical Stimulation      Electrical Stimulation   Electrical Stimulation Location  Bilat. quadriceps/VMO    Chartered certified accountant  Russian    Electrical Stimulation Parameters  10 sec/10 sec to tolerance x 10 min    Electrical Stimulation Goals  Strength;Neuromuscular facilitation             PT Education - 11/22/18 1258    Education Details  HEP-added calf stretch in supine with assist from spouse vs. longsitting with strap    Person(s) Educated  Patient    Methods  Explanation;Demonstration    Comprehension  Verbalized understanding;Returned demonstration       PT Short Term Goals - 11/22/18 1514      PT SHORT TERM GOAL #1   Title  Pt will be I and compliant with HEP 4 weeks    Baseline  performing but less than optimal reps/frequency, continue goal    Time  4    Period  Weeks    Status  On-going      PT SHORT TERM GOAL #2   Title  Pt will improve 5TSTS test from 1:07 to less than one minute.    Baseline  1:07 at eval, currently 28.33 sec    Time  4    Period  Weeks    Status  Achieved        PT Long Term Goals - 11/22/18 1515      PT LONG TERM GOAL #1   Title  Pt will improve 5TSTS test to less than 30 seconds. 8 weeks    Baseline  28.33 sec currently    Time  8  Period  Weeks    Status  Achieved      PT LONG TERM GOAL #2   Title  Pt will improve bilat knee strength to 5/5 MMT    Baseline  4/5    Time  8    Period  Weeks    Target Date  01/17/19      PT LONG TERM GOAL #3    Title  Pt will improve bilat knee ROM to >110 deg to improve functional mobiliy    Baseline  see objective    Time  8    Period  Weeks    Status  On-going    Target Date  01/17/19      PT LONG TERM GOAL #4   Title  Pt will be able to ambulate at least 100 ft with LRAD.    Baseline  80 ft using RW    Time  8    Period  Weeks    Status  On-going    Target Date  01/17/19      PT LONG TERM GOAL #5   Title  Pt will improve FOTO to less than 55% limited.    Baseline  69 at eval, not retested today given just 2nd outpatient PT visit    Time  8    Period  Weeks    Status  On-going    Target Date  01/17/19            Plan - 11/22/18 1307    Clinical Impression Statement  Mild knee ROM gains since eval but otherwise limited change in functional status though today only second PT visit/1st return since eval. Primary functional limitations associated with quad/LE weakness. Gastroc tightness also impacting gait so added stretches to address. Trial [place] estim assisted quad sets to assist with neuromuscular facilitation. Pt. would benefit from continued PT for further progress to address functional limitations for mobility.    Personal Factors and Comorbidities  Comorbidity 1;Comorbidity 2;Fitness    Comorbidities  48 year old right-handed male with history of OSA, tobacco use, anxiety/depression, asthma, morbid obesity 450 lbs    Examination-Activity Limitations  Bathing;Hygiene/Grooming;Squat;Stairs;Locomotion Level;Stand;Toileting;Transfers;Carry    Examination-Participation Restrictions  Laundry;Shop;Cleaning;Meal Prep;Community Activity;Driving    Stability/Clinical Decision Making  Evolving/Moderate complexity    Clinical Decision Making  Moderate    Rehab Potential  Good    PT Frequency  Other (comment)   2-3x/week   PT Duration  8 weeks    PT Treatment/Interventions  Aquatic Therapy;Cryotherapy;Electrical Stimulation;Moist Heat;Ultrasound;DME Instruction;Gait training;Stair  training;Functional mobility training;Therapeutic activities;Therapeutic exercise;Balance training;Neuromuscular re-education;Orthotic Fit/Training;Wheelchair mobility training;Manual techniques;Passive range of motion;Dry needling;Energy conservation;Taping;Joint Manipulations    PT Next Visit Plan  continue [place] estim assisted quad sets, general quad and LE strength, knee flexion ROM, standing and ambulaiton tolerance    PT Home Exercise Plan  sit to stands, LAQ, seated heel slides, standing hip abd, sidestepping at counter, mini squats at counter    Consulted and Agree with Plan of Care  Patient       Patient will benefit from skilled therapeutic intervention in order to improve the following deficits and impairments:  Abnormal gait, Decreased activity tolerance, Decreased endurance, Decreased range of motion, Decreased strength, Increased fascial restricitons, Pain, Postural dysfunction, Obesity  Visit Diagnosis: 1. Acute pain of right knee   2. Acute pain of left knee   3. Muscle weakness (generalized)   4. Difficulty in walking, not elsewhere classified        Problem List Patient Active Problem List  Diagnosis Date Noted  . Rupture of right quadriceps tendon 11/13/2018  . Post-operative pain   . Congestion of both ears   . Pruritus   . Episode of recurrent major depressive disorder (HCC)   . Labile blood glucose   . Sore throat   . Sleep disturbance   . Labile blood pressure   . Hypoalbuminemia due to protein-calorie malnutrition (HCC)   . New onset type 2 diabetes mellitus (HCC)   . Marijuana abuse   . Drug induced constipation   . Otitis media   . Acute blood loss anemia   . Pressure injury of skin 07/30/2018  . Anxiety and depression   . Super-super obese (HCC)   . Postoperative pain   . Tobacco abuse   . Postoperative anemia due to acute blood loss 07/23/2018    Class: Acute  . Rupture of left quadriceps muscle   . Quadriceps tendon rupture 07/20/2018  .  Nutritional counseling 07/03/2018  . Disorder of tendon of left biceps 12/05/2017  . Hypogonadism in male 10/25/2017  . Diabetes (HCC)   . Encounter for well adult exam with abnormal findings 03/21/2017  . Morbid obesity (HCC) 03/21/2017  . OSA (obstructive sleep apnea) 03/21/2017  . Chronic pain 03/21/2017  . Smoker 03/21/2017  . HTN (hypertension) 03/21/2017  . Fatigue 03/21/2017  . Left inguinal hernia 03/21/2017  . Rash 03/21/2017  . History of cocaine use   . Cannabis abuse   . Renal stone   . Anal abscess, ? fistula 04/05/2011  . Asthma 12/17/2009  . 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 Allergic rhinitis 09/12/2007  . ANXIETY 02/16/2007    Lazarus Gowdahristopher Zoch, PT, DPT 11/22/18 3:19 PM  [facility] [address] [place], [place], [address] Phone: [phone]   Fax:  [phone]  Name: Larry CoveRaymond S Wissink MRN: [id number] Date of Birth: [birth year]

## 2018-11-27 MED ORDER — FLUCONAZOLE 100 MG PO TABS: 100.0000 mg | ORAL_TABLET | Freq: Every day | ORAL | 0 refills | Status: DC

## 2018-11-27 MED ORDER — KETOCONAZOLE 2 % EX CREA: 1.0000 "application " | TOPICAL_CREAM | Freq: Every day | CUTANEOUS | 1 refills | Status: DC

## 2018-11-27 MED FILL — KETOCONAZOLE 2% CREAM: 2 | 30 days supply | Qty: 60 | Fill #0

## 2018-11-27 MED FILL — FLUCONAZOLE 100 MG TABLET: 100 | 7 days supply | Qty: 7 | Fill #0

## 2018-11-27 MED FILL — ALBUTEROL SULFATE HFA 108 (: 108 (90 BAS | 25 days supply | Qty: 18 | Fill #1

## 2018-11-28 ENCOUNTER — Ambulatory Visit: Payer: No Typology Code available for payment source | Admitting: Physical Therapy

## 2018-12-01 ENCOUNTER — Ambulatory Visit: Payer: No Typology Code available for payment source | Admitting: Pulmonary Disease

## 2018-12-03 NOTE — Progress Notes (Signed)
Virtual Visit via Telephone Note  I connected with Norm Parcel on 12/04/18 at 10:00 AM EDT by telephone and verified that I am speaking with the correct person using two identifiers.  Location: Patient: Home Provider: [facility] - [address], [place], [place], [place] [address]   I discussed the limitations, risks, security and privacy concerns of performing an evaluation and management service by telephone and the availability of in person appointments. I also discussed with the patient that there may be a patient responsible charge related to this service. The patient expressed understanding and agreed to proceed.  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  15-pack-year history Smoker/ Smoking History: Former smoker, 1/3 pack per day smoker, 15 pack year smoker.  Maintenance:  Symbicort 80 Pt of: Dr. Elsworth Soho   Chief complaint: CPAP follow up / Asthma  48 year old male former smoker (quit February/2020) completing a telephone visit with our office today to review CPAP compliance as well as his asthma.  Patient reporting that in February/2020 he slipped and fell at work on wet tile he was hospitalized he ruptured both of his quadriceps.  He was hospitalized for 45 days.  Patient now is at home walking with a walker and is slowly improving.  He still is unable to drive.  Patient reports that this injury has significantly affected his ability to use his CPAP.  He does still want to use his CPAP but he has not been noncompliant over the last month.  CPAP compliance report listed below:  11/03/2018-12/02/2018-CPAP compliance report-5 at a last 30  days used, 0 those days greater than 4 hours, average usage 1 hour and 58 minutes, APAP setting 12-15, AHI 4.7  Patient also reporting he has been noncompliant with using his Symbicort inhaler.  He has to use his rescue inhaler every day.  Sometimes more than that.  Patient reports that he is worsened breathing in the summer as well as in spring.  Patient not using incentive spirometer at home pulling around 3000.  Observations/Objective:  Spirometry 04/2017  ratio of 67, FEV1 of 48% and FVC of 56% suggesting moderate to severe airway obstruction assessment  05/22/2018-pulmonary function test- FVC 3.29 (56% predicted), postbronchodilator ratio 73, postbronchodilator FEV1 2.59 (56% predicted), no bronchodilator response, mid flow reversibility, DLCO 69  RAST 04/2017 neg,  IgE 46, Eos 200  05/2017-HST- AHI 21/h   01/08/2018-CPAP titration- total sleep time 163 minutes, optimal CPAP pressure 14, >>>Recommend trial of auto CPAP 12-15  Assessment and Plan:  OSA (obstructive sleep apnea) Assessment: Recent injury in February/2020 which is limiting patient's ability to use his CPAP Noncompliant on CPAP compliance report check today  Plan: Restart CPAP use, follow-up with our office in 2 to 3 months  Asthma Plan: Continue Symbicort 80 Could consider using Symbicort 80 as needed at next office visit based off patient's symptoms   Allergic rhinitis Plan: Continue Claritin Continue Flonase  Tobacco abuse Assessment: Stop smoking in February/2020 with hospitalization  Plan: Continue to not smoke   Follow Up Instructions:  Return in about 3 months (around 03/06/2019), or if symptoms worsen or fail to improve, for Follow up with Dr.  Vassie LollAlva, Follow up with Elisha HeadlandBrian Tiera Mensinger FNP-C.   I discussed the assessment and treatment plan with the patient. The patient was provided an opportunity to ask questions and all were answered. The patient agreed with the plan and demonstrated an understanding of  the instructions.   The patient was advised to call back or seek an in-person evaluation if the symptoms worsen or if the condition fails to improve as anticipated.  I provided 23 minutes of non-face-to-face time during this encounter.   Coral CeoBrian P Jerriah Ines, NP

## 2018-12-04 ENCOUNTER — Encounter: Payer: Self-pay | Admitting: Pulmonary Disease

## 2018-12-04 ENCOUNTER — Ambulatory Visit (INDEPENDENT_AMBULATORY_CARE_PROVIDER_SITE_OTHER): Payer: No Typology Code available for payment source | Admitting: Pulmonary Disease

## 2018-12-04 ENCOUNTER — Other Ambulatory Visit: Payer: Self-pay

## 2018-12-04 DIAGNOSIS — G4733 Obstructive sleep apnea (adult) (pediatric): Secondary | ICD-10-CM

## 2018-12-04 DIAGNOSIS — J4530 Mild persistent asthma, uncomplicated: Secondary | ICD-10-CM | POA: Diagnosis not present

## 2018-12-04 DIAGNOSIS — Z720 Tobacco use: Secondary | ICD-10-CM | POA: Diagnosis not present

## 2018-12-04 DIAGNOSIS — J309 Allergic rhinitis, unspecified: Secondary | ICD-10-CM

## 2018-12-04 MED ORDER — ALBUTEROL SULFATE HFA 108 (90 BASE) MCG/ACT IN AERS: 2.0000 | INHALATION_SPRAY | Freq: Four times a day (QID) | RESPIRATORY_TRACT | 11 refills | Status: DC | PRN

## 2018-12-04 MED ORDER — BUDESONIDE-FORMOTEROL FUMARATE 80-4.5 MCG/ACT IN AERO: 2.0000 | INHALATION_SPRAY | Freq: Two times a day (BID) | RESPIRATORY_TRACT | 6 refills | Status: DC

## 2018-12-04 MED FILL — SYMBICORT 80-4.5 MCG INH: 80-4.5 | 30 days supply | Qty: 10 | Fill #0

## 2018-12-04 NOTE — Assessment & Plan Note (Signed)
Assessment: Stop smoking in February/2020 with hospitalization  Plan: Continue to not smoke

## 2018-12-04 NOTE — Assessment & Plan Note (Signed)
Assessment: Recent injury in February/2020 which is limiting patient's ability to use his CPAP Noncompliant on CPAP compliance report check today  Plan: Restart CPAP use, follow-up with our office in 2 to 3 months

## 2018-12-04 NOTE — Assessment & Plan Note (Signed)
Plan: Continue Claritin Continue Flonase 

## 2018-12-04 NOTE — Addendum Note (Signed)
Addended by: Amado Coe on: 12/04/2018 10:46 AM   Modules accepted: Orders

## 2018-12-04 NOTE — Patient Instructions (Addendum)
Restart Symbicort 80 >>> 2 puffs in the morning right when you wake up, rinse out your mouth after use, 12 hours later 2 puffs, rinse after use >>> Take this daily, no matter what >>> This is not a rescue inhaler   Only use your albuterol as a rescue medication to be used if you can't catch your breath by resting or doing a relaxed purse lip breathing pattern.  - The less you use it, the better it will work when you need it. - Ok to use up to 2 puffs  every 4 hours if you must but call for immediate appointment if use goes up over your usual need - Don't leave home without it !!  (think of it like the spare tire for your car)   Continue to not smoke  Continue daily antihistamine  >>> Can start daily antihistamine such as Zyrtec, Allegra, Claritin >>>Can choose generic >>>Choose 1 >>>Start taking daily   We recommend that you continue using your CPAP daily >>>Keep up the hard work using your device >>> Goal should be wearing this for the entire night that you are sleeping, at least 4 to 6 hours  Remember:  . Do not drive or operate heavy machinery if tired or drowsy.  . Please notify the supply company and office if you are unable to use your device regularly due to missing supplies or machine being broken.  . Work on maintaining a healthy weight and following your recommended nutrition plan  . Maintain proper daily exercise and movement  . Maintaining proper use of your device can also help improve management of other chronic illnesses such as: Blood pressure, blood sugars, and weight management.   BiPAP/ CPAP Cleaning:  >>>Clean weekly, with Dawn soap, and bottle brush.  Set up to air dry.    Return in about 3 months (around 03/06/2019), or if symptoms worsen or fail to improve, for Follow up with Dr. Vassie LollAlva, Follow up with Elisha HeadlandBrian  FNP-C.    Coronavirus (COVID-19) Are you at risk?  Are you at risk for the Coronavirus (COVID-19)?  To be considered HIGH RISK for Coronavirus  (COVID-19), you have to meet the following criteria:  . Traveled to [place], [place], [place], [place] or [place]; or in the [place] to [place], [place], [place], or [place]; and have fever, cough, and shortness of breath within the last 2 weeks of travel OR . Been in close contact with a person diagnosed with COVID-19 within the last 2 weeks and have fever, cough, and shortness of breath . IF YOU DO NOT MEET THESE CRITERIA, YOU ARE CONSIDERED LOW RISK FOR COVID-19.  What to do if you are HIGH RISK for COVID-19?  Marland Kitchen. If you are having a medical emergency, call 911. . Seek medical care right away. Before you go to a doctor's office, urgent care or emergency department, call ahead and tell them about your recent travel, contact with someone diagnosed with COVID-19, and your symptoms. You should receive instructions from your physician's office regarding next steps of care.  . When you arrive at healthcare provider, tell the healthcare staff immediately you have returned from visiting [place], [place], [place], [place] or [place]; or traveled in the [place] to [place], [place], [place], or [place]; in the last two weeks or you have been in close contact with a person diagnosed with COVID-19 in the last 2 weeks.   . Tell the health care staff about your symptoms:  fever, cough and shortness of breath. . After you have been seen by a medical provider, you will be either: o Tested for (COVID-19) and discharged home on quarantine except to seek medical care if symptoms worsen, and asked to  - Stay home and avoid contact with others until you get your results (4-5 days)  - Avoid travel on public transportation if possible (such as bus, train, or airplane) or o Sent to the Emergency Department by EMS for evaluation, COVID-19 testing, and possible admission depending on your condition and test results.  What to do if you are LOW RISK for COVID-19?  Reduce your risk of any infection by  using the same precautions used for avoiding the common cold or flu:  Marland Kitchen Wash your hands often with soap and warm water for at least 20 seconds.  If soap and water are not readily available, use an alcohol-based hand sanitizer with at least 60% alcohol.  . If coughing or sneezing, cover your mouth and nose by coughing or sneezing into the elbow areas of your shirt or coat, into a tissue or into your sleeve (not your hands). . Avoid shaking hands with others and consider head nods or verbal greetings only. . Avoid touching your eyes, nose, or mouth with unwashed hands.  . Avoid close contact with people who are sick. . Avoid places or events with large numbers of people in one location, like concerts or sporting events. . Carefully consider travel plans you have or are making. . If you are planning any travel outside or inside the [place], visit the CDC's Travelers' Health webpage for the latest health notices. . If you have some symptoms but not all symptoms, continue to monitor at home and seek medical attention if your symptoms worsen. . If you are having a medical emergency, call 911.   [place] / e-Visit: [email or web address]         [facility]: [facility]: [phone]                   [facility]: [phone]           It is flu season:   >>> Best ways to protect herself from the flu: Receive the yearly flu vaccine, practice good hand hygiene washing with soap and also using hand sanitizer when available, eat a nutritious meals, get adequate rest, hydrate appropriately   Please contact the office if your symptoms worsen or you have concerns that you are not improving.   Thank you for choosing [facility] for your healthcare, and for allowing [place] to partner with you on your healthcare journey. I am thankful to be able  to provide care to you today.   Wyn Quaker FNP-C

## 2018-12-04 NOTE — Assessment & Plan Note (Signed)
Plan: Continue Symbicort 80 Could consider using Symbicort 80 as needed at next office visit based off patient's symptoms

## 2018-12-06 ENCOUNTER — Other Ambulatory Visit: Payer: Self-pay

## 2018-12-06 ENCOUNTER — Encounter: Payer: Self-pay | Admitting: Orthopaedic Surgery

## 2018-12-06 ENCOUNTER — Ambulatory Visit: Payer: No Typology Code available for payment source | Attending: Orthopaedic Surgery | Admitting: Physical Therapy

## 2018-12-06 DIAGNOSIS — R262 Difficulty in walking, not elsewhere classified: Secondary | ICD-10-CM | POA: Insufficient documentation

## 2018-12-06 DIAGNOSIS — M25561 Pain in right knee: Secondary | ICD-10-CM | POA: Diagnosis present

## 2018-12-06 DIAGNOSIS — M6281 Muscle weakness (generalized): Secondary | ICD-10-CM | POA: Insufficient documentation

## 2018-12-06 DIAGNOSIS — M25562 Pain in left knee: Secondary | ICD-10-CM | POA: Diagnosis present

## 2018-12-06 NOTE — Therapy (Addendum)
[place], [place], [address] Phone: [phone]   Fax:  [phone]  Physical Therapy Treatment/Discharge Addendum  PHYSICAL THERAPY DISCHARGE SUMMARY  Visits from Start of Care: 3 Current functional level related to goals / functional outcomes: See below   Remaining deficits: See below   Education / Equipment: HEP Plan: Patient agrees to discharge.  Patient goals were not met. Patient is being discharged due to                                                     ?????  He is being discharged due to administrative Discharge reasons.   Patient Details  Name: Larry Fowler MRN: [id number] Date of Birth: 1971-03-16 Referring Provider (PT): Leandrew Koyanagi, MD,   Encounter Date: 12/06/2018  PT End of Session - 12/06/18 1354    Visit Number  3    Number of Visits  24    Date for PT Re-Evaluation  01/17/19    Authorization Type  [facility] focus plan    PT Start Time  1145   pt 15 min late   PT Stop Time  1223    PT Time Calculation (min)  38 min    Activity Tolerance  Patient tolerated treatment well    Behavior During Therapy  [facility] for tasks assessed/performed       Past Medical History:  Diagnosis Date  . ALLERGIC RHINITIS 09/12/2007   Qualifier: Diagnosis of  By: Jenny Reichmann MD, Hunt Oris   . ANXIETY 02/16/2007   Qualifier: Diagnosis of  By: Reatha Armour, Lucy    . ASTHMA 12/17/2009   Qualifier: Diagnosis of  By: Jenny Reichmann MD, Hunt Oris   . Cannabis abuse    currently  . Chills with fever   . DEPRESSION 09/12/2007   Qualifier: Diagnosis of  By: Jenny Reichmann MD, Hunt Oris   . History of cocaine use    in his 84's  . Hyperglycemia   . Rectal fistula 2009  . Rectal pain   . Renal stone    2008    Past Surgical History:  Procedure Laterality Date  . FINGER SURGERY  2008/09?   partial amputation of 3rd finger right hand, and reattachment of right index finger - Dr Sharol Given  . knee surgury     ? which knee per Dr Noemi Chapel in  his teens  . REPAIR QUADRICEPS/HAMSTRING MUSCLES Bilateral 07/21/2018   Procedure: REPAIR BILATERAL QUADRICEPS;  Surgeon: Leandrew Koyanagi, MD;  Location: [place];  Service: Orthopedics;  Laterality: Bilateral;  . TONSILLECTOMY      There were no vitals filed for this visit.  Subjective Assessment - 12/06/18 1345    Subjective  He relays he has been trying to do the exercises at home and walk more with walker    Pertinent History  48 year old right-handed male with history of OSA, tobacco use, anxiety/depression, asthma, morbid obesity     Limitations  Lifting;Standing;Walking;House hold activities    How long can you stand comfortably?  5 min    How long can you walk comfortably?  22 ft with RW but then wiped out    Patient Stated Goals  to be able to walk without a RW    Pain Score  4     Pain Location  Knee  Pain Orientation  Right;Left    Pain Descriptors / Indicators  Aching;Tightness;Sore    Pain Type  Surgical pain    Pain Onset  More than a month ago    Pain Frequency  Intermittent                       [facility] Adult PT Treatment/Exercise - 12/06/18 0001      Transfers   Comments  wheel chair to mat table and back X 3 total with supervision      Ambulation/Gait   Ambulation/Gait  Yes    Ambulation/Gait Assistance  5: Supervision    Ambulation Distance (Feet)  90 Feet    Assistive device  Rolling walker    Gait Comments  with wheel chair follow      Knee/Hip Exercises: Stretches   Passive Hamstring Stretch  Right;Left;3 reps;30 seconds    Gastroc Stretch  Right;Left;3 reps;30 seconds    Gastroc Stretch Limitations  supine manual stretches      Knee/Hip Exercises: Standing   Knee Flexion  Left;Right;10 reps    Knee Flexion Limitations  UE support using RW    Hip Flexion  Right;Left;15 reps    Hip Flexion Limitations  UE support using RW, 5 lbs    Hip Abduction  Right;Left;10 reps;Knee straight    Abduction Limitations  UE support using RW      Knee/Hip  Exercises: Seated   Long Arc Quad  AAROM;Strengthening;Both;2 sets;10 reps    Long Arc Quad Weight  5 lbs.    Sit to [employer]  2 sets;5 reps      Knee/Hip Exercises: Supine   Short Arc Quad Sets  AROM;Strengthening;Right;Left;2 sets;10 reps    Short Arc Quad Sets Limitations  3 lbs X 20 bilat    Hip Adduction Isometric Limitations  5 sec X 20    Bridges  10 reps      Manual Therapy   Manual therapy comments  PROM for bilat knee, and passive stretching for H.S. hip IR/ER, and calves               PT Short Term Goals - 11/22/18 1514      PT SHORT TERM GOAL #1   Title  Pt will be I and compliant with HEP 4 weeks    Baseline  performing but less than optimal reps/frequency, continue goal    Time  4    Period  Weeks    Status  On-going      PT SHORT TERM GOAL #2   Title  Pt will improve 5TSTS test from 1:07 to less than one minute.    Baseline  1:07 at eval, currently 28.33 sec    Time  4    Period  Weeks    Status  Achieved        PT Long Term Goals - 11/22/18 1515      PT LONG TERM GOAL #1   Title  Pt will improve 5TSTS test to less than 30 seconds. 8 weeks    Baseline  28.33 sec currently    Time  8    Period  Weeks    Status  Achieved      PT LONG TERM GOAL #2   Title  Pt will improve bilat knee strength to 5/5 MMT    Baseline  4/5    Time  8    Period  Weeks    Target Date  01/17/19  PT LONG TERM GOAL #3   Title  Pt will improve bilat knee ROM to >110 deg to improve functional mobiliy    Baseline  see objective    Time  8    Period  Weeks    Status  On-going    Target Date  01/17/19      PT LONG TERM GOAL #4   Title  Pt will be able to ambulate at least 100 ft with LRAD.    Baseline  80 ft using RW    Time  8    Period  Weeks    Status  On-going    Target Date  01/17/19      PT LONG TERM GOAL #5   Title  Pt will improve FOTO to less than 55% limited.    Baseline  69 at eval, not retested today given just 2nd outpatient PT visit    Time  8     Period  Weeks    Status  On-going    Target Date  01/17/19            Plan - 12/06/18 1355    Clinical Impression Statement  He has not atttended PT as frequently as prescribed however he is making some progress in knee ROM, strenght, standing and walking tolerance. He still has significant weakness and endurance defecits and will continue to benefit from skilled PT.    Personal Factors and Comorbidities  Comorbidity 1;Comorbidity 2;Fitness    Comorbidities  48 year old right-handed male with history of OSA, tobacco use, anxiety/depression, asthma, morbid obesity 450 lbs    Examination-Activity Limitations  Bathing;Hygiene/Grooming;Squat;Stairs;Locomotion Level;Stand;Toileting;Transfers;Carry    Examination-Participation Restrictions  Laundry;Shop;Cleaning;Meal Prep;Community Activity;Driving    Stability/Clinical Decision Making  Evolving/Moderate complexity    Rehab Potential  Good    PT Frequency  Other (comment)   2-3x/week   PT Duration  8 weeks    PT Treatment/Interventions  Aquatic Therapy;Cryotherapy;Electrical Stimulation;Moist Heat;Ultrasound;DME Instruction;Gait training;Stair training;Functional mobility training;Therapeutic activities;Therapeutic exercise;Balance training;Neuromuscular re-education;Orthotic Fit/Training;Wheelchair mobility training;Manual techniques;Passive range of motion;Dry needling;Energy conservation;Taping;Joint Manipulations    PT Next Visit Plan  continue [place] assisted quad sets, general quad and LE strength, knee flexion ROM, standing and ambulaiton tolerance    PT Home Exercise Plan  sit to stands, LAQ, seated heel slides, standing hip abd, sidestepping at counter, mini squats at counter    Consulted and Agree with Plan of Care  Patient       Patient will benefit from skilled therapeutic intervention in order to improve the following deficits and impairments:  Abnormal gait, Decreased activity tolerance, Decreased endurance, Decreased  range of motion, Decreased strength, Increased fascial restricitons, Pain, Postural dysfunction, Obesity  Visit Diagnosis: 1. Acute pain of right knee   2. Acute pain of left knee   3. Muscle weakness (generalized)   4. Difficulty in walking, not elsewhere classified        Problem List Patient Active Problem List   Diagnosis Date Noted  . Rupture of right quadriceps tendon 11/13/2018  . Post-operative pain   . Congestion of both ears   . Pruritus   . Episode of recurrent major depressive disorder ([place])   . Labile blood glucose   . Sore throat   . Sleep disturbance   . Labile blood pressure   . Hypoalbuminemia due to protein-calorie malnutrition ([place])   . New onset type 2 diabetes mellitus ([place])   . Marijuana abuse   . Drug induced constipation   . Otitis media   .  Acute blood loss anemia   . Pressure injury of skin 07/30/2018  . Anxiety and depression   . Super-super obese ([place])   . Postoperative pain   . Tobacco abuse   . Postoperative anemia due to acute blood loss 07/23/2018    Class: Acute  . Rupture of left quadriceps muscle   . Quadriceps tendon rupture 07/20/2018  . Nutritional counseling 07/03/2018  . Disorder of tendon of left biceps 12/05/2017  . Hypogonadism in male 10/25/2017  . Diabetes ([place])   . Encounter for well adult exam with abnormal findings 03/21/2017  . Morbid obesity ([place]) 03/21/2017  . OSA (obstructive sleep apnea) 03/21/2017  . Chronic pain 03/21/2017  . Smoker 03/21/2017  . HTN (hypertension) 03/21/2017  . Fatigue 03/21/2017  . Left inguinal hernia 03/21/2017  . Rash 03/21/2017  . History of cocaine use   . Cannabis abuse   . Renal stone   . Anal abscess, ? fistula 04/05/2011  . Asthma 12/17/2009  . 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 Allergic rhinitis 09/12/2007  . ANXIETY 02/16/2007    Silvestre Mesi 12/06/2018, 1:58 PM  [facility] [address] [place], [place], [address] Phone: [phone]   Fax:  [phone]  Name: Larry Fowler MRN: [id number] Date of Birth: [birth year]

## 2018-12-07 NOTE — Telephone Encounter (Signed)
What do you think??

## 2018-12-10 NOTE — Telephone Encounter (Signed)
sure

## 2018-12-12 ENCOUNTER — Ambulatory Visit (INDEPENDENT_AMBULATORY_CARE_PROVIDER_SITE_OTHER): Payer: No Typology Code available for payment source | Admitting: Orthopaedic Surgery

## 2018-12-12 ENCOUNTER — Other Ambulatory Visit: Payer: Self-pay

## 2018-12-12 ENCOUNTER — Encounter: Payer: Self-pay | Admitting: Orthopaedic Surgery

## 2018-12-12 DIAGNOSIS — S76111D Strain of right quadriceps muscle, fascia and tendon, subsequent encounter: Secondary | ICD-10-CM

## 2018-12-12 DIAGNOSIS — S76112D Strain of left quadriceps muscle, fascia and tendon, subsequent encounter: Secondary | ICD-10-CM

## 2018-12-12 NOTE — Progress Notes (Signed)
Office Visit Note   Patient: Larry Fowler           Date of Birth: 01/13/1971           MRN: [id number] Visit Date: 12/12/2018              Requested by: [name], MD [address] [place],  [place] [address] PCP: [name], MD   Assessment & Plan: Visit Diagnoses:  1. Rupture of right quadriceps tendon, subsequent encounter   2. Rupture of left quadriceps muscle, subsequent encounter     Plan: Impression is 58-month status post bilateral quadriceps repair.  Unfortunately this injury will cause some permanent weakness and dysfunction especially since it is bilateral and that he is morbidly obese.  I anticipate that it MMI will be 1 to 2 years.  Due to the fact that he is having trouble driving and does not feel safe doing so he does not require the assistance of his wife to take him to and from the PT appointments.  He will need this assistance from Nov 04, 2018 to upwards of a year from that date.  We will make a referral to outpatient PT at another office.  We will see him back as needed.  Follow-Up Instructions: Return if symptoms worsen or fail to improve.   Orders:  No orders of the defined types were placed in this encounter.  No orders of the defined types were placed in this encounter.     Procedures: No procedures performed   Clinical Data: No additional findings.   Subjective: Chief Complaint  Patient presents with  . Follow-up    Larry Fowler his 5 months status post repair of bilateral quadriceps rupture.  He is progressing through outpatient PT but was recently discharged from the [facility] due to inappropriate comments.  He has had trouble with driving and does not feel safe doing so.   Review of Systems  Constitutional: Negative.   All other systems reviewed and are negative.    Objective: Vital Signs: There were no vitals taken for this visit.  Physical Exam Vitals signs and nursing note reviewed.  Constitutional:      Appearance:  He is well-developed.  Pulmonary:     Effort: Pulmonary effort is normal.  Abdominal:     Palpations: Abdomen is soft.  Skin:    General: Skin is warm.  Neurological:     Mental Status: He is alert and oriented to person, place, and time.  Psychiatric:        Behavior: Behavior normal.        Thought Content: Thought content normal.        Judgment: Judgment normal.     Ortho Exam Bilateral lower extremity exams are relatively benign.  He has a large leg overall.  Surgical scar is fully healed.  Excellent range of motion.  Good quadricep strength against gravity and resistance. Specialty Comments:  No specialty comments available.  Imaging: No results found.   PMFS History: Patient Active Problem List   Diagnosis Date Noted  . Rupture of right quadriceps tendon 11/13/2018  . Post-operative pain   . Congestion of both ears   . Pruritus   . Episode of recurrent major depressive disorder ([place])   . Labile blood glucose   . Sore throat   . Sleep disturbance   . Labile blood pressure   . Hypoalbuminemia due to protein-calorie malnutrition ([place])   . New onset type  2 diabetes mellitus ([facility])   . Marijuana abuse   . Drug induced constipation   . Otitis media   . Acute blood loss anemia   . Pressure injury of skin 07/30/2018  . Anxiety and depression   . Super-super obese ([facility])   . Postoperative pain   . Tobacco abuse   . Postoperative anemia due to acute blood loss 07/23/2018    Class: Acute  . Rupture of left quadriceps muscle   . Quadriceps tendon rupture 07/20/2018  . Nutritional counseling 07/03/2018  . Disorder of tendon of left biceps 12/05/2017  . Hypogonadism in male 10/25/2017  . Diabetes ([facility])   . Encounter for well adult exam with abnormal findings 03/21/2017  . Morbid obesity ([facility]) 03/21/2017  . OSA (obstructive sleep apnea) 03/21/2017  . Chronic pain 03/21/2017  . Smoker 03/21/2017  . HTN (hypertension) 03/21/2017  . Fatigue 03/21/2017  . Left  inguinal hernia 03/21/2017  . Rash 03/21/2017  . History of cocaine use   . Cannabis abuse   . Renal stone   . Anal abscess, ? fistula 04/05/2011  . Asthma 12/17/2009  . 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 Allergic rhinitis 09/12/2007  . ANXIETY 02/16/2007   Past Medical History:  Diagnosis Date  . ALLERGIC RHINITIS 09/12/2007   Qualifier: Diagnosis of  By: Jonny RuizJohn MD, Len BlalockJames W   . ANXIETY 02/16/2007   Qualifier: Diagnosis of  By: Tyrone AppleBrand RMA, Lucy    . ASTHMA 12/17/2009   Qualifier: Diagnosis of  By: Jonny RuizJohn MD, Len BlalockJames W   . Cannabis abuse    currently  . Chills with fever   . DEPRESSION 09/12/2007   Qualifier: Diagnosis of  By: Jonny RuizJohn MD, Len BlalockJames W   . History of cocaine use    in his 2520's  . Hyperglycemia   . Rectal fistula 2009  . Rectal pain   . Renal stone    2008    Family History  Problem Relation Age of Onset  . Diabetes type II Mother   . Pneumonia Father     Past Surgical History:  Procedure Laterality Date  . FINGER SURGERY  2008/09?   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 knee surgury     ? which knee per Dr Thurston HoleWainer in his teens  . REPAIR QUADRICEPS/HAMSTRING MUSCLES Bilateral 07/21/2018   Procedure: REPAIR BILATERAL QUADRICEPS;  Surgeon: Tarry KosXu,  M, MD;  Location: [facility];  Service: Orthopedics;  Laterality: Bilateral;  . TONSILLECTOMY     Social History   Occupational History  . Not on file  Tobacco Use  . Smoking status: Former Smoker    Packs/day: 0.25    Quit date: 03/01/2011    Years since quitting: 7.7  . Smokeless tobacco: Never Used  . Tobacco comment: doesn't smoke the whole cig, burns out while playing video game  Substance and Sexual Activity  . Alcohol use: Yes  . Drug use: Yes    Types: Marijuana  . Sexual activity: Not on file

## 2018-12-14 ENCOUNTER — Telehealth: Payer: Self-pay | Admitting: Emergency Medicine

## 2018-12-14 ENCOUNTER — Encounter: Payer: Self-pay | Admitting: Internal Medicine

## 2018-12-14 DIAGNOSIS — J329 Chronic sinusitis, unspecified: Secondary | ICD-10-CM

## 2018-12-14 DIAGNOSIS — Z20822 Contact with and (suspected) exposure to covid-19: Secondary | ICD-10-CM

## 2018-12-14 NOTE — Telephone Encounter (Signed)
No , this is not likely due to a medication side effect  OK to refer for covid testing if he consents to this

## 2018-12-14 NOTE — Telephone Encounter (Signed)
Pt is c/o Loss of taste and smell. Pt would like to know if this could be a side effect from one of his medications. Pt denies any other symptoms related to the flu or Covid. States this started when he was released from the hospital in Feb.

## 2018-12-15 NOTE — Addendum Note (Signed)
Addended by: Denyce Robert on: 12/15/2018 08:23 AM   Modules accepted: Orders

## 2018-12-15 NOTE — Telephone Encounter (Signed)
Please contact pt for covid testing. Thanks! 

## 2018-12-15 NOTE — Telephone Encounter (Signed)
Pt. returned call and asked questions about the process of having COVID testing done.  Stated "I'm just not comfortable with having something put in my face."  Tried to reassure pt. with explaining the process of obtaining specimen, by inserting a nasal swab, mid-way up in his nostrils, and twirling the swab.  Stated he has to think about it, and will call back if he decides to go through with it.

## 2018-12-15 NOTE — Telephone Encounter (Signed)
Left voicemail for patient to return call to schedule COVID 19 test Monday - Friday, 7a-7p.  Order placed.

## 2018-12-26 ENCOUNTER — Other Ambulatory Visit: Payer: Self-pay

## 2018-12-26 ENCOUNTER — Other Ambulatory Visit (INDEPENDENT_AMBULATORY_CARE_PROVIDER_SITE_OTHER): Payer: No Typology Code available for payment source

## 2018-12-26 ENCOUNTER — Ambulatory Visit (INDEPENDENT_AMBULATORY_CARE_PROVIDER_SITE_OTHER): Payer: No Typology Code available for payment source | Admitting: Internal Medicine

## 2018-12-26 ENCOUNTER — Encounter: Payer: Self-pay | Admitting: Internal Medicine

## 2018-12-26 VITALS — BP 144/88 | HR 95 | Temp 98.2°F | Ht 75.0 in

## 2018-12-26 DIAGNOSIS — E119 Type 2 diabetes mellitus without complications: Secondary | ICD-10-CM | POA: Diagnosis not present

## 2018-12-26 DIAGNOSIS — E559 Vitamin D deficiency, unspecified: Secondary | ICD-10-CM

## 2018-12-26 DIAGNOSIS — E611 Iron deficiency: Secondary | ICD-10-CM

## 2018-12-26 DIAGNOSIS — E538 Deficiency of other specified B group vitamins: Secondary | ICD-10-CM | POA: Diagnosis not present

## 2018-12-26 DIAGNOSIS — Z0000 Encounter for general adult medical examination without abnormal findings: Secondary | ICD-10-CM

## 2018-12-26 LAB — CBC WITH DIFFERENTIAL/PLATELET
Basophils Absolute: 0.1 10*3/uL (ref 0.0–0.1)
Basophils Relative: 1.2 % (ref 0.0–3.0)
Eosinophils Absolute: 0.1 10*3/uL (ref 0.0–0.7)
Eosinophils Relative: 1.2 % (ref 0.0–5.0)
HCT: 39.4 % (ref 39.0–52.0)
Hemoglobin: 12.8 g/dL — ABNORMAL LOW (ref 13.0–17.0)
Lymphocytes Relative: 26.5 % (ref 12.0–46.0)
Lymphs Abs: 2.8 10*3/uL (ref 0.7–4.0)
MCHC: 32.3 g/dL (ref 30.0–36.0)
MCV: 89.4 fl (ref 78.0–100.0)
Monocytes Absolute: 0.7 10*3/uL (ref 0.1–1.0)
Monocytes Relative: 6.8 % (ref 3.0–12.0)
Neutro Abs: 6.8 10*3/uL (ref 1.4–7.7)
Neutrophils Relative %: 64.3 % (ref 43.0–77.0)
Platelets: 195 10*3/uL (ref 150.0–400.0)
RBC: 4.41 Mil/uL (ref 4.22–5.81)
RDW: 17.5 % — ABNORMAL HIGH (ref 11.5–15.5)
WBC: 10.6 10*3/uL — ABNORMAL HIGH (ref 4.0–10.5)

## 2018-12-26 LAB — IBC PANEL
Iron: 37 ug/dL — ABNORMAL LOW (ref 42–165)
Saturation Ratios: 10.7 % — ABNORMAL LOW (ref 20.0–50.0)
Transferrin: 246 mg/dL (ref 212.0–360.0)

## 2018-12-26 LAB — TSH: TSH: 2.13 u[IU]/mL (ref 0.35–4.50)

## 2018-12-26 LAB — LIPID PANEL
Cholesterol: 182 mg/dL (ref 0–200)
HDL: 51.8 mg/dL (ref >39.00)
LDL Cholesterol: 109 mg/dL — ABNORMAL HIGH (ref 0–99)
NonHDL: 130.62
Total CHOL/HDL Ratio: 4
Triglycerides: 109 mg/dL (ref 0.0–149.0)
VLDL: 21.8 mg/dL (ref 0.0–40.0)

## 2018-12-26 LAB — BASIC METABOLIC PANEL
BUN: 15 mg/dL (ref 6–23)
CO2: 25 mEq/L (ref 19–32)
Calcium: 9 mg/dL (ref 8.4–10.5)
Chloride: 101 mEq/L (ref 96–112)
Creatinine, Ser: 0.58 mg/dL (ref 0.40–1.50)
GFR: 150.33 mL/min (ref >60.00)
Glucose, Bld: 241 mg/dL — ABNORMAL HIGH (ref 70–99)
Potassium: 4.1 mEq/L (ref 3.5–5.1)
Sodium: 137 mEq/L (ref 135–145)

## 2018-12-26 LAB — HEPATIC FUNCTION PANEL
ALT: 29 U/L (ref 0–53)
AST: 32 U/L (ref 0–37)
Albumin: 4.1 g/dL (ref 3.5–5.2)
Alkaline Phosphatase: 88 U/L (ref 39–117)
Bilirubin, Direct: 0.1 mg/dL (ref 0.0–0.3)
Total Bilirubin: 0.3 mg/dL (ref 0.2–1.2)
Total Protein: 6.3 g/dL (ref 6.0–8.3)

## 2018-12-26 LAB — URINALYSIS, ROUTINE W REFLEX MICROSCOPIC
Bilirubin Urine: NEGATIVE
Hgb urine dipstick: NEGATIVE
Ketones, ur: NEGATIVE
Leukocytes,Ua: NEGATIVE
Nitrite: NEGATIVE
Specific Gravity, Urine: >=1.03 — AB (ref 1.000–1.030)
Total Protein, Urine: NEGATIVE
Urine Glucose: 100 — AB
Urobilinogen, UA: 0.2 (ref 0.0–1.0)
pH: 5.5 (ref 5.0–8.0)

## 2018-12-26 LAB — MICROALBUMIN / CREATININE URINE RATIO
Creatinine,U: 213.7 mg/dL
Microalb Creat Ratio: 0.8 mg/g (ref 0.0–30.0)
Microalb, Ur: 1.6 mg/dL (ref 0.0–1.9)

## 2018-12-26 LAB — VITAMIN D 25 HYDROXY (VIT D DEFICIENCY, FRACTURES): VITD: 12.63 ng/mL — ABNORMAL LOW (ref 30.00–100.00)

## 2018-12-26 LAB — VITAMIN B12: Vitamin B-12: 584 pg/mL (ref 211–911)

## 2018-12-26 LAB — HEMOGLOBIN A1C: Hgb A1c MFr Bld: 8.8 % — ABNORMAL HIGH (ref 4.6–6.5)

## 2018-12-26 LAB — PSA: PSA: 0.08 ng/mL — ABNORMAL LOW (ref 0.10–4.00)

## 2018-12-26 MED ORDER — OXYCODONE HCL 5 MG PO TABS: 5.0000 mg | ORAL_TABLET | Freq: Two times a day (BID) | ORAL | 0 refills | Status: DC | PRN

## 2018-12-26 MED FILL — oxyCODONE HCL 5 MG TABS: 5 | 30 days supply | Qty: 60 | Fill #0

## 2018-12-26 NOTE — Progress Notes (Signed)
Subjective:    Patient ID: Larry Fowler, male    DOB: [birth year], 48 y.o.   MRN: [id number]  HPI  Here for wellness and f/u;  Overall doing ok;  Pt denies Chest pain, worsening SOB, DOE, wheezing, orthopnea, PND, worsening LE edema, palpitations, dizziness or syncope.  Pt denies neurological change such as new headache, facial or extremity weakness.  Pt denies polydipsia, polyuria, or low sugar symptoms. Pt states overall good compliance with treatment and medications, good tolerability, and has been trying to follow appropriate diet.  Pt denies worsening depressive symptoms, suicidal ideation or panic. No fever, night sweats, wt loss, loss of appetite, or other constitutional symptoms.  Pt states good ability with ADL's, has low fall risk, home safety reviewed and adequate, no other significant changes in hearing or vision, and only occasionally active with exercise.  No new complaints.   Past Medical History:  Diagnosis Date  . ALLERGIC RHINITIS 09/12/2007   Qualifier: Diagnosis of  By: Jenny Reichmann MD, Hunt Oris   . ANXIETY 02/16/2007   Qualifier: Diagnosis of  By: Reatha Armour, Lucy    . ASTHMA 12/17/2009   Qualifier: Diagnosis of  By: Jenny Reichmann MD, Hunt Oris   . Cannabis abuse    currently  . Chills with fever   . DEPRESSION 09/12/2007   Qualifier: Diagnosis of  By: Jenny Reichmann MD, Hunt Oris   . History of cocaine use    in his 24's  . Hyperglycemia   . Rectal fistula 2009  . Rectal pain   . Renal stone    2008   Past Surgical History:  Procedure Laterality Date  . FINGER SURGERY  2008/09?   partial amputation of 3rd finger right hand, and reattachment of right index finger - Dr Sharol Given  . knee surgury     ? which knee per Dr Noemi Chapel in his teens  . REPAIR QUADRICEPS/HAMSTRING MUSCLES Bilateral 07/21/2018   Procedure: REPAIR BILATERAL QUADRICEPS;  Surgeon: Leandrew Koyanagi, MD;  Location: [place];  Service: Orthopedics;  Laterality: Bilateral;  . TONSILLECTOMY      reports that he quit smoking about 7 years ago.  He smoked 0.25 packs per day. He has never used smokeless tobacco. He reports current alcohol use. He reports current drug use. Drug: Marijuana. family history includes Diabetes type II in his mother; Pneumonia in his father. No Known Allergies Current Outpatient Medications on File Prior to Visit  Medication Sig Dispense Refill  . albuterol (VENTOLIN HFA) 108 (90 Base) MCG/ACT inhaler Inhale 2 puffs into the lungs every 6 (six) hours as needed for wheezing. 8 g 11  . Blood Glucose Monitoring Suppl (ONE TOUCH ULTRA 2) w/Device KIT Use as directed once daily E11.9 1 each 0  . budesonide-formoterol (SYMBICORT) 80-4.5 MCG/ACT inhaler Inhale 2 puffs into the lungs 2 (two) times daily. 1 Inhaler 6  . glucose blood (ONE TOUCH ULTRA TEST) test strip Use as instructed once daily E11.9 100 each 12  . ketoconazole (NIZORAL) 2 % cream Apply 1 application topically daily. 60 g 1  . Lancets MISC Use as directed once daily E11.9 100 each 12  . loratadine (CLARITIN) 10 MG tablet Take 1 tablet (10 mg total) by mouth daily. 30 tablet 0  . losartan (COZAAR) 25 MG tablet Take 1 tablet (25 mg total) by mouth daily. 30 tablet 0  . metFORMIN (GLUCOPHAGE) 850 MG tablet Take 1 tablet (850 mg total) by mouth daily with breakfast. 90 tablet 3  . methocarbamol (ROBAXIN) 750  MG tablet Take 1 tablet (750 mg total) by mouth every 8 (eight) hours as needed for muscle spasms. (Patient taking differently: Take 750 mg by mouth every 8 (eight) hours as needed for muscle spasms. Awaiting mail order delivery of this medication) 60 tablet 0  . Multiple Vitamin (MULTIVITAMIN WITH MINERALS) TABS tablet Take 1 tablet by mouth daily.    . naproxen (NAPROSYN) 500 MG tablet Take 1 tablet (500 mg total) by mouth 2 (two) times daily with a meal. Take 500 mg by mouth 2 (two) times daily with a meal as needed 90 tablet 1  . oxyCODONE (OXY IR/ROXICODONE) 5 MG immediate release tablet Take 1 tablet (5 mg total) by mouth 2 (two) times daily as  needed for severe pain. 60 tablet 0  . pantoprazole (PROTONIX) 40 MG tablet Take 1 tablet (40 mg total) by mouth daily. 90 tablet 3  . traZODone (DESYREL) 50 MG tablet Take 1 tablet (50 mg total) by mouth at bedtime. 10 tablet 0   No current facility-administered medications on file prior to visit.    Review of Systems Constitutional: Negative for other unusual diaphoresis, sweats, appetite or weight changes HENT: Negative for other worsening hearing loss, ear pain, facial swelling, mouth sores or neck stiffness.   Eyes: Negative for other worsening pain, redness or other visual disturbance.  Respiratory: Negative for other stridor or swelling Cardiovascular: Negative for other palpitations or other chest pain  Gastrointestinal: Negative for worsening diarrhea or loose stools, blood in stool, distention or other pain Genitourinary: Negative for hematuria, flank pain or other change in urine volume.  Musculoskeletal: Negative for myalgias or other joint swelling.  Skin: Negative for other color change, or other wound or worsening drainage.  Neurological: Negative for other syncope or numbness. Hematological: Negative for other adenopathy or swelling Psychiatric/Behavioral: Negative for hallucinations, other worsening agitation, SI, self-injury, or new decreased concentration All other system neg per pt    Objective:   Physical Exam BP (!) 144/88   Pulse 95   Temp 98.2 F (36.8 C) (Oral)   Ht _0  (1.905 m)   SpO2 96%   BMI 60.87 kg/m  VS noted, super morbid obese Constitutional: Pt is oriented to person, place, and time. Appears well-developed and well-nourished, in no significant distress and comfortable Head: Normocephalic and atraumatic  Eyes: Conjunctivae and EOM are normal. Pupils are equal, round, and reactive to light Right Ear: External ear normal without discharge Left Ear: External ear normal without discharge Nose: Nose without discharge or deformity Mouth/Throat:  Oropharynx is without other ulcerations and moist  Neck: Normal range of motion. Neck supple. No JVD present. No tracheal deviation present or significant neck LA or mass Cardiovascular: Normal rate, regular rhythm, normal heart sounds and intact distal pulses.   Pulmonary/Chest: WOB normal and breath sounds without rales or wheezing  Abdominal: Soft. Bowel sounds are normal. NT. No HSM  Musculoskeletal: Normal range of motion. Exhibits no edema Lymphadenopathy: Has no other cervical adenopathy.  Neurological: Pt is alert and oriented to person, place, and time. Pt has normal reflexes. No cranial nerve deficit. Motor grossly intact, Gait intact Skin: Skin is warm and dry. No rash noted or new ulcerations Psychiatric:  Has normal mood and affect. Behavior is normal without agitation No other exam findings  Lab Results  Component Value Date   WBC 7.4 08/29/2018   HGB 10.5 (L) 08/29/2018   HCT 34.3 (L) 08/29/2018   PLT 165 08/29/2018   GLUCOSE 140 (H) 08/28/2018  CHOL 150 07/26/2018   TRIG 107 07/26/2018   HDL 38 (L) 07/26/2018   LDLCALC 91 07/26/2018   ALT 26 08/02/2018   AST 29 08/02/2018   NA 138 08/28/2018   K 4.4 08/28/2018   CL 101 08/28/2018   CREATININE 0.71 08/28/2018   BUN 13 08/28/2018   CO2 27 08/28/2018   TSH 1.88 03/21/2017   PSA 0.16 03/21/2017   HGBA1C 7.1 (H) 07/24/2018        Assessment & Plan:

## 2018-12-26 NOTE — Patient Instructions (Signed)
Please continue all other medications as before, and refills have been done if requested - the hydrocodone  Please have the pharmacy call with any other refills you may need.  Please continue your efforts at being more active, low cholesterol diet, and weight control.  You are otherwise up to date with prevention measures today.  Please keep your appointments with your specialists as you may have planned  Please go to the LAB in the Basement (turn left off the elevator) for the tests to be done today  You will be contacted by phone if any changes need to be made immediately.  Otherwise, you will receive a letter about your results with an explanation, but please check with MyChart first.  Please remember to sign up for MyChart if you have not done so, as this will be important to you in the future with finding out test results, communicating by private email, and scheduling acute appointments online when needed.  Please return in 6 months, or sooner if needed, with Lab testing done 3-5 days before  

## 2018-12-27 ENCOUNTER — Telehealth: Payer: Self-pay

## 2018-12-27 ENCOUNTER — Encounter: Payer: Self-pay | Admitting: Internal Medicine

## 2018-12-27 ENCOUNTER — Other Ambulatory Visit: Payer: Self-pay | Admitting: Internal Medicine

## 2018-12-27 DIAGNOSIS — E611 Iron deficiency: Secondary | ICD-10-CM

## 2018-12-27 MED ORDER — VITAMIN D (ERGOCALCIFEROL) 1.25 MG (50000 UNIT) PO CAPS: 50000.0000 [IU] | ORAL_CAPSULE | ORAL | 0 refills | Status: DC

## 2018-12-27 MED ORDER — POLYSACCHARIDE IRON COMPLEX 150 MG PO CAPS: 150.0000 mg | ORAL_CAPSULE | Freq: Every day | ORAL | 1 refills | Status: DC

## 2018-12-27 MED ORDER — METFORMIN HCL 1000 MG PO TABS: 1000.0000 mg | ORAL_TABLET | Freq: Two times a day (BID) | ORAL | 3 refills | Status: DC

## 2018-12-27 MED FILL — POLY-IRON 150 MG CAPSULE: 150 | 90 days supply | Qty: 90 | Fill #0

## 2018-12-27 MED FILL — VIT D2 1.25 MG (50,000 UNIT: 1.25 MG | 84 days supply | Qty: 12 | Fill #0

## 2018-12-27 NOTE — Telephone Encounter (Signed)
-----   Message from [name], MD sent at 12/27/2018 12:28 PM EDT ----- Left message on MyChart, pt to cont same tx except  The test results show that your current treatment is OK, except the Iron level is low, the Vitamin D level is low, and the A1c is moderately higher.    We need to: 1)  Start Nu-iron 1 per day 2)  Refer to Gastroenterology to check for any "slow leak" of bleeding internally for some reason 3)  Please take Vitamin D 50000 units weekly for 12 weeks, then plan to change to OTC Vitamin D3 at 2000 units per day, indefinitely. 4)  Increase the metformin to 1000 mg twice per day (I will send the script)    Shirron to please inform pt, I will do referral and rx x 3

## 2018-12-27 NOTE — Telephone Encounter (Signed)
Pt has viewed results via MyChart  

## 2018-12-28 MED ORDER — GLIPIZIDE ER 10 MG PO TB24: 10.0000 mg | ORAL_TABLET | Freq: Every day | ORAL | 3 refills | Status: DC

## 2018-12-28 MED FILL — SYMBICORT 80-4.5 MCG INH: 80-4.5 | 30 days supply | Qty: 10 | Fill #1

## 2018-12-28 MED FILL — glipiZIDE ER 10 MG TB24: 10 | 90 days supply | Qty: 90 | Fill #0

## 2018-12-29 ENCOUNTER — Encounter: Payer: Self-pay | Admitting: Internal Medicine

## 2018-12-29 MED ORDER — HYDROCORTISONE ACETATE 25 MG RE SUPP: 25.0000 mg | Freq: Two times a day (BID) | RECTAL | 1 refills | Status: DC

## 2018-12-29 MED FILL — HYDROCORTISONE ACETATE 25 M: 25 | 6 days supply | Qty: 12 | Fill #0

## 2018-12-30 ENCOUNTER — Encounter: Payer: Self-pay | Admitting: Internal Medicine

## 2018-12-30 NOTE — Assessment & Plan Note (Signed)
stable overall by history and exam, recent data reviewed with pt, and pt to continue medical treatment as before,  to f/u any worsening symptoms or concerns  

## 2018-12-30 NOTE — Assessment & Plan Note (Signed)

## 2019-01-01 ENCOUNTER — Telehealth: Payer: Self-pay | Admitting: Orthopaedic Surgery

## 2019-01-01 ENCOUNTER — Encounter: Payer: Self-pay | Admitting: Internal Medicine

## 2019-01-01 NOTE — Telephone Encounter (Signed)
Faxed completed [employer] forms (signed 7/17) [phone]

## 2019-01-09 ENCOUNTER — Encounter: Payer: Self-pay | Admitting: Internal Medicine

## 2019-01-16 MED FILL — ALBUTEROL SULFATE HFA 108 (: 108 (90 BAS | 25 days supply | Qty: 18 | Fill #2

## 2019-01-18 ENCOUNTER — Encounter: Payer: Self-pay | Admitting: Internal Medicine

## 2019-01-18 MED ORDER — KETOCONAZOLE 2 % EX CREA: 1.0000 "application " | TOPICAL_CREAM | Freq: Every day | CUTANEOUS | 1 refills | Status: DC | PRN

## 2019-01-18 MED FILL — KETOCONAZOLE 2% CREAM: 2 | 30 days supply | Qty: 60 | Fill #0

## 2019-01-29 ENCOUNTER — Ambulatory Visit: Payer: No Typology Code available for payment source | Admitting: Internal Medicine

## 2019-02-05 ENCOUNTER — Encounter: Payer: Self-pay | Admitting: Internal Medicine

## 2019-02-05 MED FILL — PANTOPRAZOLE SOD DR 40 MG T: 40 | 90 days supply | Qty: 90 | Fill #0

## 2019-02-05 MED FILL — NAPROXEN 500 MG TABLET: 500 | 45 days supply | Qty: 90 | Fill #0

## 2019-02-05 MED FILL — ALBUTEROL SULFATE HFA 108 (: 108 (90 BAS | 25 days supply | Qty: 18 | Fill #3

## 2019-02-05 MED FILL — SYMBICORT 80-4.5 MCG INH: 80-4.5 | 30 days supply | Qty: 10 | Fill #2

## 2019-02-06 MED ORDER — LOSARTAN POTASSIUM 25 MG PO TABS: 25.0000 mg | ORAL_TABLET | Freq: Every day | ORAL | 2 refills | Status: DC

## 2019-02-06 MED FILL — LOSARTAN POTASSIUM 25 MG TA: 25 | 30 days supply | Qty: 30 | Fill #0

## 2019-02-16 ENCOUNTER — Telehealth: Payer: Self-pay | Admitting: Orthopaedic Surgery

## 2019-02-16 NOTE — Telephone Encounter (Signed)
Patient called. He would like a PT referral sent to [facility].   His call back number is [phone]

## 2019-02-19 ENCOUNTER — Ambulatory Visit: Payer: No Typology Code available for payment source | Admitting: Physical Medicine & Rehabilitation

## 2019-02-19 NOTE — Telephone Encounter (Signed)
IC s/w patient advised faxed

## 2019-02-19 NOTE — Telephone Encounter (Signed)
Ok to do.  Will you write instructions as on previous referral?

## 2019-02-19 NOTE — Telephone Encounter (Signed)
Erwin for this? If so what do you want on referral?

## 2019-02-20 ENCOUNTER — Encounter
Payer: No Typology Code available for payment source | Attending: Registered Nurse | Admitting: Physical Medicine & Rehabilitation

## 2019-02-20 ENCOUNTER — Telehealth: Payer: Self-pay

## 2019-02-20 NOTE — Telephone Encounter (Signed)
Ptn over slept and cannot make appt - advised we are no longer doing tele health.  He said wife is not home and he cannot get here in time.  He questioned why he needs to return for follow up --- can his primary care take over - please advise and I will call him back

## 2019-02-20 NOTE — Telephone Encounter (Signed)
Yes, if is PCP is going to follow up on therapy/therapy recs, then he can follow up with PCP for all care.  Thanks.

## 2019-02-21 ENCOUNTER — Telehealth: Payer: Self-pay | Admitting: Orthopaedic Surgery

## 2019-02-21 NOTE — Telephone Encounter (Signed)
Please advise. Thanks.  

## 2019-02-21 NOTE — Telephone Encounter (Signed)
Could still be swelling.  I would use ice

## 2019-02-21 NOTE — Telephone Encounter (Signed)
Patient called. Would like to know if heat and ice makes his knee go down, does that mean it is not fluid he has on his knee. His call back number is [phone]. Thanks

## 2019-02-21 NOTE — Telephone Encounter (Signed)
S/w patient and advised

## 2019-02-23 ENCOUNTER — Ambulatory Visit: Payer: No Typology Code available for payment source | Admitting: Orthopaedic Surgery

## 2019-03-06 ENCOUNTER — Ambulatory Visit: Payer: No Typology Code available for payment source | Admitting: Internal Medicine

## 2019-03-13 ENCOUNTER — Encounter: Payer: Self-pay | Admitting: Internal Medicine

## 2019-03-13 MED ORDER — CLOTRIMAZOLE-BETAMETHASONE 1-0.05 % EX CREA: 1.0000 "application " | TOPICAL_CREAM | Freq: Two times a day (BID) | CUTANEOUS | 2 refills | Status: DC

## 2019-03-13 MED FILL — CLOTRIMAZOLE-BETAMETHASONE: 1-0.05 | 10 days supply | Qty: 30 | Fill #0

## 2019-03-31 ENCOUNTER — Encounter (INDEPENDENT_AMBULATORY_CARE_PROVIDER_SITE_OTHER): Payer: Self-pay

## 2019-04-18 ENCOUNTER — Encounter: Payer: Self-pay | Admitting: Internal Medicine

## 2019-04-18 MED ORDER — GERHARDT'S BUTT CREAM: 1.0000 "application " | TOPICAL_CREAM | Freq: Two times a day (BID) | CUTANEOUS | 5 refills | Status: DC | PRN

## 2019-04-18 MED FILL — GERHARDT'S BUTT CREAM: 100000 | 20 days supply | Qty: 60 | Fill #0

## 2019-04-18 MED FILL — ALBUTEROL SULFATE HFA 108 (: 108 (90 BAS | 25 days supply | Qty: 18 | Fill #5

## 2019-04-18 MED FILL — glipiZIDE ER 10 MG TB24: 10 | 90 days supply | Qty: 90 | Fill #1

## 2019-04-18 MED FILL — LOSARTAN POTASSIUM 25 MG TA: 25 | 30 days supply | Qty: 30 | Fill #2

## 2019-04-18 MED FILL — POLY-IRON 150 MG CAPSULE: 150 | 90 days supply | Qty: 90 | Fill #1

## 2019-04-18 NOTE — Telephone Encounter (Signed)
shirron to fax to [facility]

## 2019-04-19 ENCOUNTER — Ambulatory Visit: Payer: No Typology Code available for payment source | Admitting: Internal Medicine

## 2019-04-19 ENCOUNTER — Other Ambulatory Visit (INDEPENDENT_AMBULATORY_CARE_PROVIDER_SITE_OTHER): Payer: No Typology Code available for payment source

## 2019-04-19 ENCOUNTER — Encounter: Payer: Self-pay | Admitting: Internal Medicine

## 2019-04-19 VITALS — BP 123/77 | HR 90 | Temp 98.0°F | Ht 75.0 in | Wt >= 6400 oz

## 2019-04-19 DIAGNOSIS — D649 Anemia, unspecified: Secondary | ICD-10-CM | POA: Diagnosis not present

## 2019-04-19 DIAGNOSIS — K601 Chronic anal fissure: Secondary | ICD-10-CM | POA: Diagnosis not present

## 2019-04-19 LAB — CBC WITH DIFFERENTIAL/PLATELET
Basophils Absolute: 0 10*3/uL (ref 0.0–0.1)
Basophils Relative: 0.5 % (ref 0.0–3.0)
Eosinophils Absolute: 0.2 10*3/uL (ref 0.0–0.7)
Eosinophils Relative: 1.6 % (ref 0.0–5.0)
HCT: 43.9 % (ref 39.0–52.0)
Hemoglobin: 14.5 g/dL (ref 13.0–17.0)
Lymphocytes Relative: 23.1 % (ref 12.0–46.0)
Lymphs Abs: 2.2 10*3/uL (ref 0.7–4.0)
MCHC: 33 g/dL (ref 30.0–36.0)
MCV: 92.3 fl (ref 78.0–100.0)
Monocytes Absolute: 0.5 10*3/uL (ref 0.1–1.0)
Monocytes Relative: 5.5 % (ref 3.0–12.0)
Neutro Abs: 6.7 10*3/uL (ref 1.4–7.7)
Neutrophils Relative %: 69.3 % (ref 43.0–77.0)
Platelets: 199 10*3/uL (ref 150.0–400.0)
RBC: 4.76 Mil/uL (ref 4.22–5.81)
RDW: 14.8 % (ref 11.5–15.5)
WBC: 9.6 10*3/uL (ref 4.0–10.5)

## 2019-04-19 LAB — FERRITIN: Ferritin: 78.9 ng/mL (ref 22.0–322.0)

## 2019-04-19 MED ORDER — AMBULATORY NON FORMULARY MEDICATION: 3 refills | Status: DC

## 2019-04-19 NOTE — Patient Instructions (Signed)
Your provider has requested that you go to the basement level for lab work before leaving today. Press "B" on the elevator. The lab is located at the first door on the left as you exit the elevator.   Stop your pantoprazole . I have taken this off your medicine list.   We are giving you a handout to read on Anal fissures.   Start Benefiber, one tablespoon nightly, handout provided.  Your provider has prescribed Diltiazem/Lidocaine gel for you. Please follow the directions written on your prescription bottle or given to you specifically by your provider. Since this is a specialty medication and is not readily available at most local pharmacies, we have sent your prescription to:  [facility] information is below: Address: [address] [place], [place] [address]  [phone]  *Please DO NOT go directly from our office to pick up this medication! Give the pharmacy 1 day to process the prescription as this is compounded and takes time to make.   Please follow up with Dr Carlean Purl in January. Call us early December to set up this appointment.    I appreciate the opportunity to care for you. Silvano Rusk, MD, [facility]

## 2019-04-19 NOTE — Progress Notes (Signed)
Larry Fowler 48 y.o. 03-Jun-1971 [id number] Referred by [name], MD  Assessment & Plan:   Encounter Diagnoses  Name Primary?  . Chronic posterior anal fissure Yes  . Mild anemia     Treat fissure with diltiazem/lidocaine 2% / 5% cream twice daily to 3 times daily Benefiber 1 tablespoon daily Recheck iron studies specifically ferritin.  I think he may have more of a chronic disease issue and he definitely lost blood with his quadriceps tendon ruptures and surgeries. He is in the age range and our colonoscopy for screening may make sense, and he has some other relative indications but I think with what I know and his size I would defer that at this time.  I think a prep in the colonoscopy would aggravate the fissure more so we will treat that.  He does not need to continue pantoprazole which was started when he was hospitalized as a prophylactic medication we will discontinue that.  He will return for follow-up in January  Lab Results  Component Value Date   FERRITIN 78.9 04/19/2019   Lab Results  Component Value Date   WBC 9.6 04/19/2019   HGB 14.5 04/19/2019   HCT 43.9 04/19/2019   MCV 92.3 04/19/2019   PLT 199.0 04/19/2019    I appreciate the opportunity to care for him.  Cc ;[name], MD  Subjective:   Chief Complaint: Anemia  HPI 48 year old married white man here because of anemia, he has a low TIBC and low iron saturation and a mild anemia.  He fell and ruptured quadricep tendons in the late winter was hospitalized he had a transfusion then.  Hemoglobin went from normal to the sevens and he was transfused during his February admission.  When he came home from that he noticed a raspberry jam like small amount of blood one time.  He has been having pain in the rectum prior to defecation that is relieved with defecation.  His wife will put Gerhardt's Butt cream in the perianal area to keep a rash from occurring and she feels something hard and firm  protruding.  He also has some intermittent left lower quadrant pain comes and goes seems to be relieved with defecation.  He has been on iron twice a day and wonders if it might be related to that.  Also thinks he has an inguinal hernia because when he coughs he can feel something bulging out in that area in the left groin.  Not diagnosed by any physician.  Stools are darker on the iron pill and a little bit more difficult to defecate. Allergies  Allergen Reactions  . Metformin And Related Nausea Only   Current Meds  Medication Sig  . albuterol (VENTOLIN HFA) 108 (90 Base) MCG/ACT inhaler Inhale 2 puffs into the lungs every 6 (six) hours as needed for wheezing.  . Blood Glucose Monitoring Suppl (ONE TOUCH ULTRA 2) w/Device KIT Use as directed once daily E11.9  . budesonide-formoterol (SYMBICORT) 80-4.5 MCG/ACT inhaler Inhale 2 puffs into the lungs 2 (two) times daily.  . clotrimazole-betamethasone (LOTRISONE) cream Apply 1 application topically 2 (two) times daily.  Marland Kitchen glipiZIDE (GLUCOTROL XL) 10 MG 24 hr tablet Take 1 tablet (10 mg total) by mouth daily with breakfast.  . glucose blood (ONE TOUCH ULTRA TEST) test strip Use as instructed once daily E11.9  . hydrocortisone (ANUSOL-HC) 25 MG suppository Place 1 suppository (25 mg total) rectally every 12 (twelve) hours.  . Hydrocortisone (GERHARDT'S BUTT CREAM) CREA Apply  1 application topically 2 (two) times daily as needed for irritation.  . iron polysaccharides (NU-IRON) 150 MG capsule Take 1 capsule (150 mg total) by mouth daily.  . Lancets MISC Use as directed once daily E11.9  . loratadine (CLARITIN) 10 MG tablet Take 1 tablet (10 mg total) by mouth daily.  . losartan (COZAAR) 25 MG tablet Take 1 tablet (25 mg total) by mouth daily.  . methocarbamol (ROBAXIN) 750 MG tablet Take 1 tablet (750 mg total) by mouth every 8 (eight) hours as needed for muscle spasms. (Patient taking differently: Take 750 mg by mouth every 8 (eight) hours as needed for  muscle spasms. Awaiting mail order delivery of this medication)  . oxyCODONE (OXY IR/ROXICODONE) 5 MG immediate release tablet Take 1 tablet (5 mg total) by mouth 2 (two) times daily as needed for severe pain.  . Vitamin D, Ergocalciferol, (DRISDOL) 1.25 MG (50000 UT) CAPS capsule Take 1 capsule (50,000 Units total) by mouth every 7 (seven) days.  . [DISCONTINUED] pantoprazole (PROTONIX) 40 MG tablet Take 1 tablet (40 mg total) by mouth daily.   Past Medical History:  Diagnosis Date  . ALLERGIC RHINITIS 09/12/2007   Qualifier: Diagnosis of  By: [name] MD, James W   . ANXIETY 02/16/2007   Qualifier: Diagnosis of  By: Brand RMA, Lucy    . ASTHMA 12/17/2009   Qualifier: Diagnosis of  By: [name] MD, James W   . Cannabis abuse    currently  . Chills with fever   . DEPRESSION 09/12/2007   Qualifier: Diagnosis of  By: [name] MD, James W   . History of cocaine use    in his 20's  . Hyperglycemia   . Rectal fistula 2009  . Rectal pain   . Renal stone    2008   Past Surgical History:  Procedure Laterality Date  . FINGER SURGERY  2008/09?   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 knee surgury     ? which knee per Dr Wainer in his teens  . REPAIR QUADRICEPS/HAMSTRING MUSCLES Bilateral 07/21/2018   Procedure: REPAIR BILATERAL QUADRICEPS;  Surgeon: Xu, Naiping M, MD;  Location: [facility] OR;  Service: Orthopedics;  Laterality: Bilateral;  . 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   Review of Systems Still needs walker to ambulate  LE edema Joint pains All other ROS negative  Objective:   Physical Exam @BP 123/77   Pulse 90   Temp 98 F (36.7 C)   Ht 6' 3" (1.905 m)   Wt (!) 520 lb (235.9 kg)   SpO2 98%   BMI 65.00  kg/m @  General: morbidly obese  Well-developed, well-nourished and in no acute distress Eyes:  anicteric. ENT:   Mouth and posterior pharynx free of lesions.  Neck:   supple w/o thyromegaly or mass.  Lungs: Clear to auscultation bilaterally. Heart:  S1S2, no rubs, murmurs, gallops. Abdomen:  obese soft, non-tender, no hepatosplenomegaly, hernia, or mass and BS+. I do not detect hernia Rectal: Posterior fissure - indurated with sentinel tag Lymph:  no cervical or supraclavicular adenopathy. Extremities:   no edema, cyanosis or clubbing Skin   intertrigo Neuro:  A&O x   3.  Psych:  appropriate mood and  Affect.   Data Reviewed: See HPI   

## 2019-04-20 NOTE — Progress Notes (Signed)
Larry Fowler,  Your iron level and hemoglobin are good but like the ferritin above 100  I think you should take the iron pill Monday Wednesday Friday for 1 month and quit  I appreciate the opportunity to care for you. Gatha Mayer, MD, [facility]

## 2019-04-21 ENCOUNTER — Encounter: Payer: Self-pay | Admitting: Internal Medicine

## 2019-04-22 ENCOUNTER — Encounter: Payer: Self-pay | Admitting: Internal Medicine

## 2019-05-08 ENCOUNTER — Encounter: Payer: Self-pay | Admitting: Internal Medicine

## 2019-05-08 LAB — HM DIABETES EYE EXAM

## 2019-05-10 MED FILL — NAPROXEN 500 MG TABLET: 500 | 45 days supply | Qty: 90 | Fill #0

## 2019-05-18 ENCOUNTER — Encounter: Payer: Self-pay | Admitting: Orthopaedic Surgery

## 2019-05-18 ENCOUNTER — Encounter: Payer: Self-pay | Admitting: Internal Medicine

## 2019-05-18 MED ORDER — CLOTRIMAZOLE-BETAMETHASONE 1-0.05 % EX CREA: 1.0000 "application " | TOPICAL_CREAM | Freq: Two times a day (BID) | CUTANEOUS | 2 refills | Status: DC

## 2019-05-18 MED FILL — CLOTRIMAZOLE-BETAMETHASONE: 1-0.05 | 15 days supply | Qty: 30 | Fill #0

## 2019-05-18 MED FILL — ALBUTEROL SULFATE HFA 108 (: 108 (90 BAS | 25 days supply | Qty: 18 | Fill #6

## 2019-05-18 NOTE — Telephone Encounter (Signed)
yes

## 2019-06-12 ENCOUNTER — Other Ambulatory Visit: Payer: Self-pay | Admitting: Internal Medicine

## 2019-06-12 MED FILL — SYMBICORT 80-4.5 MCG INH: 80-4.5 | 30 days supply | Qty: 10 | Fill #4

## 2019-06-12 MED FILL — ALBUTEROL SULFATE HFA 108 (: 108 (90 BAS | 25 days supply | Qty: 18 | Fill #7

## 2019-06-12 MED FILL — LOSARTAN POTASSIUM 25 MG TA: 25 | 30 days supply | Qty: 30 | Fill #0

## 2019-06-19 ENCOUNTER — Encounter: Payer: Self-pay | Admitting: Internal Medicine

## 2019-06-19 MED ORDER — SILVER SULFADIAZINE 1 % EX CREA: 1.0000 "application " | TOPICAL_CREAM | Freq: Every day | CUTANEOUS | 0 refills | Status: DC

## 2019-06-19 MED FILL — SSD 1% CREAM: 1 | 20 days supply | Qty: 50 | Fill #0

## 2019-07-02 ENCOUNTER — Ambulatory Visit (INDEPENDENT_AMBULATORY_CARE_PROVIDER_SITE_OTHER): Payer: No Typology Code available for payment source | Admitting: Internal Medicine

## 2019-07-02 ENCOUNTER — Telehealth: Payer: Self-pay | Admitting: *Deleted

## 2019-07-02 ENCOUNTER — Encounter: Payer: Self-pay | Admitting: Internal Medicine

## 2019-07-02 DIAGNOSIS — J4530 Mild persistent asthma, uncomplicated: Secondary | ICD-10-CM

## 2019-07-02 DIAGNOSIS — Z0289 Encounter for other administrative examinations: Secondary | ICD-10-CM

## 2019-07-02 DIAGNOSIS — E538 Deficiency of other specified B group vitamins: Secondary | ICD-10-CM

## 2019-07-02 DIAGNOSIS — E559 Vitamin D deficiency, unspecified: Secondary | ICD-10-CM

## 2019-07-02 DIAGNOSIS — Z0000 Encounter for general adult medical examination without abnormal findings: Secondary | ICD-10-CM

## 2019-07-02 DIAGNOSIS — F411 Generalized anxiety disorder: Secondary | ICD-10-CM

## 2019-07-02 DIAGNOSIS — E119 Type 2 diabetes mellitus without complications: Secondary | ICD-10-CM

## 2019-07-02 DIAGNOSIS — I10 Essential (primary) hypertension: Secondary | ICD-10-CM

## 2019-07-02 DIAGNOSIS — E611 Iron deficiency: Secondary | ICD-10-CM

## 2019-07-02 MED ORDER — GLIPIZIDE ER 10 MG PO TB24: 10.0000 mg | ORAL_TABLET | Freq: Every day | ORAL | 3 refills | Status: DC

## 2019-07-02 MED ORDER — NAPROXEN 500 MG PO TABS: 500.0000 mg | ORAL_TABLET | Freq: Two times a day (BID) | ORAL | 2 refills | Status: DC

## 2019-07-02 MED ORDER — ALBUTEROL SULFATE HFA 108 (90 BASE) MCG/ACT IN AERS: 2.0000 | INHALATION_SPRAY | Freq: Four times a day (QID) | RESPIRATORY_TRACT | 11 refills | Status: DC | PRN

## 2019-07-02 MED ORDER — LOSARTAN POTASSIUM 25 MG PO TABS: 25.0000 mg | ORAL_TABLET | Freq: Every day | ORAL | 3 refills | Status: DC

## 2019-07-02 MED FILL — NAPROXEN 500 MG TABLET: 500 | 45 days supply | Qty: 90 | Fill #0

## 2019-07-02 MED FILL — glipiZIDE ER 10 MG TB24: 10 | 90 days supply | Qty: 90 | Fill #0

## 2019-07-02 MED FILL — ALBUTEROL SULFATE HFA 108 (: 108 (90 BAS | 25 days supply | Qty: 18 | Fill #0

## 2019-07-02 NOTE — Patient Instructions (Signed)
Please continue all other medications as before, and refills have been done if requested.  Please have the pharmacy call with any other refills you may need.  Please continue your efforts at being more active, low cholesterol diet, and weight control.  Please keep your appointments with your specialists as you may have planned  Please go to the LAB at the blood drawing area for the tests to be done as you can  You will be contacted by phone if any changes need to be made immediately.  Otherwise, you will receive a letter about your results with an explanation, but please check with MyChart first.  Please remember to sign up for MyChart if you have not done so, as this will be important to you in the future with finding out test results, communicating by private email, and scheduling acute appointments online when needed.  Please make an Appointment to return in 6 months, or sooner if needed, also with Lab Appointment for testing done 3-5 days before at the FIRST FLOOR Lab (so this is for TWO appointments - please see the scheduling desk as you leave)

## 2019-07-02 NOTE — Assessment & Plan Note (Signed)
stable overall by history and exam, recent data reviewed with pt, and pt to continue medical treatment as before,  to f/u any worsening symptoms or concerns  

## 2019-07-02 NOTE — Assessment & Plan Note (Addendum)
stable overall by history and exam, recent data reviewed with pt, and pt to continue medical treatment as before,  to f/u any worsening symptoms or concerns, for lab as ordered  I spent 33 minutes preparing to see the patient by review of recent labs, imaging and procedures, obtaining and reviewing separately obtained history, communicating with the patient and family or caregiver, ordering medications, tests or procedures, and documenting clinical information in the EHR including the differential Dx, treatment, and any further evaluation and other management of DM, HTN, asthma, anxiety

## 2019-07-02 NOTE — Telephone Encounter (Signed)
-----   Message from [name], MD sent at 07/02/2019  2:17 PM EST ----- Regarding: lab appt first floor and ROV Plesae to contact pt -   1) need future first floor lab appt  2)  need CPX 6 mo (with lab appt 2-3 days prior to that as well)

## 2019-07-02 NOTE — Progress Notes (Signed)
Patient ID: Larry Fowler, male   DOB: [birth year], 49 y.o.   MRN: [id number]  Virtual Visit via Video Note  I connected with Larry Fowler on 07/02/19 at  2:00 PM EST by a video enabled telemedicine application and verified that I am speaking with the correct person using two identifiers.  Location: Patient: at home Provider: at office   I discussed the limitations of evaluation and management by telemedicine and the availability of in person appointments. The patient expressed understanding and agreed to proceed.  History of Present Illness: Here to f/u; overall doing ok,  Pt denies chest pain, increasing sob or doe, wheezing, orthopnea, PND, increased LE swelling, palpitations, dizziness or syncope.  Pt denies new neurological symptoms such as new headache, or facial or extremity weakness or numbness.  Pt denies polydipsia, polyuria, or low sugar episode.  Pt states overall good compliance with meds, mostly trying to follow appropriate diet, with wt overall stable,  but little exercise however.No new complaints Past Medical History:  Diagnosis Date  . ALLERGIC RHINITIS 09/12/2007   Qualifier: Diagnosis of  By: Jenny Reichmann MD, Hunt Oris   . ANXIETY 02/16/2007   Qualifier: Diagnosis of  By: Reatha Armour, Lucy    . ASTHMA 12/17/2009   Qualifier: Diagnosis of  By: Jenny Reichmann MD, Hunt Oris   . Cannabis abuse    currently  . Chills with fever   . DEPRESSION 09/12/2007   Qualifier: Diagnosis of  By: Jenny Reichmann MD, Hunt Oris   . History of cocaine use    in his 91's  . Hyperglycemia   . Rectal fistula 2009  . Rectal pain   . Renal stone    2008   Past Surgical History:  Procedure Laterality Date  . FINGER SURGERY  2008/09?   partial amputation of 3rd finger right hand, and reattachment of right index finger - Dr Sharol Given  . knee surgury     ? which knee per Dr Noemi Chapel in his teens  . REPAIR QUADRICEPS/HAMSTRING MUSCLES Bilateral 07/21/2018   Procedure: REPAIR BILATERAL QUADRICEPS;  Surgeon: Leandrew Koyanagi, MD;   Location: [facility];  Service: Orthopedics;  Laterality: Bilateral;  . TONSILLECTOMY      reports that he quit smoking about 8 years ago. He smoked 0.25 packs per day. He has never used smokeless tobacco. He reports current alcohol use. He reports current drug use. Drug: Marijuana. family history includes Diabetes type II in his mother; Pneumonia in his father. Allergies  Allergen Reactions  . Metformin And Related Nausea Only   Current Outpatient Medications on File Prior to Visit  Medication Sig Dispense Refill  . AMBULATORY NON FORMULARY MEDICATION Diltiazem gel 2% mixed with Lidocaine 5%  Apply a pea size amount inside rectum three times a day 30 g 3  . Blood Glucose Monitoring Suppl (ONE TOUCH ULTRA 2) w/Device KIT Use as directed once daily E11.9 1 each 0  . budesonide-formoterol (SYMBICORT) 80-4.5 MCG/ACT inhaler Inhale 2 puffs into the lungs 2 (two) times daily. 1 Inhaler 6  . clotrimazole-betamethasone (LOTRISONE) cream Apply 1 application topically 2 (two) times daily. 30 g 2  . glucose blood (ONE TOUCH ULTRA TEST) test strip Use as instructed once daily E11.9 100 each 12  . hydrocortisone (ANUSOL-HC) 25 MG suppository Place 1 suppository (25 mg total) rectally every 12 (twelve) hours. 12 suppository 1  . Hydrocortisone (GERHARDT'S BUTT CREAM) CREA Apply 1 application topically 2 (two) times daily as needed for irritation. 1 each 5  . iron  polysaccharides (NU-IRON) 150 MG capsule Take 1 capsule (150 mg total) by mouth daily. 90 capsule 1  . Lancets MISC Use as directed once daily E11.9 100 each 12  . loratadine (CLARITIN) 10 MG tablet Take 1 tablet (10 mg total) by mouth daily. 30 tablet 0  . methocarbamol (ROBAXIN) 750 MG tablet Take 1 tablet (750 mg total) by mouth every 8 (eight) hours as needed for muscle spasms. (Patient taking differently: Take 750 mg by mouth every 8 (eight) hours as needed for muscle spasms. Awaiting mail order delivery of this medication) 60 tablet 0  .  oxyCODONE (OXY IR/ROXICODONE) 5 MG immediate release tablet Take 1 tablet (5 mg total) by mouth 2 (two) times daily as needed for severe pain. 60 tablet 0  . silver sulfADIAZINE (SILVADENE) 1 % cream Apply 1 application topically daily. 50 g 0  . Vitamin D, Ergocalciferol, (DRISDOL) 1.25 MG (50000 UT) CAPS capsule Take 1 capsule (50,000 Units total) by mouth every 7 (seven) days. 12 capsule 0   No current facility-administered medications on file prior to visit.    Observations/Objective: Alert, NAD, appropriate mood and affect, resps normal, cn 2-12 intact, moves all 4s, no visible rash or swelling Lab Results  Component Value Date   WBC 9.6 04/19/2019   HGB 14.5 04/19/2019   HCT 43.9 04/19/2019   PLT 199.0 04/19/2019   GLUCOSE 241 (H) 12/26/2018   CHOL 182 12/26/2018   TRIG 109.0 12/26/2018   HDL 51.80 12/26/2018   LDLCALC 109 (H) 12/26/2018   ALT 29 12/26/2018   AST 32 12/26/2018   NA 137 12/26/2018   K 4.1 12/26/2018   CL 101 12/26/2018   CREATININE 0.58 12/26/2018   BUN 15 12/26/2018   CO2 25 12/26/2018   TSH 2.13 12/26/2018   PSA 0.08 (L) 12/26/2018   HGBA1C 8.8 (H) 12/26/2018   MICROALBUR 1.6 12/26/2018    Assessment and Plan: See notes  Follow Up Instructions: See notes   I discussed the assessment and treatment plan with the patient. The patient was provided an opportunity to ask questions and all were answered. The patient agreed with the plan and demonstrated an understanding of the instructions.   The patient was advised to call back or seek an in-person evaluation if the symptoms worsen or if the condition fails to improve as anticipated.  Cathlean Cower, MD

## 2019-07-03 NOTE — Telephone Encounter (Signed)
Left detailed message informing pt of below and to call back to schedule both appts below.

## 2019-07-05 ENCOUNTER — Encounter: Payer: Self-pay | Admitting: Internal Medicine

## 2019-07-05 MED ORDER — CLOTRIMAZOLE-BETAMETHASONE 1-0.05 % EX CREA: 1.0000 "application " | TOPICAL_CREAM | Freq: Two times a day (BID) | CUTANEOUS | 2 refills | Status: DC

## 2019-07-05 MED FILL — CLOTRIMAZOLE-BETAMETHASONE: 1-0.05 | 20 days supply | Qty: 45 | Fill #0

## 2019-07-19 ENCOUNTER — Encounter: Payer: Self-pay | Admitting: Orthopaedic Surgery

## 2019-07-19 MED FILL — LOSARTAN POTASSIUM 25 MG TA: 25 | 30 days supply | Qty: 30 | Fill #1

## 2019-07-19 NOTE — Telephone Encounter (Signed)
Yes that's fine 

## 2019-07-19 NOTE — Telephone Encounter (Signed)
For how long?

## 2019-08-14 MED FILL — LOSARTAN POTASSIUM 25 MG TA: 25 | 30 days supply | Qty: 30 | Fill #2

## 2019-08-22 MED FILL — ALBUTEROL SULFATE HFA 108 (: 108 (90 BAS | 25 days supply | Qty: 18 | Fill #1

## 2019-09-10 ENCOUNTER — Encounter: Payer: Self-pay | Admitting: Internal Medicine

## 2019-09-11 MED ORDER — CLOTRIMAZOLE-BETAMETHASONE 1-0.05 % EX CREA: 1.0000 "application " | TOPICAL_CREAM | Freq: Two times a day (BID) | CUTANEOUS | 2 refills | Status: DC

## 2019-09-11 MED FILL — CLOTRIMAZOLE-BETAMETHASONE: 1-0.05 | 14 days supply | Qty: 45 | Fill #0

## 2019-09-13 MED FILL — ALBUTEROL SULFATE HFA 108 (: 108 (90 BAS | 25 days supply | Qty: 18 | Fill #2

## 2019-10-03 ENCOUNTER — Encounter: Payer: Self-pay | Admitting: Internal Medicine

## 2019-10-03 DIAGNOSIS — R300 Dysuria: Secondary | ICD-10-CM

## 2019-10-03 NOTE — Telephone Encounter (Signed)
Staff to call pt - ok for urine studies at the lab (orders done)

## 2019-10-08 MED FILL — LOSARTAN POTASSIUM 25 MG TA: 25 | 90 days supply | Qty: 90 | Fill #0

## 2019-10-08 MED FILL — ALBUTEROL SULFATE HFA 108 (: 108 (90 BAS | 25 days supply | Qty: 18 | Fill #3

## 2019-11-02 MED FILL — ALBUTEROL SULFATE HFA 108 (: 108 (90 BAS | 25 days supply | Qty: 18 | Fill #4

## 2019-12-04 MED FILL — ALBUTEROL SULFATE HFA 108 (: 108 (90 BAS | 25 days supply | Qty: 18 | Fill #5

## 2019-12-04 MED FILL — CLOTRIMAZOLE-BETAMETHASONE: 1-0.05 | 14 days supply | Qty: 45 | Fill #1

## 2019-12-27 MED FILL — ALBUTEROL SULFATE HFA 108 (: 108 (90 BAS | 25 days supply | Qty: 18 | Fill #6

## 2019-12-28 ENCOUNTER — Other Ambulatory Visit: Payer: Self-pay

## 2019-12-28 ENCOUNTER — Encounter: Payer: Self-pay | Admitting: Internal Medicine

## 2019-12-28 ENCOUNTER — Ambulatory Visit (INDEPENDENT_AMBULATORY_CARE_PROVIDER_SITE_OTHER): Payer: No Typology Code available for payment source | Admitting: Internal Medicine

## 2019-12-28 VITALS — BP 140/80 | HR 73 | Temp 98.6°F | Ht 75.0 in

## 2019-12-28 DIAGNOSIS — E119 Type 2 diabetes mellitus without complications: Secondary | ICD-10-CM

## 2019-12-28 DIAGNOSIS — E538 Deficiency of other specified B group vitamins: Secondary | ICD-10-CM

## 2019-12-28 DIAGNOSIS — Z0000 Encounter for general adult medical examination without abnormal findings: Secondary | ICD-10-CM | POA: Diagnosis not present

## 2019-12-28 DIAGNOSIS — Z1159 Encounter for screening for other viral diseases: Secondary | ICD-10-CM | POA: Diagnosis not present

## 2019-12-28 DIAGNOSIS — F411 Generalized anxiety disorder: Secondary | ICD-10-CM

## 2019-12-28 DIAGNOSIS — R269 Unspecified abnormalities of gait and mobility: Secondary | ICD-10-CM

## 2019-12-28 DIAGNOSIS — Z20822 Contact with and (suspected) exposure to covid-19: Secondary | ICD-10-CM

## 2019-12-28 DIAGNOSIS — E559 Vitamin D deficiency, unspecified: Secondary | ICD-10-CM

## 2019-12-28 DIAGNOSIS — I10 Essential (primary) hypertension: Secondary | ICD-10-CM

## 2019-12-28 DIAGNOSIS — R21 Rash and other nonspecific skin eruption: Secondary | ICD-10-CM

## 2019-12-28 DIAGNOSIS — J4530 Mild persistent asthma, uncomplicated: Secondary | ICD-10-CM

## 2019-12-28 DIAGNOSIS — Z0001 Encounter for general adult medical examination with abnormal findings: Secondary | ICD-10-CM

## 2019-12-28 LAB — SARS-COV-2 IGG: SARS-COV-2 IgG: 0.03

## 2019-12-28 MED ORDER — TRIAMCINOLONE ACETONIDE 0.1 % EX CREA: 1.0000 "application " | TOPICAL_CREAM | Freq: Two times a day (BID) | CUTANEOUS | 1 refills | Status: AC

## 2019-12-28 MED FILL — TRIAMCINOLONE 0.1% CREAM: 0.1 | 10 days supply | Qty: 30 | Fill #0

## 2019-12-28 NOTE — Patient Instructions (Signed)
Please take all new medication as prescribed - the steroid cream  Please continue all other medications as before, and refills have been done if requested.  Please have the pharmacy call with any other refills you may need.  Please continue your efforts at being more active, low cholesterol diet, and weight control.  You are otherwise up to date with prevention measures today.  Please keep your appointments with your specialists as you may have planned  You are given the prescription for the new walker today  Please go to the LAB at the blood drawing area for the tests to be done  You will be contacted by phone if any changes need to be made immediately.  Otherwise, you will receive a letter about your results with an explanation, but please check with MyChart first.  Please remember to sign up for MyChart if you have not done so, as this will be important to you in the future with finding out test results, communicating by private email, and scheduling acute appointments online when needed.  Please make an Appointment to return in 6 months, or sooner if needed

## 2019-12-29 ENCOUNTER — Encounter: Payer: Self-pay | Admitting: Internal Medicine

## 2019-12-29 DIAGNOSIS — R269 Unspecified abnormalities of gait and mobility: Secondary | ICD-10-CM | POA: Insufficient documentation

## 2019-12-29 DIAGNOSIS — M6281 Muscle weakness (generalized): Secondary | ICD-10-CM | POA: Insufficient documentation

## 2019-12-29 NOTE — Assessment & Plan Note (Signed)

## 2019-12-29 NOTE — Assessment & Plan Note (Signed)
stable overall by history and exam, recent data reviewed with pt, and pt to continue medical treatment as before,  to f/u any worsening symptoms or concerns  

## 2019-12-29 NOTE — Assessment & Plan Note (Signed)
For walker asd,  to f/u any worsening symptoms or concerns

## 2019-12-29 NOTE — Progress Notes (Signed)
Subjective:    Patient ID: Larry Fowler, male    DOB: November 16, 1970, 49 y.o.   MRN: [id number]  HPI  Here for wellness and f/u;  Overall doing ok;  Pt denies Chest pain, worsening SOB, DOE, wheezing, orthopnea, PND, worsening LE edema, palpitations, dizziness or syncope.  Pt denies neurological change such as new headache, facial or extremity weakness.  Pt denies polydipsia, polyuria, or low sugar symptoms. Pt states overall good compliance with treatment and medications, good tolerability, and has been trying to follow appropriate diet.  Pt denies worsening depressive symptoms, suicidal ideation or panic. No fever, night sweats, wt loss, loss of appetite, or other constitutional symptoms.  Pt states good ability with ADL's, has low fall risk, home safety reviewed and adequate, no other significant changes in hearing or vision, and only occasionally active with exercise.  Did have a recent URI now resolved and wondering if might have been covid, did not get tested, and has not ben vaccinated.  Also has a rash to the right occciput area with itching for about 1 wk, not sure how started. Past Medical History:  Diagnosis Date  . ALLERGIC RHINITIS 09/12/2007   Qualifier: Diagnosis of  By: Jenny Reichmann MD, Hunt Oris   . ANXIETY 02/16/2007   Qualifier: Diagnosis of  By: Reatha Armour, Lucy    . ASTHMA 12/17/2009   Qualifier: Diagnosis of  By: Jenny Reichmann MD, Hunt Oris   . Cannabis abuse    currently  . Chills with fever   . DEPRESSION 09/12/2007   Qualifier: Diagnosis of  By: Jenny Reichmann MD, Hunt Oris   . History of cocaine use    in his 88's  . Hyperglycemia   . Rectal fistula 2009  . Rectal pain   . Renal stone    2008   Past Surgical History:  Procedure Laterality Date  . FINGER SURGERY  2008/09?   partial amputation of 3rd finger right hand, and reattachment of right index finger - Dr Sharol Given  . knee surgury     ? which knee per Dr Noemi Chapel in his teens  . REPAIR QUADRICEPS/HAMSTRING MUSCLES Bilateral 07/21/2018    Procedure: REPAIR BILATERAL QUADRICEPS;  Surgeon: Leandrew Koyanagi, MD;  Location: [facility];  Service: Orthopedics;  Laterality: Bilateral;  . TONSILLECTOMY      reports that he quit smoking about 8 years ago. He smoked 0.25 packs per day. He has never used smokeless tobacco. He reports current alcohol use. He reports current drug use. Drug: Marijuana. family history includes Diabetes type II in his mother; Pneumonia in his father. Allergies  Allergen Reactions  . Metformin And Related Nausea Only   Current Outpatient Medications on File Prior to Visit  Medication Sig Dispense Refill  . albuterol (VENTOLIN HFA) 108 (90 Base) MCG/ACT inhaler Inhale 2 puffs into the lungs every 6 (six) hours as needed for wheezing. 18 g 11  . AMBULATORY NON FORMULARY MEDICATION Diltiazem gel 2% mixed with Lidocaine 5%  Apply a pea size amount inside rectum three times a day 30 g 3  . Blood Glucose Monitoring Suppl (ONE TOUCH ULTRA 2) w/Device KIT Use as directed once daily E11.9 1 each 0  . clotrimazole-betamethasone (LOTRISONE) cream Apply 1 application topically 2 (two) times daily. 45 g 2  . glipiZIDE (GLUCOTROL XL) 10 MG 24 hr tablet Take 1 tablet (10 mg total) by mouth daily with breakfast. 90 tablet 3  . glucose blood (ONE TOUCH ULTRA TEST) test strip Use as instructed once daily  E11.9 100 each 12  . Hydrocortisone (GERHARDT'S BUTT CREAM) CREA Apply 1 application topically 2 (two) times daily as needed for irritation. 1 each 5  . Lancets MISC Use as directed once daily E11.9 100 each 12  . loratadine (CLARITIN) 10 MG tablet Take 1 tablet (10 mg total) by mouth daily. 30 tablet 0  . naproxen (NAPROSYN) 500 MG tablet Take 1 tablet (500 mg total) by mouth 2 (two) times daily with a meal. Take 500 mg by mouth 2 (two) times daily with a meal as needed 90 tablet 2  . oxyCODONE (OXY IR/ROXICODONE) 5 MG immediate release tablet Take 1 tablet (5 mg total) by mouth 2 (two) times daily as needed for severe pain. 60 tablet 0   . silver sulfADIAZINE (SILVADENE) 1 % cream Apply 1 application topically daily. 50 g 0   No current facility-administered medications on file prior to visit.   Review of Systems All otherwise neg per pt'    Objective:   Physical Exam BP (!) 140/80 (BP Location: Left Arm, Patient Position: Sitting, Cuff Size: Large)   Pulse 73   Temp 98.6 F (37 C) (Oral)   Ht _0  (1.905 m)   SpO2 95%   BMI 65.00 kg/m  VS noted, super morbid obese Constitutional: Pt appears in NAD HENT: Head: NCAT.  Right Ear: External ear normal.  Left Ear: External ear normal.  Eyes: . Pupils are equal, round, and reactive to light. Conjunctivae and EOM are normal Nose: without d/c or deformity Neck: Neck supple. Gross normal ROM Cardiovascular: Normal rate and regular rhythm.   Pulmonary/Chest: Effort normal and breath sounds without rales or wheezing.  Abd:  Soft, NT, ND, + BS, no organomegaly Neurological: Pt is alert. At baseline orientation, motor grossly intact Skin: Skin is warm.+rashes about a 2 cm area nontender right occiput at the hairline, no other new lesions, no LE edema Psychiatric: Pt behavior is normal without agitation  All otherwise neg per pt Lab Results  Component Value Date   WBC 9.6 04/19/2019   HGB 14.5 04/19/2019   HCT 43.9 04/19/2019   PLT 199.0 04/19/2019   GLUCOSE 241 (H) 12/26/2018   CHOL 182 12/26/2018   TRIG 109.0 12/26/2018   HDL 51.80 12/26/2018   LDLCALC 109 (H) 12/26/2018   ALT 29 12/26/2018   AST 32 12/26/2018   NA 137 12/26/2018   K 4.1 12/26/2018   CL 101 12/26/2018   CREATININE 0.58 12/26/2018   BUN 15 12/26/2018   CO2 25 12/26/2018   TSH 2.13 12/26/2018   PSA 0.08 (L) 12/26/2018   HGBA1C 8.8 (H) 12/26/2018   MICROALBUR 1.6 12/26/2018      Assessment & Plan:

## 2019-12-29 NOTE — Assessment & Plan Note (Signed)
Mild to mod, for triam cr pan, to f/u any worsening symptoms or concerns  I spent 31 minutes in addition to time for CPX wellness examination in preparing to see the patient by review of recent labs, imaging and procedures, obtaining and reviewing separately obtained history, communicating with the patient and family or caregiver, ordering medications, tests or procedures, and documenting clinical information in the EHR including the differential Dx, treatment, and any further evaluation and other management of rash, htn, dm, asthma, anxiety, gait disorder

## 2019-12-31 ENCOUNTER — Encounter: Payer: Self-pay | Admitting: Internal Medicine

## 2019-12-31 ENCOUNTER — Other Ambulatory Visit: Payer: Self-pay | Admitting: Internal Medicine

## 2019-12-31 DIAGNOSIS — L989 Disorder of the skin and subcutaneous tissue, unspecified: Secondary | ICD-10-CM

## 2019-12-31 DIAGNOSIS — R739 Hyperglycemia, unspecified: Secondary | ICD-10-CM

## 2019-12-31 LAB — HEMOGLOBIN A1C
Hgb A1c MFr Bld: 8.8 % of total Hgb — ABNORMAL HIGH (ref <5.7)
Mean Plasma Glucose: 206 (calc)
eAG (mmol/L): 11.4 (calc)

## 2019-12-31 LAB — CBC WITH DIFFERENTIAL/PLATELET
Absolute Monocytes: 559 cells/uL (ref 200–950)
Basophils Absolute: 26 cells/uL (ref 0–200)
Basophils Relative: 0.3 %
Eosinophils Absolute: 120 cells/uL (ref 15–500)
Eosinophils Relative: 1.4 %
HCT: 43.1 % (ref 38.5–50.0)
Hemoglobin: 14.3 g/dL (ref 13.2–17.1)
Lymphs Abs: 2313 cells/uL (ref 850–3900)
MCH: 31.6 pg (ref 27.0–33.0)
MCHC: 33.2 g/dL (ref 32.0–36.0)
MCV: 95.1 fL (ref 80.0–100.0)
MPV: 9.9 fL (ref 7.5–12.5)
Monocytes Relative: 6.5 %
Neutro Abs: 5581 cells/uL (ref 1500–7800)
Neutrophils Relative %: 64.9 %
Platelets: 166 10*3/uL (ref 140–400)
RBC: 4.53 10*6/uL (ref 4.20–5.80)
RDW: 13.1 % (ref 11.0–15.0)
Total Lymphocyte: 26.9 %
WBC: 8.6 10*3/uL (ref 3.8–10.8)

## 2019-12-31 LAB — LIPID PANEL
Cholesterol: 179 mg/dL (ref <200)
HDL: 48 mg/dL (ref >=40)
LDL Cholesterol (Calc): 108 mg/dL (calc) — ABNORMAL HIGH
Non-HDL Cholesterol (Calc): 131 mg/dL (calc) — ABNORMAL HIGH (ref <130)
Total CHOL/HDL Ratio: 3.7 (calc) (ref <5.0)
Triglycerides: 123 mg/dL (ref <150)

## 2019-12-31 LAB — HEPATITIS C ANTIBODY
Hepatitis C Ab: NONREACTIVE
SIGNAL TO CUT-OFF: 0.01 (ref <1.00)

## 2019-12-31 LAB — HEPATIC FUNCTION PANEL
AG Ratio: 1.9 (calc) (ref 1.0–2.5)
ALT: 26 U/L (ref 9–46)
AST: 26 U/L (ref 10–40)
Albumin: 4 g/dL (ref 3.6–5.1)
Alkaline phosphatase (APISO): 87 U/L (ref 36–130)
Bilirubin, Direct: 0.1 mg/dL (ref 0.0–0.2)
Globulin: 2.1 g/dL (calc) (ref 1.9–3.7)
Indirect Bilirubin: 0.4 mg/dL (calc) (ref 0.2–1.2)
Total Bilirubin: 0.5 mg/dL (ref 0.2–1.2)
Total Protein: 6.1 g/dL (ref 6.1–8.1)

## 2019-12-31 LAB — VITAMIN B12: Vitamin B-12: 535 pg/mL (ref 200–1100)

## 2019-12-31 LAB — PSA: PSA: <0.1 ng/mL (ref <=4.0)

## 2019-12-31 LAB — VITAMIN D 25 HYDROXY (VIT D DEFICIENCY, FRACTURES): Vit D, 25-Hydroxy: 18 ng/mL — ABNORMAL LOW (ref 30–100)

## 2019-12-31 LAB — TSH: TSH: 2.62 mIU/L (ref 0.40–4.50)

## 2019-12-31 MED ORDER — VITAMIN D (ERGOCALCIFEROL) 1.25 MG (50000 UNIT) PO CAPS: 50000.0000 [IU] | ORAL_CAPSULE | ORAL | 0 refills | Status: DC

## 2019-12-31 MED ORDER — PIOGLITAZONE HCL 30 MG PO TABS
30.0000 mg | ORAL_TABLET | Freq: Every day | ORAL | 3 refills | Status: DC
Start: 2019-12-31 — End: 2022-03-01

## 2019-12-31 MED FILL — PIOGLITAZONE HCL 30 MG TAB: 30 | 90 days supply | Qty: 90 | Fill #0

## 2019-12-31 MED FILL — VIT D2 1.25 MG (50,000 UNIT: 1.25 MG | 84 days supply | Qty: 12 | Fill #0

## 2020-01-03 ENCOUNTER — Encounter: Payer: Self-pay | Admitting: Internal Medicine

## 2020-01-15 ENCOUNTER — Other Ambulatory Visit (HOSPITAL_COMMUNITY): Payer: Self-pay | Admitting: Dermatology

## 2020-01-23 ENCOUNTER — Encounter: Payer: Self-pay | Admitting: Internal Medicine

## 2020-01-29 MED FILL — glipiZIDE ER 10 MG TB24: 10 | 90 days supply | Qty: 90 | Fill #1

## 2020-01-29 MED FILL — ALBUTEROL SULFATE HFA 108 (: 108 (90 BAS | 25 days supply | Qty: 18 | Fill #7

## 2020-01-29 MED FILL — LOSARTAN POTASSIUM 25 MG TA: 25 | 90 days supply | Qty: 90 | Fill #1

## 2020-01-30 ENCOUNTER — Ambulatory Visit: Payer: Self-pay | Admitting: *Deleted

## 2020-01-30 NOTE — Telephone Encounter (Signed)
Sent to Dr. [name]. 

## 2020-01-30 NOTE — Telephone Encounter (Signed)
Message from Angela Nevin sent at 01/30/2020 12:55 PM EDT  Summary: covid question   Patient was exposed to covid yesterday though positive coworker and inquired if he should be tested.          Will route to PCP for recommendations.

## 2020-01-30 NOTE — Telephone Encounter (Signed)
Yes, please ask pt to go [facility] website to schedule testing at 3-5 days (or as soon possible after exposure) to be checked; he may wish to self quarantine until then, but only if the exposure was significant such as spending > 15 min within 6 feet of a person who is a known covid +

## 2020-01-31 NOTE — Telephone Encounter (Signed)
Tried calling pt to inform him of Dr. Raphael Gibney note "Yes, please ask pt to go [facility] to schedule testing at 3-5 days (or as soon possible after exposure) to be checked; he may wish to self quarantine until then, but only if the exposure was significant such as spending > 15 min within 6 feet of a person who is a known covid +"  **Pt phone just kept ringing out.

## 2020-02-05 IMAGING — CT CT KNEE*R* W/O CM
3 series · 16 of 33 positions shown, 19 images · non-contrast
Comparison: None.

CLINICAL DATA: Fell.  Severe pain.

EXAM:
CT OF THE right KNEE WITHOUT CONTRAST
TECHNIQUE: Multidetector CT imaging of the right knee was performed according
to the standard protocol. Multiplanar CT image reconstructions were
also generated.

[Series 4: lfov ext 3.0 b40s · axial · 0.63mm/px · z∈[-1076,-822]mm · 8 of 101 slices shown, 10 images]
[im 8/101  soft-tissue]
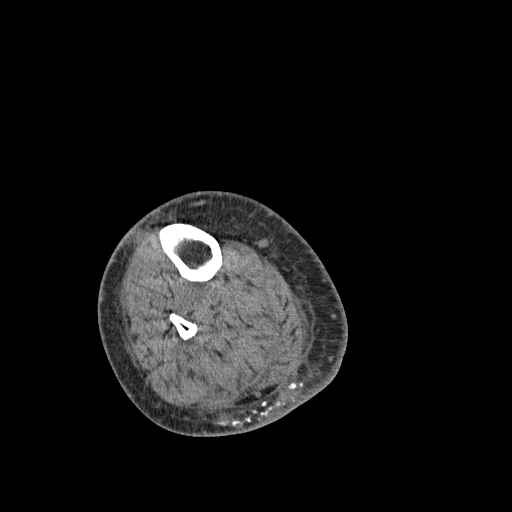
[im 8/101  bone]
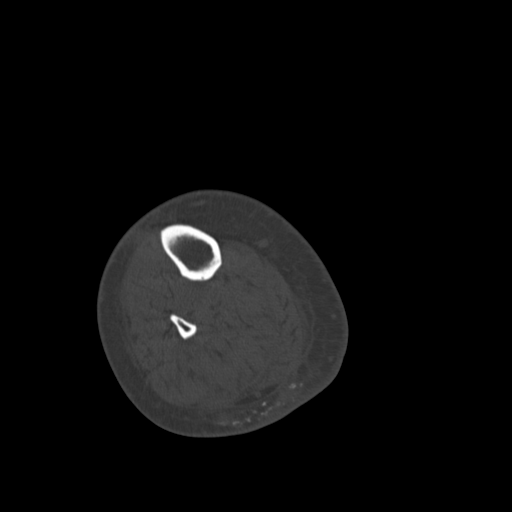
[im 24/101  bone]
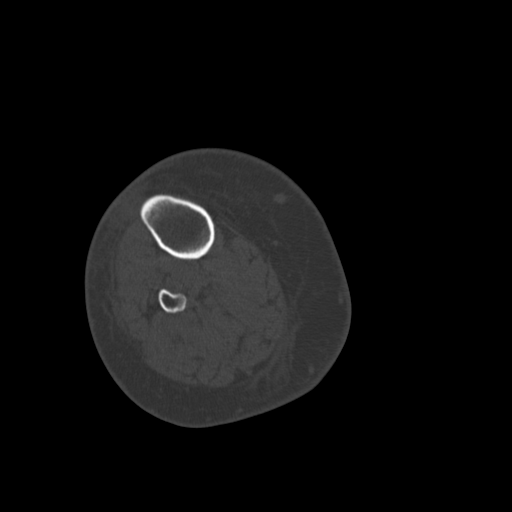
[im 31/101  bone]
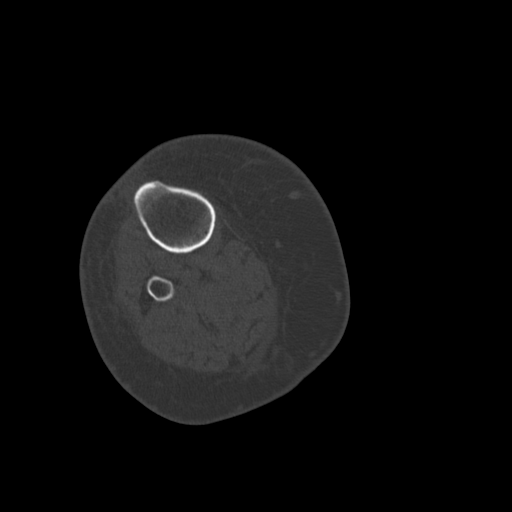
[im 47/101  bone]
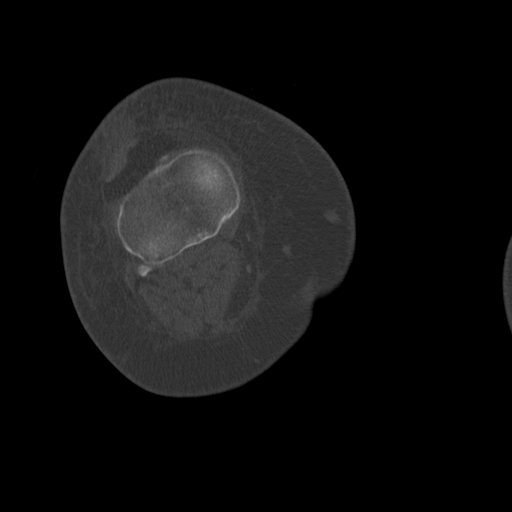
[im 54/101  soft-tissue]
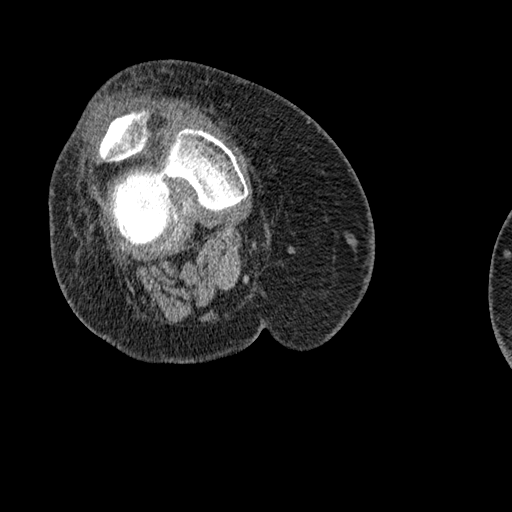
[im 54/101  bone]
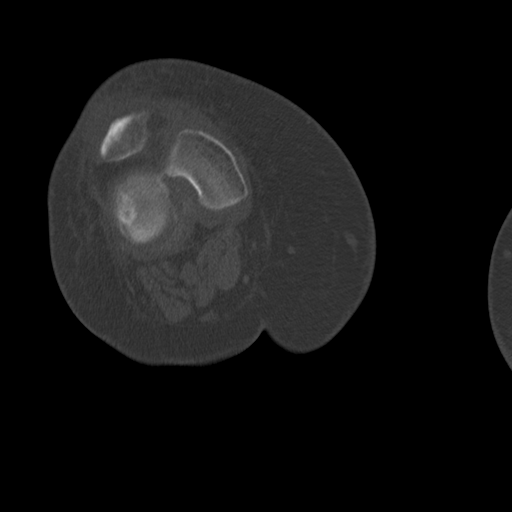
[im 70/101  bone]
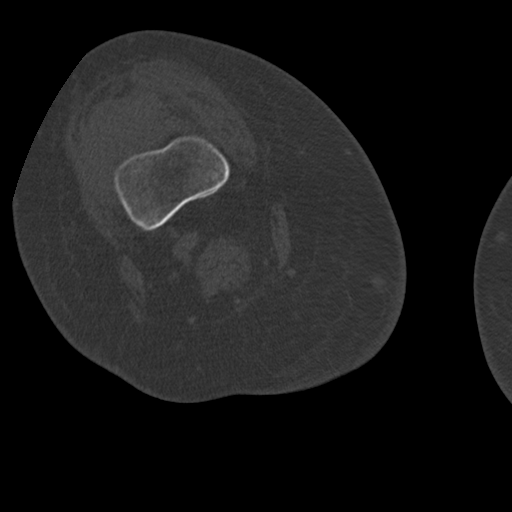
[im 77/101  bone]
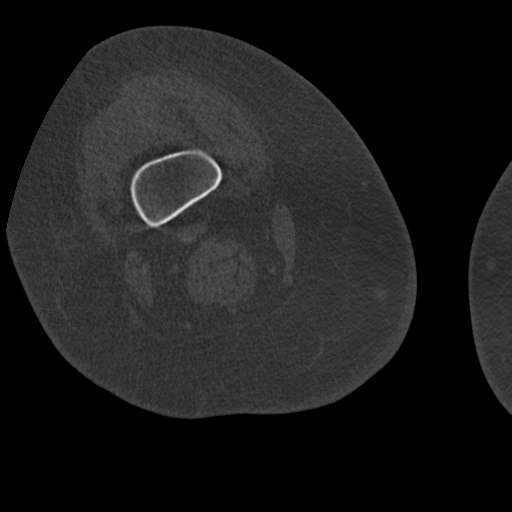
[im 93/101  bone]
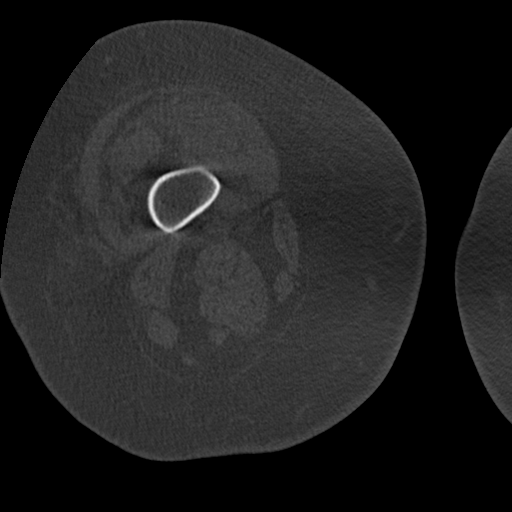

[Series 9: coronalsoft tissuert · coronal · 0.57mm/px · 3 of 143 slices shown]
[im 29/143  bone]
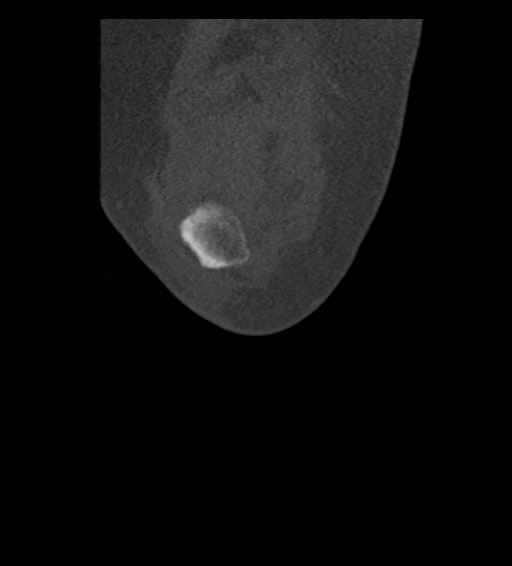
[im 57/143  bone]
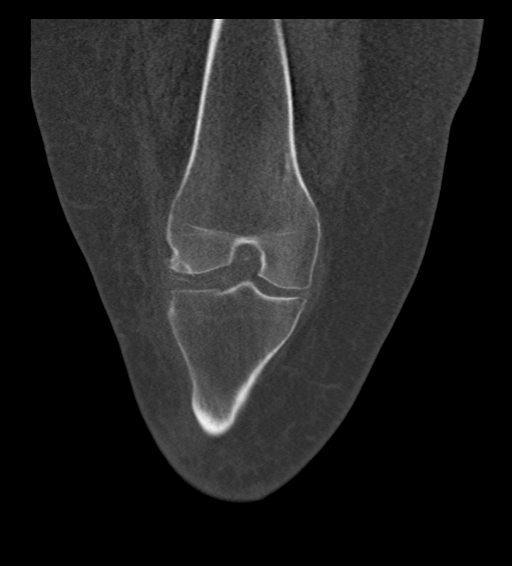
[im 86/143  bone]
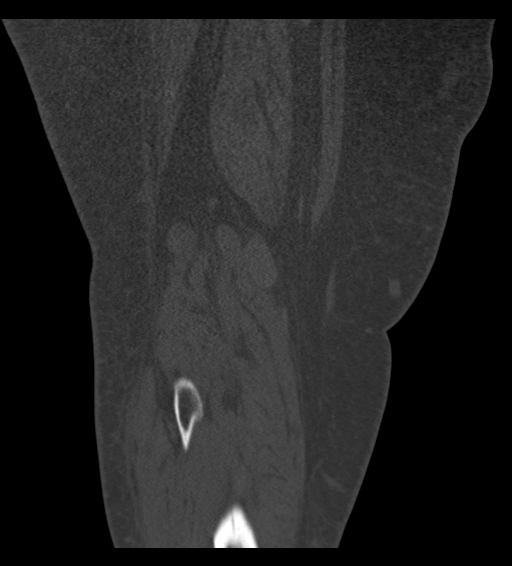

[Series 10: sagittalsoft tissuert · sagittal · 0.63mm/px · 5 of 136 slices shown, 6 images]
[im 46/136  bone]
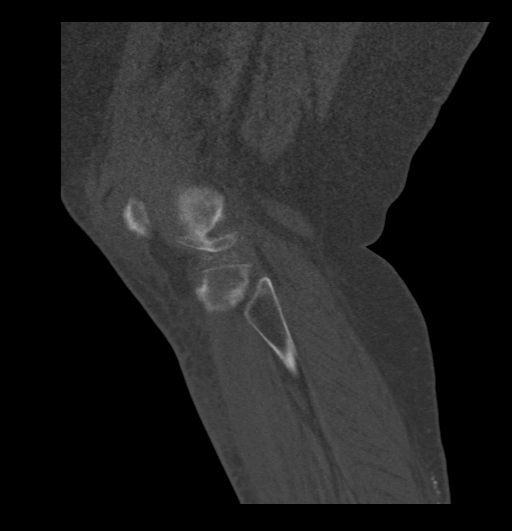
[im 57/136  bone]
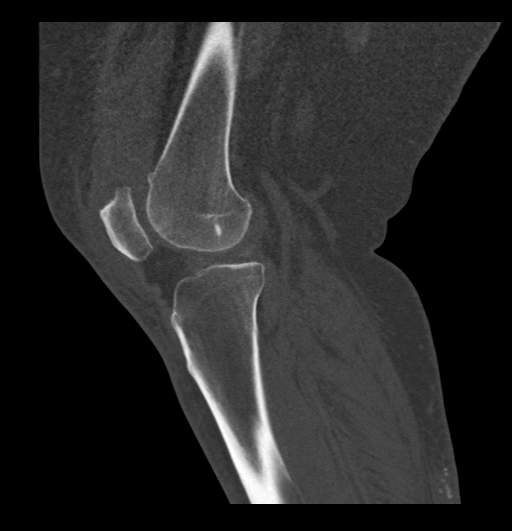
[im 68/136  soft-tissue]
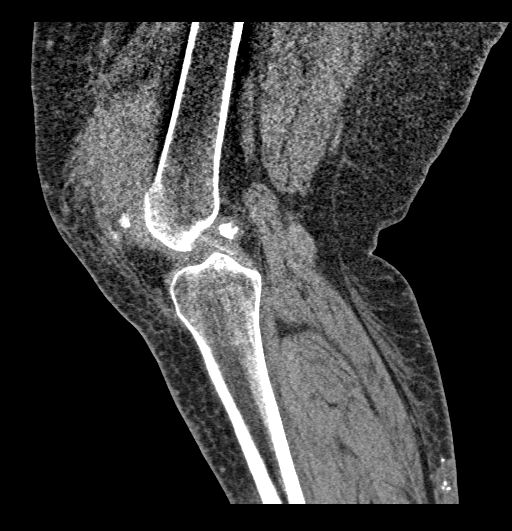
[im 68/136  bone]
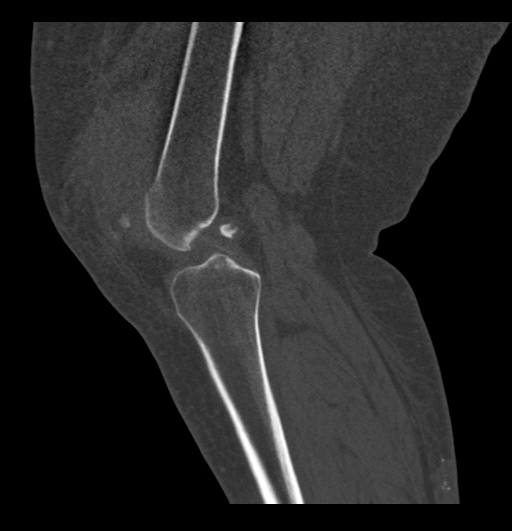
[im 79/136  bone]
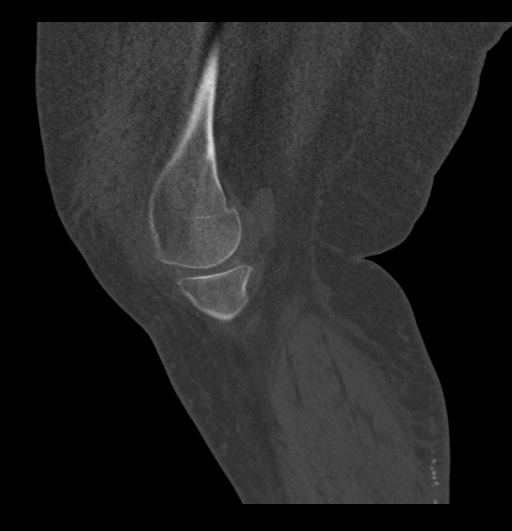
[im 91/136  bone]
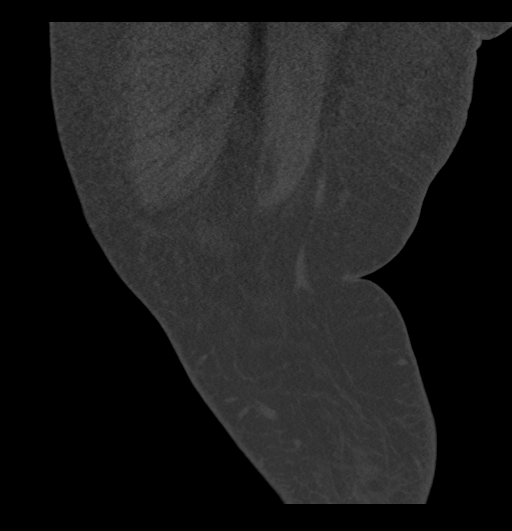

[16 of 33 positions shown; findings below may reference images not displayed]

FINDINGS: No acute fracture is identified. There is a calcific density in the
posterior joint space posterior to the PCL. The PCL also
demonstrates some streaky calcifications.

Chondrocalcinosis is noted involving both the medial and lateral
menisci. Findings could be due to CPPD arthropathy.

The patella is lying low (patella [place]). The patellar tendon is wavy
and redundant. I suspect the quadriceps tendon is completely
ruptured and there is a large hematoma and joint effusion in this
area. Recommend orthopedic consultation and MRI would be the best
test for further evaluation.
IMPRESSION: 1. Suspect completely ruptured quadriceps tendon. Associated large
hematoma and suprapatellar knee joint effusion. Recommend orthopedic
consultation. MRI would be helpful for further evaluation.
2. Chondrocalcinosis suggesting CPPD arthropathy.
3. No acute fractures are demonstrated.

## 2020-02-05 IMAGING — DX DG KNEE COMPLETE 4+V*L*
4 series · 4 of 4 positions shown · non-contrast
Comparison: None.

CLINICAL DATA: Pt c/o bilateral knee pain after slipping and
falling on a wet floor today. Hx of previous arthroscopy knee
surgery; pt does not know which laterality was operated on. Best
obtainable images due to pt condition.

EXAM:
LEFT KNEE - COMPLETE 4+ VIEW

[x knee ap left]
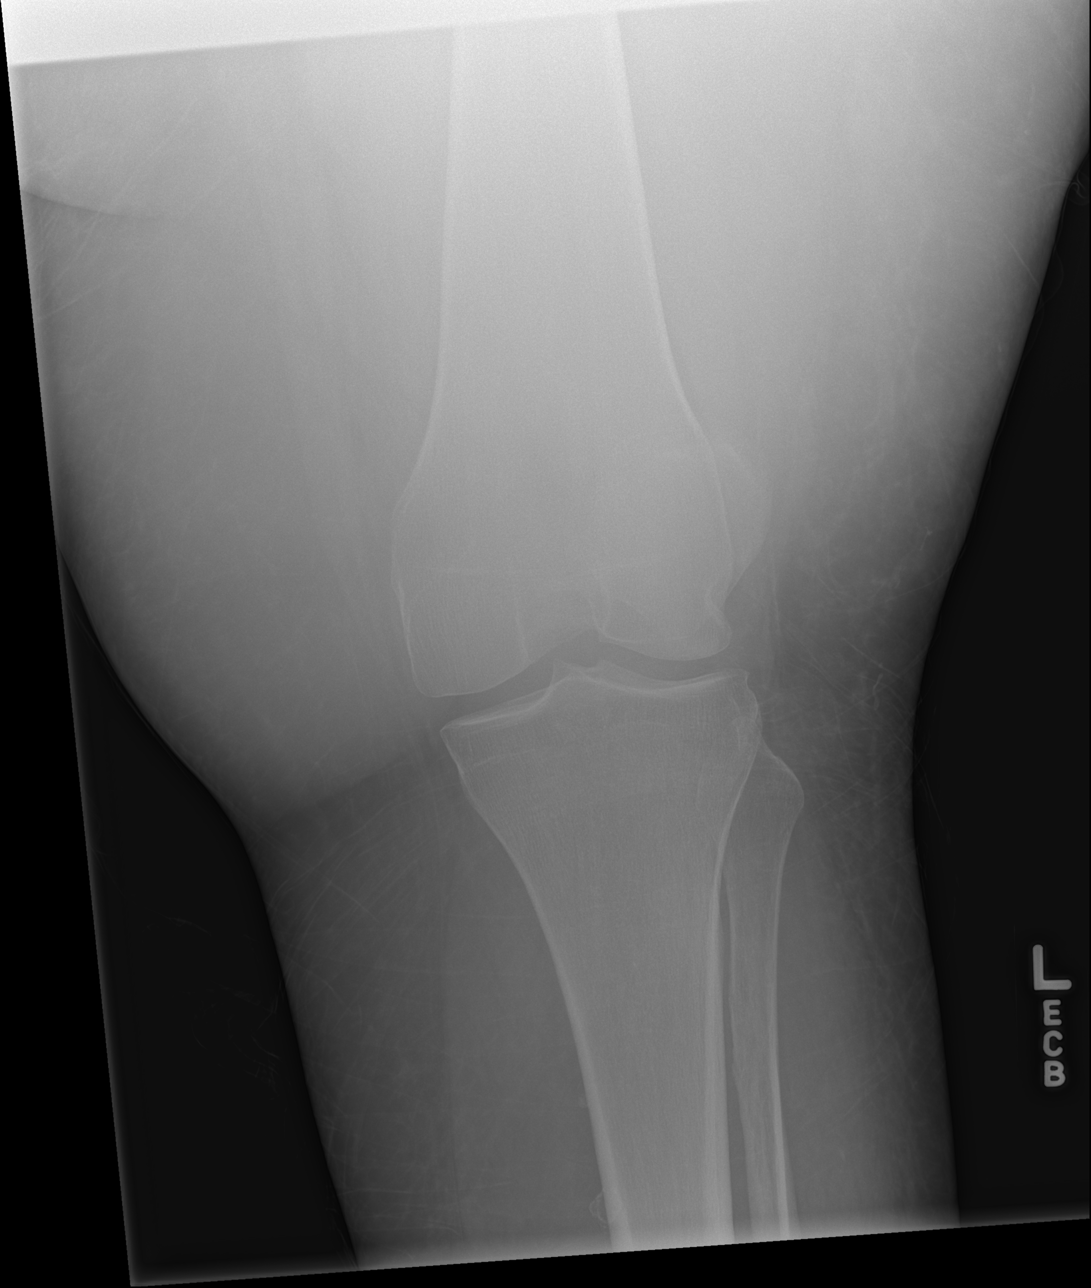

[x knee obl left (1 of 2)]
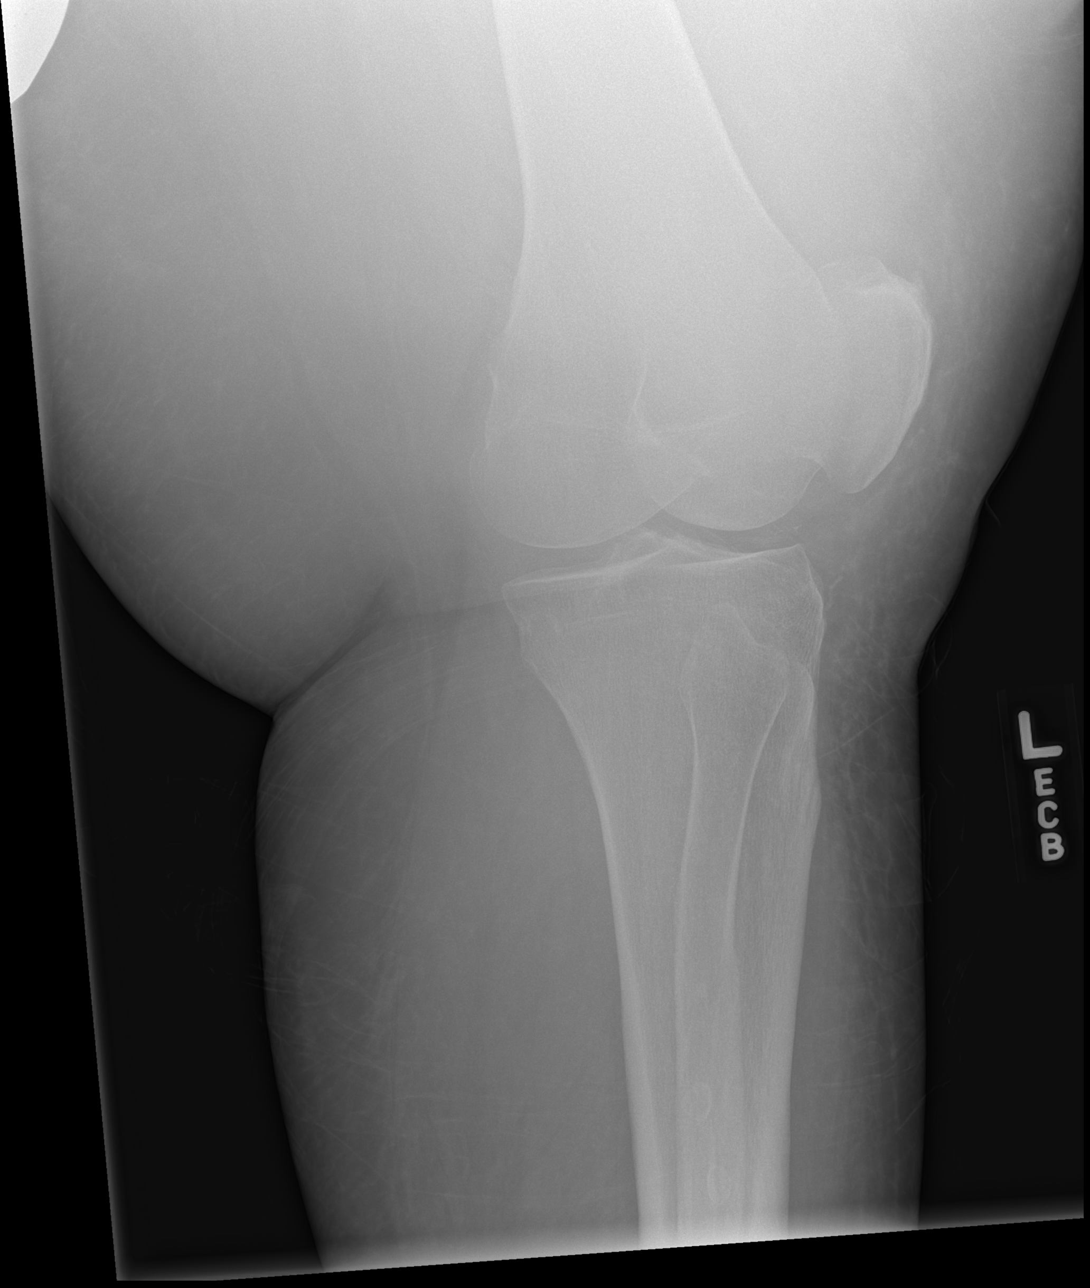

[x knee obl left (2 of 2)]
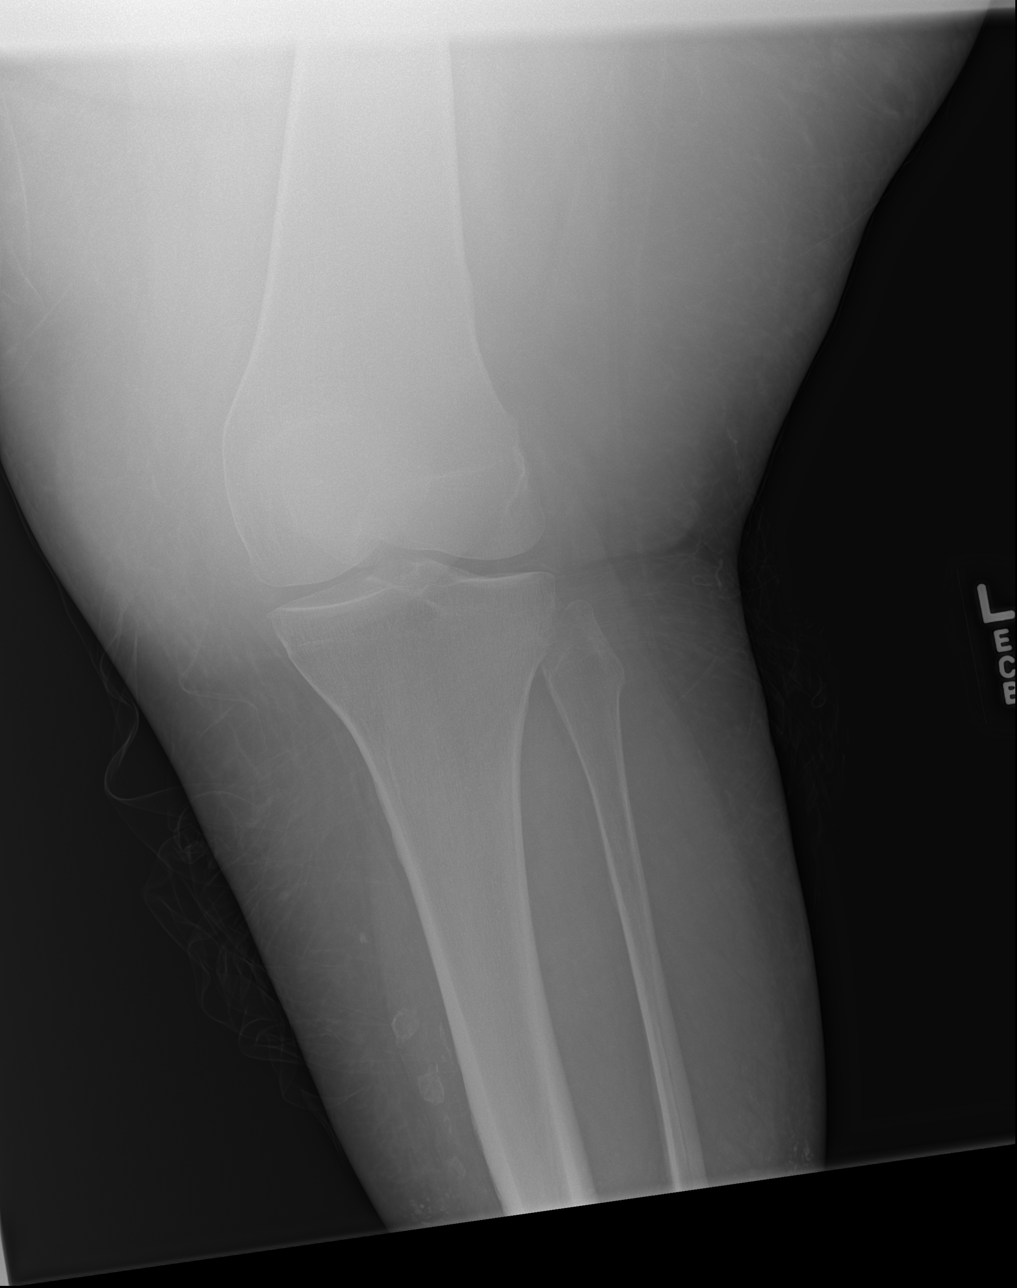

[x knee lat left]
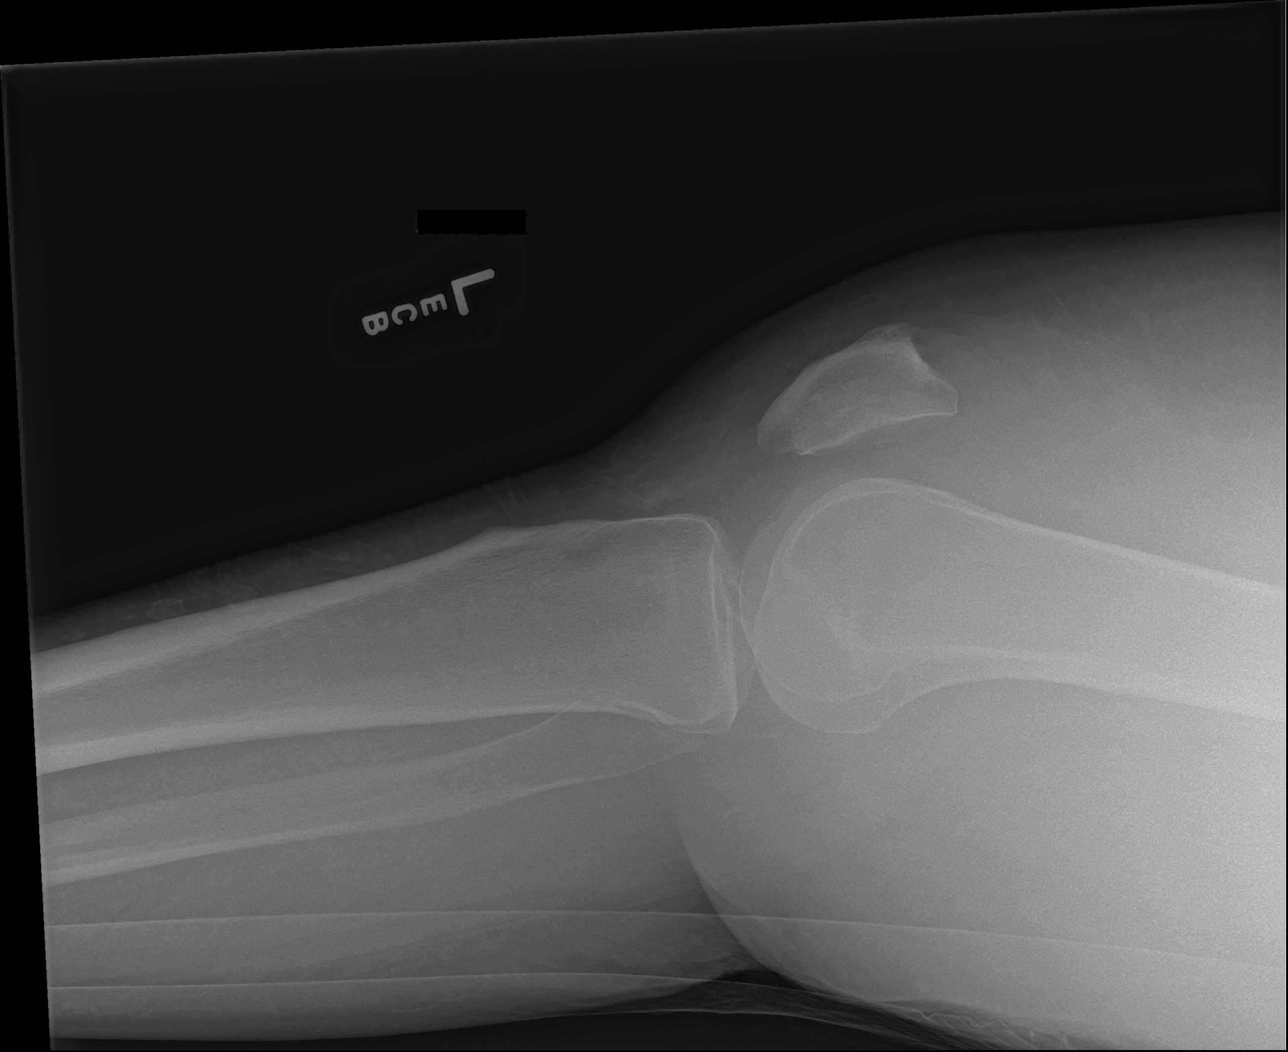

[4 of 4 positions shown; findings below may reference images not displayed]

FINDINGS: No evidence of fracture, dislocation, or joint effusion. No evidence
of arthropathy or other focal bone abnormality. Soft tissues are
unremarkable.
IMPRESSION: Negative.

## 2020-02-12 MED FILL — BETAMETHASONE DP 0.05% CRM: 0.05 | 30 days supply | Qty: 45 | Fill #0

## 2020-02-18 MED FILL — ALBUTEROL SULFATE HFA 108 (: 108 (90 BAS | 25 days supply | Qty: 18 | Fill #8

## 2020-02-29 MED FILL — NAPROXEN 500 MG TABLET: 500 | 45 days supply | Qty: 90 | Fill #1

## 2020-03-11 ENCOUNTER — Other Ambulatory Visit: Payer: Self-pay | Admitting: Internal Medicine

## 2020-03-11 MED FILL — ALBUTEROL SULFATE HFA 108 (: 108 (90 BAS | 25 days supply | Qty: 18 | Fill #9

## 2020-03-11 NOTE — Telephone Encounter (Signed)
Please change to OTC Vitamin D3 at 2000 units per day, indefinitely.  

## 2020-03-31 MED FILL — ALBUTEROL SULFATE HFA 108 (: 108 (90 BAS | 25 days supply | Qty: 18 | Fill #10

## 2020-03-31 MED FILL — BETAMETHASONE DP 0.05% CRM: 0.05 | 30 days supply | Qty: 45 | Fill #1

## 2020-04-17 MED FILL — CLOTRIMAZOLE-BETAMETHASONE: 1-0.05 | 14 days supply | Qty: 45 | Fill #2

## 2020-04-28 LAB — HM DIABETES EYE EXAM

## 2020-05-05 ENCOUNTER — Encounter: Payer: Self-pay | Admitting: Internal Medicine

## 2020-05-27 ENCOUNTER — Other Ambulatory Visit: Payer: Self-pay | Admitting: Pulmonary Disease

## 2020-07-10 ENCOUNTER — Encounter: Payer: Self-pay | Admitting: Internal Medicine

## 2020-07-11 ENCOUNTER — Telehealth: Payer: Self-pay

## 2020-07-11 MED ORDER — ALBUTEROL SULFATE HFA 108 (90 BASE) MCG/ACT IN AERS: 2.0000 | INHALATION_SPRAY | Freq: Four times a day (QID) | RESPIRATORY_TRACT | 11 refills | Status: DC | PRN

## 2020-07-11 NOTE — Addendum Note (Signed)
Addended by: Claudette Laws D on: 07/11/2020 04:37 PM   Modules accepted: Orders

## 2020-07-11 NOTE — Telephone Encounter (Signed)
[id number] - [place], [place] - [address] AT [facility] [address] Phone:  [phone]  Fax:  [phone]     Patient wants the medication to go here

## 2020-08-04 ENCOUNTER — Encounter: Payer: Self-pay | Admitting: Internal Medicine

## 2020-08-05 ENCOUNTER — Other Ambulatory Visit: Payer: Self-pay | Admitting: Internal Medicine

## 2020-08-05 MED ORDER — GLIPIZIDE ER 10 MG PO TB24
10.0000 mg | ORAL_TABLET | Freq: Every day | ORAL | 1 refills | Status: DC
Start: 2020-08-05 — End: 2022-03-01

## 2020-10-20 ENCOUNTER — Telehealth: Payer: Self-pay | Admitting: Internal Medicine

## 2020-10-20 NOTE — Telephone Encounter (Signed)
losartan (COZAAR) 25 MG tablet  [facility] [address], [place] - [address] Phone:  [phone]  Fax:  [phone]     Last seen- 07.23.21 Next apt- n.a

## 2020-10-21 NOTE — Telephone Encounter (Signed)
Patient verified date of birth and has been made aware of the medication was stopped.

## 2020-10-21 NOTE — Telephone Encounter (Signed)
No, this was stopped in July 2021

## 2021-02-06 ENCOUNTER — Encounter: Payer: Self-pay | Admitting: Internal Medicine

## 2021-02-06 MED ORDER — AMOXICILLIN 500 MG PO CAPS: 1000.0000 mg | ORAL_CAPSULE | Freq: Two times a day (BID) | ORAL | 0 refills | Status: AC

## 2021-04-03 ENCOUNTER — Encounter: Payer: Self-pay | Admitting: Internal Medicine

## 2021-04-10 MED ORDER — BETAMETHASONE DIPROPIONATE 0.05 % EX CREA
TOPICAL_CREAM | Freq: Two times a day (BID) | CUTANEOUS | 0 refills | Status: DC
  Filled 2021-04-10: qty 45, 30d supply, fill #0

## 2021-04-10 MED ORDER — ALBUTEROL SULFATE HFA 108 (90 BASE) MCG/ACT IN AERS
2.0000 | INHALATION_SPRAY | Freq: Four times a day (QID) | RESPIRATORY_TRACT | 1 refills | Status: DC | PRN
  Filled 2021-04-10: qty 18, 25d supply, fill #0

## 2021-04-13 ENCOUNTER — Other Ambulatory Visit (HOSPITAL_COMMUNITY): Payer: Self-pay

## 2022-03-01 ENCOUNTER — Ambulatory Visit (INDEPENDENT_AMBULATORY_CARE_PROVIDER_SITE_OTHER): Payer: Self-pay | Admitting: Internal Medicine

## 2022-03-01 ENCOUNTER — Encounter: Payer: Self-pay | Admitting: Internal Medicine

## 2022-03-01 VITALS — BP 124/80 | HR 86 | Temp 98.2°F | Wt >= 6400 oz

## 2022-03-01 DIAGNOSIS — L0292 Furuncle, unspecified: Secondary | ICD-10-CM

## 2022-03-01 DIAGNOSIS — L0293 Carbuncle, unspecified: Secondary | ICD-10-CM

## 2022-03-01 DIAGNOSIS — I10 Essential (primary) hypertension: Secondary | ICD-10-CM

## 2022-03-01 DIAGNOSIS — L02215 Cutaneous abscess of perineum: Secondary | ICD-10-CM

## 2022-03-01 DIAGNOSIS — E1165 Type 2 diabetes mellitus with hyperglycemia: Secondary | ICD-10-CM

## 2022-03-01 LAB — POCT GLYCOSYLATED HEMOGLOBIN (HGB A1C): Hemoglobin A1C: 7.8 % — AB (ref 4.0–5.6)

## 2022-03-01 MED ORDER — RYBELSUS 3 MG PO TABS: 3.0000 mg | ORAL_TABLET | Freq: Every day | ORAL | 0 refills | Status: DC

## 2022-03-01 MED ORDER — CLINDAMYCIN HCL 300 MG PO CAPS: 300.0000 mg | ORAL_CAPSULE | Freq: Three times a day (TID) | ORAL | 0 refills | Status: DC

## 2022-03-01 MED ORDER — RYBELSUS 7 MG PO TABS: 7.0000 mg | ORAL_TABLET | Freq: Every day | ORAL | 11 refills | Status: DC

## 2022-03-01 MED ORDER — ALBUTEROL SULFATE HFA 108 (90 BASE) MCG/ACT IN AERS: 2.0000 | INHALATION_SPRAY | Freq: Four times a day (QID) | RESPIRATORY_TRACT | 5 refills | Status: DC | PRN

## 2022-03-01 MED ORDER — MUPIROCIN 2 % EX OINT: 1.0000 | TOPICAL_OINTMENT | Freq: Two times a day (BID) | CUTANEOUS | 0 refills | Status: AC

## 2022-03-01 NOTE — Progress Notes (Signed)
Patient ID: Larry Fowler, male   DOB: 11/16/1970, 51 y.o.   MRN: [id number]        Chief Complaint: follow up DM, abscess and recurrent boils       HPI:  Larry Fowler is a 51 y.o. male here after lost to f/u since July 2021; here with recurrent small boils to the left inner thigh, and now one moderate sized perineum abscess x 4 days, drained last PM and pain much improved.  No fever, chills, Denies urinary symptoms such as dysuria, frequency, urgency, flank pain, hematuria or n/v, fever, chills.   Pt denies polydipsia, polyuria, or new focal neuro s/s.   Asking for tx for sugar and obesity.        Wt Readings from Last 3 Encounters:  03/01/22 (!) 468 lb (212.3 kg)  04/19/19 (!) 520 lb (235.9 kg)  11/13/18 (!) 487 lb (220.9 kg)   BP Readings from Last 3 Encounters:  03/01/22 124/80  12/28/19 (!) 140/80  04/19/19 123/77         Past Medical History:  Diagnosis Date   ALLERGIC RHINITIS 09/12/2007   Qualifier: Diagnosis of  By: Jenny Reichmann MD, Hunt Oris    ANXIETY 02/16/2007   Qualifier: Diagnosis of  By: Larose Kells     ASTHMA 12/17/2009   Qualifier: Diagnosis of  By: Jenny Reichmann MD, Hunt Oris    Cannabis abuse    currently   Chills with fever    DEPRESSION 09/12/2007   Qualifier: Diagnosis of  By: Jenny Reichmann MD, Hunt Oris    History of cocaine use    in his 20's   Hyperglycemia    Rectal fistula 2009   Rectal pain    Renal stone    2008   Past Surgical History:  Procedure Laterality Date   FINGER SURGERY  2008/09?   partial amputation of 3rd finger right hand, and reattachment of right index finger - Dr Sharol Given   knee surgury     ? which knee per Dr Noemi Chapel in his teens   REPAIR QUADRICEPS/HAMSTRING MUSCLES Bilateral 07/21/2018   Procedure: REPAIR BILATERAL QUADRICEPS;  Surgeon: Leandrew Koyanagi, MD;  Location: [facility];  Service: Orthopedics;  Laterality: Bilateral;   TONSILLECTOMY      reports that he quit smoking about 11 years ago. His smoking use included cigarettes. He smoked an average of  .25 packs per day. He has never used smokeless tobacco. He reports current alcohol use. He reports current drug use. Drug: Marijuana. family history includes Diabetes type II in his mother; Pneumonia in his father. Allergies  Allergen Reactions   Metformin And Related Nausea Only   Current Outpatient Medications on File Prior to Visit  Medication Sig Dispense Refill   AMBULATORY NON FORMULARY MEDICATION Diltiazem gel 2% mixed with Lidocaine 5%  Apply a pea size amount inside rectum three times a day 30 g 3   betamethasone dipropionate 0.05 % cream Apply topically 2 (two) times daily. 45 g 0   Blood Glucose Monitoring Suppl (ONE TOUCH ULTRA 2) w/Device KIT Use as directed once daily E11.9 1 each 0   clotrimazole-betamethasone (LOTRISONE) cream Apply 1 application topically 2 (two) times daily. 45 g 2   glucose blood (ONE TOUCH ULTRA TEST) test strip Use as instructed once daily E11.9 100 each 12   Hydrocortisone (GERHARDT'S BUTT CREAM) CREA Apply 1 application topically 2 (two) times daily as needed for irritation. 1 each 5   Lancets MISC Use as directed once daily  E11.9 100 each 12   loratadine (CLARITIN) 10 MG tablet Take 1 tablet (10 mg total) by mouth daily. 30 tablet 0   naproxen (NAPROSYN) 500 MG tablet Take 1 tablet (500 mg total) by mouth 2 (two) times daily with a meal. Take 500 mg by mouth 2 (two) times daily with a meal as needed 90 tablet 2   oxyCODONE (OXY IR/ROXICODONE) 5 MG immediate release tablet Take 1 tablet (5 mg total) by mouth 2 (two) times daily as needed for severe pain. 60 tablet 0   silver sulfADIAZINE (SILVADENE) 1 % cream Apply 1 application topically daily. 50 g 0   SYMBICORT 80-4.5 MCG/ACT inhaler INHALE 2 PUFFS INTO THE LUNGS 2 TIMES DAILY. 10.2 g 6   Vitamin D, Ergocalciferol, (DRISDOL) 1.25 MG (50000 UNIT) CAPS capsule Take 1 capsule (50,000 Units total) by mouth every 7 (seven) days. 12 capsule 0   No current facility-administered medications on file prior to  visit.        ROS:  All others reviewed and negative.  Objective        PE:  BP 124/80 (BP Location: Left Arm, Patient Position: Sitting, Cuff Size: Normal)   Pulse 86   Temp 98.2 F (36.8 C) (Oral)   Wt (!) 468 lb (212.3 kg)   SpO2 93%   BMI 58.50 kg/m                 Constitutional: Pt appears in NAD               HENT: Head: NCAT.                Right Ear: External ear normal.                 Left Ear: External ear normal.                Eyes: . Pupils are equal, round, and reactive to light. Conjunctivae and EOM are normal               Nose: without d/c or deformity               Neck: Neck supple. Gross normal ROM               Cardiovascular: Normal rate and regular rhythm.                 Pulmonary/Chest: Effort normal and breath sounds without rales or wheezing.                Abd:  Soft, NT, ND, + BS, no organomegaly               Neurological: Pt is alert. At baseline orientation, motor grossly intact               Skin: Skin is warm. No rashes, no other new lesions, LE edema - trace bilateral, drained perineal absess noted without scrotal redness, swelling; several small boils to left inner thigh as well               Psychiatric: Pt behavior is normal without agitation   Micro: none  Cardiac tracings I have personally interpreted today:  none  Pertinent Radiological findings (summarize): none   Lab Results  Component Value Date   WBC 8.6 12/28/2019   HGB 14.3 12/28/2019   HCT 43.1 12/28/2019   PLT 166 12/28/2019   GLUCOSE 241 (H) 12/26/2018   CHOL 179 12/28/2019  TRIG 123 12/28/2019   HDL 48 12/28/2019   LDLCALC 108 (H) 12/28/2019   ALT 26 12/28/2019   AST 26 12/28/2019   NA 137 12/26/2018   K 4.1 12/26/2018   CL 101 12/26/2018   CREATININE 0.58 12/26/2018   BUN 15 12/26/2018   CO2 25 12/26/2018   TSH 2.62 12/28/2019   PSA <0.1 12/28/2019   HGBA1C 7.8 (A) 03/01/2022   MICROALBUR 1.6 12/26/2018   Hemoglobin A1C 4.0 - 5.6 % 7.8 Abnormal   8.8  High  R, CM  8.8 High   Assessment: Larry Fowler is a 51 y.o. White or Caucasian [1] male with  has a past medical history of ALLERGIC RHINITIS (09/12/2007), ANXIETY (02/16/2007), ASTHMA (12/17/2009), Cannabis abuse, Chills with fever, DEPRESSION (09/12/2007), History of cocaine use, Hyperglycemia, Rectal fistula (2009), Rectal pain, and Renal stone.  Diabetes ([place]) Lab Results  Component Value Date   HGBA1C 7.8 (A) 03/01/2022   Uncontrolled, goal a1c < 7, pt to start rybelsus 3 mg x 1 mo, then 7 mg after   HTN (hypertension) Stable for now, declines low dose ARB for now  Recurrent boils Also for mupirocin nasal ointment as cant r/o MRSA  Perineal abscess, superficial Mild to mod, for cleocin asd, to f/u any worsening symptoms or concerns  Followup: Return in about 6 months (around 08/30/2022).  Cathlean Cower, MD 03/01/2022 7:04 PM [facility]

## 2022-03-01 NOTE — Assessment & Plan Note (Signed)
Also for mupirocin nasal ointment as cant r/o MRSA

## 2022-03-01 NOTE — Assessment & Plan Note (Signed)
Stable for now, declines low dose ARB for now

## 2022-03-01 NOTE — Assessment & Plan Note (Signed)
Lab Results  Component Value Date   HGBA1C 7.8 (A) 03/01/2022   Uncontrolled, goal a1c < 7, pt to start rybelsus 3 mg x 1 mo, then 7 mg after

## 2022-03-01 NOTE — Assessment & Plan Note (Signed)
Mild to mod, for cleocin asd, to f/u any worsening symptoms or concerns

## 2022-03-01 NOTE — Patient Instructions (Signed)
Please take all new medication as prescribed - the antibiotic x 2, and the rybelsus  Your A1c was done today  Please continue all other medications as before, and refills have been done if requested.  Please have the pharmacy call with any other refills you may need.  Please continue your efforts at being more active, low cholesterol diet, and weight control.  Please keep your appointments with your specialists as you may have planned  Please make an Appointment to return in 6 months, or sooner if needed

## 2022-03-03 ENCOUNTER — Encounter: Payer: Self-pay | Admitting: Internal Medicine

## 2022-03-03 MED ORDER — PIOGLITAZONE HCL 15 MG PO TABS: 15.0000 mg | ORAL_TABLET | Freq: Every day | ORAL | 3 refills | Status: DC

## 2022-04-19 ENCOUNTER — Other Ambulatory Visit (HOSPITAL_COMMUNITY): Payer: Self-pay

## 2022-05-07 ENCOUNTER — Encounter: Payer: Self-pay | Admitting: Internal Medicine

## 2022-06-26 ENCOUNTER — Encounter (HOSPITAL_COMMUNITY): Payer: Self-pay | Admitting: Emergency Medicine

## 2022-06-26 ENCOUNTER — Inpatient Hospital Stay (HOSPITAL_COMMUNITY)
Admission: EM | Admit: 2022-06-26 | Discharge: 2022-06-29 | DRG: 299 | Disposition: A | Discharge status: Home / Self Care | Payer: Self-pay | Attending: Internal Medicine | Admitting: Internal Medicine

## 2022-06-26 ENCOUNTER — Emergency Department (HOSPITAL_COMMUNITY): Payer: Self-pay

## 2022-06-26 ENCOUNTER — Other Ambulatory Visit: Payer: Self-pay

## 2022-06-26 DIAGNOSIS — I82451 Acute embolism and thrombosis of right peroneal vein: Secondary | ICD-10-CM | POA: Diagnosis present

## 2022-06-26 DIAGNOSIS — I10 Essential (primary) hypertension: Secondary | ICD-10-CM | POA: Diagnosis present

## 2022-06-26 DIAGNOSIS — D6959 Other secondary thrombocytopenia: Secondary | ICD-10-CM | POA: Diagnosis present

## 2022-06-26 DIAGNOSIS — M7989 Other specified soft tissue disorders: Secondary | ICD-10-CM

## 2022-06-26 DIAGNOSIS — R0602 Shortness of breath: Secondary | ICD-10-CM

## 2022-06-26 DIAGNOSIS — I82431 Acute embolism and thrombosis of right popliteal vein: Secondary | ICD-10-CM | POA: Diagnosis present

## 2022-06-26 DIAGNOSIS — Z6841 Body Mass Index (BMI) 40.0 and over, adult: Secondary | ICD-10-CM

## 2022-06-26 DIAGNOSIS — L304 Erythema intertrigo: Secondary | ICD-10-CM | POA: Diagnosis present

## 2022-06-26 DIAGNOSIS — J4489 Other specified chronic obstructive pulmonary disease: Secondary | ICD-10-CM | POA: Diagnosis present

## 2022-06-26 DIAGNOSIS — Z833 Family history of diabetes mellitus: Secondary | ICD-10-CM

## 2022-06-26 DIAGNOSIS — Z79899 Other long term (current) drug therapy: Secondary | ICD-10-CM

## 2022-06-26 DIAGNOSIS — E119 Type 2 diabetes mellitus without complications: Secondary | ICD-10-CM

## 2022-06-26 DIAGNOSIS — I82411 Acute embolism and thrombosis of right femoral vein: Principal | ICD-10-CM | POA: Diagnosis present

## 2022-06-26 DIAGNOSIS — Z888 Allergy status to other drugs, medicaments and biological substances status: Secondary | ICD-10-CM

## 2022-06-26 DIAGNOSIS — Z7982 Long term (current) use of aspirin: Secondary | ICD-10-CM

## 2022-06-26 DIAGNOSIS — I82409 Acute embolism and thrombosis of unspecified deep veins of unspecified lower extremity: Secondary | ICD-10-CM | POA: Diagnosis present

## 2022-06-26 DIAGNOSIS — F419 Anxiety disorder, unspecified: Secondary | ICD-10-CM | POA: Diagnosis present

## 2022-06-26 DIAGNOSIS — M79609 Pain in unspecified limb: Secondary | ICD-10-CM

## 2022-06-26 DIAGNOSIS — I2692 Saddle embolus of pulmonary artery without acute cor pulmonale: Secondary | ICD-10-CM | POA: Diagnosis present

## 2022-06-26 DIAGNOSIS — I82441 Acute embolism and thrombosis of right tibial vein: Secondary | ICD-10-CM | POA: Diagnosis present

## 2022-06-26 DIAGNOSIS — F32A Depression, unspecified: Secondary | ICD-10-CM | POA: Diagnosis present

## 2022-06-26 DIAGNOSIS — G4733 Obstructive sleep apnea (adult) (pediatric): Secondary | ICD-10-CM | POA: Diagnosis present

## 2022-06-26 DIAGNOSIS — I824Y1 Acute embolism and thrombosis of unspecified deep veins of right proximal lower extremity: Secondary | ICD-10-CM

## 2022-06-26 DIAGNOSIS — F17200 Nicotine dependence, unspecified, uncomplicated: Secondary | ICD-10-CM | POA: Diagnosis present

## 2022-06-26 DIAGNOSIS — E1165 Type 2 diabetes mellitus with hyperglycemia: Secondary | ICD-10-CM | POA: Diagnosis present

## 2022-06-26 LAB — BASIC METABOLIC PANEL
Anion gap: 13 (ref 5–15)
BUN: 10 mg/dL (ref 6–20)
CO2: 23 mmol/L (ref 22–32)
Calcium: 9.3 mg/dL (ref 8.9–10.3)
Chloride: 99 mmol/L (ref 98–111)
Creatinine, Ser: 0.66 mg/dL (ref 0.61–1.24)
GFR, Estimated: >60 mL/min (ref >60)
Glucose, Bld: 243 mg/dL — ABNORMAL HIGH (ref 70–99)
Potassium: 3.9 mmol/L (ref 3.5–5.1)
Sodium: 135 mmol/L (ref 135–145)

## 2022-06-26 LAB — CBC WITH DIFFERENTIAL/PLATELET
Abs Immature Granulocytes: 0.07 10*3/uL (ref 0.00–0.07)
Basophils Absolute: 0.1 10*3/uL (ref 0.0–0.1)
Basophils Relative: 0 %
Eosinophils Absolute: 0.2 10*3/uL (ref 0.0–0.5)
Eosinophils Relative: 2 %
HCT: 45.8 % (ref 39.0–52.0)
Hemoglobin: 15.7 g/dL (ref 13.0–17.0)
Immature Granulocytes: 1 %
Lymphocytes Relative: 14 %
Lymphs Abs: 1.9 10*3/uL (ref 0.7–4.0)
MCH: 32.1 pg (ref 26.0–34.0)
MCHC: 34.3 g/dL (ref 30.0–36.0)
MCV: 93.7 fL (ref 80.0–100.0)
Monocytes Absolute: 0.8 10*3/uL (ref 0.1–1.0)
Monocytes Relative: 6 %
Neutro Abs: 10.2 10*3/uL — ABNORMAL HIGH (ref 1.7–7.7)
Neutrophils Relative %: 77 %
Platelets: 147 10*3/uL — ABNORMAL LOW (ref 150–400)
RBC: 4.89 MIL/uL (ref 4.22–5.81)
RDW: 13.6 % (ref 11.5–15.5)
WBC: 13.2 10*3/uL — ABNORMAL HIGH (ref 4.0–10.5)
nRBC: 0 % (ref 0.0–0.2)

## 2022-06-26 LAB — TROPONIN I (HIGH SENSITIVITY)
Troponin I (High Sensitivity): 13 ng/L (ref <18)
Troponin I (High Sensitivity): 12 ng/L (ref <18)

## 2022-06-26 LAB — BRAIN NATRIURETIC PEPTIDE: B Natriuretic Peptide: 141.3 pg/mL — ABNORMAL HIGH (ref 0.0–100.0)

## 2022-06-26 LAB — CBG MONITORING, ED: Glucose-Capillary: 243 mg/dL — ABNORMAL HIGH (ref 70–99)

## 2022-06-26 MED ORDER — MELATONIN 3 MG PO TABS
3.0000 mg | ORAL_TABLET | Freq: Every evening | ORAL | Status: DC | PRN
  Administered 2022-06-27 – 2022-06-28 (×2): 3 mg via ORAL
  Filled 2022-06-26 (×2): qty 1

## 2022-06-26 MED ORDER — ONDANSETRON HCL 4 MG PO TABS
4.0000 mg | ORAL_TABLET | Freq: Four times a day (QID) | ORAL | Status: DC | PRN
  Administered 2022-06-27: 4 mg via ORAL
  Filled 2022-06-26: qty 1

## 2022-06-26 MED ORDER — ONDANSETRON HCL 4 MG/2ML IJ SOLN
4.0000 mg | Freq: Four times a day (QID) | INTRAMUSCULAR | Status: DC | PRN
  Administered 2022-06-27: 4 mg via INTRAVENOUS
  Filled 2022-06-26: qty 2

## 2022-06-26 MED ORDER — INSULIN ASPART 100 UNIT/ML IJ SOLN
0.0000 [IU] | Freq: Every day | INTRAMUSCULAR | Status: DC
  Administered 2022-06-27: 2 [IU] via SUBCUTANEOUS

## 2022-06-26 MED ORDER — HEPARIN (PORCINE) 25000 UT/250ML-% IV SOLN
2800.0000 [IU]/h | INTRAVENOUS | Status: AC
  Administered 2022-06-26: 2300 [IU]/h via INTRAVENOUS
  Administered 2022-06-27: 2800 [IU]/h via INTRAVENOUS
  Filled 2022-06-26 (×4): qty 250

## 2022-06-26 MED ORDER — INSULIN ASPART 100 UNIT/ML IJ SOLN
0.0000 [IU] | Freq: Three times a day (TID) | INTRAMUSCULAR | Status: DC
  Administered 2022-06-27: 8 [IU] via SUBCUTANEOUS
  Administered 2022-06-27: 5 [IU] via SUBCUTANEOUS
  Administered 2022-06-27: 3 [IU] via SUBCUTANEOUS
  Administered 2022-06-28: 5 [IU] via SUBCUTANEOUS

## 2022-06-26 MED ORDER — HEPARIN BOLUS VIA INFUSION
7500.0000 [IU] | Freq: Once | INTRAVENOUS | Status: AC
  Administered 2022-06-26: 7500 [IU] via INTRAVENOUS
  Filled 2022-06-26: qty 7500

## 2022-06-26 MED ORDER — IOHEXOL 350 MG/ML SOLN
125.0000 mL | Freq: Once | INTRAVENOUS | Status: AC | PRN
  Administered 2022-06-26: 125 mL via INTRAVENOUS

## 2022-06-26 MED ORDER — HYDROMORPHONE HCL 1 MG/ML IJ SOLN
1.0000 mg | Freq: Once | INTRAMUSCULAR | Status: AC
  Administered 2022-06-26: 1 mg via INTRAVENOUS
  Filled 2022-06-26: qty 1

## 2022-06-26 MED ORDER — DIPHENHYDRAMINE HCL 25 MG PO CAPS
25.0000 mg | ORAL_CAPSULE | Freq: Four times a day (QID) | ORAL | Status: DC | PRN
  Administered 2022-06-27 – 2022-06-28 (×2): 25 mg via ORAL
  Filled 2022-06-26 (×2): qty 1

## 2022-06-26 MED ORDER — ACETAMINOPHEN 650 MG RE SUPP: 650.0000 mg | Freq: Four times a day (QID) | RECTAL | Status: DC | PRN

## 2022-06-26 MED ORDER — MOMETASONE FURO-FORMOTEROL FUM 100-5 MCG/ACT IN AERO
2.0000 | INHALATION_SPRAY | Freq: Two times a day (BID) | RESPIRATORY_TRACT | Status: DC
  Administered 2022-06-27 – 2022-06-29 (×6): 2 via RESPIRATORY_TRACT
  Filled 2022-06-26: qty 8.8

## 2022-06-26 MED ORDER — MORPHINE SULFATE (PF) 2 MG/ML IV SOLN
2.0000 mg | INTRAVENOUS | Status: DC | PRN
  Administered 2022-06-27 – 2022-06-29 (×8): 2 mg via INTRAVENOUS
  Filled 2022-06-26 (×8): qty 1

## 2022-06-26 MED ORDER — ALBUTEROL SULFATE (2.5 MG/3ML) 0.083% IN NEBU: 2.5000 mg | INHALATION_SOLUTION | RESPIRATORY_TRACT | Status: DC | PRN

## 2022-06-26 MED ORDER — ACETAMINOPHEN 325 MG PO TABS
650.0000 mg | ORAL_TABLET | Freq: Four times a day (QID) | ORAL | Status: DC | PRN
  Administered 2022-06-27: 650 mg via ORAL
  Filled 2022-06-26: qty 2

## 2022-06-26 MED ORDER — SENNOSIDES-DOCUSATE SODIUM 8.6-50 MG PO TABS: 1.0000 | ORAL_TABLET | Freq: Every evening | ORAL | Status: DC | PRN

## 2022-06-26 MED ORDER — HYDRALAZINE HCL 20 MG/ML IJ SOLN: 5.0000 mg | INTRAMUSCULAR | Status: DC | PRN

## 2022-06-26 NOTE — ED Triage Notes (Signed)
Pt reports SHOB that started this week. Pt states it felt like a panic attack. Pt reports getting flush and diaphoretic. Pt also c/o right knee pain. Denies trauma.

## 2022-06-26 NOTE — Progress Notes (Signed)
ANTICOAGULATION CONSULT NOTE - Initial Consult  Pharmacy Consult for Heparin Indication: pulmonary embolus  Allergies  Allergen Reactions   Metformin And Related Diarrhea and Nausea Only    Patient Measurements:   Heparin Dosing Weight: 136.1 kg  Vital Signs: Temp: 98.1 F (36.7 C) (01/20 1559) Temp Source: Oral (01/20 1559) BP: 169/95 (01/20 1815) Pulse Rate: 82 (01/20 1815)  Labs: Recent Labs    06/26/22 1559 06/26/22 1748  HGB 15.7  --   HCT 45.8  --   PLT 147*  --   CREATININE 0.66  --   TROPONINIHS 13 12    CrCl cannot be calculated (Unknown ideal weight.).   Medical History: Past Medical History:  Diagnosis Date   ALLERGIC RHINITIS 09/12/2007   Qualifier: Diagnosis of  By: Jenny Reichmann MD, Hunt Oris    ANXIETY 02/16/2007   Qualifier: Diagnosis of  By: Reatha Armour, Lucy     ASTHMA 12/17/2009   Qualifier: Diagnosis of  By: Jenny Reichmann MD, Hunt Oris    Cannabis abuse    currently   Chills with fever    DEPRESSION 09/12/2007   Qualifier: Diagnosis of  By: Jenny Reichmann MD, Hunt Oris    History of cocaine use    in his 20's   Hyperglycemia    Rectal fistula 2009   Rectal pain    Renal stone    2008    Medications:  (Not in a hospital admission)  Scheduled:  Infusions:  PRN:   Assessment: 10 yom presenting with SOB. Heparin per pharmacy consult placed for pulmonary embolus.  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  Hgb 15.7; plt 147  Goal of Therapy:  Heparin level 0.3-0.7 units/ml Monitor platelets by anticoagulation protocol: Yes   Plan:  Give IV heparin 7500 units bolus x 1 Start heparin infusion at 2300 units/hr Check anti-Xa level in 6 hours and daily while on heparin Continue to monitor H&H and platelets  Lorelei Pont, PharmD, BCPS 06/26/2022 7:47 PM ED Clinical Pharmacist -  [phone]

## 2022-06-26 NOTE — ED Notes (Signed)
Patient transported to CT 

## 2022-06-26 NOTE — ED Notes (Signed)
Found pt sitting on side of bed wanting to get changed into a gown.  RN assisted and provided non-slip socks as well   Admitting provider came in .

## 2022-06-26 NOTE — H&P (Signed)
PCP:   [name], MD   Chief Complaint:  Shortness of breath  HPI: This is a 52 year old male with past medical history of extreme morbid obesity, obstructive sleep apnea, hypoglycemia.  Patient presents with shortness of breath.  Monday night he went to bed, he is also short of breath and found he could not catch his breath.  He felt flushed.  He used his albuterol MDI and the symptoms quickly resolved.  The following night the same recurred.  This time he was quite short of breath, and became quite diaphoretic.  He again uses albuterol MDI, the symptoms resolved but this time it took longer 5 to 10 minutes.  The next morning he quit smoking.  He does not report chest pains.  Yesterday his right knee started hurting.  His thigh felt a cramp that wrapped all around.  He called [facility], they redirected him to the ER.  The patient denies any recent travel.  He has been quite sedentary for the last 5 to 6 months.  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    He has a recurrent rash in his right inner upper thigh, right below his pannus.  He has slight cracking on the right pannus.  He states his itches.  He has seen dermatology and was prescribed Benadryl which works but the rash returns.  He states the [employer] butt cream works.  His PCP has prescribed steroids and antifungal cream which does not work.  He states the rash has been present for most 3 years and he would like some assistance with it.   In the ER CTA chest is positive for a saddle pulmonary embolus with lobar and segmental embolisms.  RV strain.  His Doppler study showed extensive right-sided DVT.  He is on room air.  Review of Systems:  The patient denies anorexia, fever, weight loss,, vision loss, decreased hearing, hoarseness, chest pain, syncope, peripheral edema, balance deficits, hemoptysis, abdominal pain, melena, hematochezia, severe indigestion/heartburn, hematuria, incontinence,  genital sores, muscle weakness, suspicious skin lesions, transient blindness, difficulty walking, depression, unusual weight change, abnormal bleeding, enlarged lymph nodes, angioedema, and breast masses. Positives: Shortness of breath, diaphoresis,  Past Medical History: Past Medical History:  Diagnosis Date   ALLERGIC RHINITIS 09/12/2007   Qualifier: Diagnosis of  By: Jenny Reichmann MD, Hunt Oris    ANXIETY 02/16/2007   Qualifier: Diagnosis of  By: Reatha Armour, Lucy     ASTHMA 12/17/2009   Qualifier: Diagnosis of  By: Jenny Reichmann MD, Hunt Oris    Cannabis abuse    currently   Chills with fever    DEPRESSION 09/12/2007   Qualifier: Diagnosis of  By: Jenny Reichmann MD, Hunt Oris    History of cocaine use    in his 20's   Hyperglycemia    Rectal fistula 2009   Rectal pain    Renal stone    2008   Past Surgical History:  Procedure Laterality Date   FINGER SURGERY  2008/09?   partial amputation of 3rd finger right hand, and reattachment of right index finger - Dr Sharol Given   knee surgury     ? which knee per Dr Noemi Chapel in his teens   REPAIR QUADRICEPS/HAMSTRING MUSCLES Bilateral 07/21/2018   Procedure: REPAIR BILATERAL QUADRICEPS;  Surgeon: Leandrew Koyanagi, MD;  Location: [facility];  Service: Orthopedics;  Laterality: Bilateral;   TONSILLECTOMY      Medications: Prior to Admission medications   Medication Sig Start  Date End Date Taking? Authorizing Provider  albuterol (VENTOLIN HFA) 108 (90 Base) MCG/ACT inhaler Inhale 2 puffs into the lungs every 6 (six) hours as needed for wheezing. 03/01/22  Yes [name], MD  aspirin EC 81 MG tablet Take 81 mg by mouth once as needed (chest pain).   Yes [provider]  Bacitracin-Polymyxin B (NEOSPORIN EX) Apply 1 application  topically daily as needed (irritation of perineum).   Yes [provider]  diphenhydrAMINE HCl (BENADRYL MAXIMUM STRENGTH EX) Apply 1 application  topically 2 (two) times daily as needed (irritation of perineum).   Yes [provider]   pioglitazone (ACTOS) 15 MG tablet Take 1 tablet (15 mg total) by mouth daily. 03/03/22  Yes [name], MD  SYMBICORT 80-4.5 MCG/ACT inhaler INHALE 2 PUFFS INTO THE LUNGS 2 TIMES DAILY. Patient taking differently: Inhale 1 puff into the lungs daily as needed (wheezing, shortness of breath). 05/27/20  Yes Coral Ceo, NP  clindamycin (CLEOCIN) 300 MG capsule Take 1 capsule (300 mg total) by mouth 3 (three) times daily. Patient not taking: Reported on 06/26/2022 03/01/22   [name], MD    Allergies:   Allergies  Allergen Reactions   Metformin And Related Diarrhea and Nausea Only    Social History:  reports that he quit smoking about 11 years ago. His smoking use included cigarettes. He smoked an average of .25 packs per day. He has never used smokeless tobacco. He reports current alcohol use. He reports current drug use. Drug: Marijuana.  Family History: Family History  Problem Relation Age of Onset   Diabetes type II Mother    Pneumonia Father     Physical Exam: Vitals:   06/26/22 1949 06/26/22 1950 06/26/22 1952 06/26/22 1956  BP: (!) 159/94     Pulse: 83 79    Resp: 18 18    Temp:    98.7 F (37.1 C)  TempSrc:    Oral  SpO2: 99% 99%    Weight:   (!) 207.3 kg   Height:   6\' 3"  (1.905 m)     General:  Alert and oriented times three, extremely morbidly obese, no acute distress Eyes: PERRLA, pink conjunctiva, no scleral icterus ENT: Moist oral mucosa, neck supple, no thyromegaly Lungs: clear to ascultation, no wheeze, no crackles, no use of accessory muscles Cardiovascular: regular rate and rhythm, no regurgitation, no gallops, no murmurs. No carotid bruits, no JVD Abdomen: soft, positive BS, non-tender, non-distended, no organomegaly, not an acute abdomen GU: not examined Neuro: CN II - XII grossly intact, sensation intact Musculoskeletal: strength 5/5 all extremities, no clubbing, cyanosis or edema, right calf larger than left Skin: no rash, no subcutaneous  crepitation, no decubitus Psych: appropriate patient  Labs on Admission:  Recent Labs    06/26/22 1559  NA 135  K 3.9  CL 99  CO2 23  GLUCOSE 243*  BUN 10  CREATININE 0.66  CALCIUM 9.3    Recent Labs    06/26/22 1559  WBC 13.2*  NEUTROABS 10.2*  HGB 15.7  HCT 45.8  MCV 93.7  PLT 147*     Radiological Exams on Admission: CT VENOGRAM ABDOMEN PELVIS  Addendum Date: 06/26/2022   ADDENDUM REPORT: 06/26/2022 19:44 ADDENDUM: Comparison is made to same day ultrasound examination of the bilateral lower extremities performed by vascular lab; reported deep venous thrombus of the right lower extremity is not well appreciated by CT venogram. Ultrasound is generally the test of choice for the  evaluation of lower extremity deep venous thrombosis. Electronically Signed   By: Delanna Ahmadi M.D.   On: 06/26/2022 19:44   Result Date: 06/26/2022 CLINICAL DATA:  Deep venous thrombosis EXAM: CT VENOGRAM ABDOMEN AND PELVIS TECHNIQUE: CT venographic examination of the abdomen, pelvis, and lower extremities was performed following the administration of intravenous contrast. RADIATION DOSE REDUCTION: This exam was performed according to the departmental dose-optimization program which includes automated exposure control, adjustment of the mA and/or kV according to patient size and/or use of iterative reconstruction technique. CONTRAST:  153mL OMNIPAQUE IOHEXOL 350 MG/ML SOLN COMPARISON:  None Available. FINDINGS: CT VENOGRAM Normal appearance of the inferior vena cava, bilateral iliac vein systems, and lower extremity veins, imaged through the lower calves. No evidence of thrombus, occlusion, or other abnormality. Superficial varicosities about the lower extremities. CT ABDOMEN, PELVIS, AND LOWER EXTREMITIES Lower chest: Please see separately reported examination of the chest. Hepatobiliary: No solid liver abnormality is seen. Hepatomegaly, maximum coronal span 22.9 cm. Hepatic steatosis. No gallstones,  gallbladder wall thickening, or biliary dilatation. Pancreas: Unremarkable. No pancreatic ductal dilatation or surrounding inflammatory changes. Spleen: Normal in size without significant abnormality. Adrenals/Urinary Tract: Adrenal glands are unremarkable. Nonobstructive bilateral renal calculi. No hydronephrosis. Bladder is unremarkable. Stomach/Bowel: Stomach is within normal limits. Appendix appears normal. No evidence of bowel wall thickening, distention, or inflammatory changes. Lymphatic: No enlarged abdominal or pelvic lymph nodes. Reproductive: No mass or other significant abnormality. Other: Small, fat containing left inguinal hernia.  No ascites. Musculoskeletal: No acute or significant osseous findings. Lower extremities: Superficial soft tissue edema throughout the lower extremities. Subcutaneous calcification of the calves, likely related to chronic venous stasis. IMPRESSION: 1. Normal appearance of the inferior vena cava, bilateral iliac vein systems, and lower extremity veins, imaged through the lower calves. No evidence of thrombus, occlusion, or other abnormality. 2. Superficial soft tissue edema throughout the lower extremities. Subcutaneous calcification of the calves, likely related to chronic venous stasis. 3. Hepatomegaly and hepatic steatosis. 4. Nonobstructive bilateral renal calculi. Electronically Signed: By: Delanna Ahmadi M.D. On: 06/26/2022 19:35   CT Angio Chest PE W/Cm &/Or Wo Cm  Result Date: 06/26/2022 CLINICAL DATA:  PE suspected EXAM: CT ANGIOGRAPHY CHEST WITH CONTRAST TECHNIQUE: Multidetector CT imaging of the chest was performed using the standard protocol during bolus administration of intravenous contrast. Multiplanar CT image reconstructions and MIPs were obtained to evaluate the vascular anatomy. RADIATION DOSE REDUCTION: This exam was performed according to the departmental dose-optimization program which includes automated exposure control, adjustment of the mA and/or  kV according to patient size and/or use of iterative reconstruction technique. CONTRAST:  144mL OMNIPAQUE IOHEXOL 350 MG/ML SOLN COMPARISON:  None Available. FINDINGS: Cardiovascular: Satisfactory opacification of the pulmonary arteries to the segmental level. Positive examination for pulmonary embolism, with saddle embolus present as well as bilateral lobar and segmental embolus throughout the lungs. Most proximal occlusive embolus is in the distal left pulmonary artery (series 8, image 46). Global cardiomegaly with enlargement of the RV LV ratio to 1.5. The main pulmonary artery measures up to 2.9 cm in caliber. No pericardial effusion. Mediastinum/Nodes: No enlarged mediastinal, hilar, or axillary lymph nodes. Thyroid gland, trachea, and esophagus demonstrate no significant findings. Lungs/Pleura: Lungs are clear. No pleural effusion or pneumothorax. Upper Abdomen: Please see separately reported examination of the abdomen and pelvis. Musculoskeletal: No chest wall abnormality. No acute osseous findings. Review of the MIP images confirms the above findings. IMPRESSION: 1. Positive examination for pulmonary embolism, with saddle embolus present as well  as bilateral lobar and segmental embolus throughout the lungs. Most proximal occlusive embolus is in the distal left pulmonary artery. 2. Global cardiomegaly with enlargement of the RV LV ratio to 1.5. The main pulmonary artery measures up to 2.9 cm in caliber. Findings are consistent with right heart strain. These results were called by telephone at the time of interpretation on 06/26/2022 at 7:24 pm to Dr. Shirlyn Goltz who verbally acknowledged these results. Electronically Signed   By: Delanna Ahmadi M.D.   On: 06/26/2022 19:36   DG Chest 2 View  Result Date: 06/26/2022 CLINICAL DATA:  Shortness of breath, chest pain EXAM: CHEST - 2 VIEW COMPARISON:  02/22/2007 FINDINGS: Enlargement of cardiac silhouette. Mediastinal contours and pulmonary vascularity normal. Lungs  clear. No pulmonary infiltrate, pleural effusion, or pneumothorax. Osseous structures unremarkable. IMPRESSION: Mild enlargement of cardiac silhouette. No acute infiltrate. Electronically Signed   By: Lavonia Dana M.D.   On: 06/26/2022 17:02   VAS [place] LOWER EXTREMITY VENOUS (DVT) (7a-7p)  Result Date: 06/26/2022  Lower Venous DVT Study Patient Name:  Larry Fowler  Date of Exam:   06/26/2022 Medical Rec #: [id number]          Accession #:    [id number] Date of Birth: [birth year]         Patient Gender: M Patient Age:   33 years Exam Location:  [facility] Procedure:      VAS [place] LOWER EXTREMITY VENOUS (DVT) Referring Phys: HALEY SAGE --------------------------------------------------------------------------------  Indications: Right lower extremity pain and swelling with associated shortness of breath. Other Indications: Patient with bilateral brawny discoloration of the calves. Comparison Study: 08-02-2018 Prior bilateral lower extremity venous study was                   negative for DVT. Performing Technologist: Darlin Coco RDMS, RVT  Examination Guidelines: A complete evaluation includes B-mode imaging, spectral Doppler, color Doppler, and power Doppler as needed of all accessible portions of each vessel. Bilateral testing is considered an integral part of a complete examination. Limited examinations for reoccurring indications may be performed as noted. The reflux portion of the exam is performed with the patient in reverse Trendelenburg.  +---------+---------------+---------+-----------+----------+--------------+ RIGHT    CompressibilityPhasicitySpontaneityPropertiesThrombus Aging +---------+---------------+---------+-----------+----------+--------------+ CFV      Partial        Yes      Yes                  Acute          +---------+---------------+---------+-----------+----------+--------------+ SFJ      Full                                                         +---------+---------------+---------+-----------+----------+--------------+ FV Prox  None           No       No                   Acute          +---------+---------------+---------+-----------+----------+--------------+ FV Mid   None           No       No                   Acute          +---------+---------------+---------+-----------+----------+--------------+  FV DistalNone           No       No                   Acute          +---------+---------------+---------+-----------+----------+--------------+ PFV      Full                                                        +---------+---------------+---------+-----------+----------+--------------+ POP      None           No       No                   Acute          +---------+---------------+---------+-----------+----------+--------------+ PTV      None           No       No                   Acute          +---------+---------------+---------+-----------+----------+--------------+ PERO     None           No       No                   Acute          +---------+---------------+---------+-----------+----------+--------------+   +----+---------------+---------+-----------+----------+--------------+ LEFTCompressibilityPhasicitySpontaneityPropertiesThrombus Aging +----+---------------+---------+-----------+----------+--------------+ CFV Full           Yes      Yes                                 +----+---------------+---------+-----------+----------+--------------+     Summary: RIGHT: - Findings consistent with acute deep vein thrombosis involving the right common femoral vein, right femoral vein, right popliteal vein, right posterior tibial veins, and right peroneal veins.  - No cystic structure found in the popliteal fossa.  LEFT: - No evidence of common femoral vein obstruction.  *See table(s) above for measurements and observations.    Preliminary     Assessment/Plan Present on Admission:  Saddle  pulmonary embolus (HCC)  DVT (deep venous thrombosis) ([place]) -Admit to med telemetry -Heparin drip started by EDP, continued -2D echo ordered -DVT/PE likely caused by increased sedentary lifestyle.  Hypercoagulable workup not initiated -EDP contacted CCM, thrombectomy not thought to be required.  Patient on room air, comfortable   Diabetes type 2 -Sliding scale insulin   Tobacco abuse -Nicotine patch ordered   COPD -Continue Symbicort or equivalent -Nebulizers as needed   Extreme morbid obesity ([place])  Deja Kaigler 06/26/2022, 8:48 PM

## 2022-06-26 NOTE — ED Notes (Signed)
Provider bedside.

## 2022-06-26 NOTE — ED Provider Notes (Signed)
[facility] AT [facility] Provider Note   CSN: [id number] Arrival date & time: 06/26/22  1411     History  Chief Complaint  Patient presents with   Shortness of Breath    Larry Fowler is a 52 y.o. male history of asthma, here presenting with shortness of breath.  Patient has not been active for the last 6 months.  He has been very depressed.  He states that he noticed some shortness of breath and diaphoresis for the last 5 days.  He noticed right leg swelling since yesterday.  Denies any fevers or chills.  Had a DVT study done in triage and was noted to have a large DVT and was sent here for CTA chest  The history is provided by the patient.       Home Medications Prior to Admission medications   Medication Sig Start Date End Date Taking? Authorizing Provider  albuterol (VENTOLIN HFA) 108 (90 Base) MCG/ACT inhaler Inhale 2 puffs into the lungs every 6 (six) hours as needed for wheezing. 03/01/22  Yes [name], MD  aspirin EC 81 MG tablet Take 81 mg by mouth once as needed (chest pain).   Yes [provider]  Bacitracin-Polymyxin B (NEOSPORIN EX) Apply 1 application  topically daily as needed (irritation of perineum).   Yes [provider]  diphenhydrAMINE HCl (BENADRYL MAXIMUM STRENGTH EX) Apply 1 application  topically 2 (two) times daily as needed (irritation of perineum).   Yes [provider]  pioglitazone (ACTOS) 15 MG tablet Take 1 tablet (15 mg total) by mouth daily. 03/03/22  Yes [name], MD  SYMBICORT 80-4.5 MCG/ACT inhaler INHALE 2 PUFFS INTO THE LUNGS 2 TIMES DAILY. Patient taking differently: Inhale 1 puff into the lungs daily as needed (wheezing, shortness of breath). 05/27/20  Yes Coral Ceo, NP  clindamycin (CLEOCIN) 300 MG capsule Take 1 capsule (300 mg total) by mouth 3 (three) times daily. Patient not taking: Reported on 06/26/2022 03/01/22   [name], MD      Allergies    Metformin  and related    Review of Systems   Review of Systems  Respiratory:  Positive for shortness of breath.   All other systems reviewed and are negative.   Physical Exam Updated Vital Signs BP (!) 159/94 (BP Location: Right Arm)   Pulse 79   Temp 98.7 F (37.1 C) (Oral)   Resp 18   Ht 6\' 3"  (1.905 m)   Wt (!) 207.3 kg   SpO2 99%   BMI 57.12 kg/m  Physical Exam Vitals and nursing note reviewed.  Constitutional:      Comments: Crying and tearful  HENT:     Head: Normocephalic.  Eyes:     Pupils: Pupils are equal, round, and reactive to light.  Cardiovascular:     Rate and Rhythm: Regular rhythm. Tachycardia present.  Pulmonary:     Effort: Pulmonary effort is normal.     Breath sounds: Normal breath sounds.  Musculoskeletal:     Cervical back: Normal range of motion and neck supple.     Comments: Right leg edema and right calf tenderness compared to the left.  Skin:    General: Skin is warm.     Capillary Refill: Capillary refill takes less than 2 seconds.  Neurological:     General: No focal deficit present.     Mental Status: He is oriented to person, place, and time.  Psychiatric:  Mood and Affect: Mood normal.        Behavior: Behavior normal.     ED Results / Procedures / Treatments   Labs (all labs ordered are listed, but only abnormal results are displayed) Labs Reviewed  BASIC METABOLIC PANEL - Abnormal; Notable for the following components:      Result Value   Glucose, Bld 243 (*)    All other components within normal limits  BRAIN NATRIURETIC PEPTIDE - Abnormal; Notable for the following components:   B Natriuretic Peptide 141.3 (*)    All other components within normal limits  CBC WITH DIFFERENTIAL/PLATELET - Abnormal; Notable for the following components:   WBC 13.2 (*)    Platelets 147 (*)    Neutro Abs 10.2 (*)    All other components within normal limits  HEPARIN LEVEL (UNFRACTIONATED)  TROPONIN I (HIGH SENSITIVITY)  TROPONIN I (HIGH  SENSITIVITY)    EKG None  Radiology CT VENOGRAM ABDOMEN PELVIS  Addendum Date: 06/26/2022   ADDENDUM REPORT: 06/26/2022 19:44 ADDENDUM: Comparison is made to same day ultrasound examination of the bilateral lower extremities performed by vascular lab; reported deep venous thrombus of the right lower extremity is not well appreciated by CT venogram. Ultrasound is generally the test of choice for the evaluation of lower extremity deep venous thrombosis. Electronically Signed   By: Delanna Ahmadi M.D.   On: 06/26/2022 19:44   Result Date: 06/26/2022 CLINICAL DATA:  Deep venous thrombosis EXAM: CT VENOGRAM ABDOMEN AND PELVIS TECHNIQUE: CT venographic examination of the abdomen, pelvis, and lower extremities was performed following the administration of intravenous contrast. RADIATION DOSE REDUCTION: This exam was performed according to the departmental dose-optimization program which includes automated exposure control, adjustment of the mA and/or kV according to patient size and/or use of iterative reconstruction technique. CONTRAST:  151mL OMNIPAQUE IOHEXOL 350 MG/ML SOLN COMPARISON:  None Available. FINDINGS: CT VENOGRAM Normal appearance of the inferior vena cava, bilateral iliac vein systems, and lower extremity veins, imaged through the lower calves. No evidence of thrombus, occlusion, or other abnormality. Superficial varicosities about the lower extremities. CT ABDOMEN, PELVIS, AND LOWER EXTREMITIES Lower chest: Please see separately reported examination of the chest. Hepatobiliary: No solid liver abnormality is seen. Hepatomegaly, maximum coronal span 22.9 cm. Hepatic steatosis. No gallstones, gallbladder wall thickening, or biliary dilatation. Pancreas: Unremarkable. No pancreatic ductal dilatation or surrounding inflammatory changes. Spleen: Normal in size without significant abnormality. Adrenals/Urinary Tract: Adrenal glands are unremarkable. Nonobstructive bilateral renal calculi. No  hydronephrosis. Bladder is unremarkable. Stomach/Bowel: Stomach is within normal limits. Appendix appears normal. No evidence of bowel wall thickening, distention, or inflammatory changes. Lymphatic: No enlarged abdominal or pelvic lymph nodes. Reproductive: No mass or other significant abnormality. Other: Small, fat containing left inguinal hernia.  No ascites. Musculoskeletal: No acute or significant osseous findings. Lower extremities: Superficial soft tissue edema throughout the lower extremities. Subcutaneous calcification of the calves, likely related to chronic venous stasis. IMPRESSION: 1. Normal appearance of the inferior vena cava, bilateral iliac vein systems, and lower extremity veins, imaged through the lower calves. No evidence of thrombus, occlusion, or other abnormality. 2. Superficial soft tissue edema throughout the lower extremities. Subcutaneous calcification of the calves, likely related to chronic venous stasis. 3. Hepatomegaly and hepatic steatosis. 4. Nonobstructive bilateral renal calculi. Electronically Signed: By: Delanna Ahmadi M.D. On: 06/26/2022 19:35   CT Angio Chest PE W/Cm &/Or Wo Cm  Result Date: 06/26/2022 CLINICAL DATA:  PE suspected EXAM: CT ANGIOGRAPHY CHEST WITH CONTRAST TECHNIQUE: Multidetector CT  imaging of the chest was performed using the standard protocol during bolus administration of intravenous contrast. Multiplanar CT image reconstructions and MIPs were obtained to evaluate the vascular anatomy. RADIATION DOSE REDUCTION: This exam was performed according to the departmental dose-optimization program which includes automated exposure control, adjustment of the mA and/or kV according to patient size and/or use of iterative reconstruction technique. CONTRAST:  123mL OMNIPAQUE IOHEXOL 350 MG/ML SOLN COMPARISON:  None Available. FINDINGS: Cardiovascular: Satisfactory opacification of the pulmonary arteries to the segmental level. Positive examination for pulmonary  embolism, with saddle embolus present as well as bilateral lobar and segmental embolus throughout the lungs. Most proximal occlusive embolus is in the distal left pulmonary artery (series 8, image 46). Global cardiomegaly with enlargement of the RV LV ratio to 1.5. The main pulmonary artery measures up to 2.9 cm in caliber. No pericardial effusion. Mediastinum/Nodes: No enlarged mediastinal, hilar, or axillary lymph nodes. Thyroid gland, trachea, and esophagus demonstrate no significant findings. Lungs/Pleura: Lungs are clear. No pleural effusion or pneumothorax. Upper Abdomen: Please see separately reported examination of the abdomen and pelvis. Musculoskeletal: No chest wall abnormality. No acute osseous findings. Review of the MIP images confirms the above findings. IMPRESSION: 1. Positive examination for pulmonary embolism, with saddle embolus present as well as bilateral lobar and segmental embolus throughout the lungs. Most proximal occlusive embolus is in the distal left pulmonary artery. 2. Global cardiomegaly with enlargement of the RV LV ratio to 1.5. The main pulmonary artery measures up to 2.9 cm in caliber. Findings are consistent with right heart strain. These results were called by telephone at the time of interpretation on 06/26/2022 at 7:24 pm to Dr. Shirlyn Goltz who verbally acknowledged these results. Electronically Signed   By: Delanna Ahmadi M.D.   On: 06/26/2022 19:36   DG Chest 2 View  Result Date: 06/26/2022 CLINICAL DATA:  Shortness of breath, chest pain EXAM: CHEST - 2 VIEW COMPARISON:  02/22/2007 FINDINGS: Enlargement of cardiac silhouette. Mediastinal contours and pulmonary vascularity normal. Lungs clear. No pulmonary infiltrate, pleural effusion, or pneumothorax. Osseous structures unremarkable. IMPRESSION: Mild enlargement of cardiac silhouette. No acute infiltrate. Electronically Signed   By: Lavonia Dana M.D.   On: 06/26/2022 17:02   VAS [place] LOWER EXTREMITY VENOUS (DVT)  (7a-7p)  Result Date: 06/26/2022  Lower Venous DVT Study Patient Name:  Larry Fowler  Date of Exam:   06/26/2022 Medical Rec #: [id number]          Accession #:    [id number] Date of Birth: [birth year]         Patient Gender: M Patient Age:   29 years Exam Location:  [facility] Procedure:      VAS [place] LOWER EXTREMITY VENOUS (DVT) Referring Phys: HALEY SAGE --------------------------------------------------------------------------------  Indications: Right lower extremity pain and swelling with associated shortness of breath. Other Indications: Patient with bilateral brawny discoloration of the calves. Comparison Study: 08-02-2018 Prior bilateral lower extremity venous study was                   negative for DVT. Performing Technologist: Darlin Coco RDMS, RVT  Examination Guidelines: A complete evaluation includes B-mode imaging, spectral Doppler, color Doppler, and power Doppler as needed of all accessible portions of each vessel. Bilateral testing is considered an integral part of a complete examination. Limited examinations for reoccurring indications may be performed as noted. The reflux portion of the exam is performed with the patient in reverse Trendelenburg.  +---------+---------------+---------+-----------+----------+--------------+ RIGHT  CompressibilityPhasicitySpontaneityPropertiesThrombus Aging +---------+---------------+---------+-----------+----------+--------------+ CFV      Partial        Yes      Yes                  Acute          +---------+---------------+---------+-----------+----------+--------------+ SFJ      Full                                                        +---------+---------------+---------+-----------+----------+--------------+ FV Prox  None           No       No                   Acute          +---------+---------------+---------+-----------+----------+--------------+ FV Mid   None           No       No                   Acute           +---------+---------------+---------+-----------+----------+--------------+ FV DistalNone           No       No                   Acute          +---------+---------------+---------+-----------+----------+--------------+ PFV      Full                                                        +---------+---------------+---------+-----------+----------+--------------+ POP      None           No       No                   Acute          +---------+---------------+---------+-----------+----------+--------------+ PTV      None           No       No                   Acute          +---------+---------------+---------+-----------+----------+--------------+ PERO     None           No       No                   Acute          +---------+---------------+---------+-----------+----------+--------------+   +----+---------------+---------+-----------+----------+--------------+ LEFTCompressibilityPhasicitySpontaneityPropertiesThrombus Aging +----+---------------+---------+-----------+----------+--------------+ CFV Full           Yes      Yes                                 +----+---------------+---------+-----------+----------+--------------+     Summary: RIGHT: - Findings consistent with acute deep vein thrombosis involving the right common femoral vein, right femoral vein, right popliteal vein, right posterior tibial veins, and right peroneal veins.  - No cystic structure found in the popliteal fossa.  LEFT: - No evidence of  common femoral vein obstruction.  *See table(s) above for measurements and observations.    Preliminary     Procedures Procedures    CRITICAL CARE Performed by: Richardean Canal   Total critical care time: 30 minutes  Critical care time was exclusive of separately billable procedures and treating other patients.  Critical care was necessary to treat or prevent imminent or life-threatening deterioration.  Critical care was time spent  personally by me on the following activities: development of treatment plan with patient and/or surrogate as well as nursing, discussions with consultants, evaluation of patient's response to treatment, examination of patient, obtaining history from patient or surrogate, ordering and performing treatments and interventions, ordering and review of laboratory studies, ordering and review of radiographic studies, pulse oximetry and re-evaluation of patient's condition.   Medications Ordered in ED Medications  HYDROmorphone (DILAUDID) injection 1 mg (has no administration in time range)  heparin ADULT infusion 100 units/mL (25000 units/258mL) (has no administration in time range)  heparin bolus via infusion 7,500 Units (has no administration in time range)  iohexol (OMNIPAQUE) 350 MG/ML injection 125 mL (125 mLs Intravenous Contrast Given 06/26/22 1859)    ED Course/ Medical Decision Making/ A&P                             Medical Decision Making Larry Fowler is a 52 y.o. male here presenting with right leg pain and shortness of breath.  Patient had DVT study done in triage and it showed right common femoral and right femoral vein and popliteal and peroneal vein DVTs.  Concern for possible PE versus May Turner syndrome.  Plan to get CTA chest and CT venogram of the abdomen  8:15 PM CTA chest showed bilateral PE with heart strain.  Patient is not hypoxic or hypotensive.  I discussed with Dr. Jayme Cloud from critical care.  She reviewed the case and felt that patient can be admitted by the hospitalist.  Hospitalist to admit.  Patient started on IV heparin   Problems Addressed: Acute deep vein thrombosis (DVT) of proximal vein of right lower extremity (HCC): acute illness or injury  Amount and/or Complexity of Data Reviewed Labs: ordered. Decision-making details documented in ED Course. Radiology: ordered and independent interpretation performed. Decision-making details documented in ED  Course. ECG/medicine tests: ordered and independent interpretation performed. Decision-making details documented in ED Course.  Risk Prescription drug management.    Final Clinical Impression(s) / ED Diagnoses Final diagnoses:  None    Rx / DC Orders ED Discharge Orders     None         Charlynne Pander, MD 06/26/22 2017

## 2022-06-26 NOTE — Progress Notes (Signed)
Lower extremity venous right study completed.  Preliminary results relayed to [place], [place].   See CV Proc for preliminary results report.   Darlin Coco, RDMS, RVT

## 2022-06-26 NOTE — ED Provider Triage Note (Signed)
Emergency Medicine Provider Triage Evaluation Note  RUSHTON EARLY , a 52 y.o. male  was evaluated in triage.  Pt complains of shortness of breath for the last 4 to 5 days.  He has been in the middle of the night, decided to quit smoking 4 days ago which helped.  No chest pain, does endorse some DOE.  Patient is been having pain behind his right lower extremity for the past few days no history of ACS or PE.Marland Kitchen  No history of DVT or PE, his neighbor who is a physician advised going to the ED for DVT rule out.   Review of Systems  Per HPI.  Physical Exam  BP (!) 163/104   Pulse 85   Resp 20   SpO2 97%  Gen:   Awake, no distress   Resp:  Normal effort  MSK:   Moves extremities without difficulty  Other:  Mild tenderness posterior to the right knee.  Lower extremities are both slightly edematous with venous stasis changes but right leg may be slightly larger compared to the left.  Pulses intact.  Medical Decision Making  Medically screening exam initiated at 3:48 PM.  Appropriate orders placed.  TULIO FACUNDO was informed that the remainder of the evaluation will be completed by another provider, this initial triage assessment does not replace that evaluation, and the importance of remaining in the ED until their evaluation is complete.     Sherrill Raring, PA-C 06/26/22 1549

## 2022-06-27 ENCOUNTER — Inpatient Hospital Stay (HOSPITAL_COMMUNITY): Payer: Self-pay

## 2022-06-27 DIAGNOSIS — I2692 Saddle embolus of pulmonary artery without acute cor pulmonale: Secondary | ICD-10-CM

## 2022-06-27 LAB — CBC
HCT: 41.3 % (ref 39.0–52.0)
Hemoglobin: 14.2 g/dL (ref 13.0–17.0)
MCH: 32.4 pg (ref 26.0–34.0)
MCHC: 34.4 g/dL (ref 30.0–36.0)
MCV: 94.3 fL (ref 80.0–100.0)
Platelets: 129 10*3/uL — ABNORMAL LOW (ref 150–400)
RBC: 4.38 MIL/uL (ref 4.22–5.81)
RDW: 13.7 % (ref 11.5–15.5)
WBC: 12.3 10*3/uL — ABNORMAL HIGH (ref 4.0–10.5)
nRBC: 0 % (ref 0.0–0.2)

## 2022-06-27 LAB — ECHOCARDIOGRAM COMPLETE
AR max vel: 2.91 cm2
AV Area VTI: 2.83 cm2
AV Area mean vel: 2.92 cm2
AV Mean grad: 5 mmHg
AV Peak grad: 8.8 mmHg
Ao pk vel: 1.48 m/s
Area-P 1/2: 3.27 cm2
Calc EF: 64.2 %
Height: 75 in
S' Lateral: 3.8 cm
Single Plane A2C EF: 64.4 %
Single Plane A4C EF: 62.6 %
Weight: 7312 oz

## 2022-06-27 LAB — BASIC METABOLIC PANEL
Anion gap: 11 (ref 5–15)
BUN: 10 mg/dL (ref 6–20)
CO2: 22 mmol/L (ref 22–32)
Calcium: 8.9 mg/dL (ref 8.9–10.3)
Chloride: 100 mmol/L (ref 98–111)
Creatinine, Ser: 0.59 mg/dL — ABNORMAL LOW (ref 0.61–1.24)
GFR, Estimated: >60 mL/min (ref >60)
Glucose, Bld: 228 mg/dL — ABNORMAL HIGH (ref 70–99)
Potassium: 3.8 mmol/L (ref 3.5–5.1)
Sodium: 133 mmol/L — ABNORMAL LOW (ref 135–145)

## 2022-06-27 LAB — GLUCOSE, CAPILLARY
Glucose-Capillary: 194 mg/dL — ABNORMAL HIGH (ref 70–99)
Glucose-Capillary: 217 mg/dL — ABNORMAL HIGH (ref 70–99)
Glucose-Capillary: 184 mg/dL — ABNORMAL HIGH (ref 70–99)

## 2022-06-27 LAB — HEPARIN LEVEL (UNFRACTIONATED)
Heparin Unfractionated: 0.24 IU/mL — ABNORMAL LOW (ref 0.30–0.70)
Heparin Unfractionated: 0.34 IU/mL (ref 0.30–0.70)
Heparin Unfractionated: 0.37 IU/mL (ref 0.30–0.70)

## 2022-06-27 LAB — CBG MONITORING, ED: Glucose-Capillary: 295 mg/dL — ABNORMAL HIGH (ref 70–99)

## 2022-06-27 MED ORDER — NYSTATIN 100000 UNIT/GM EX POWD
Freq: Three times a day (TID) | CUTANEOUS | Status: DC
  Filled 2022-06-27 (×2): qty 15

## 2022-06-27 MED ORDER — PERFLUTREN LIPID MICROSPHERE
1.0000 mL | INTRAVENOUS | Status: AC | PRN
  Administered 2022-06-27: 2 mL via INTRAVENOUS

## 2022-06-27 MED ORDER — HEPARIN BOLUS VIA INFUSION
3000.0000 [IU] | Freq: Once | INTRAVENOUS | Status: AC
  Administered 2022-06-27: 3000 [IU] via INTRAVENOUS
  Filled 2022-06-27: qty 3000

## 2022-06-27 MED ORDER — NICOTINE 14 MG/24HR TD PT24
14.0000 mg | MEDICATED_PATCH | Freq: Every day | TRANSDERMAL | Status: DC
  Filled 2022-06-27: qty 1

## 2022-06-27 NOTE — Progress Notes (Signed)
  Echocardiogram 2D Echocardiogram has been performed.  Larry Fowler 06/27/2022, 3:15 PM

## 2022-06-27 NOTE — Progress Notes (Signed)
ANTICOAGULATION CONSULT NOTE - Follow-up Note  Pharmacy Consult for Heparin Indication: pulmonary embolus and DVT  Allergies  Allergen Reactions   Metformin And Related Diarrhea and Nausea Only    Patient Measurements: Height: 6\' 3"  (190.5 cm) Weight: (!) 207.3 kg (457 lb) IBW/kg (Calculated) : 84.5 Heparin Dosing Weight: 136.1 kg  Vital Signs: Temp: 98 F (36.7 C) (01/21 0934) Temp Source: Oral (01/21 0934) BP: 116/67 (01/21 0745) Pulse Rate: 79 (01/21 0745)  Labs: Recent Labs    06/26/22 1559 06/26/22 1748 06/27/22 0215  HGB 15.7  --  14.2  HCT 45.8  --  41.3  PLT 147*  --  129*  HEPARINUNFRC  --   --  0.24*  CREATININE 0.66  --  0.59*  TROPONINIHS 13 12  --     Estimated Creatinine Clearance: 208.8 mL/min (A) (by C-G formula based on SCr of 0.59 mg/dL (L)).   Medical History: Past Medical History:  Diagnosis Date   ALLERGIC RHINITIS 09/12/2007   Qualifier: Diagnosis of  By: Jenny Reichmann MD, Hunt Oris    ANXIETY 02/16/2007   Qualifier: Diagnosis of  By: Reatha Armour, Lucy     ASTHMA 12/17/2009   Qualifier: Diagnosis of  By: Jenny Reichmann MD, Hunt Oris    Cannabis abuse    currently   Chills with fever    DEPRESSION 09/12/2007   Qualifier: Diagnosis of  By: Jenny Reichmann MD, Hunt Oris    History of cocaine use    in his 20's   Hyperglycemia    Rectal fistula 2009   Rectal pain    Renal stone    2008    Medications:  (Not in a hospital admission)  Scheduled:   insulin aspart  0-15 Units Subcutaneous TID WC   insulin aspart  0-5 Units Subcutaneous QHS   mometasone-formoterol  2 puff Inhalation BID   nicotine  14 mg Transdermal Daily   nystatin   Topical TID   Infusions:   heparin 2,700 Units/hr (06/27/22 0507)   PRN: acetaminophen **OR** acetaminophen, albuterol, diphenhydrAMINE, hydrALAZINE, melatonin, morphine injection, ondansetron **OR** ondansetron (ZOFRAN) IV, senna-docusate  Assessment: 27 yom with a history of morbid obesity, OSA, and hypoglycemia. Patient is presenting  with SOB. Heparin per pharmacy consult placed for pulmonary embolus and DVT.  Patient was not on anticoagulation prior to arrival. Started on 2300 units/hr IV heparin following 7500 unit IV heparin bolus. Resulting heparin level was sub-therapeutic. Infusion re-bolused with 3000 units and rate increased to 2700 units/hr.  Repeat heparin level is 0.34 which is borderline therapeutic.  No issues with infusion or bleeding per RN.  Hgb 15.7>14.2; plt 147>129  Goal of Therapy:  Heparin level 0.3-0.7 units/ml Monitor platelets by anticoagulation protocol: Yes   Plan:  Attending planning for a few days of heparin infusion prior to transition to oral option Slightly increase heparin infusion to 2800 units/hr Check anti-Xa level at 1800 and daily while on heparin Continue to monitor H&H and platelets  Lorelei Pont, PharmD, BCPS 06/27/2022 10:18 AM ED Clinical Pharmacist -  [phone]

## 2022-06-27 NOTE — ED Notes (Signed)
ED TO INPATIENT HANDOFF REPORT  ED Nurse Name and Phone #: [phone]  S Name/Age/Gender Larry Fowler 52 y.o. male Room/Bed: 012C/012C  Code Status   Code Status: Full Code  Home/SNF/Other Home Patient oriented to: self, place, time, and situation Is this baseline? Yes   Triage Complete: Triage complete  Chief Complaint Saddle pulmonary embolus [facility](HCC) [I26.92]  Triage Note Pt reports SHOB that started this week. Pt states it felt like a panic attack. Pt reports getting flush and diaphoretic. Pt also c/o right knee pain. Denies trauma.    Allergies Allergies  Allergen Reactions   Metformin And Related Diarrhea and Nausea Only    Level of Care/Admitting Diagnosis ED Disposition     ED Disposition  Admit   Condition  --   Comment  [facility] [[id number]]  Level of Care: Telemetry Medical [[id number]]  [facility] to [facility] or [facility] if equivalent level of care is available:: Yes  Covid Evaluation: Confirmed COVID Negative  Diagnosis: Saddle pulmonary embolus [facility](HCC) [[id number]][[id number]]  Admitting Physician: Alvester ChouROSLEY, DEBBY [[id number]]  Attending Physician: Gery PrayROSLEY, DEBBY [[id number]]  Certification:: I certify this patient will need inpatient services for at least 2 midnights  Estimated Length of Stay: 2          B Medical/Surgery History Past Medical History:  Diagnosis Date   ALLERGIC RHINITIS 09/12/2007   Qualifier: Diagnosis of  By: Jonny RuizJohn MD, Len BlalockJames W    ANXIETY 02/16/2007   Qualifier: Diagnosis of  By: Samara SnideBrand RMA, Lucy     ASTHMA 12/17/2009   Qualifier: Diagnosis of  By: Jonny RuizJohn MD, Len BlalockJames W    Cannabis abuse    currently   Chills with fever    DEPRESSION 09/12/2007   Qualifier: Diagnosis of  By: Jonny RuizJohn MD, Len BlalockJames W    History of cocaine use    in his 20's   Hyperglycemia    Rectal fistula 2009   Rectal pain    Renal stone    2008   Past Surgical History:  Procedure Laterality Date   FINGER SURGERY  2008/09?   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 which knee per Dr Thurston HoleWainer in his teens   REPAIR QUADRICEPS/HAMSTRING MUSCLES Bilateral 07/21/2018   Procedure: REPAIR BILATERAL QUADRICEPS;  Surgeon: Tarry KosXu, Naiping M, MD;  Location: [facility] OR;  Service: Orthopedics;  Laterality: Bilateral;   TONSILLECTOMY       A IV Location/Drains/Wounds Patient Lines/Drains/Airways Status     Active Line/Drains/Airways     Name Placement date Placement time Site Days   Peripheral IV 06/26/22 20 G Anterior;Left Forearm 06/26/22  1756  Forearm  1   Incision (Closed) 07/21/18 Knee Left 07/21/18  1500  -- 1437   Incision (Closed) 07/21/18 Knee Right 07/21/18  1500  -- 1437   Pressure Injury 07/29/18 Stage II -  Partial thickness loss of dermis presenting as a shallow open ulcer with a red, pink wound bed without slough. reddish, purple area missing tolp layer of epidermis 07/29/18  0800  -- 1429   Pressure Injury 07/29/18 Stage II -  Partial thickness loss of dermis presenting as a shallow open ulcer with a red, pink wound bed without slough. area to upper thigh, red, missing top layer of epidermis 07/29/18  0800  -- 1429   Pressure Injury 08/01/18 Stage II -  Partial thickness loss of dermis  presenting as a shallow open ulcer with a red, pink wound bed without slough. evolved to stage 2 when assessed on 3/4 08/01/18  1700  -- 1426            Intake/Output Last 24 hours No intake or output data in the 24 hours ending 06/27/22 1050  Labs/Imaging Results for orders placed or performed during the hospital encounter of 06/26/22 (from the past 48 hour(s))  Brain natriuretic peptide     Status: Abnormal   Collection Time: 06/26/22  3:48 PM  Result Value Ref Range   B Natriuretic Peptide 141.3 (H) 0.0 - 100.0 pg/mL    Comment: Performed at [facility], [address], [place], [place] [address]  Basic metabolic panel     Status: Abnormal   Collection Time: 06/26/22   3:59 PM  Result Value Ref Range   Sodium 135 135 - 145 mmol/L   Potassium 3.9 3.5 - 5.1 mmol/L   Chloride 99 98 - 111 mmol/L   CO2 23 22 - 32 mmol/L   Glucose, Bld 243 (H) 70 - 99 mg/dL    Comment: Glucose reference range applies only to samples taken after fasting for at least 8 hours.   BUN 10 6 - 20 mg/dL   Creatinine, Ser 0.66 0.61 - 1.24 mg/dL   Calcium 9.3 8.9 - 10.3 mg/dL   GFR, Estimated >60 >60 mL/min    Comment: (NOTE) Calculated using the CKD-EPI Creatinine Equation (2021)    Anion gap 13 5 - 15    Comment: Performed at [facility] [address], [place], [place] [address]  Troponin I (High Sensitivity)     Status: None   Collection Time: 06/26/22  3:59 PM  Result Value Ref Range   Troponin I (High Sensitivity) 13 <18 ng/L    Comment: (NOTE) Elevated high sensitivity troponin I (hsTnI) values and significant  changes across serial measurements may suggest ACS but many other  chronic and acute conditions are known to elevate hsTnI results.  Refer to the "Links" section for chest pain algorithms and additional  guidance. Performed at [facility], [place] [address], [place], [place] [address]   CBC with Differential     Status: Abnormal   Collection Time: 06/26/22  3:59 PM  Result Value Ref Range   WBC 13.2 (H) 4.0 - 10.5 K/uL   RBC 4.89 4.22 - 5.81 MIL/uL   Hemoglobin 15.7 13.0 - 17.0 g/dL   HCT 45.8 39.0 - 52.0 %   MCV 93.7 80.0 - 100.0 fL   MCH 32.1 26.0 - 34.0 pg   MCHC 34.3 30.0 - 36.0 g/dL   RDW 13.6 11.5 - 15.5 %   Platelets 147 (L) 150 - 400 K/uL   nRBC 0.0 0.0 - 0.2 %   Neutrophils Relative % 77 %   Neutro Abs 10.2 (H) 1.7 - 7.7 K/uL   Lymphocytes Relative 14 %   Lymphs Abs 1.9 0.7 - 4.0 K/uL   Monocytes Relative 6 %   Monocytes Absolute 0.8 0.1 - 1.0 K/uL   Eosinophils Relative 2 %   Eosinophils Absolute 0.2 0.0 - 0.5 K/uL   Basophils Relative 0 %   Basophils Absolute 0.1 0.0 - 0.1 K/uL   Immature Granulocytes 1 %   Abs Immature  Granulocytes 0.07 0.00 - 0.07 K/uL    Comment: Performed at [facility] [address], [place], [place] [address]  Troponin I (High Sensitivity)  Status: None   Collection Time: 06/26/22  5:48 PM  Result Value Ref Range   Troponin I (High Sensitivity) 12 <18 ng/L    Comment: (NOTE) Elevated high sensitivity troponin I (hsTnI) values and significant  changes across serial measurements may suggest ACS but many other  chronic and acute conditions are known to elevate hsTnI results.  Refer to the "Links" section for chest pain algorithms and additional  guidance. Performed at [facility], [address] [place], [place] [address]   CBG monitoring, ED     Status: Abnormal   Collection Time: 06/26/22 11:54 PM  Result Value Ref Range   Glucose-Capillary 243 (H) 70 - 99 mg/dL    Comment: Glucose reference range applies only to samples taken after fasting for at least 8 hours.  Heparin level (unfractionated)     Status: Abnormal   Collection Time: 06/27/22  2:15 AM  Result Value Ref Range   Heparin Unfractionated 0.24 (L) 0.30 - 0.70 IU/mL    Comment: (NOTE) The clinical reportable range upper limit is being lowered to >1.10 to align with the FDA approved guidance for the current laboratory assay.  If heparin results are below expected values, and patient dosage has  been confirmed, suggest follow up testing of antithrombin III levels. Performed at [facility], [address] [place], [place] [address]   Basic metabolic panel     Status: Abnormal   Collection Time: 06/27/22  2:15 AM  Result Value Ref Range   Sodium 133 (L) 135 - 145 mmol/L   Potassium 3.8 3.5 - 5.1 mmol/L   Chloride 100 98 - 111 mmol/L   CO2 22 22 - 32 mmol/L   Glucose, Bld 228 (H) 70 - 99 mg/dL    Comment: Glucose reference range applies only to samples taken after fasting for at least 8 hours.   BUN 10 6 - 20 mg/dL   Creatinine, Ser 8.290.59 (L) 0.61 - 1.24 mg/dL   Calcium 8.9 8.9 - 56.210.3  mg/dL   GFR, Estimated >13>60 >08>60 mL/min    Comment: (NOTE) Calculated using the CKD-EPI Creatinine Equation (2021)    Anion gap 11 5 - 15    Comment: Performed at [name], [address] [place], [place] [address]  CBC     Status: Abnormal   Collection Time: 06/27/22  2:15 AM  Result Value Ref Range   WBC 12.3 (H) 4.0 - 10.5 K/uL   RBC 4.38 4.22 - 5.81 MIL/uL   Hemoglobin 14.2 13.0 - 17.0 g/dL   HCT 69.641.3 29.539.0 - 28.452.0 %   MCV 94.3 80.0 - 100.0 fL   MCH 32.4 26.0 - 34.0 pg   MCHC 34.4 30.0 - 36.0 g/dL   RDW 13.213.7 44.011.5 - 10.215.5 %   Platelets 129 (L) 150 - 400 K/uL   nRBC 0.0 0.0 - 0.2 %    Comment: Performed at [facility], [address] [place], [place] [address]  CBG monitoring, ED     Status: Abnormal   Collection Time: 06/27/22  8:42 AM  Result Value Ref Range   Glucose-Capillary 295 (H) 70 - 99 mg/dL    Comment: Glucose reference range applies only to samples taken after fasting for at least 8 hours.   Comment 1 Notify RN    Comment 2 Document in Chart    CT VENOGRAM ABDOMEN PELVIS  Addendum Date: 06/26/2022   ADDENDUM REPORT: 06/26/2022 19:44 ADDENDUM: Comparison is made to  same day ultrasound examination of the bilateral lower extremities performed by vascular lab; reported deep venous thrombus of the right lower extremity is not well appreciated by CT venogram. Ultrasound is generally the test of choice for the evaluation of lower extremity deep venous thrombosis. Electronically Signed   By: Jearld Lesch M.D.   On: 06/26/2022 19:44   Result Date: 06/26/2022 CLINICAL DATA:  Deep venous thrombosis EXAM: CT VENOGRAM ABDOMEN AND PELVIS TECHNIQUE: CT venographic examination of the abdomen, pelvis, and lower extremities was performed following the administration of intravenous contrast. RADIATION DOSE REDUCTION: This exam was performed according to the departmental dose-optimization program which includes automated exposure control, adjustment of the mA and/or kV  according to patient size and/or use of iterative reconstruction technique. CONTRAST:  OMNIPAQUE IOHEXOL 350 MG/ML SOLN COMPARISON:  None Available. FINDINGS: CT VENOGRAM Normal appearance of the inferior vena cava, bilateral iliac vein systems, and lower extremity veins, imaged through the lower calves. No evidence of thrombus, occlusion, or other abnormality. Superficial varicosities about the lower extremities. CT ABDOMEN, PELVIS, AND LOWER EXTREMITIES Lower chest: Please see separately reported examination of the chest. Hepatobiliary: No solid liver abnormality is seen. Hepatomegaly, maximum coronal span 22.9 cm. Hepatic steatosis. No gallstones, gallbladder wall thickening, or biliary dilatation. Pancreas: Unremarkable. No pancreatic ductal dilatation or surrounding inflammatory changes. Spleen: Normal in size without significant abnormality. Adrenals/Urinary Tract: Adrenal glands are unremarkable. Nonobstructive bilateral renal calculi. No hydronephrosis. Bladder is unremarkable. Stomach/Bowel: Stomach is within normal limits. Appendix appears normal. No evidence of bowel wall thickening, distention, or inflammatory changes. Lymphatic: No enlarged abdominal or pelvic lymph nodes. Reproductive: No mass or other significant abnormality. Other: Small, fat containing left inguinal hernia.  No ascites. Musculoskeletal: No acute or significant osseous findings. Lower extremities: Superficial soft tissue edema throughout the lower extremities. Subcutaneous calcification of the calves, likely related to chronic venous stasis. IMPRESSION: 1. Normal appearance of the inferior vena cava, bilateral iliac vein systems, and lower extremity veins, imaged through the lower calves. No evidence of thrombus, occlusion, or other abnormality. 2. Superficial soft tissue edema throughout the lower extremities. Subcutaneous calcification of the calves, likely related to chronic venous stasis. 3. Hepatomegaly and hepatic  steatosis. 4. Nonobstructive bilateral renal calculi. Electronically Signed: By: Jearld Lesch M.D. On: 06/26/2022 19:35   CT Angio Chest PE W/Cm &/Or Wo Cm  Result Date: 06/26/2022 CLINICAL DATA:  PE suspected EXAM: CT ANGIOGRAPHY CHEST WITH CONTRAST TECHNIQUE: Multidetector CT imaging of the chest was performed using the standard protocol during bolus administration of intravenous contrast. Multiplanar CT image reconstructions and MIPs were obtained to evaluate the vascular anatomy. RADIATION DOSE REDUCTION: This exam was performed according to the departmental dose-optimization program which includes automated exposure control, adjustment of the mA and/or kV according to patient size and/or use of iterative reconstruction technique. CONTRAST:  OMNIPAQUE IOHEXOL 350 MG/ML SOLN COMPARISON:  None Available. FINDINGS: Cardiovascular: Satisfactory opacification of the pulmonary arteries to the segmental level. Positive examination for pulmonary embolism, with saddle embolus present as well as bilateral lobar and segmental embolus throughout the lungs. Most proximal occlusive embolus is in the distal left pulmonary artery (series 8, image 46). Global cardiomegaly with enlargement of the RV LV ratio to 1.5. The main pulmonary artery measures up to 2.9 cm in caliber. No pericardial effusion. Mediastinum/Nodes: No enlarged mediastinal, hilar, or axillary lymph nodes. Thyroid gland, trachea, and esophagus demonstrate no significant findings. Lungs/Pleura: Lungs are clear. No pleural effusion or pneumothorax. Upper Abdomen: Please see separately  reported examination of the abdomen and pelvis. Musculoskeletal: No chest wall abnormality. No acute osseous findings. Review of the MIP images confirms the above findings. IMPRESSION: 1. Positive examination for pulmonary embolism, with saddle embolus present as well as bilateral lobar and segmental embolus throughout the lungs. Most proximal occlusive embolus is in the  distal left pulmonary artery. 2. Global cardiomegaly with enlargement of the RV LV ratio to 1.5. The main pulmonary artery measures up to 2.9 cm in caliber. Findings are consistent with right heart strain. These results were called by telephone at the time of interpretation on 06/26/2022 at 7:24 pm to Dr. Shirlyn Goltz who verbally acknowledged these results. Electronically Signed   By: Delanna Ahmadi M.D.   On: 06/26/2022 19:36   DG Chest 2 View  Result Date: 06/26/2022 CLINICAL DATA:  Shortness of breath, chest pain EXAM: CHEST - 2 VIEW COMPARISON:  02/22/2007 FINDINGS: Enlargement of cardiac silhouette. Mediastinal contours and pulmonary vascularity normal. Lungs clear. No pulmonary infiltrate, pleural effusion, or pneumothorax. Osseous structures unremarkable. IMPRESSION: Mild enlargement of cardiac silhouette. No acute infiltrate. Electronically Signed   By: Lavonia Dana M.D.   On: 06/26/2022 17:02   VAS [place] LOWER EXTREMITY VENOUS (DVT) (7a-7p)  Result Date: 06/26/2022  Lower Venous DVT Study Patient Name:  Larry Fowler  Date of Exam:   06/26/2022 Medical Rec #: [id number]          Accession #:    [id number] Date of Birth: 11/12/1970         Patient Gender: M Patient Age:   30 years Exam Location:  [facility] Procedure:      VAS [place] LOWER EXTREMITY VENOUS (DVT) Referring Phys: HALEY SAGE --------------------------------------------------------------------------------  Indications: Right lower extremity pain and swelling with associated shortness of breath. Other Indications: Patient with bilateral brawny discoloration of the calves. Comparison Study: 08-02-2018 Prior bilateral lower extremity venous study was                   negative for DVT. Performing Technologist: Darlin Coco RDMS, RVT  Examination Guidelines: A complete evaluation includes B-mode imaging, spectral Doppler, color Doppler, and power Doppler as needed of all accessible portions of each vessel. Bilateral testing is considered an  integral part of a complete examination. Limited examinations for reoccurring indications may be performed as noted. The reflux portion of the exam is performed with the patient in reverse Trendelenburg.  +---------+---------------+---------+-----------+----------+--------------+ RIGHT    CompressibilityPhasicitySpontaneityPropertiesThrombus Aging +---------+---------------+---------+-----------+----------+--------------+ CFV      Partial        Yes      Yes                  Acute          +---------+---------------+---------+-----------+----------+--------------+ SFJ      Full                                                        +---------+---------------+---------+-----------+----------+--------------+ FV Prox  None           No       No                   Acute          +---------+---------------+---------+-----------+----------+--------------+ FV Mid   None  No       No                   Acute          +---------+---------------+---------+-----------+----------+--------------+ FV DistalNone           No       No                   Acute          +---------+---------------+---------+-----------+----------+--------------+ PFV      Full                                                        +---------+---------------+---------+-----------+----------+--------------+ POP      None           No       No                   Acute          +---------+---------------+---------+-----------+----------+--------------+ PTV      None           No       No                   Acute          +---------+---------------+---------+-----------+----------+--------------+ PERO     None           No       No                   Acute          +---------+---------------+---------+-----------+----------+--------------+   +----+---------------+---------+-----------+----------+--------------+ LEFTCompressibilityPhasicitySpontaneityPropertiesThrombus Aging  +----+---------------+---------+-----------+----------+--------------+ CFV Full           Yes      Yes                                 +----+---------------+---------+-----------+----------+--------------+     Summary: RIGHT: - Findings consistent with acute deep vein thrombosis involving the right common femoral vein, right femoral vein, right popliteal vein, right posterior tibial veins, and right peroneal veins.  - No cystic structure found in the popliteal fossa.  LEFT: - No evidence of common femoral vein obstruction.  *See table(s) above for measurements and observations.    Preliminary     Pending Labs Unresulted Labs (From admission, onward)     Start     Ordered   06/28/22 0500  Heparin level (unfractionated)  Daily,   R      06/26/22 1958   06/27/22 1100  Heparin level (unfractionated)  Once-Timed,   TIMED        06/27/22 0455            Vitals/Pain Today's Vitals   06/27/22 0507 06/27/22 0745 06/27/22 0934 06/27/22 0934  BP:  116/67    Pulse:  79    Resp:  17    Temp: 98 F (36.7 C)   98 F (36.7 C)  TempSrc: Oral   Oral  SpO2:  95%    Weight:      Height:      PainSc:   8      Isolation Precautions No active isolations  Medications Medications  heparin ADULT infusion 100 units/mL (  25000 units/255mL) (2,700 Units/hr Intravenous Rate/Dose Change 06/27/22 0507)  morphine (PF) 2 MG/ML injection 2 mg (2 mg Intravenous Given 06/27/22 0848)  mometasone-formoterol (DULERA) 100-5 MCG/ACT inhaler 2 puff (2 puffs Inhalation Given 06/27/22 0839)  insulin aspart (novoLOG) injection 0-15 Units (8 Units Subcutaneous Given 06/27/22 0848)  insulin aspart (novoLOG) injection 0-5 Units (2 Units Subcutaneous Given 06/27/22 0025)  acetaminophen (TYLENOL) tablet 650 mg (has no administration in time range)    Or  acetaminophen (TYLENOL) suppository 650 mg (has no administration in time range)  senna-docusate (Senokot-S) tablet 1 tablet (has no administration in time range)   ondansetron (ZOFRAN) tablet 4 mg ( Oral See Alternative 06/27/22 0848)    Or  ondansetron (ZOFRAN) injection 4 mg (4 mg Intravenous Given 06/27/22 0848)  albuterol (PROVENTIL) (2.5 MG/3ML) 0.083% nebulizer solution 2.5 mg (has no administration in time range)  hydrALAZINE (APRESOLINE) injection 5 mg (has no administration in time range)  diphenhydrAMINE (BENADRYL) capsule 25 mg (has no administration in time range)  melatonin tablet 3 mg (has no administration in time range)  nicotine (NICODERM CQ - dosed in mg/24 hours) patch 14 mg (14 mg Transdermal Not Given 06/27/22 0851)  nystatin (MYCOSTATIN/NYSTOP) topical powder ( Topical Not Given 06/27/22 1050)  iohexol (OMNIPAQUE) 350 MG/ML injection 125 mL (125 mLs Intravenous Contrast Given 06/26/22 1859)  HYDROmorphone (DILAUDID) injection 1 mg (1 mg Intravenous Given 06/26/22 2010)  heparin bolus via infusion 7,500 Units (7,500 Units Intravenous Bolus from Bag 06/26/22 2019)  heparin bolus via infusion 3,000 Units (3,000 Units Intravenous Bolus from Bag 06/27/22 0508)    Mobility walks with device     Focused Assessments Pulmonary Assessment Handoff:  Lung sounds: Bilateral Breath Sounds: Clear O2 Device: Room Air      R Recommendations: See Admitting Provider Note  Report given to:   Additional Notes:

## 2022-06-27 NOTE — Progress Notes (Signed)
ANTICOAGULATION CONSULT NOTE - Follow-up Note  Pharmacy Consult for Heparin Indication: pulmonary embolus and DVT  Allergies  Allergen Reactions   Metformin And Related Diarrhea and Nausea Only    Patient Measurements: Height: 6\' 3"  (190.5 cm) Weight: (!) 207.3 kg (457 lb) IBW/kg (Calculated) : 84.5 Heparin Dosing Weight: 136.1 kg  Vital Signs: Temp: 98.3 F (36.8 C) (01/21 1526) Temp Source: Oral (01/21 1526) BP: 132/75 (01/21 1526) Pulse Rate: 88 (01/21 1526)  Labs: Recent Labs    06/26/22 1559 06/26/22 1748 06/27/22 0215 06/27/22 1100 06/27/22 1800  HGB 15.7  --  14.2  --   --   HCT 45.8  --  41.3  --   --   PLT 147*  --  129*  --   --   HEPARINUNFRC  --   --  0.24* 0.34 0.37  CREATININE 0.66  --  0.59*  --   --   TROPONINIHS 13 12  --   --   --      Estimated Creatinine Clearance: 208.8 mL/min (A) (by C-G formula based on SCr of 0.59 mg/dL (L)).   Medical History: Past Medical History:  Diagnosis Date   ALLERGIC RHINITIS 09/12/2007   Qualifier: Diagnosis of  By: Jenny Reichmann MD, Hunt Oris    ANXIETY 02/16/2007   Qualifier: Diagnosis of  By: Reatha Armour, Lucy     ASTHMA 12/17/2009   Qualifier: Diagnosis of  By: Jenny Reichmann MD, Hunt Oris    Cannabis abuse    currently   Chills with fever    DEPRESSION 09/12/2007   Qualifier: Diagnosis of  By: Jenny Reichmann MD, Hunt Oris    History of cocaine use    in his 20's   Hyperglycemia    Rectal fistula 2009   Rectal pain    Renal stone    2008    Medications:  Medications Prior to Admission  Medication Sig Dispense Refill Last Dose   albuterol (VENTOLIN HFA) 108 (90 Base) MCG/ACT inhaler Inhale 2 puffs into the lungs every 6 (six) hours as needed for wheezing. 18 g 5 06/26/2022   aspirin EC 81 MG tablet Take 81 mg by mouth once as needed (chest pain).   06/26/2022   Bacitracin-Polymyxin B (NEOSPORIN EX) Apply 1 application  topically daily as needed (irritation of perineum).   06/25/2022   diphenhydrAMINE HCl (BENADRYL MAXIMUM STRENGTH EX)  Apply 1 application  topically 2 (two) times daily as needed (irritation of perineum).   06/25/2022   pioglitazone (ACTOS) 15 MG tablet Take 1 tablet (15 mg total) by mouth daily. 90 tablet 3 06/25/2022   SYMBICORT 80-4.5 MCG/ACT inhaler INHALE 2 PUFFS INTO THE LUNGS 2 TIMES DAILY. (Patient taking differently: Inhale 1 puff into the lungs daily as needed (wheezing, shortness of breath).) 10.2 g 6 06/25/2022   clindamycin (CLEOCIN) 300 MG capsule Take 1 capsule (300 mg total) by mouth 3 (three) times daily. (Patient not taking: Reported on 06/26/2022) 30 capsule 0 Not Taking    Scheduled:   insulin aspart  0-15 Units Subcutaneous TID WC   insulin aspart  0-5 Units Subcutaneous QHS   mometasone-formoterol  2 puff Inhalation BID   nicotine  14 mg Transdermal Daily   nystatin   Topical TID   Infusions:   heparin 2,800 Units/hr (06/27/22 1500)   PRN: acetaminophen **OR** acetaminophen, albuterol, diphenhydrAMINE, hydrALAZINE, melatonin, morphine injection, ondansetron **OR** ondansetron (ZOFRAN) IV, senna-docusate  Assessment: 27 yom with a history of morbid obesity, OSA, and hypoglycemia. Patient is presenting  with SOB. Heparin per pharmacy consult placed for pulmonary embolus and DVT.  Patient was not on anticoagulation prior to arrival. Started on 2300 units/hr IV heparin following 7500 unit IV heparin bolus. Resulting heparin level was sub-therapeutic. Infusion re-bolused with 3000 units and rate increased to 2700 units/hr.  Repeat heparin level is 0.34 which is borderline therapeutic.  No issues with infusion or bleeding per RN.  Hgb 15.7>14.2; plt 147>129  Heparin level has been therapeutic x2. We will cont with current rate and check in AM  Goal of Therapy:  Heparin level 0.3-0.7 units/ml Monitor platelets by anticoagulation protocol: Yes   Plan:  Cont heparin infusion 2800 units/hr Check anti-Xa leveldaily while on heparin Continue to monitor H&H and platelets  Onnie Boer,  PharmD, BCIDP, AAHIVP, CPP Infectious Disease Pharmacist 06/27/2022 6:50 PM

## 2022-06-27 NOTE — Progress Notes (Signed)
ANTICOAGULATION CONSULT NOTE - Follow Up Consult  Pharmacy Consult for heparin Indication:  PE/DVT  Labs: Recent Labs    06/26/22 1559 06/26/22 1748 06/27/22 0215  HGB 15.7  --  14.2  HCT 45.8  --  41.3  PLT 147*  --  129*  HEPARINUNFRC  --   --  0.24*  CREATININE 0.66  --  0.59*  TROPONINIHS 13 12  --     Assessment: 51yo male subtherapeutic on heparin with initial dosing for PE/DVT; no infusion issues or signs of bleeding per RN.  Goal of Therapy:  Heparin level 0.3-0.7 units/ml   Plan:  Will rebolus with heparin 3000 units and increase heparin infusion by 2 units/kg/hr to 2700 units/hr and check level in 6 hours.    Wynona Neat, PharmD, BCPS  06/27/2022,4:57 AM

## 2022-06-27 NOTE — Progress Notes (Addendum)
Patient: Larry Fowler. Larry Fowler DOB: December 10, 1970 DOA: 06/26/2022 PCP: Larry Cower MD CC: Shortness of breath  Brief Hx:  This is a 52 year old male with past medical history of extreme morbid obesity, obstructive sleep apnea, hypoglycemia. Patient presents with shortness of breath. Monday night he went to bed, he is also short of breath and found he could not catch his breath. Symptoms have been going on for last 5 days. He endorses being sedentary for the last 5-6 months and has been working from home requiring him to sit for prolonged period of time.   Subjective:  Overnight:NAEON  No acute concerns. States doing well.   Objective:  Vital signs in last 24 hours: Vitals:   06/27/22 0137 06/27/22 0400 06/27/22 0500 06/27/22 0507  BP:  117/67 130/75   Pulse:  77 78   Resp:  (!) 30 18   Temp: 97.9 F (36.6 C)   98 F (36.7 C)  TempSrc: Oral   Oral  SpO2:  96% 95%   Weight:      Height:       Supplemental O2: Room Air   SpO2: 95 % Filed Weights   06/26/22 1952  Weight: (!) 207.3 kg   No intake or output data in the 24 hours ending 06/27/22 0700 Net IO Since Admission: No IO data has been entered for this period [06/27/22 0700] Physical Exam General: Obese male in NAD, resting comfortably in bed HENT: NCAT Lungs:  CTAB Cardiovascular: NSR  Abdomen: No TTP MSK: No asymmetry Skin: skin rash in the perineal area Neuro: alert and oriented x4 Psych: normal mood and normal affect  Diagnostics    Latest Ref Rng & Units 06/27/2022    2:15 AM 06/26/2022    3:59 PM 12/28/2019   12:06 PM  CBC  WBC 4.0 - 10.5 K/uL 12.3  13.2  8.6   Hemoglobin 13.0 - 17.0 g/dL 14.2  15.7  14.3   Hematocrit 39.0 - 52.0 % 41.3  45.8  43.1   Platelets 150 - 400 K/uL 129  147  166        Latest Ref Rng & Units 06/27/2022    2:15 AM 06/26/2022    3:59 PM 12/28/2019   12:06 PM  CMP  Glucose 70 - 99 mg/dL 228  243    BUN 6 - 20 mg/dL 10  10    Creatinine 0.61 - 1.24 mg/dL 0.59  0.66    Sodium  135 - 145 mmol/L 133  135    Potassium 3.5 - 5.1 mmol/L 3.8  3.9    Chloride 98 - 111 mmol/L 100  99    CO2 22 - 32 mmol/L 22  23    Calcium 8.9 - 10.3 mg/dL 8.9  9.3    Total Protein 6.1 - 8.1 g/dL   6.1   Total Bilirubin 0.2 - 1.2 mg/dL   0.5   AST 10 - 40 U/L   26   ALT 9 - 46 U/L   26      CT VENOGRAM ABDOMEN PELVIS Addendum Date: 06/26/2022   ADDENDUM REPORT: 06/26/2022 19:44 ADDENDUM: Comparison is made to same day ultrasound examination of the bilateral lower extremities performed by vascular lab; reported deep venous thrombus of the right lower extremity is not well appreciated by CT venogram. Ultrasound is generally the test of choice for the evaluation of lower extremity deep venous thrombosis. Electronically Signed   By: Larry Fowler M.D.   On: 06/26/2022 19:44  Result Date: 06/26/2022 IMPRESSION: 1. Normal appearance of the inferior vena cava, bilateral iliac vein systems, and lower extremity veins, imaged through the lower calves. No evidence of thrombus, occlusion, or other abnormality. 2. Superficial soft tissue edema throughout the lower extremities. Subcutaneous calcification of the calves, likely related to chronic venous stasis. 3. Hepatomegaly and hepatic steatosis. 4. Nonobstructive bilateral renal calculi. Electronically Signed: By: Larry Fowler M.D. On: 06/26/2022 19:35   CT Angio Chest PE W/Cm &/Or Wo Cm Result Date: 06/26/2022 IMPRESSION: 1. Positive examination for pulmonary embolism, with saddle embolus present as well as bilateral lobar and segmental embolus throughout the lungs. Most proximal occlusive embolus is in the distal left pulmonary artery. 2. Global cardiomegaly with enlargement of the RV LV ratio to 1.5. The main pulmonary artery measures up to 2.9 cm in caliber. Findings are consistent with right heart strain. These results were called by telephone at the time of interpretation on 06/26/2022 at 7:24 pm to Dr. Chaney Fowler who verbally acknowledged these  results. Electronically Signed   By: Larry Fowler M.D.   On: 06/26/2022 19:36   DG Chest 2 View  Result Date: 06/26/2022 IMPRESSION: Mild enlargement of cardiac silhouette. No acute infiltrate. Electronically Signed   By: Larry Fowler M.D.   On: 06/26/2022 17:02   VAS [place] LOWER EXTREMITY VENOUS (DVT) (7a-7p) Result Date: 06/26/2022 Summary: RIGHT: - Findings consistent with acute deep vein thrombosis involving the right common femoral vein, right femoral vein, right popliteal vein, right posterior tibial veins, and right peroneal veins.  - No cystic structure found in the popliteal fossa.  LEFT: - No evidence of common femoral vein obstruction.  *See table(s) above for measurements and observations.    Preliminary       Assessment/Plan Principal Problem:   Saddle pulmonary embolus (HCC) Active Problems:   Morbid obesity (HCC)   Smoker   HTN (hypertension)   Diabetes (HCC)   Anxiety and depression   DVT (deep venous thrombosis) (HCC)  Larry Fowler is a 52 y.o. with PMH of Class III Obesity, obstructive sleep apnea, and hx of DMII with hypoglycemia admit for Saddle PE on hospital day 1.   Saddle Pulmonary Embolism + Acute DVT of RLE DVT noted on lower extremity doppler. CTA positive for saddle PE. Right heart strain suggested by CTA but will get echo as pt has OSA and can have chronic RV enlargement 2/2 to OSA. Pt on heparin and will continue this for a few days to ensure pt is stable prior to switching to DOAC and complete 3 months. ED consulted PCCM and Dr. Marcos Fowler does not recommend thrombectomy.  Will defer hypercoagulable work up to PCP after this period. Patient currently satting well on RA.  Pt is very high risk given significant clot burden in the pulmonary vasculature and lower extremity. Fortunately, pt does not have any further clots in the abdomen and pelvis.   DMII Chronic. A1c 7.8 on 03/01/22. SSI ordered. Would be a good candidate for GLP 1 agonist but will defer this to  PCP.   Intertrigo  2/2 to obesity.  Pt states he does not use anything when the rash clears and then it recurs. Will start Nystatin powder TID.   Tobacco use disorder Chronic. Nicotene patch ordered and counseling provided.   COPD Chronic. On Symbicort at home. Will give dulera while pt is here and switch to symbicort on discharge.   Class III Obesity Chronic. BMI 57. Counseled on weight loss and healthy eating.  Diet: Carb-Modified IVF: None,None VTE: Heparin Code: Full Prior to Admission Living Arrangement: Home Anticipated Discharge Location:Home  Barriers to Discharge: Medical Stability Dispo: Anticipated discharge in approximately 1-2 day(s).   Idamae Schuller, MD Tillie Rung. [facility] Internal Medicine Residency, PGY-2  Pager: [phone] After 5 pm and on weekends: Please call the on-call pager   Attestation -  I have directly reviewed the clinical findings, lab results and imaging studies. I have interviewed and examined the patient and agree with the documentation and management as recorded by Dr.Khan -morbidly obese and relatively sedentary male admitted for DVT in the right lower extremity with large PE satisfying criteria for saddle PE.  Case discussed by the admitting team with ICU physician Dr. Patsey Berthold.  Plan is to anticoagulate with heparin for 24 to 48 hours, fortunately he is hemodynamically stable.  Subsequently will be transition to either oral anticoagulant or Lovenox.  Likely cause of his blood clots are his morbid obesity and lack of activity.  However due to his young age may benefit from one-time outpatient hematology follow-up in 1 to 2 weeks postdischarge.  Lala Lund M.D on 06/27/2022 at 10:07 AM  [facility] Office  [phone]

## 2022-06-28 ENCOUNTER — Other Ambulatory Visit (HOSPITAL_COMMUNITY): Payer: Self-pay

## 2022-06-28 LAB — GLUCOSE, CAPILLARY
Glucose-Capillary: 243 mg/dL — ABNORMAL HIGH (ref 70–99)
Glucose-Capillary: 157 mg/dL — ABNORMAL HIGH (ref 70–99)
Glucose-Capillary: 159 mg/dL — ABNORMAL HIGH (ref 70–99)
Glucose-Capillary: 143 mg/dL — ABNORMAL HIGH (ref 70–99)

## 2022-06-28 LAB — CBC
HCT: 37.3 % — ABNORMAL LOW (ref 39.0–52.0)
Hemoglobin: 12.6 g/dL — ABNORMAL LOW (ref 13.0–17.0)
MCH: 32.1 pg (ref 26.0–34.0)
MCHC: 33.8 g/dL (ref 30.0–36.0)
MCV: 94.9 fL (ref 80.0–100.0)
Platelets: 128 10*3/uL — ABNORMAL LOW (ref 150–400)
RBC: 3.93 MIL/uL — ABNORMAL LOW (ref 4.22–5.81)
RDW: 13.7 % (ref 11.5–15.5)
WBC: 9.9 10*3/uL (ref 4.0–10.5)
nRBC: 0 % (ref 0.0–0.2)

## 2022-06-28 LAB — HEPARIN LEVEL (UNFRACTIONATED): Heparin Unfractionated: 0.33 IU/mL (ref 0.30–0.70)

## 2022-06-28 MED ORDER — APIXABAN 5 MG PO TABS: 5.0000 mg | ORAL_TABLET | Freq: Two times a day (BID) | ORAL | Status: DC

## 2022-06-28 MED ORDER — APIXABAN 5 MG PO TABS: 10.0000 mg | ORAL_TABLET | Freq: Two times a day (BID) | ORAL | Status: DC

## 2022-06-28 MED ORDER — ENOXAPARIN SODIUM 300 MG/3ML IJ SOLN
210.0000 mg | Freq: Two times a day (BID) | INTRAMUSCULAR | Status: DC
  Administered 2022-06-28: 210 mg via SUBCUTANEOUS
  Filled 2022-06-28: qty 2.1

## 2022-06-28 MED ORDER — HEPARIN (PORCINE) 25000 UT/250ML-% IV SOLN
2900.0000 [IU]/h | INTRAVENOUS | Status: DC
  Administered 2022-06-28: 2900 [IU]/h via INTRAVENOUS
  Filled 2022-06-28: qty 250

## 2022-06-28 MED ORDER — ENOXAPARIN SODIUM 300 MG/3ML IJ SOLN
210.0000 mg | Freq: Two times a day (BID) | INTRAMUSCULAR | Status: DC
  Administered 2022-06-29: 210 mg via SUBCUTANEOUS
  Filled 2022-06-28 (×2): qty 2.1

## 2022-06-28 MED ORDER — INSULIN ASPART 100 UNIT/ML IJ SOLN
0.0000 [IU] | Freq: Three times a day (TID) | INTRAMUSCULAR | Status: DC
  Administered 2022-06-28 – 2022-06-29 (×2): 4 [IU] via SUBCUTANEOUS

## 2022-06-28 MED ORDER — ENOXAPARIN SODIUM 300 MG/3ML IJ SOLN
210.0000 mg | Freq: Two times a day (BID) | INTRAMUSCULAR | Status: DC
  Filled 2022-06-28 (×3): qty 2.1

## 2022-06-28 NOTE — Progress Notes (Signed)
Interval update:  Pt refuses his anticoagulant to be changed to Lovenox and wants a different option. He states injections in the belly lead to development of "knots". Advised on the fact Lovenox may be superior in his case given his BMI but pt would like an oral option. Will continue heparin gtt for today and plan to transition to [place] tomorrow.   Idamae Schuller, MD [place]. [facility] Internal Medicine Residency, PGY-2

## 2022-06-28 NOTE — Progress Notes (Addendum)
Patient: Larry Fowler DOB: 1970/10/13 DOA: 06/26/2022 PCP: Oliver Barre MD CC: Shortness of breath  Brief Hx:  This is a 52 year old male with past medical history of extreme morbid obesity, obstructive sleep apnea, hypoglycemia. Patient presents with shortness of breath. Monday night he went to bed, he is also short of breath and found he could not catch his breath. Symptoms have been going on for last 5 days. He endorses being sedentary for the last 5-6 months and has been working from home requiring him to sit for prolonged period of time.   Subjective:  Overnight:NAEON  States doing well. No acute concerns endorsed. He walked around the room and stated he did well.   Objective:  Vital signs in last 24 hours: Vitals:   06/27/22 1156 06/27/22 1526 06/27/22 1934 06/27/22 1948  BP: (!) 132/93 132/75  (!) 108/56  Pulse: 71 88 71 72  Resp: 17 18 16 16   Temp: 97.9 F (36.6 C) 98.3 F (36.8 C)  98.3 F (36.8 C)  TempSrc: Oral Oral  Oral  SpO2: 93% 94% 97%   Weight:      Height:       Supplemental O2: Room Air   SpO2: 97 % Filed Weights   06/26/22 1952  Weight: (!) 207.3 kg    Intake/Output Summary (Last 24 hours) at 06/28/2022 06/30/2022 Last data filed at 06/28/2022 0500 Gross per 24 hour  Intake --  Output 300 ml  Net -300 ml   Net IO Since Admission: -300 mL [06/28/22 0617] Physical Exam  General: Obese male in NAD, resting comfortably in bed HENT: NCAT Lungs:  CTAB Cardiovascular: NSR, distant heart sounds Abdomen: No TTP MSK: R with bigger than left Skin: skin rash in the perineal area Neuro: alert and oriented x4 Psych: normal mood and normal affect  Diagnostics    Latest Ref Rng & Units 06/28/2022   12:45 AM 06/27/2022    2:15 AM 06/26/2022    3:59 PM  CBC  WBC 4.0 - 10.5 K/uL 9.9  12.3  13.2   Hemoglobin 13.0 - 17.0 g/dL 06/28/2022  62.1  30.8   Hematocrit 39.0 - 52.0 % 37.3  41.3  45.8   Platelets 150 - 400 K/uL 128  129  147        Latest Ref Rng &  Units 06/27/2022    2:15 AM 06/26/2022    3:59 PM 12/28/2019   12:06 PM  CMP  Glucose 70 - 99 mg/dL 12/30/2019  846    BUN 6 - 20 mg/dL 10  10    Creatinine 962 - 1.24 mg/dL 9.52  8.41    Sodium 3.24 - 145 mmol/L 133  135    Potassium 3.5 - 5.1 mmol/L 3.8  3.9    Chloride 98 - 111 mmol/L 100  99    CO2 22 - 32 mmol/L 22  23    Calcium 8.9 - 10.3 mg/dL 8.9  9.3    Total Protein 6.1 - 8.1 g/dL   6.1   Total Bilirubin 0.2 - 1.2 mg/dL   0.5   AST 10 - 40 U/L   26   ALT 9 - 46 U/L   26      CT VENOGRAM ABDOMEN PELVIS Addendum Date: 06/26/2022   ADDENDUM REPORT: 06/26/2022 19:44 ADDENDUM: Comparison is made to same day ultrasound examination of the bilateral lower extremities performed by vascular lab; reported deep venous thrombus of the right lower extremity is not well appreciated by  CT venogram. Ultrasound is generally the test of choice for the evaluation of lower extremity deep venous thrombosis. Electronically Signed   By: Delanna Ahmadi M.D.   On: 06/26/2022 19:44   Result Date: 06/26/2022 IMPRESSION: 1. Normal appearance of the inferior vena cava, bilateral iliac vein systems, and lower extremity veins, imaged through the lower calves. No evidence of thrombus, occlusion, or other abnormality. 2. Superficial soft tissue edema throughout the lower extremities. Subcutaneous calcification of the calves, likely related to chronic venous stasis. 3. Hepatomegaly and hepatic steatosis. 4. Nonobstructive bilateral renal calculi. Electronically Signed: By: Delanna Ahmadi M.D. On: 06/26/2022 19:35   CT Angio Chest PE W/Cm &/Or Wo Cm Result Date: 06/26/2022 IMPRESSION: 1. Positive examination for pulmonary embolism, with saddle embolus present as well as bilateral lobar and segmental embolus throughout the lungs. Most proximal occlusive embolus is in the distal left pulmonary artery. 2. Global cardiomegaly with enlargement of the RV LV ratio to 1.5. The main pulmonary artery measures up to 2.9 cm in caliber.  Findings are consistent with right heart strain. These results were called by telephone at the time of interpretation on 06/26/2022 at 7:24 pm to Dr. Shirlyn Goltz who verbally acknowledged these results. Electronically Signed   By: Delanna Ahmadi M.D.   On: 06/26/2022 19:36   DG Chest 2 View  Result Date: 06/26/2022 IMPRESSION: Mild enlargement of cardiac silhouette. No acute infiltrate. Electronically Signed   By: Lavonia Dana M.D.   On: 06/26/2022 17:02   VAS [place] LOWER EXTREMITY VENOUS (DVT) (7a-7p) Result Date: 06/26/2022 Summary: RIGHT: - Findings consistent with acute deep vein thrombosis involving the right common femoral vein, right femoral vein, right popliteal vein, right posterior tibial veins, and right peroneal veins.  - No cystic structure found in the popliteal fossa.  LEFT: - No evidence of common femoral vein obstruction.  *See table(s) above for measurements and observations.    Preliminary      Echocardiogram Report 06/27/22 IMPRESSIONS    1. Left ventricular ejection fraction, by estimation, is 60 to 65%. The  left ventricle has normal function. The left ventricle has no regional  wall motion abnormalities. Left ventricular diastolic parameters are  consistent with Grade II diastolic  dysfunction (pseudonormalization). Elevated left atrial pressure.   2. Right ventricular systolic function was not well visualized. Normal RV  function at base; RV free wall not well visualized. The right ventricular  size is not well visualized. There is moderately elevated pulmonary artery  systolic pressure. The  estimated right ventricular systolic pressure is 63.8 mmHg.   3. The mitral valve is normal in structure. No evidence of mitral valve  regurgitation.   4. The aortic valve was not well visualized. Aortic valve regurgitation  is not visualized. No aortic stenosis is present.   5. The inferior vena cava is dilated in size with <50% respiratory  variability, suggesting right atrial  pressure of 15 mmHg.     Assessment/Plan Principal Problem:   Saddle pulmonary embolus (HCC) Active Problems:   Morbid obesity ([place])   Smoker   HTN (hypertension)   Diabetes (HCC)   Anxiety and depression   DVT (deep venous thrombosis) (HCC)  VASILIY MCCARRY is a 52 y.o. with PMH of Class III Obesity, obstructive sleep apnea, and hx of DMII with hypoglycemia admit for Saddle PE on hospital day 2.   Saddle Pulmonary Embolism + Acute DVT of RLE DVT noted on lower extremity doppler. CTA positive for saddle PE. Right heart strain  suggested by CTA but echo unable to visualize right side due to body habitus. Pt on heparin and will switch to full dose lovenox today. ED consulted PCCM and Dr. Duwayne Heck does not recommend thrombectomy.  Will defer hypercoagulable work up to outpatient hematology. Patient currently satting well on RA.  Pt is very high risk given significant clot burden in the pulmonary vasculature and lower extremity. Fortunately, pt does not have any further clots in the abdomen and pelvis.   DMII Chronic. A1c 7.8 on 03/01/22. CBG around 200. SSI ordered previously. Will switch to resistant scale. Would be a good candidate for GLP 1 agonist but will defer this to PCP.   Intertrigo  2/2 to obesity.  Pt states he does not use anything when the rash clears and then it recurs. Will start Nystatin powder TID.   Tobacco use disorder Chronic. Nicotene patch ordered and counseling provided.   COPD Chronic. On Symbicort at home. Will give dulera while pt is here and switch to symbicort on discharge.   Class III Obesity Chronic. BMI 57. Counseled on weight loss and healthy eating.   Thrombocytopenia Mildly low at 128k. Will continue to monitor. Previously plts around 160ks.   Diet: Carb-Modified IVF: None,None VTE: Heparin Code: Full Prior to Admission Living Arrangement: Home Anticipated Discharge Location:Home  Barriers to Discharge: Medical Stability Dispo: Anticipated  discharge in approximately 1-2 day(s).   Idamae Schuller, MD [place]ie Rung. [facility] Internal Medicine Residency, PGY-2  Pager: [phone] After 5 pm and on weekends: Please call the on-call pager    Attestation -   I have directly reviewed the clinical findings, lab results and imaging studies. I have interviewed and examined the patient and agree with the documentation and management as recorded by the Dr Humphrey Rolls, patient here with massive saddle PE and extensive right lower extremity DVT, placed on heparin for 24 hours and has tolerated well, now symptom-free transition to 24 to 48 hours of Lovenox then will transition to Eliquis, would prefer Lovenox for at least the first few weeks as he is super morbidly obese and Eliquis is not well studied with this BMI.  Will monitor on Lovenox for now.

## 2022-06-28 NOTE — Plan of Care (Signed)
  Problem: Fluid Volume: Goal: Ability to maintain a balanced intake and output will improve Outcome: Progressing   Problem: Health Behavior/Discharge Planning: Goal: Ability to identify and utilize available resources and services will improve Outcome: Progressing   Problem: Skin Integrity: Goal: Risk for impaired skin integrity will decrease Outcome: Progressing

## 2022-06-28 NOTE — Progress Notes (Addendum)
ANTICOAGULATION CONSULT NOTE - Follow Up Consult  Pharmacy Consult for Heparin > full-dose Lovenox > update chg to Eliquis 1/24 Indication: pulmonary embolus and DVT RLE  Allergies  Allergen Reactions   Metformin And Related Diarrhea and Nausea Only    Patient Measurements: Height: 6\' 3"  (190.5 cm) Weight: (!) 207.3 kg (457 lb) IBW/kg (Calculated) : 84.5 Heparin Dosing Weight: 136.1 kg Lovenox Dosing Weight: 207.3 kg  Vital Signs:    Labs: Recent Labs    06/26/22 1559 06/26/22 1559 06/26/22 1748 06/27/22 0215 06/27/22 1100 06/27/22 1800 06/28/22 0045  HGB 15.7  --   --  14.2  --   --  12.6*  HCT 45.8  --   --  41.3  --   --  37.3*  PLT 147*  --   --  129*  --   --  128*  HEPARINUNFRC  --    < >  --  0.24* 0.34 0.37 0.33  CREATININE 0.66  --   --  0.59*  --   --   --   TROPONINIHS 13  --  12  --   --   --   --    < > = values in this interval not displayed.    Estimated Creatinine Clearance: 208.8 mL/min (A) (by C-G formula based on SCr of 0.59 mg/dL (L)).  Assessment: 67 yom with a history of morbid obesity, OSA, and hypoglycemia. Patient is presented 06/26/22 with SOB. Heparin per pharmacy consult on 06/26/22 for pulmonary embolus and RLE DVT.  To transition to SQ Lovenox today.  Heparin level is low therapeutic (0.33) on 2800 units/hr.  Hgb trended down to 12.6, platelet count 147>129>128. No bleeding reported.  Addendum 11:15 am: Patient does not want Lovenox.  To continue heparin drip today and transition to Eliquis on 06/29/12.  2nd addendum 3:30pm: Now changed back to Lovenox and first injection given. To continue Lovenox on 1/23 and change to Eliquis on 1/24.  Goal of Therapy:  Heparin level 0.3-0.7 units/ml Anti-Xa level 0.6-1 units/ml 4hrs after LMWH dose given Monitor platelets by anticoagulation protocol: Yes   Plan:  Lovenox 210 mg (~1 mg/kg) SQ Q12hrs. Stop IV heparin when giving first Lovenox injection. CBC in am. Monitor for signs/symptoms of  bleeding. Follow up for oral anticoagulation plans.  Addendum 11:15am.  Cancel Lovenox. Continue heparin drip > will increase to 2900 units/hr to try to keep at goal. Transition to Eliquis scheduled for 1/23 at 10am. Eliquis 10 mg BID x 1 week, then 5 mg BID. Stop IV heparin when giving first Eliquis dose.  Addendum 3:30pm: Lovenox today and 1/23. Lovenox 210 mg SQ given at 1516. Plan q12h until change to Eliquis on 1/24. Doses scheduled 11 hours apart, to end at midnight on 1/24. - 2am and 1pm on 1/23, then 60midnight Eliquis to begin 1/24 am.  Arty Baumgartner, RPh 06/28/2022,9:07 AM

## 2022-06-29 ENCOUNTER — Other Ambulatory Visit (HOSPITAL_COMMUNITY): Payer: Self-pay

## 2022-06-29 LAB — CBC
HCT: 35.9 % — ABNORMAL LOW (ref 39.0–52.0)
Hemoglobin: 12.3 g/dL — ABNORMAL LOW (ref 13.0–17.0)
MCH: 32.5 pg (ref 26.0–34.0)
MCHC: 34.3 g/dL (ref 30.0–36.0)
MCV: 95 fL (ref 80.0–100.0)
Platelets: 140 10*3/uL — ABNORMAL LOW (ref 150–400)
RBC: 3.78 MIL/uL — ABNORMAL LOW (ref 4.22–5.81)
RDW: 14 % (ref 11.5–15.5)
WBC: 8.8 10*3/uL (ref 4.0–10.5)
nRBC: 0 % (ref 0.0–0.2)

## 2022-06-29 LAB — GLUCOSE, CAPILLARY
Glucose-Capillary: 237 mg/dL — ABNORMAL HIGH (ref 70–99)
Glucose-Capillary: 187 mg/dL — ABNORMAL HIGH (ref 70–99)

## 2022-06-29 MED ORDER — NICOTINE 14 MG/24HR TD PT24
14.0000 mg | MEDICATED_PATCH | Freq: Every day | TRANSDERMAL | 0 refills | Status: DC
  Filled 2022-06-29: qty 28, 28d supply, fill #0

## 2022-06-29 MED ORDER — TRAMADOL HCL 50 MG PO TABS: 50.0000 mg | ORAL_TABLET | Freq: Four times a day (QID) | ORAL | Status: DC | PRN

## 2022-06-29 MED ORDER — APIXABAN 5 MG PO TABS
5.0000 mg | ORAL_TABLET | Freq: Two times a day (BID) | ORAL | 0 refills | Status: DC
  Filled 2022-06-29: qty 72, 30d supply, fill #0

## 2022-06-29 MED ORDER — APIXABAN 5 MG PO TABS: 5.0000 mg | ORAL_TABLET | Freq: Two times a day (BID) | ORAL | Status: DC

## 2022-06-29 MED ORDER — NYSTATIN 100000 UNIT/GM EX POWD
Freq: Three times a day (TID) | CUTANEOUS | 0 refills | Status: DC
  Filled 2022-06-29: qty 30, 15d supply, fill #0

## 2022-06-29 MED ORDER — APIXABAN 5 MG PO TABS
10.0000 mg | ORAL_TABLET | Freq: Two times a day (BID) | ORAL | 0 refills | Status: DC
  Filled 2022-06-29: qty 24, 6d supply, fill #0

## 2022-06-29 MED ORDER — TRAMADOL HCL 50 MG PO TABS
50.0000 mg | ORAL_TABLET | Freq: Four times a day (QID) | ORAL | 0 refills | Status: DC | PRN
  Filled 2022-06-29: qty 10, 3d supply, fill #0

## 2022-06-29 MED ORDER — APIXABAN 5 MG PO TABS: 10.0000 mg | ORAL_TABLET | Freq: Two times a day (BID) | ORAL | Status: DC

## 2022-06-29 MED ORDER — ACETAMINOPHEN 325 MG PO TABS
650.0000 mg | ORAL_TABLET | Freq: Four times a day (QID) | ORAL | 0 refills | Status: DC | PRN
  Filled 2022-06-29: qty 20, 3d supply, fill #0

## 2022-06-29 NOTE — Discharge Instructions (Addendum)
Information on my medicine - ELIQUIS (apixaban)  Why was Eliquis prescribed for you? Eliquis was prescribed to treat blood clots that may have been found in the veins of your legs (deep vein thrombosis) or in your lungs (pulmonary embolism) and to reduce the risk of them occurring again.  What do You need to know about Eliquis ? The starting dose is 10 mg (two 5 mg tablets) taken TWICE daily for the FIRST SEVEN (7) DAYS, then  the dose is reduced to ONE 5 mg tablet taken TWICE daily.  Eliquis may be taken with or without food.   Try to take the dose about the same time in the morning and in the evening. If you have difficulty swallowing the tablet whole please discuss with your pharmacist how to take the medication safely.  Take Eliquis exactly as prescribed and DO NOT stop taking Eliquis without talking to the doctor who prescribed the medication.  Stopping may increase your risk of developing a new blood clot.  Refill your prescription before you run out.  After discharge, you should have regular check-up appointments with your healthcare provider that is prescribing your Eliquis.    What do you do if you miss a dose? If a dose of ELIQUIS is not taken at the scheduled time, take it as soon as possible on the same day and twice-daily administration should be resumed. The dose should not be doubled to make up for a missed dose.  Important Safety Information A possible side effect of Eliquis is bleeding. You should call your healthcare provider right away if you experience any of the following: Bleeding from an injury or your nose that does not stop. Unusual colored urine (red or dark brown) or unusual colored stools (red or black). Unusual bruising for unknown reasons. A serious fall or if you hit your head (even if there is no bleeding).  Some medicines may interact with Eliquis and might increase your risk of bleeding or clotting while on Eliquis. To help avoid this, consult  your healthcare provider or pharmacist prior to using any new prescription or non-prescription medications, including herbals, vitamins, non-steroidal anti-inflammatory drugs (NSAIDs) and supplements.  This website has more information on Eliquis (apixaban): http://www.eliquis.com/eliquis/home    Follow with Primary MD [name], MD in 7 days   Get CBC, CMP  -  checked next visit with your primary MD    Activity: As tolerated with Full fall precautions use walker/cane & assistance as needed  Disposition Home   Diet: Heart Healthy Low Carb  Accuchecks 4 times/day, Once in AM empty stomach and then before each meal. Log in all results and show them to your Prim.MD in 3 days. If any glucose reading is under 80 or above 300 call your Prim MD immidiately. Follow Low glucose instructions for glucose under 80 as instructed.   Special Instructions: If you have smoked or chewed Tobacco  in the last 2 yrs please stop smoking, stop any regular Alcohol  and or any Recreational drug use.  On your next visit with your primary care physician please Get Medicines reviewed and adjusted.  Please request your Prim.MD to go over all Hospital Tests and Procedure/Radiological results at the follow up, please get all Hospital records sent to your Prim MD by signing hospital release before you go home.  If you experience worsening of your admission symptoms, develop shortness of breath, life threatening emergency, suicidal or homicidal thoughts you must seek medical attention immediately by calling 911  or calling your MD immediately  if symptoms less severe.  You Must read complete instructions/literature along with all the possible adverse reactions/side effects for all the Medicines you take and that have been prescribed to you. Take any new Medicines after you have completely understood and accpet all the possible adverse reactions/side effects.

## 2022-06-29 NOTE — Progress Notes (Signed)
PT Cancellation Note  Patient Details Name: Larry Fowler MRN: [id number] DOB: November 28, 1970   Cancelled Treatment:    Reason Eval/Treat Not Completed: Patient declined, reports he has been up earlier on his own and just had morphine for pain and wants to rest.   Rivanna 06/29/2022, 9:24 AM [place] Office [phone]

## 2022-06-29 NOTE — Discharge Summary (Addendum)
Larry Fowler WLN:[id number] DOB: Jun 26, 1970 DOA: 06/26/2022  PCP: Larry Borg, MD  Admit date: 06/26/2022  Discharge date: 06/29/2022  Admitted From: Home   Disposition:  Home   Recommendations for Outpatient Follow-up:   Follow up with PCP in 1-2 weeks  PCP Please obtain BMP/CBC, 2 view CXR in 1week,  (see Discharge instructions)   PCP Please follow up on the following pending results:    Home Health: None   Equipment/Devices: None  Consultations: Admitting team discussed the case with PCCM physician on-call Larry Fowler, no further changes in plan. Discharge Condition: Stable    CODE STATUS: Full    Diet Recommendation: Heart Healthy Low Carb    Chief Complaint  Patient presents with   Shortness of Breath     Brief history of present illness from the day of admission and additional interim summary    52 year old male with past medical history of extreme morbid obesity, obstructive sleep apnea, hypoglycemia. Patient presents with shortness of breath. Monday night he went to bed, he is also short of breath and found he could not catch his breath. Symptoms have been going on for last 5 days. He endorses being sedentary for the last 5-6 months and has been working from home requiring him to sit for prolonged period of time.                                                                  Hospital Course   Saddle Pulmonary Embolism + Acute DVT of RLE DVT noted on lower extremity doppler. CTA positive for saddle PE. Right heart strain suggested by CTA negative troponin, echo shows preserved left-sided EF and RV systolic function, moderate pulmonary hypertension, was kept on IV heparin for 24 hours followed by Lovenox full dose, hemodynamically stable no tachycardia, stable on room air.  Will be transition  to Eliquis, pharmacy consulted pharmacy discussed it with patient in detail, I also discussed the case with hematologist on-call Larry Fowler.  Will be given 1 month coupon for free Eliquis along with medication assistance forms which were filled by pharmacist.   Be discharged home with close outpatient follow-up with PCP, may benefit from one-time outpatient hematology follow-up in 4 to 6 weeks.  His extensive clotting likely is due to underlying morbid obesity and lack of activity at home.  Patient has been counseled.    DMII Chronic. A1c 7.8 on 03/01/22.  Check CBGs q. ACH S follow with PCP for weight loss.   Intertrigo  2/2 to obesity.  Pt states he does not use anything when the rash clears and then it recurs. Will start Nystatin powder TID.    Tobacco use disorder Chronic. Nicotene patch ordered and counseling provided.    COPD Chronic. On Symbicort at home. Will give dulera  while pt is here and switch to symbicort on discharge.    Class III Obesity Chronic. BMI 57. Counseled on weight loss and healthy eating.  Follow-up with PCP.   Thrombocytopenia Mildly low at 140K, acute due to consumption from blood clot burden.  Gradually improving PCP to monitor.  Discharge diagnosis     Principal Problem:   Saddle pulmonary embolus (HCC) Active Problems:   Morbid obesity (HCC)   Smoker   HTN (hypertension)   Diabetes (HCC)   Anxiety and depression   DVT (deep venous thrombosis) [facility])    Discharge instructions    Discharge Instructions     Discharge instructions   Complete by: As directed    Follow with Primary MD Larry Levins, MD in 7 days   Get CBC, CMP  -  checked next visit with your primary MD    Activity: As tolerated with Full fall precautions use walker/cane & assistance as needed  Disposition Home   Diet: Heart Healthy Low Carb  Accuchecks 4 times/day, Once in AM empty stomach and then before each meal. Log in all results and show them to your Prim.MD in 3  days. If any glucose reading is under 80 or above 300 call your Prim MD immidiately. Follow Low glucose instructions for glucose under 80 as instructed.   Special Instructions: If you have smoked or chewed Tobacco  in the last 2 yrs please stop smoking, stop any regular Alcohol  and or any Recreational drug use.  On your next visit with your primary care physician please Get Medicines reviewed and adjusted.  Please request your Prim.MD to go over all Hospital Tests and Procedure/Radiological results at the follow up, please get all Hospital records sent to your Prim MD by signing hospital release before you go home.  If you experience worsening of your admission symptoms, develop shortness of breath, life threatening emergency, suicidal or homicidal thoughts you must seek medical attention immediately by calling 911 or calling your MD immediately  if symptoms less severe.  You Must read complete instructions/literature along with all the possible adverse reactions/side effects for all the Medicines you take and that have been prescribed to you. Take any new Medicines after you have completely understood and accpet all the possible adverse reactions/side effects.   Increase activity slowly   Complete by: As directed        Discharge Medications   Allergies as of 06/29/2022       Reactions   Metformin And Related Diarrhea, Nausea Only        Medication List     STOP taking these medications    aspirin EC 81 MG tablet   clindamycin 300 MG capsule Commonly known as: CLEOCIN       TAKE these medications    acetaminophen 325 MG tablet Commonly known as: TYLENOL Take 2 tablets (650 mg total) by mouth every 6 (six) hours as needed for mild pain or moderate pain.   albuterol 108 (90 Base) MCG/ACT inhaler Commonly known as: Ventolin HFA Inhale 2 puffs into the lungs every 6 (six) hours as needed for wheezing.   apixaban 5 MG Tabs tablet Commonly known as: ELIQUIS Take 2  tablets (10 mg total) by mouth 2 (two) times daily for 6 days.   apixaban 5 MG Tabs tablet Commonly known as: ELIQUIS Take 2 tablets (10 mg total) by mouth 2 (two) times daily for 6 days THEN take 1 tablet (5 mg total) by mouth 2 (two) times  daily thereafter. Start taking on: July 06, 2022   BENADRYL MAXIMUM STRENGTH EX Apply 1 application  topically 2 (two) times daily as needed (irritation of perineum).   NEOSPORIN EX Apply 1 application  topically daily as needed (irritation of perineum).   nicotine 14 mg/24hr patch Commonly known as: NICODERM CQ - dosed in mg/24 hours Place 1 patch (14 mg total) onto the skin daily. Start taking on: June 30, 2022   nystatin powder Commonly known as: MYCOSTATIN/NYSTOP Apply topically 3 (three) times daily. Ply to affected abdominal skin 3 times a day.   pioglitazone 15 MG tablet Commonly known as: Actos Take 1 tablet (15 mg total) by mouth daily.   Symbicort 80-4.5 MCG/ACT inhaler Generic drug: budesonide-formoterol INHALE 2 PUFFS INTO THE LUNGS 2 TIMES DAILY. What changed:  how much to take when to take this reasons to take this   traMADol 50 MG tablet Commonly known as: ULTRAM Take 1 tablet (50 mg total) by mouth every 6 (six) hours as needed for moderate pain or severe pain.         Follow-up Information     Larry LevinsJohn, James W, MD Follow up.   Specialties: Internal Medicine, Radiology Contact information: [address] [place] [address] [phone]         Gwenevere AbbotKhan, Ghalib, MD. Schedule an appointment as soon as possible for a visit in 1 week(s).   Specialty: Internal Medicine Contact information: [address] [place] [address] [phone]                 Major procedures and Radiology Reports - PLEASE review detailed and final reports thoroughly  -       ECHOCARDIOGRAM COMPLETE  Result Date: 06/27/2022    ECHOCARDIOGRAM REPORT   Patient Name:   Lilly CoveRAYMOND S Spiller Date of Exam:  06/27/2022 Medical Rec #:  [id number]         Height:       75.0 in Accession #:    [id number]        Weight:       457.0 lb Date of Birth:  [birth year]        BSA:          3.118 m Patient Age:    51 years          BP:           113/77 mmHg Patient Gender: M                 HR:           68 bpm. Exam Location:  Inpatient Procedure: 2D Echo and Intracardiac Opacification Agent Indications:    pulmonary embolus  History:        Patient has no prior history of Echocardiogram examinations.                 Risk Factors:Hypertension, Diabetes and Current Smoker.  Sonographer:    Cathie HoopsWendy Porter Referring Phys: Heide [id number] Keyondre Hepburn K [facility]  Sonographer Comments: Technically difficult study due to poor echo windows and patient is obese. Image acquisition challenging due to patient body habitus. IMPRESSIONS  1. Left ventricular ejection fraction, by estimation, is 60 to 65%. The left ventricle has normal function. The left ventricle has no regional wall motion abnormalities. Left ventricular diastolic parameters are consistent with Grade II diastolic dysfunction (pseudonormalization). Elevated left atrial pressure.  2. Right ventricular systolic function was not well visualized. Normal RV function at base; RV free wall  not well visualized. The right ventricular size is not well visualized. There is moderately elevated pulmonary artery systolic pressure. The estimated right ventricular systolic pressure is 46.6 mmHg.  3. The mitral valve is normal in structure. No evidence of mitral valve regurgitation.  4. The aortic valve was not well visualized. Aortic valve regurgitation is not visualized. No aortic stenosis is present.  5. The inferior vena cava is dilated in size with <50% respiratory variability, suggesting right atrial pressure of 15 mmHg. FINDINGS  Left Ventricle: Left ventricular ejection fraction, by estimation, is 60 to 65%. The left ventricle has normal function. The left ventricle has no regional wall motion abnormalities.  Definity contrast agent was given IV to delineate the left ventricular  endocardial borders. The left ventricular internal cavity size was normal in size. There is no left ventricular hypertrophy. Left ventricular diastolic parameters are consistent with Grade II diastolic dysfunction (pseudonormalization). Elevated left atrial pressure. Right Ventricle: The right ventricular size is not well visualized. Right vetricular wall thickness was not well visualized. Right ventricular systolic function was not well visualized. There is moderately elevated pulmonary artery systolic pressure. The  tricuspid regurgitant velocity is 2.81 m/s, and with an assumed right atrial pressure of 15 mmHg, the estimated right ventricular systolic pressure is 46.6 mmHg. Left Atrium: Left atrial size was normal in size. Right Atrium: Right atrial size was normal in size. Pericardium: There is no evidence of pericardial effusion. Mitral Valve: The mitral valve is normal in structure. No evidence of mitral valve regurgitation. Tricuspid Valve: The tricuspid valve is normal in structure. Tricuspid valve regurgitation is trivial. Aortic Valve: The aortic valve was not well visualized. Aortic valve regurgitation is not visualized. No aortic stenosis is present. Aortic valve mean gradient measures 5.0 mmHg. Aortic valve peak gradient measures 8.8 mmHg. Aortic valve area, by VTI measures 2.83 cm. Pulmonic Valve: The pulmonic valve was not well visualized. Pulmonic valve regurgitation is not visualized. Aorta: The aortic root and ascending aorta are structurally normal, with no evidence of dilitation. Venous: The inferior vena cava is dilated in size with less than 50% respiratory variability, suggesting right atrial pressure of 15 mmHg. IAS/Shunts: The interatrial septum was not well visualized.  LEFT VENTRICLE PLAX 2D LVIDd:         4.90 cm      Diastology LVIDs:         3.80 cm      LV e' medial:    8.05 cm/s LV PW:         1.00 cm      LV  E/e' medial:  15.2 LV IVS:        1.00 cm      LV e' lateral:   6.31 cm/s LVOT diam:     2.00 cm      LV E/e' lateral: 19.3 LV SV:         88 LV SV Index:   28 LVOT Area:     3.14 cm  LV Volumes (MOD) LV vol d, MOD A2C: 117.0 ml LV vol d, MOD A4C: 140.0 ml LV vol s, MOD A2C: 41.7 ml LV vol s, MOD A4C: 52.3 ml LV SV MOD A2C:     75.3 ml LV SV MOD A4C:     140.0 ml LV SV MOD BP:      82.6 ml RIGHT VENTRICLE RV S prime:     11.50 cm/s TAPSE (M-mode): 2.0 cm LEFT ATRIUM  Index        RIGHT ATRIUM           Index LA diam:      3.40 cm 1.09 cm/m   RA Area:     15.00 cm LA Vol (A2C): 52.0 ml 16.68 ml/m  RA Volume:   35.30 ml  11.32 ml/m LA Vol (A4C): 27.1 ml 8.69 ml/m  AORTIC VALVE                    PULMONIC VALVE AV Area (Vmax):    2.91 cm     PV Vmax:       0.91 m/s AV Area (Vmean):   2.92 cm     PV Peak grad:  3.3 mmHg AV Area (VTI):     2.83 cm AV Vmax:           148.00 cm/s AV Vmean:          99.000 cm/s AV VTI:            0.310 m AV Peak Grad:      8.8 mmHg AV Mean Grad:      5.0 mmHg LVOT Vmax:         137.00 cm/s LVOT Vmean:        91.900 cm/s LVOT VTI:          0.280 m LVOT/AV VTI ratio: 0.90  AORTA Ao Root diam: 3.10 cm Ao Asc diam:  3.00 cm MITRAL VALVE                TRICUSPID VALVE MV Area (PHT): 3.27 cm     TR Peak grad:   31.6 mmHg MV Decel Time: 232 msec     TR Vmax:        281.00 cm/s MV E velocity: 122.00 cm/s MV A velocity: 93.30 cm/s   SHUNTS MV E/A ratio:  1.31         Systemic VTI:  0.28 m                             Systemic Diam: 2.00 cm Epifanio Lescheshristopher Schumann MD Electronically signed by Epifanio Lescheshristopher Schumann MD Signature Date/Time: 06/27/2022/3:43:44 PM    Final    VAS US LOWER EXTREMITY VENOUS (DVT) (7a-7p)  Result Date: 06/27/2022  Lower Venous DVT Study Patient Name:  Lilly CoveRAYMOND S Zacharia  Date of Exam:   06/26/2022 Medical Rec #: [id number]          Accession #:    [id number] Date of Birth: [birth year]         Patient Gender: M Patient Age:   [age] years [facility] Procedure:      VAS US LOWER EXTREMITY VENOUS (DVT) Referring Phys: HALEY SAGE --------------------------------------------------------------------------------  Indications: Right lower extremity pain and swelling with associated shortness of breath. Other Indications: Patient with bilateral brawny discoloration of the calves. Comparison Study: 08-02-2018 Prior bilateral lower extremity venous study was                   negative for DVT. Performing Technologist: Jean Rosenthalachel Hodge RDMS, RVT  Examination Guidelines: A complete evaluation includes B-mode imaging, spectral Doppler, color Doppler, and power Doppler as needed of all accessible portions of each vessel. Bilateral testing is considered an integral part of a complete examination. Limited examinations for reoccurring indications may be performed as noted. The reflux portion of the exam is performed with the  patient in reverse Trendelenburg.  +---------+---------------+---------+-----------+----------+--------------+ RIGHT    CompressibilityPhasicitySpontaneityPropertiesThrombus Aging +---------+---------------+---------+-----------+----------+--------------+ CFV      Partial        Yes      Yes                  Acute          +---------+---------------+---------+-----------+----------+--------------+ SFJ      Full                                                        +---------+---------------+---------+-----------+----------+--------------+ FV Prox  None           No       No                   Acute          +---------+---------------+---------+-----------+----------+--------------+ FV Mid   None           No       No                   Acute          +---------+---------------+---------+-----------+----------+--------------+ FV DistalNone           No       No                   Acute          +---------+---------------+---------+-----------+----------+--------------+ PFV      Full                                                         +---------+---------------+---------+-----------+----------+--------------+ POP      None           No       No                   Acute          +---------+---------------+---------+-----------+----------+--------------+ PTV      None           No       No                   Acute          +---------+---------------+---------+-----------+----------+--------------+ PERO     None           No       No                   Acute          +---------+---------------+---------+-----------+----------+--------------+   +----+---------------+---------+-----------+----------+--------------+ LEFTCompressibilityPhasicitySpontaneityPropertiesThrombus Aging +----+---------------+---------+-----------+----------+--------------+ CFV Full           Yes      Yes                                 +----+---------------+---------+-----------+----------+--------------+    Summary: RIGHT: - Findings consistent with acute deep vein thrombosis involving the right common femoral vein, right femoral vein, right popliteal vein, right posterior tibial veins, and right peroneal veins.  - No cystic structure found in  the popliteal fossa.  LEFT: - No evidence of common femoral vein obstruction.  *See table(s) above for measurements and observations. Electronically signed by Coral Else MD on 06/27/2022 at 2:15:50 PM.    Final    CT VENOGRAM ABDOMEN PELVIS  Addendum Date: 06/26/2022   ADDENDUM REPORT: 06/26/2022 19:44 ADDENDUM: Comparison is made to same day ultrasound examination of the bilateral lower extremities performed by vascular lab; reported deep venous thrombus of the right lower extremity is not well appreciated by CT venogram. Ultrasound is generally the test of choice for the evaluation of lower extremity deep venous thrombosis. Electronically Signed   By: Jearld Lesch M.D.   On: 06/26/2022 19:44   Result Date: 06/26/2022 CLINICAL DATA:  Deep venous thrombosis EXAM: CT  VENOGRAM ABDOMEN AND PELVIS TECHNIQUE: CT venographic examination of the abdomen, pelvis, and lower extremities was performed following the administration of intravenous contrast. RADIATION DOSE REDUCTION: This exam was performed according to the departmental dose-optimization program which includes automated exposure control, adjustment of the mA and/or kV according to patient size and/or use of iterative reconstruction technique. CONTRAST:  OMNIPAQUE IOHEXOL 350 MG/ML SOLN COMPARISON:  None Available. FINDINGS: CT VENOGRAM Normal appearance of the inferior vena cava, bilateral iliac vein systems, and lower extremity veins, imaged through the lower calves. No evidence of thrombus, occlusion, or other abnormality. Superficial varicosities about the lower extremities. CT ABDOMEN, PELVIS, AND LOWER EXTREMITIES Lower chest: Please see separately reported examination of the chest. Hepatobiliary: No solid liver abnormality is seen. Hepatomegaly, maximum coronal span 22.9 cm. Hepatic steatosis. No gallstones, gallbladder wall thickening, or biliary dilatation. Pancreas: Unremarkable. No pancreatic ductal dilatation or surrounding inflammatory changes. Spleen: Normal in size without significant abnormality. Adrenals/Urinary Tract: Adrenal glands are unremarkable. Nonobstructive bilateral renal calculi. No hydronephrosis. Bladder is unremarkable. Stomach/Bowel: Stomach is within normal limits. Appendix appears normal. No evidence of bowel wall thickening, distention, or inflammatory changes. Lymphatic: No enlarged abdominal or pelvic lymph nodes. Reproductive: No mass or other significant abnormality. Other: Small, fat containing left inguinal hernia.  No ascites. Musculoskeletal: No acute or significant osseous findings. Lower extremities: Superficial soft tissue edema throughout the lower extremities. Subcutaneous calcification of the calves, likely related to chronic venous stasis. IMPRESSION: 1. Normal appearance  of the inferior vena cava, bilateral iliac vein systems, and lower extremity veins, imaged through the lower calves. No evidence of thrombus, occlusion, or other abnormality. 2. Superficial soft tissue edema throughout the lower extremities. Subcutaneous calcification of the calves, likely related to chronic venous stasis. 3. Hepatomegaly and hepatic steatosis. 4. Nonobstructive bilateral renal calculi. Electronically Signed: By: Jearld Lesch M.D. On: 06/26/2022 19:35   CT Angio Chest PE W/Cm &/Or Wo Cm  Result Date: 06/26/2022 CLINICAL DATA:  PE suspected EXAM: CT ANGIOGRAPHY CHEST WITH CONTRAST TECHNIQUE: Multidetector CT imaging of the chest was performed using the standard protocol during bolus administration of intravenous contrast. Multiplanar CT image reconstructions and MIPs were obtained to evaluate the vascular anatomy. RADIATION DOSE REDUCTION: This exam was performed according to the departmental dose-optimization program which includes automated exposure control, adjustment of the mA and/or kV according to patient size and/or use of iterative reconstruction technique. CONTRAST:  OMNIPAQUE IOHEXOL 350 MG/ML SOLN COMPARISON:  None Available. FINDINGS: Cardiovascular: Satisfactory opacification of the pulmonary arteries to the segmental level. Positive examination for pulmonary embolism, with saddle embolus present as well as bilateral lobar and segmental embolus throughout the lungs. Most proximal occlusive embolus is in the distal left pulmonary artery (series 8, image  46). Global cardiomegaly with enlargement of the RV LV ratio to 1.5. The main pulmonary artery measures up to 2.9 cm in caliber. No pericardial effusion. Mediastinum/Nodes: No enlarged mediastinal, hilar, or axillary lymph nodes. Thyroid gland, trachea, and esophagus demonstrate no significant findings. Lungs/Pleura: Lungs are clear. No pleural effusion or pneumothorax. Upper Abdomen: Please see separately reported examination  of the abdomen and pelvis. Musculoskeletal: No chest wall abnormality. No acute osseous findings. Review of the MIP images confirms the above findings. IMPRESSION: 1. Positive examination for pulmonary embolism, with saddle embolus present as well as bilateral lobar and segmental embolus throughout the lungs. Most proximal occlusive embolus is in the distal left pulmonary artery. 2. Global cardiomegaly with enlargement of the RV LV ratio to 1.5. The main pulmonary artery measures up to 2.9 cm in caliber. Findings are consistent with right heart strain. These results were called by telephone at the time of interpretation on 06/26/2022 at 7:24 pm to Dr. Chaney Malling who verbally acknowledged these results. Electronically Signed   By: Jearld Lesch M.D.   On: 06/26/2022 19:36   DG Chest 2 View  Result Date: 06/26/2022 CLINICAL DATA:  Shortness of breath, chest pain EXAM: CHEST - 2 VIEW COMPARISON:  02/22/2007 FINDINGS: Enlargement of cardiac silhouette. Mediastinal contours and pulmonary vascularity normal. Lungs clear. No pulmonary infiltrate, pleural effusion, or pneumothorax. Osseous structures unremarkable. IMPRESSION: Mild enlargement of cardiac silhouette. No acute infiltrate. Electronically Signed   By: Ulyses Southward M.D.   On: 06/26/2022 17:02       Today   Subjective    Beuford Garcilazo today has no headache,no chest abdominal pain,no new weakness tingling or numbness, feels much better    Objective   Blood pressure 118/67, pulse 62, temperature 98.4 F (36.9 C), temperature source Oral, resp. rate 20, height 6\' 3"  (1.905 m), weight (!) 207.3 kg, SpO2 98 %.   Intake/Output Summary (Last 24 hours) at 06/29/2022 1000 Last data filed at 06/28/2022 1946 Gross per 24 hour  Intake 583.18 ml  Output 300 ml  Net 283.18 ml    Exam  Awake Alert, No new F.N deficits,    [place].AT,PERRAL Supple Neck,   Symmetrical Chest wall movement, Good air movement bilaterally, CTAB RRR,No Gallops,   +ve  B.Sounds, Abd Soft, Non tender,  R leg swollen as compared to the left   Data Review   Recent Labs  Lab 06/26/22 1559 06/27/22 0215 06/28/22 0045 06/29/22 0304  WBC 13.2* 12.3* 9.9 8.8  HGB 15.7 14.2 12.6* 12.3*  HCT 45.8 41.3 37.3* 35.9*  PLT 147* 129* 128* 140*  MCV 93.7 94.3 94.9 95.0  MCH 32.1 32.4 32.1 32.5  MCHC 34.3 34.4 33.8 34.3  RDW 13.6 13.7 13.7 14.0  LYMPHSABS 1.9  --   --   --   MONOABS 0.8  --   --   --   EOSABS 0.2  --   --   --   BASOSABS 0.1  --   --   --     Recent Labs  Lab 06/26/22 1548 06/26/22 1559 06/27/22 0215  NA  --  135 133*  K  --  3.9 3.8  CL  --  99 100  CO2  --  23 22  ANIONGAP  --  13 11  GLUCOSE  --  243* 228*  BUN  --  10 10  CREATININE  --  0.66 0.59*  BNP 141.3*  --   --   CALCIUM  --  9.3  8.9     Total Time in preparing paper work, data evaluation and todays exam - 35 minutes  Signature  -    Lala Lund M.D on 06/29/2022 at 10:00 AM   -  To page go to [email or web address]

## 2022-06-29 NOTE — TOC Transition Note (Signed)
Transition of Care (TOC) - CM/SW Discharge Note Marvetta Gibbons RN, BSN Transitions of Care Unit 4E- RN Case Manager See Treatment Team for direct phone #   Patient Details  Name: Larry Fowler MRN: [id number] Date of Birth: 07-May-1971  Transition of Care [facility]) CM/SW Contact:  Dawayne Patricia, RN Phone Number: 06/29/2022, 11:56 AM   Clinical Narrative:    Pt stable for transition home today. Has been started on Eliquis- TOC pharmacy to provide med for discharge using 30 day coupon, pharmD has seen pt and completed pt assistance application and faxed for further Eliquis assistance.   CM spoke with pt at bedside- pt has his own RW at bedside. Lives with wife, per pt wife will transport home this afternoon.  Discussed with pt PCP needs- per pt he plans to continue to see Dr. Cathlean Cower.  Pt voiced that he is in the process of applying for disability, discussed applying for Medicaid as well with DSS. Questions answered that pt had, pt attempted to call wife to see if she had any questions however she did not answer- provided pt my name for contact in case wife had questions.   TOC has delivered meds to bedside.  No further TOC needs noted at this time.     Final next level of care: Home/Self Care Barriers to Discharge: No Barriers Identified   Patient Goals and CMS Choice   Choice offered to / list presented to : NA  Discharge Placement               Home          Discharge Plan and Services Additional resources added to the After Visit Summary for     Discharge Planning Services: CM Consult, Medication Assistance Post Acute Care Choice: NA          DME Arranged: N/A DME Agency: NA       HH Arranged: NA HH Agency: NA        Social Determinants of Health (SDOH) Interventions SDOH Screenings   Depression (PHQ2-9): Low Risk  (12/28/2019)  Tobacco Use: Medium Risk (06/26/2022)     Readmission Risk Interventions    06/29/2022   11:55 AM  Readmission  Risk Prevention Plan  Post Dischage Appt Complete  Medication Screening Complete  Transportation Screening Complete

## 2022-06-29 NOTE — Progress Notes (Signed)
Patient given discharge instructions, medication list and follow up appointments. IV and tele were dcd. All questions were answered at this time. TOC medications will be delivered to patient prior to discharge. Patient verbalized understanding of this at this time. Patient will be discharge home as ordered. Wallace Gappa, Bettina Gavia RN

## 2022-06-30 ENCOUNTER — Telehealth: Payer: Self-pay

## 2022-06-30 NOTE — Telephone Encounter (Signed)
Transition Care Management Follow-up Telephone Call Date of discharge and from where: [facility] 06-29-22 Dx: saddle PE How have you been since you were released from the hospital? Had a little SHOB overnight but overall ok  Any questions or concerns? No  Items Reviewed: Did the pt receive and understand the discharge instructions provided? yes Medications obtained and verified? yes Other? No  Any new allergies since your discharge? No  Dietary orders reviewed? Yes Do you have support at home? Yes   Home Care and Equipment/Supplies: Were home health services ordered? no If so, what is the name of the agency? na  Has the agency set up a time to come to the patient's home? not applicable Were any new equipment or medical supplies ordered?  No What is the name of the medical supply agency? na Were you able to get the supplies/equipment? not applicable Do you have any questions related to the use of the equipment or supplies? No  Functional Questionnaire: (I = Independent and D = Dependent) ADLs: I  Bathing/Dressing- I  Meal Prep- I  Eating- I  Maintaining continence- I  Transferring/Ambulation- I  Managing Meds- I  Follow up appointments reviewed:  PCP Hospital f/u appt confirmed? Yes  [facility]? No . Are transportation arrangements needed? No  If their condition worsens, is the pt aware to call PCP or go to the Emergency Dept.? Yes Was the patient provided with contact information for the PCP's office or ED? Yes Was to pt encouraged to call back with questions or concerns? Yes   Juanda Crumble LPN [place] [facility] [phone]

## 2022-07-07 ENCOUNTER — Ambulatory Visit (INDEPENDENT_AMBULATORY_CARE_PROVIDER_SITE_OTHER): Payer: Self-pay | Admitting: Internal Medicine

## 2022-07-07 ENCOUNTER — Encounter: Payer: Self-pay | Admitting: Internal Medicine

## 2022-07-07 VITALS — BP 142/90 | HR 75 | Temp 97.5°F | Ht 75.0 in | Wt >= 6400 oz

## 2022-07-07 DIAGNOSIS — I2692 Saddle embolus of pulmonary artery without acute cor pulmonale: Secondary | ICD-10-CM

## 2022-07-07 DIAGNOSIS — I10 Essential (primary) hypertension: Secondary | ICD-10-CM

## 2022-07-07 DIAGNOSIS — J069 Acute upper respiratory infection, unspecified: Secondary | ICD-10-CM

## 2022-07-07 DIAGNOSIS — Z87442 Personal history of urinary calculi: Secondary | ICD-10-CM | POA: Insufficient documentation

## 2022-07-07 DIAGNOSIS — E1165 Type 2 diabetes mellitus with hyperglycemia: Secondary | ICD-10-CM

## 2022-07-07 DIAGNOSIS — J4530 Mild persistent asthma, uncomplicated: Secondary | ICD-10-CM

## 2022-07-07 LAB — HEPATIC FUNCTION PANEL
ALT: 28 U/L (ref 0–53)
AST: 28 U/L (ref 0–37)
Albumin: 4.5 g/dL (ref 3.5–5.2)
Alkaline Phosphatase: 90 U/L (ref 39–117)
Bilirubin, Direct: 0.1 mg/dL (ref 0.0–0.3)
Total Bilirubin: 0.5 mg/dL (ref 0.2–1.2)
Total Protein: 7.6 g/dL (ref 6.0–8.3)

## 2022-07-07 LAB — CBC WITH DIFFERENTIAL/PLATELET
Basophils Absolute: 0.1 10*3/uL (ref 0.0–0.1)
Basophils Relative: 0.7 % (ref 0.0–3.0)
Eosinophils Absolute: 0.5 10*3/uL (ref 0.0–0.7)
Eosinophils Relative: 5.6 % — ABNORMAL HIGH (ref 0.0–5.0)
HCT: 47.6 % (ref 39.0–52.0)
Hemoglobin: 16.1 g/dL (ref 13.0–17.0)
Lymphocytes Relative: 29.4 % (ref 12.0–46.0)
Lymphs Abs: 2.7 10*3/uL (ref 0.7–4.0)
MCHC: 33.8 g/dL (ref 30.0–36.0)
MCV: 94.3 fl (ref 78.0–100.0)
Monocytes Absolute: 0.6 10*3/uL (ref 0.1–1.0)
Monocytes Relative: 6.2 % (ref 3.0–12.0)
Neutro Abs: 5.4 10*3/uL (ref 1.4–7.7)
Neutrophils Relative %: 58.1 % (ref 43.0–77.0)
Platelets: 219 10*3/uL (ref 150.0–400.0)
RBC: 5.04 Mil/uL (ref 4.22–5.81)
RDW: 14.5 % (ref 11.5–15.5)
WBC: 9.3 10*3/uL (ref 4.0–10.5)

## 2022-07-07 LAB — BASIC METABOLIC PANEL WITH GFR
BUN: 13 mg/dL (ref 6–23)
CO2: 29 meq/L (ref 19–32)
Calcium: 9.8 mg/dL (ref 8.4–10.5)
Chloride: 97 meq/L (ref 96–112)
Creatinine, Ser: 0.62 mg/dL (ref 0.40–1.50)
GFR: 111.62 mL/min
Glucose, Bld: 217 mg/dL — ABNORMAL HIGH (ref 70–99)
Potassium: 4 meq/L (ref 3.5–5.1)
Sodium: 135 meq/L (ref 135–145)

## 2022-07-07 LAB — HEMOGLOBIN A1C: Hgb A1c MFr Bld: 9 % — ABNORMAL HIGH (ref 4.6–6.5)

## 2022-07-07 MED ORDER — APIXABAN 5 MG PO TABS: 5.0000 mg | ORAL_TABLET | Freq: Two times a day (BID) | ORAL | 3 refills | Status: AC

## 2022-07-07 MED ORDER — AZITHROMYCIN 250 MG PO TABS: ORAL_TABLET | ORAL | 1 refills | Status: AC

## 2022-07-07 NOTE — Assessment & Plan Note (Signed)
Lab Results  Component Value Date   HGBA1C 9.0 (H) 07/07/2022   Uncontrolled, cont wt loss, pt to continue current medical treatment actos 15 qd, decline other change today

## 2022-07-07 NOTE — Assessment & Plan Note (Signed)
Stable, cont inhaler prn

## 2022-07-07 NOTE — Patient Instructions (Addendum)
Please take all new medication as prescribed - the antibiotic  Please continue all other medications as before, and refills have been done if requested - the eliquis  Please have the pharmacy call with any other refills you may need.  Please continue your efforts at being more active, low cholesterol diet, and weight control.  Please keep your appointments with your specialists as you may have planned  Please make an Appointment to return in 3 months, or sooner if needed

## 2022-07-07 NOTE — Assessment & Plan Note (Signed)
Large, now for lifelong eliquis 5 bid - refill done

## 2022-07-07 NOTE — Progress Notes (Signed)
Patient ID: Larry Fowler, male   DOB: [birth year], 52 y.o.   MRN: [id number]        Chief Complaint: follow up recent hospn 1/20 to 1/23       HPI:  Larry Fowler is a 52 y.o. 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  Has had fleeting chest pain and mild doe.  Also  Here with 2-3 days acute onset fever, facial pain, pressure, headache, general weakness and malaise, and greenish d/c, with mild ST and cough, and left neck is tender LA.  Lost wt over 20 lbs with better diet and recent illness.    Wt Readings from Last 3 Encounters:  07/07/22 (!) 447 lb 2 oz (202.8 kg)  06/26/22 (!) 457 lb (207.3 kg)  03/01/22 (!) 468 lb (212.3 kg)   BP Readings from Last 3 Encounters:  07/07/22 (!) 142/90  06/29/22 118/67  03/01/22 124/80         Past Medical History:  Diagnosis Date   ALLERGIC RHINITIS 09/12/2007   Qualifier: Diagnosis of  By: Jenny Reichmann MD, Hunt Oris    ANXIETY 02/16/2007   Qualifier: Diagnosis of  By: Larose Kells     ASTHMA 12/17/2009   Qualifier: Diagnosis of  By: Jenny Reichmann MD, Hunt Oris    Cannabis abuse    currently   Chills with fever    DEPRESSION 09/12/2007   Qualifier: Diagnosis of  By: Jenny Reichmann MD, Hunt Oris    History of cocaine use    in his 20's   Hyperglycemia    Rectal fistula 2009   Rectal pain    Renal stone    2008   Past Surgical History:  Procedure Laterality Date   FINGER SURGERY  2008/09?   partial amputation of 3rd finger right hand, and reattachment of right index finger - Dr Sharol Given   knee surgury     ? which knee per Dr Noemi Chapel in his teens   REPAIR QUADRICEPS/HAMSTRING MUSCLES Bilateral 07/21/2018   Procedure: REPAIR BILATERAL QUADRICEPS;  Surgeon: Leandrew Koyanagi, MD;  Location: [facility];  Service: Orthopedics;  Laterality: Bilateral;   TONSILLECTOMY      reports that he quit smoking about 11 years ago. His smoking use  included cigarettes. He smoked an average of .25 packs per day. He has never used smokeless tobacco. He reports current alcohol use. He reports current drug use. Drug: Marijuana. family history includes Diabetes type II in his mother; Pneumonia in his father. Allergies  Allergen Reactions   Metformin And Related Diarrhea and Nausea Only   Current Outpatient Medications on File Prior to Visit  Medication Sig Dispense Refill   acetaminophen (TYLENOL) 325 MG tablet Take 2 tablets (650 mg total) by mouth every 6 (six) hours as needed for mild pain or moderate pain. 20 tablet 0   albuterol (VENTOLIN HFA) 108 (90 Base) MCG/ACT inhaler Inhale 2 puffs into the lungs every 6 (six) hours as needed for wheezing. 18 g 5   Bacitracin-Polymyxin B (NEOSPORIN EX) Apply 1 application  topically daily as needed (irritation of perineum).     diphenhydrAMINE HCl (BENADRYL MAXIMUM STRENGTH EX) Apply 1 application  topically 2 (two) times daily as needed (irritation of perineum).     nicotine (NICODERM CQ - DOSED IN MG/24 HOURS) 14 mg/24hr patch Place 1 patch (14 mg  total) onto the skin daily. 28 patch 0   nystatin (MYCOSTATIN/NYSTOP) powder Apply topically to abdominal skin 3 (three) times daily. 30 g 0   pioglitazone (ACTOS) 15 MG tablet Take 1 tablet (15 mg total) by mouth daily. 90 tablet 3   SYMBICORT 80-4.5 MCG/ACT inhaler INHALE 2 PUFFS INTO THE LUNGS 2 TIMES DAILY. (Patient taking differently: Inhale 1 puff into the lungs daily as needed (wheezing, shortness of breath).) 10.2 g 6   traMADol (ULTRAM) 50 MG tablet Take 1 tablet (50 mg total) by mouth every 6 (six) hours as needed for moderate pain or severe pain. 10 tablet 0   No current facility-administered medications on file prior to visit.        ROS:  All others reviewed and negative.  Objective        PE:  BP (!) 142/90   Pulse 75   Temp (!) 97.5 F (36.4 C) (Temporal)   Ht 6\' 3"  (1.905 m)   Wt (!) 447 lb 2 oz (202.8 kg)   SpO2 97%   BMI 55.89  kg/m                 Constitutional: Pt appears in NAD               HENT: Head: NCAT.                Right Ear: External ear normal.                 Left Ear: External ear normal.                Eyes: . Pupils are equal, round, and reactive to light. Conjunctivae and EOM are normal               Nose: without d/c or deformity               Neck: Neck supple. Gross normal ROM               Cardiovascular: Normal rate and regular rhythm.                 Pulmonary/Chest: Effort normal and breath sounds without rales or wheezing.                Abd:  Soft, NT, ND, + BS, no organomegaly               Neurological: Pt is alert. At baseline orientation, motor grossly intact               Skin: Skin is warm. No rashes, no other new lesions, LE edema - none               Psychiatric: Pt behavior is normal without agitation   Micro: none  Cardiac tracings I have personally interpreted today:  none  Pertinent Radiological findings (summarize): none   Lab Results  Component Value Date   WBC 9.3 07/07/2022   HGB 16.1 07/07/2022   HCT 47.6 07/07/2022   PLT 219.0 07/07/2022   GLUCOSE 217 (H) 07/07/2022   CHOL 179 12/28/2019   TRIG 123 12/28/2019   HDL 48 12/28/2019   LDLCALC 108 (H) 12/28/2019   ALT 28 07/07/2022   AST 28 07/07/2022   NA 135 07/07/2022   K 4.0 07/07/2022   CL 97 07/07/2022   CREATININE 0.62 07/07/2022   BUN 13 07/07/2022   CO2 29 07/07/2022   TSH 2.62 12/28/2019  PSA <0.1 12/28/2019   HGBA1C 9.0 (H) 07/07/2022   MICROALBUR 1.6 12/26/2018   Assessment/Plan:  Larry Fowler is a 52 y.o. White or Caucasian [1] male with  has a past medical history of ALLERGIC RHINITIS (09/12/2007), ANXIETY (02/16/2007), ASTHMA (12/17/2009), Cannabis abuse, Chills with fever, DEPRESSION (09/12/2007), History of cocaine use, Hyperglycemia, Rectal fistula (2009), Rectal pain, and Renal stone.  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  Cathlean Cower, MD 07/07/2022 8:39 PM [facility]

## 2022-07-07 NOTE — Assessment & Plan Note (Signed)
Mild to mod, possibly occurred onset at hospn, for antibx course zpack,  to f/u any worsening symptoms or concerns

## 2022-07-07 NOTE — Assessment & Plan Note (Signed)
BP Readings from Last 3 Encounters:  07/07/22 (!) 142/90  06/29/22 118/67  03/01/22 124/80   Mild uncontrolled, likely sitautional,, pt to continue medical treatment  - diet, wt control, declines other med tx for now

## 2022-07-08 ENCOUNTER — Other Ambulatory Visit (HOSPITAL_COMMUNITY): Payer: Self-pay

## 2022-07-08 ENCOUNTER — Other Ambulatory Visit: Payer: Self-pay | Admitting: Internal Medicine

## 2022-07-08 ENCOUNTER — Encounter: Payer: Self-pay | Admitting: Internal Medicine

## 2022-07-08 MED ORDER — PIOGLITAZONE HCL 30 MG PO TABS
30.0000 mg | ORAL_TABLET | Freq: Every day | ORAL | 3 refills | Status: DC
  Filled 2022-07-08 – 2022-08-16 (×3): qty 90, 90d supply, fill #0

## 2022-07-08 NOTE — Telephone Encounter (Signed)
Spoke with pt by phone regarding results and is concerned about his A1C being so high as he has cut back on sugar

## 2022-07-08 NOTE — Telephone Encounter (Signed)
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  Ok to increase the actos to 30 mg but also to be more active as we discussed at his visit.

## 2022-07-14 ENCOUNTER — Telehealth: Payer: Self-pay | Admitting: Internal Medicine

## 2022-07-14 NOTE — Telephone Encounter (Signed)
No not really, but I hear [facility] has single compression stockings for $4 per sock

## 2022-07-14 NOTE — Telephone Encounter (Signed)
Pt called wanted to know what kind of compression socks is recommended for him.  Please give pt call back with advice.

## 2022-07-14 NOTE — Telephone Encounter (Signed)
Per patient request, is there a specific brand or kind of compression socks that you recommend?

## 2022-07-19 ENCOUNTER — Other Ambulatory Visit (HOSPITAL_COMMUNITY): Payer: Self-pay

## 2022-08-06 ENCOUNTER — Ambulatory Visit: Payer: Self-pay | Admitting: Internal Medicine

## 2022-08-16 ENCOUNTER — Other Ambulatory Visit: Payer: Self-pay

## 2022-08-16 ENCOUNTER — Other Ambulatory Visit (HOSPITAL_COMMUNITY): Payer: Self-pay

## 2022-08-19 ENCOUNTER — Ambulatory Visit (INDEPENDENT_AMBULATORY_CARE_PROVIDER_SITE_OTHER): Payer: Self-pay | Admitting: Adult Health

## 2022-08-19 VITALS — BP 160/100 | HR 85 | Temp 98.2°F | Ht 75.0 in | Wt >= 6400 oz

## 2022-08-19 DIAGNOSIS — S76301A Unspecified injury of muscle, fascia and tendon of the posterior muscle group at thigh level, right thigh, initial encounter: Secondary | ICD-10-CM

## 2022-08-19 NOTE — Progress Notes (Addendum)
Subjective:    Patient ID: Larry Fowler, male    DOB: May 03, 1971, 52 y.o.   MRN: [id number]  HPI 52 year old male who  has a past medical history of ALLERGIC RHINITIS (09/12/2007), ANXIETY (02/16/2007), ASTHMA (12/17/2009), Cannabis abuse, Chills with fever, DEPRESSION (09/12/2007), History of cocaine use, Hyperglycemia, Rectal fistula (2009), Rectal pain, and Renal stone.  He is a patient of Cathlean Cower who I am seeing today for the first time.   He has a history of saddle PE and right leg DVT involving the right  common femoral vein, right femoral vein, right popliteal vein, right posterior tibial veins, and right peroneal veins in January 2024   While in the hospital he was told to be more active with low impact exercises in order to aid him in losing weight.  He has been doing low impact exercises at home but was not noticing any improvement so he got a gym membership and has been going multiple times throughout the week for the last few weeks.  His exercises have been mostly low impact with elliptical and stretching exercises.  The owner of the gym that he goes to gifted him with a personal trainer and 7 days ago he was doing exercises with his personal trainer including stretching and doing the elliptical, few days later he started to develop pain in the back of his right leg.  Has had some swelling and pain ever since. Pain is felt as a " hamstring pull"   Is fearful that he has had a recurrence of DVT in his right leg.  He is taking his Eliquis 5 mg twice daily.  Pain is worse with walking, certain movements, and stretching.  Has been no noticeable redness or warmth to the area in question.  Did take a Tylenol last night and this seemed to help with his discomfort.  He denies chest pain or shortness of breath.    Review of Systems See HPI   Past Medical History:  Diagnosis Date   ALLERGIC RHINITIS 09/12/2007   Qualifier: Diagnosis of  By: Jenny Reichmann MD, Hunt Oris    ANXIETY 02/16/2007    Qualifier: Diagnosis of  By: Reatha Armour, Lucy     ASTHMA 12/17/2009   Qualifier: Diagnosis of  By: Jenny Reichmann MD, Hunt Oris    Cannabis abuse    currently   Chills with fever    DEPRESSION 09/12/2007   Qualifier: Diagnosis of  By: Jenny Reichmann MD, Hunt Oris    History of cocaine use    in his 20's   Hyperglycemia    Rectal fistula 2009   Rectal pain    Renal stone    2008    Social History   Socioeconomic History   Marital status: Married    Spouse name: Not on file   Number of children: Not on file   Years of education: Not on file   Highest education level: Not on file  Occupational History    Employer: [employer]  Tobacco Use   Smoking status: Former    Packs/day: .25    Types: Cigarettes    Quit date: 03/01/2011    Years since quitting: 11.4   Smokeless tobacco: Never   Tobacco comments:    doesn't smoke the whole cig, burns out while playing video game  Substance and Sexual Activity   Alcohol use: Yes   Drug use: Yes    Types: Marijuana   Sexual activity: Not on file  Other Topics  Concern   Not on file  Social History Narrative   Married wife is Writer  - [employer]   Marijuana user, former smoker, some EtOH   Social Determinants of Health   Financial Resource Strain: Not on file  Food Insecurity: Not on file  Transportation Needs: Not on file  Physical Activity: Not on file  Stress: Not on file  Social Connections: Not on file  Intimate Partner Violence: Not on file    Past Surgical History:  Procedure Laterality Date   FINGER SURGERY  2008/09?   partial amputation of 3rd finger right hand, and reattachment of right index finger - Dr Sharol Given   knee surgury     ? which knee per Dr Noemi Chapel in his teens   REPAIR QUADRICEPS/HAMSTRING MUSCLES Bilateral 07/21/2018   Procedure: REPAIR BILATERAL QUADRICEPS;  Surgeon: Leandrew Koyanagi, MD;  Location: [facility];  Service: Orthopedics;  Laterality: Bilateral;   TONSILLECTOMY      Family  History  Problem Relation Age of Onset   Diabetes type II Mother    Pneumonia Father     Allergies  Allergen Reactions   Metformin And Related Diarrhea and Nausea Only    Current Outpatient Medications on File Prior to Visit  Medication Sig Dispense Refill   acetaminophen (TYLENOL) 325 MG tablet Take 2 tablets (650 mg total) by mouth every 6 (six) hours as needed for mild pain or moderate pain. 20 tablet 0   albuterol (VENTOLIN HFA) 108 (90 Base) MCG/ACT inhaler Inhale 2 puffs into the lungs every 6 (six) hours as needed for wheezing. 18 g 5   apixaban (ELIQUIS) 5 MG TABS tablet Take 2 tablets (10 mg total) by mouth 2 (two) times daily for 6 days THEN take 1 tablet (5 mg total) by mouth 2 (two) times daily thereafter. 180 tablet 3   Bacitracin-Polymyxin B (NEOSPORIN EX) Apply 1 application  topically daily as needed (irritation of perineum).     diphenhydrAMINE HCl (BENADRYL MAXIMUM STRENGTH EX) Apply 1 application  topically 2 (two) times daily as needed (irritation of perineum).     nystatin (MYCOSTATIN/NYSTOP) powder Apply topically to abdominal skin 3 (three) times daily. 30 g 0   pioglitazone (ACTOS) 30 MG tablet Take 1 tablet (30 mg total) by mouth daily. 90 tablet 3   SYMBICORT 80-4.5 MCG/ACT inhaler INHALE 2 PUFFS INTO THE LUNGS 2 TIMES DAILY. (Patient taking differently: Inhale 1 puff into the lungs daily as needed (wheezing, shortness of breath).) 10.2 g 6   traMADol (ULTRAM) 50 MG tablet Take 1 tablet (50 mg total) by mouth every 6 (six) hours as needed for moderate pain or severe pain. 10 tablet 0   No current facility-administered medications on file prior to visit.    BP (!) 160/100   Pulse 85   Temp 98.2 F (36.8 C) (Oral)   Ht 6\' 3"  (1.905 m)   Wt (!) 477 lb (216.4 kg)   SpO2 96%   BMI 59.62 kg/m       Objective:   Physical Exam Vitals and nursing note reviewed.  Constitutional:      Appearance: Normal appearance. He is obese.  Musculoskeletal:         General: Swelling and tenderness present. Normal range of motion.       Legs:     Comments: There is no noticeable redness or warmth.  He does have pain with deep palpation to right hamstring muscle.  Neurological:     General: No focal deficit present.     Mental Status: He is alert and oriented to person, place, and time.  Psychiatric:        Mood and Affect: Mood normal.        Behavior: Behavior normal.        Thought Content: Thought content normal.        Judgment: Judgment normal.       Assessment & Plan:  1. Right hamstring injury, initial encounter This seems to be more muscular but the exam is limited due to body habitus.  He is taking his Eliquis 5 mg twice daily, there is no redness or warmth, I doubt he has recurrence of a blood clot.  Will order ultrasound to rule out hamstring tear.  He was courage to perform more conservative measures such as icing, using a heating pad, stretching, elevation, and Tylenol. - Red flags reviewed with him and his wife.  - [place] RT LOWER EXTREM LTD SOFT TISSUE NON VASCULAR; Future  Dorothyann Peng, NP

## 2022-08-25 ENCOUNTER — Ambulatory Visit: Payer: Self-pay | Admitting: Internal Medicine

## 2022-08-27 ENCOUNTER — Other Ambulatory Visit (HOSPITAL_COMMUNITY): Payer: Self-pay

## 2022-08-30 ENCOUNTER — Telehealth: Payer: Self-pay | Admitting: Internal Medicine

## 2022-08-30 NOTE — Telephone Encounter (Signed)
Prescription Request  08/30/2022  LOV: 07/07/2022  What is the name of the medication or equipment? albuterol  Have you contacted your pharmacy to request a refill? Yes   Which pharmacy would you like this sent to?  [id number] - [place], [address] [place] [address] Phone: [phone] Fax: [phone]     Patient notified that their request is being sent to the clinical staff for review and that they should receive a response within 2 business days.   Please advise at Mobile [phone] (mobile)

## 2022-08-31 ENCOUNTER — Other Ambulatory Visit: Payer: Self-pay

## 2022-08-31 MED ORDER — ALBUTEROL SULFATE HFA 108 (90 BASE) MCG/ACT IN AERS: 2.0000 | INHALATION_SPRAY | Freq: Four times a day (QID) | RESPIRATORY_TRACT | 5 refills | Status: DC | PRN

## 2022-08-31 NOTE — Telephone Encounter (Signed)
Rx request sent to pharmacy.  

## 2022-09-01 ENCOUNTER — Other Ambulatory Visit: Payer: Self-pay | Admitting: *Deleted

## 2022-09-01 ENCOUNTER — Other Ambulatory Visit (HOSPITAL_COMMUNITY): Payer: Self-pay

## 2022-09-01 MED ORDER — ALBUTEROL SULFATE HFA 108 (90 BASE) MCG/ACT IN AERS: 2.0000 | INHALATION_SPRAY | Freq: Four times a day (QID) | RESPIRATORY_TRACT | 5 refills | Status: DC | PRN

## 2022-09-09 ENCOUNTER — Ambulatory Visit
Admission: RE | Admit: 2022-09-09 | Discharge: 2022-09-09 | Disposition: A | Discharge status: Home / Self Care | Payer: No Typology Code available for payment source | Source: Ambulatory Visit | Attending: Adult Health | Admitting: Adult Health

## 2022-09-09 ENCOUNTER — Other Ambulatory Visit: Payer: Self-pay | Admitting: Adult Health

## 2022-09-09 DIAGNOSIS — S76301A Unspecified injury of muscle, fascia and tendon of the posterior muscle group at thigh level, right thigh, initial encounter: Secondary | ICD-10-CM

## 2022-09-14 ENCOUNTER — Ambulatory Visit
Admission: RE | Admit: 2022-09-14 | Discharge: 2022-09-14 | Disposition: A | Discharge status: Home / Self Care | Payer: Self-pay | Source: Ambulatory Visit | Attending: Adult Health | Admitting: Adult Health

## 2022-09-14 DIAGNOSIS — S76301A Unspecified injury of muscle, fascia and tendon of the posterior muscle group at thigh level, right thigh, initial encounter: Secondary | ICD-10-CM

## 2022-09-27 ENCOUNTER — Ambulatory Visit (INDEPENDENT_AMBULATORY_CARE_PROVIDER_SITE_OTHER): Payer: Self-pay | Admitting: Internal Medicine

## 2022-09-27 ENCOUNTER — Encounter: Payer: Self-pay | Admitting: Internal Medicine

## 2022-09-27 ENCOUNTER — Other Ambulatory Visit (HOSPITAL_COMMUNITY): Payer: Self-pay

## 2022-09-27 VITALS — BP 128/78 | HR 65 | Temp 98.1°F | Ht 75.0 in | Wt >= 6400 oz

## 2022-09-27 DIAGNOSIS — I10 Essential (primary) hypertension: Secondary | ICD-10-CM

## 2022-09-27 DIAGNOSIS — M25511 Pain in right shoulder: Secondary | ICD-10-CM | POA: Insufficient documentation

## 2022-09-27 DIAGNOSIS — M79604 Pain in right leg: Secondary | ICD-10-CM | POA: Insufficient documentation

## 2022-09-27 MED ORDER — NYSTATIN 100000 UNIT/GM EX POWD
Freq: Three times a day (TID) | CUTANEOUS | 5 refills | Status: DC
  Filled 2022-09-27: qty 30, 10d supply, fill #0
  Filled 2022-12-01: qty 30, 10d supply, fill #1
  Filled 2023-02-08 – 2023-05-03 (×3): qty 30, 10d supply, fill #2

## 2022-09-27 MED ORDER — ALBUTEROL SULFATE HFA 108 (90 BASE) MCG/ACT IN AERS
2.0000 | INHALATION_SPRAY | Freq: Four times a day (QID) | RESPIRATORY_TRACT | 5 refills | Status: DC | PRN
  Filled 2022-09-27: qty 18, 25d supply, fill #0

## 2022-09-27 MED ORDER — OXYCODONE HCL 5 MG PO TABS
5.0000 mg | ORAL_TABLET | Freq: Four times a day (QID) | ORAL | 0 refills | Status: DC | PRN
  Filled 2022-09-27: qty 30, 8d supply, fill #0

## 2022-09-27 NOTE — Assessment & Plan Note (Signed)
BP Readings from Last 3 Encounters:  09/27/22 128/78  08/19/22 (!) 160/100  07/07/22 (!) 142/90   Stable, pt to continue medical treatment  - diet, wt control

## 2022-09-27 NOTE — Patient Instructions (Signed)
Please take all new medication as prescribed - the oxycodone as needed  Please continue all other medications as before, and refills have been done if requested -  albuterol, nystatin  Please have the pharmacy call with any other refills you may need.  Please keep your appointments with your specialists as you may have planned

## 2022-09-27 NOTE — Progress Notes (Signed)
Patient ID: Larry Fowler, male   DOB: Oct 10, 1970, 52 y.o.   MRN: [id number]        Chief Complaint: follow up right leg pain, right shoudler pain       HPI:  Larry Fowler is a 52 y.o. male here with recent onset left posteromedial leg pain with u/s cw probabel mild muscle tear.  Also c/o right shoudler pain only when leaning on the shoudler in bed.  Pt denies chest pain, increased sob or doe, wheezing, orthopnea, PND, increased LE swelling, palpitations, dizziness or syncope.   Pt denies polydipsia, polyuria, or new focal neuro s/s.   Needs albuterol hfa prn refill, Nystatin powder working well for groin rash, asks for refill.         Wt Readings from Last 3 Encounters:  09/27/22 (!) 489 lb (221.8 kg)  08/19/22 (!) 477 lb (216.4 kg)  07/07/22 (!) 447 lb 2 oz (202.8 kg)   BP Readings from Last 3 Encounters:  09/27/22 128/78  08/19/22 (!) 160/100  07/07/22 (!) 142/90         Past Medical History:  Diagnosis Date   ALLERGIC RHINITIS 09/12/2007   Qualifier: Diagnosis of  By: Jonny Ruiz MD, Len Blalock    ANXIETY 02/16/2007   Qualifier: Diagnosis of  By: Samara Snide     ASTHMA 12/17/2009   Qualifier: Diagnosis of  By: Jonny Ruiz MD, Len Blalock    Cannabis abuse    currently   Chills with fever    DEPRESSION 09/12/2007   Qualifier: Diagnosis of  By: Jonny Ruiz MD, Len Blalock    History of cocaine use    in his 20's   Hyperglycemia    Rectal fistula 2009   Rectal pain    Renal stone    2008   Past Surgical History:  Procedure Laterality Date   FINGER SURGERY  2008/09?   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 which knee per Dr Thurston Hole in his teens   REPAIR QUADRICEPS/HAMSTRING MUSCLES Bilateral 07/21/2018   Procedure: REPAIR BILATERAL QUADRICEPS;  Surgeon: Tarry Kos, MD;  Location: [facility] OR;  Service: Orthopedics;  Laterality: Bilateral;   TONSILLECTOMY      reports that he quit smoking about 11 years ago. His smoking use  included cigarettes. He smoked an average of .25 packs per day. He has never used smokeless tobacco. He reports current alcohol use. He reports current drug use. Drug: Marijuana. family history includes Diabetes type II in his mother; Pneumonia in his father. Allergies  Allergen Reactions   Metformin And Related Diarrhea and Nausea Only   Current Outpatient Medications on File Prior to Visit  Medication Sig Dispense Refill   acetaminophen (TYLENOL) 325 MG tablet Take 2 tablets (650 mg total) by mouth every 6 (six) hours as needed for mild pain or moderate pain. 20 tablet 0   apixaban (ELIQUIS) 5 MG TABS tablet Take 2 tablets (10 mg total) by mouth 2 (two) times daily for 6 days THEN take 1 tablet (5 mg total) by mouth 2 (two) times daily thereafter. 180 tablet 3   Bacitracin-Polymyxin B (NEOSPORIN EX) Apply 1 application  topically daily as needed (irritation of perineum).     diphenhydrAMINE HCl (BENADRYL MAXIMUM STRENGTH EX) Apply 1 application  topically 2 (two) times daily as needed (irritation of perineum).     pioglitazone (ACTOS) 30 MG tablet Take 1  tablet (30 mg total) by mouth daily. 90 tablet 3   SYMBICORT 80-4.5 MCG/ACT inhaler INHALE 2 PUFFS INTO THE LUNGS 2 TIMES DAILY. (Patient taking differently: Inhale 1 puff into the lungs daily as needed (wheezing, shortness of breath).) 10.2 g 6   traMADol (ULTRAM) 50 MG tablet Take 1 tablet (50 mg total) by mouth every 6 (six) hours as needed for moderate pain or severe pain. 10 tablet 0   No current facility-administered medications on file prior to visit.        ROS:  All others reviewed and negative.  Objective        PE:  BP 128/78 (BP Location: Left Arm, Patient Position: Sitting, Cuff Size: Normal)   Pulse 65   Temp 98.1 F (36.7 C) (Oral)   Ht  (1.905 m)   Wt (!) 489 lb (221.8 kg)   SpO2 99%   BMI 61.12 kg/m                 Constitutional: Pt appears in NAD               HENT: Head: NCAT.                Right Ear:  External ear normal.                 Left Ear: External ear normal.                Eyes: . Pupils are equal, round, and reactive to light. Conjunctivae and EOM are normal               Nose: without d/c or deformity               Neck: Neck supple. Gross normal ROM               Cardiovascular: Normal rate and regular rhythm.                 Pulmonary/Chest: Effort normal and breath sounds without rales or wheezing.                Right shoulder with tender mild right AC joint bony chanage               Neurological: Pt is alert. At baseline orientation, motor grossly intact               Skin: Skin is warm. No rashes, no other new lesions, LE edema - none; right posteromedial upper leg with mod to severe tender and mild non discrete sswelling               Psychiatric: Pt behavior is normal without agitation   Micro: none  Cardiac tracings I have personally interpreted today:  none  Pertinent Radiological findings (summarize): none   Lab Results  Component Value Date   WBC 9.3 07/07/2022   HGB 16.1 07/07/2022   HCT 47.6 07/07/2022   PLT 219.0 07/07/2022   GLUCOSE 217 (H) 07/07/2022   CHOL 179 12/28/2019   TRIG 123 12/28/2019   HDL 48 12/28/2019   LDLCALC 108 (H) 12/28/2019   ALT 28 07/07/2022   AST 28 07/07/2022   NA 135 07/07/2022   K 4.0 07/07/2022   CL 97 07/07/2022   CREATININE 0.62 07/07/2022   BUN 13 07/07/2022   CO2 29 07/07/2022   TSH 2.62 12/28/2019   PSA <0.1 12/28/2019   HGBA1C 9.0 (H) 07/07/2022   MICROALBUR 1.6  12/26/2018   Assessment/Plan:  Larry Fowler is a 52 y.o. White or Caucasian [1] male with  has a past medical history of ALLERGIC RHINITIS (09/12/2007), ANXIETY (02/16/2007), ASTHMA (12/17/2009), Cannabis abuse, Chills with fever, DEPRESSION (09/12/2007), History of cocaine use, Hyperglycemia, Rectal fistula (2009), Rectal pain, and Renal stone.  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  Oliver Barre, MD 09/27/2022 9:35 PM [facility]

## 2022-09-27 NOTE — Assessment & Plan Note (Signed)
C/w hamstring tear injury with personal training at the gym - for limited oxycodone 5 mg prn

## 2022-09-27 NOTE — Assessment & Plan Note (Signed)
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

## 2022-10-05 ENCOUNTER — Other Ambulatory Visit (HOSPITAL_COMMUNITY): Payer: Self-pay

## 2022-10-05 ENCOUNTER — Ambulatory Visit: Payer: Self-pay | Admitting: Internal Medicine

## 2022-10-05 ENCOUNTER — Other Ambulatory Visit: Payer: Self-pay

## 2022-10-06 ENCOUNTER — Encounter: Payer: Self-pay | Admitting: Internal Medicine

## 2022-10-06 DIAGNOSIS — M79604 Pain in right leg: Secondary | ICD-10-CM

## 2022-10-07 NOTE — Telephone Encounter (Signed)
Sorry no, I do not normally treat that kind of injury,  though I can refer for PT if he would want to do that

## 2022-10-07 NOTE — Telephone Encounter (Signed)
Called pt back gave him MD response. Pt decline the PT. He states she is doing exercise, its just that his thigh is swollen from the torn muscle. Advise pt to alternate thigh w/ heat and cold. Keep thigh elevated at times. Pretty much the muscle has to healed. Pt agreed and said he will try.Marland KitchenRaechel Chute

## 2022-10-07 NOTE — Telephone Encounter (Signed)
Spoke with patient, is self pay and has financial concerns about seeing [facility].  Wonders if Dr. Jonny Ruiz could provide any home guidance to help pt care for his leg. Heat or ice etc

## 2022-12-01 ENCOUNTER — Other Ambulatory Visit (HOSPITAL_COMMUNITY): Payer: Self-pay

## 2022-12-06 ENCOUNTER — Other Ambulatory Visit (HOSPITAL_COMMUNITY): Payer: Self-pay

## 2022-12-07 ENCOUNTER — Other Ambulatory Visit (HOSPITAL_COMMUNITY): Payer: Self-pay

## 2023-02-08 ENCOUNTER — Other Ambulatory Visit (HOSPITAL_COMMUNITY): Payer: Self-pay

## 2023-02-09 ENCOUNTER — Telehealth: Payer: Self-pay | Admitting: Internal Medicine

## 2023-02-09 NOTE — Telephone Encounter (Signed)
Patient wants someone to call him to discuss adding his mobility issues to his chart  Patient number:  [phone]

## 2023-02-11 NOTE — Telephone Encounter (Signed)
Don returned Pt's call and explained to him why the OV was necessary.

## 2023-02-11 NOTE — Telephone Encounter (Signed)
Patient returned Larry Fowler's call and scheduled the requested appointment. He does not understand why the appointment is needed since he has been using a walker for years. He does not understand why the mobility issues are not already in his chart. Patient is very upset about needing this appointment. He would like a call back at [phone].

## 2023-02-11 NOTE — Telephone Encounter (Signed)
Called and left voicemail, If Pt calls back please schedule him an appt as Dr.[name] would have to be the one to add those diagnosis after the Pt is seen.

## 2023-02-22 ENCOUNTER — Encounter: Payer: Self-pay | Admitting: Internal Medicine

## 2023-02-22 ENCOUNTER — Ambulatory Visit (INDEPENDENT_AMBULATORY_CARE_PROVIDER_SITE_OTHER): Payer: Self-pay | Admitting: Internal Medicine

## 2023-02-22 VITALS — BP 126/74 | HR 76 | Temp 98.1°F | Ht 75.0 in | Wt >= 6400 oz

## 2023-02-22 DIAGNOSIS — E1165 Type 2 diabetes mellitus with hyperglycemia: Secondary | ICD-10-CM

## 2023-02-22 DIAGNOSIS — Z5989 Other problems related to housing and economic circumstances: Secondary | ICD-10-CM

## 2023-02-22 DIAGNOSIS — R269 Unspecified abnormalities of gait and mobility: Secondary | ICD-10-CM

## 2023-02-22 DIAGNOSIS — F32A Depression, unspecified: Secondary | ICD-10-CM

## 2023-02-22 DIAGNOSIS — F419 Anxiety disorder, unspecified: Secondary | ICD-10-CM

## 2023-02-22 DIAGNOSIS — I10 Essential (primary) hypertension: Secondary | ICD-10-CM

## 2023-02-22 LAB — POCT GLYCOSYLATED HEMOGLOBIN (HGB A1C): Hemoglobin A1C: 8.3 % — AB (ref 4.0–5.6)

## 2023-02-22 MED ORDER — DULOXETINE HCL 60 MG PO CPEP: 60.0000 mg | ORAL_CAPSULE | Freq: Every day | ORAL | 11 refills | Status: DC

## 2023-02-22 NOTE — Assessment & Plan Note (Signed)
BP Readings from Last 3 Encounters:  02/22/23 126/74  09/27/22 128/78  08/19/22 (!) 160/100   Stable, pt to continue medical treatment  - diet, wt control

## 2023-02-22 NOTE — Assessment & Plan Note (Signed)
Lab Results  Component Value Date   HGBA1C 9.0 (H) 07/07/2022   Uncontrolled, pt for f/u A1c today

## 2023-02-22 NOTE — Patient Instructions (Addendum)
You will be contacted regarding the referral for: mobility exam with PT  You will be contacted regarding the referral for: Social Services  Your A1c was done today  Please continue all other medications as before, and refills have been done if requested.  Please have the pharmacy call with any other refills you may need.  Please keep your appointments with your specialists as you may have planned  Please make an Appointment to return in 6 months, or sooner if needed

## 2023-02-22 NOTE — Assessment & Plan Note (Signed)
Ok for mobility exam for PT

## 2023-02-22 NOTE — Assessment & Plan Note (Addendum)
With mild worsening, decnies SI or HI, and declines counseling but given possible neuropathy will start cymbalta 60 qd

## 2023-02-22 NOTE — Telephone Encounter (Signed)
I agree he states there is pain to the legs, but I can't prove it based on his statement.  Usually there is testing to prove this.  We are still hoping he may be correct and improve with the cymbalta though, so please continue the plan to start this

## 2023-02-22 NOTE — Progress Notes (Signed)
Patient ID: Larry Fowler, male   DOB: Oct 23, 1970, 53 y.o.   MRN: [id number]        Chief Complaint: follow up difficulty ambulating, and ongoing depression anxiety now with worsening memory issue       HPI:  Larry Fowler is a 52 y.o. male here with difficulty ambulating.  Had to quit actos due to low sugars.  Has bilateral feet numbness and states he believes may have neuropathy.  Has ongoing mobility issue, walks with walker, can walk without for 50 ft , but has debilitating lower back pain improved with his standard walker.  Does still have to sit down every 100 yds or so.  Difficult to lose wt.  No falls.  S/p bilateral quad repair surgury (no hx of knee replacement) .  Pt denies chest pain, increased sob or doe, wheezing, orthopnea, PND, increased LE swelling, palpitations, dizziness or syncope.   Pt denies polydipsia, polyuria, or new focal neuro s/s.  Has an application for SSI, has been denies the first time for SSI. No hx of cardiopulmonary issues except for severe saddle PE embolus or significant neuro issues.  Has mild worsening depression, but no suicidal ideation, or panic; has ongoing anxiety.     Wt Readings from Last 3 Encounters:  02/22/23 (!) 495 lb (224.5 kg)  09/27/22 (!) 489 lb (221.8 kg)  08/19/22 (!) 477 lb (216.4 kg)   BP Readings from Last 3 Encounters:  02/22/23 126/74  09/27/22 128/78  08/19/22 (!) 160/100         Past Medical History:  Diagnosis Date   ALLERGIC RHINITIS 09/12/2007   Qualifier: Diagnosis of  By: Jonny Ruiz MD, Len Blalock    ANXIETY 02/16/2007   Qualifier: Diagnosis of  By: Samara Snide     ASTHMA 12/17/2009   Qualifier: Diagnosis of  By: Jonny Ruiz MD, Len Blalock    Cannabis abuse    currently   Chills with fever    DEPRESSION 09/12/2007   Qualifier: Diagnosis of  By: Jonny Ruiz MD, Len Blalock    History of cocaine use    in his 20's   Hyperglycemia    Rectal fistula 2009   Rectal pain    Renal stone    2008   Past Surgical History:  Procedure Laterality  Date   FINGER SURGERY  2008/09?   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 which knee per Dr Thurston Hole in his teens   REPAIR QUADRICEPS/HAMSTRING MUSCLES Bilateral 07/21/2018   Procedure: REPAIR BILATERAL QUADRICEPS;  Surgeon: Tarry Kos, MD;  Location: [facility] OR;  Service: Orthopedics;  Laterality: Bilateral;   TONSILLECTOMY      reports that he quit smoking about 11 years ago. His smoking use included cigarettes. He has never used smokeless tobacco. He reports current alcohol use. He reports current drug use. Drug: Marijuana. family history includes Diabetes type II in his mother; Pneumonia in his father. Allergies  Allergen Reactions   Metformin And Related Diarrhea and Nausea Only   Current Outpatient Medications on File Prior to Visit  Medication Sig Dispense Refill   acetaminophen (TYLENOL) 325 MG tablet Take 2 tablets (650 mg total) by mouth every 6 (six) hours as needed for mild pain or moderate pain. 20 tablet 0   albuterol (VENTOLIN HFA) 108 (90 Base) MCG/ACT inhaler Inhale 2 puffs into the lungs every 6 (six) hours as needed for  wheezing. 18 g 5   apixaban (ELIQUIS) 5 MG TABS tablet Take 2 tablets (10 mg total) by mouth 2 (two) times daily for 6 days THEN take 1 tablet (5 mg total) by mouth 2 (two) times daily thereafter. 180 tablet 3   Bacitracin-Polymyxin B (NEOSPORIN EX) Apply 1 application  topically daily as needed (irritation of perineum).     diphenhydrAMINE HCl (BENADRYL MAXIMUM STRENGTH EX) Apply 1 application  topically 2 (two) times daily as needed (irritation of perineum).     nystatin (MYCOSTATIN/NYSTOP) powder Apply topically to abdominal skin 3 (three) times daily. 30 g 5   oxyCODONE (OXY IR/ROXICODONE) 5 MG immediate release tablet Take 1 tablet (5 mg total) by mouth every 6 (six) hours as needed for severe pain. 30 tablet 0   traMADol (ULTRAM) 50 MG tablet Take 1 tablet (50 mg total) by mouth  every 6 (six) hours as needed for moderate pain or severe pain. 10 tablet 0   SYMBICORT 80-4.5 MCG/ACT inhaler INHALE 2 PUFFS INTO THE LUNGS 2 TIMES DAILY. (Patient not taking: Reported on 02/22/2023) 10.2 g 6   No current facility-administered medications on file prior to visit.        ROS:  All others reviewed and negative.  Objective        PE:  BP 126/74 (BP Location: Left Arm, Patient Position: Sitting, Cuff Size: Large)   Pulse 76   Temp 98.1 F (36.7 C) (Oral)   Ht 6\' 3"  (1.905 m)   Wt (!) 495 lb (224.5 kg)   SpO2 96%   BMI 61.87 kg/m                 Constitutional: Pt appears in NAD               HENT: Head: NCAT.                Right Ear: External ear normal.                 Left Ear: External ear normal.                Eyes: . Pupils are equal, round, and reactive to light. Conjunctivae and EOM are normal               Nose: without d/c or deformity               Neck: Neck supple. Gross normal ROM               Cardiovascular: Normal rate and regular rhythm.                 Pulmonary/Chest: Effort normal and breath sounds without rales or wheezing.                Abd:  Soft, NT, ND, + BS, no organomegaly               Neurological: Pt is alert. At baseline orientation, motor grossly intact               Skin: Skin is warm. No rashes, no other new lesions, LE edema - none               Psychiatric: Pt behavior is normal without agitation , depressed affect  Micro: none  Cardiac tracings I have personally interpreted today:  none  Pertinent Radiological findings (summarize): none   Lab Results  Component Value Date   WBC 9.3 07/07/2022  HGB 16.1 07/07/2022   HCT 47.6 07/07/2022   PLT 219.0 07/07/2022   GLUCOSE 217 (H) 07/07/2022   CHOL 179 12/28/2019   TRIG 123 12/28/2019   HDL 48 12/28/2019   LDLCALC 108 (H) 12/28/2019   ALT 28 07/07/2022   AST 28 07/07/2022   NA 135 07/07/2022   K 4.0 07/07/2022   CL 97 07/07/2022   CREATININE 0.62 07/07/2022   BUN  13 07/07/2022   CO2 29 07/07/2022   TSH 2.62 12/28/2019   PSA <0.1 12/28/2019   HGBA1C 8.3 (A) 02/22/2023   MICROALBUR 1.6 12/26/2018            Component Ref Range & Units 14:55 (02/22/23) 7 mo ago (07/07/22) 11 mo ago (03/01/22) 3 yr ago (12/28/19) 4 yr ago (12/26/18) 4 yr ago (07/24/18) 5 yr ago (03/22/17)  Hemoglobin A1C 4.0 - 5.6 % 8.3 Abnormal  9.0 High         Assessment/Plan:  NASHUA SHRIVER is a 52 y.o. White or Caucasian [1] male with  has a past medical history of ALLERGIC RHINITIS (09/12/2007), ANXIETY (02/16/2007), ASTHMA (12/17/2009), Cannabis abuse, Chills with fever, DEPRESSION (09/12/2007), History of cocaine use, Hyperglycemia, Rectal fistula (2009), Rectal pain, and Renal stone.  Anxiety and depression With mild worsening, decnies SI or HI, and declines counseling but given possible neuropathy will start cymbalta 60 qd  Diabetes (HCC) Lab Results  Component Value Date   HGBA1C 9.0 (H) 07/07/2022   Uncontrolled, pt for f/u A1c today   Gait disorder Ok for mobility exam for PT  HTN (hypertension) BP Readings from Last 3 Encounters:  02/22/23 126/74  09/27/22 128/78  08/19/22 (!) 160/100   Stable, pt to continue medical treatment  - diet, wt control  Followup: Return if symptoms worsen or fail to improve.  Oliver Barre, MD 02/22/2023 4:20 PM [facility]

## 2023-02-28 ENCOUNTER — Other Ambulatory Visit: Payer: Self-pay

## 2023-02-28 ENCOUNTER — Other Ambulatory Visit: Payer: Self-pay | Admitting: Internal Medicine

## 2023-03-01 ENCOUNTER — Encounter: Payer: Self-pay | Admitting: Internal Medicine

## 2023-03-01 DIAGNOSIS — E1165 Type 2 diabetes mellitus with hyperglycemia: Secondary | ICD-10-CM

## 2023-04-06 ENCOUNTER — Ambulatory Visit: Payer: Self-pay | Admitting: Internal Medicine

## 2023-04-20 ENCOUNTER — Other Ambulatory Visit (HOSPITAL_COMMUNITY): Payer: Self-pay

## 2023-05-02 ENCOUNTER — Other Ambulatory Visit (HOSPITAL_COMMUNITY): Payer: Self-pay

## 2023-05-03 ENCOUNTER — Other Ambulatory Visit (HOSPITAL_COMMUNITY): Payer: Self-pay

## 2023-05-03 ENCOUNTER — Other Ambulatory Visit: Payer: Self-pay

## 2023-05-25 ENCOUNTER — Encounter: Payer: Self-pay | Admitting: Internal Medicine

## 2023-06-16 ENCOUNTER — Telehealth: Payer: Self-pay | Admitting: Internal Medicine

## 2023-06-16 NOTE — Telephone Encounter (Signed)
 Placed on provider desk

## 2023-06-16 NOTE — Telephone Encounter (Signed)
 Patient dropped off document Eliquis  form (page 4), to be filled out by provider. Patient requested to send it back via Call Patient to pick up within 7-days. Document is located in providers tray at front office.Please advise at Mobile [phone] (mobile)

## 2023-06-22 NOTE — Telephone Encounter (Signed)
 Called and let Pt know forms have been faxed and are ready for pick up.

## 2023-06-28 ENCOUNTER — Other Ambulatory Visit (HOSPITAL_COMMUNITY): Payer: Self-pay

## 2023-06-28 ENCOUNTER — Encounter: Payer: Self-pay | Admitting: Internal Medicine

## 2023-06-28 MED ORDER — KETOCONAZOLE 2 % EX CREA
1.0000 | TOPICAL_CREAM | Freq: Every day | CUTANEOUS | 1 refills | Status: DC
  Filled 2023-06-28: qty 30, 15d supply, fill #0

## 2023-06-30 ENCOUNTER — Other Ambulatory Visit (HOSPITAL_COMMUNITY): Payer: Self-pay

## 2023-06-30 ENCOUNTER — Other Ambulatory Visit: Payer: Self-pay

## 2023-06-30 MED ORDER — NYSTATIN 100000 UNIT/GM EX CREA
1.0000 | TOPICAL_CREAM | Freq: Two times a day (BID) | CUTANEOUS | 2 refills | Status: DC
  Filled 2023-06-30: qty 30, 15d supply, fill #0

## 2023-06-30 NOTE — Addendum Note (Signed)
Addended by: [name] on: 06/30/2023 10:04 AM   Modules accepted: Orders

## 2023-07-01 NOTE — Telephone Encounter (Signed)
Will be on the lookout for fax

## 2023-07-01 NOTE — Telephone Encounter (Signed)
Copied from CRM [id number]. Topic: General - Other >> Jun 30, 2023  4:22 PM Desma Mcgregor wrote: Reason for CRM: [facility] calling about the provider's application for medication apixaban (ELIQUIS) 5 MG TABS tablet. There were some missed questions: patient name, dob and if patient is receiving outpatient treatment. They are re faxing this paperwork today. Please fax back [phone] so they can assist.

## 2023-07-04 NOTE — Telephone Encounter (Signed)
Form has been faxed.

## 2023-07-07 ENCOUNTER — Other Ambulatory Visit: Payer: Self-pay | Admitting: Internal Medicine

## 2023-07-07 ENCOUNTER — Other Ambulatory Visit (HOSPITAL_COMMUNITY): Payer: Self-pay

## 2023-07-11 ENCOUNTER — Encounter (HOSPITAL_COMMUNITY): Payer: Self-pay

## 2023-07-11 ENCOUNTER — Other Ambulatory Visit (HOSPITAL_COMMUNITY): Payer: Self-pay

## 2023-07-21 ENCOUNTER — Other Ambulatory Visit: Payer: Self-pay

## 2023-07-21 ENCOUNTER — Other Ambulatory Visit: Payer: Self-pay | Admitting: Internal Medicine

## 2023-07-28 ENCOUNTER — Encounter: Payer: Self-pay | Admitting: Internal Medicine

## 2023-07-29 ENCOUNTER — Other Ambulatory Visit (HOSPITAL_COMMUNITY): Payer: Self-pay

## 2023-07-29 MED ORDER — DOXYCYCLINE HYCLATE 100 MG PO TABS
100.0000 mg | ORAL_TABLET | Freq: Two times a day (BID) | ORAL | 0 refills | Status: DC
  Filled 2023-07-29: qty 20, 10d supply, fill #0

## 2023-07-31 ENCOUNTER — Encounter (HOSPITAL_COMMUNITY): Payer: Self-pay | Admitting: Emergency Medicine

## 2023-07-31 ENCOUNTER — Other Ambulatory Visit: Payer: Self-pay

## 2023-07-31 ENCOUNTER — Emergency Department (HOSPITAL_COMMUNITY)
Admission: EM | Admit: 2023-07-31 | Discharge: 2023-08-01 | Disposition: A | Discharge status: Home / Self Care | Payer: Self-pay | Attending: Emergency Medicine | Admitting: Emergency Medicine

## 2023-07-31 DIAGNOSIS — J45909 Unspecified asthma, uncomplicated: Secondary | ICD-10-CM | POA: Insufficient documentation

## 2023-07-31 DIAGNOSIS — L02215 Cutaneous abscess of perineum: Secondary | ICD-10-CM | POA: Insufficient documentation

## 2023-07-31 DIAGNOSIS — R21 Rash and other nonspecific skin eruption: Secondary | ICD-10-CM

## 2023-07-31 DIAGNOSIS — Z87891 Personal history of nicotine dependence: Secondary | ICD-10-CM | POA: Insufficient documentation

## 2023-07-31 NOTE — ED Triage Notes (Addendum)
 Pt c/o abscess on his perineum since Friday. Hx of the same. States that he had a temp of 101 on Saturday. States that he took tylenol for that temp, hasn't had one since. States that he has taken left over amoxicillin.

## 2023-08-01 ENCOUNTER — Telehealth: Payer: Self-pay

## 2023-08-01 ENCOUNTER — Emergency Department (HOSPITAL_COMMUNITY): Payer: Self-pay

## 2023-08-01 ENCOUNTER — Other Ambulatory Visit (HOSPITAL_COMMUNITY): Payer: Self-pay

## 2023-08-01 LAB — CBC
HCT: 48.5 % (ref 39.0–52.0)
Hemoglobin: 16.4 g/dL (ref 13.0–17.0)
MCH: 30.5 pg (ref 26.0–34.0)
MCHC: 33.8 g/dL (ref 30.0–36.0)
MCV: 90.3 fL (ref 80.0–100.0)
Platelets: 153 10*3/uL (ref 150–400)
RBC: 5.37 MIL/uL (ref 4.22–5.81)
RDW: 13.4 % (ref 11.5–15.5)
WBC: 11.1 10*3/uL — ABNORMAL HIGH (ref 4.0–10.5)
nRBC: 0 % (ref 0.0–0.2)

## 2023-08-01 LAB — COMPREHENSIVE METABOLIC PANEL
ALT: 21 U/L (ref 0–44)
AST: 18 U/L (ref 15–41)
Albumin: 3.4 g/dL — ABNORMAL LOW (ref 3.5–5.0)
Alkaline Phosphatase: 78 U/L (ref 38–126)
Anion gap: 13 (ref 5–15)
BUN: 7 mg/dL (ref 6–20)
CO2: 22 mmol/L (ref 22–32)
Calcium: 8.9 mg/dL (ref 8.9–10.3)
Chloride: 98 mmol/L (ref 98–111)
Creatinine, Ser: 0.66 mg/dL (ref 0.61–1.24)
GFR, Estimated: >60 mL/min (ref >60)
Glucose, Bld: 336 mg/dL — ABNORMAL HIGH (ref 70–99)
Potassium: 3.7 mmol/L (ref 3.5–5.1)
Sodium: 133 mmol/L — ABNORMAL LOW (ref 135–145)
Total Bilirubin: 1.2 mg/dL (ref 0.0–1.2)
Total Protein: 6.7 g/dL (ref 6.5–8.1)

## 2023-08-01 MED ORDER — DOXYCYCLINE HYCLATE 100 MG PO TABS: 100.0000 mg | ORAL_TABLET | Freq: Two times a day (BID) | ORAL | 0 refills | Status: DC

## 2023-08-01 MED ORDER — AMOXICILLIN-POT CLAVULANATE 875-125 MG PO TABS
1.0000 | ORAL_TABLET | Freq: Two times a day (BID) | ORAL | 0 refills | Status: DC
  Filled 2023-08-01: qty 14, 7d supply, fill #0

## 2023-08-01 MED ORDER — LIDOCAINE HCL (PF) 1 % IJ SOLN
10.0000 mL | Freq: Once | INTRAMUSCULAR | Status: AC
  Administered 2023-08-01: 10 mL
  Filled 2023-08-01: qty 10

## 2023-08-01 MED ORDER — FLUCONAZOLE 150 MG PO TABS
150.0000 mg | ORAL_TABLET | Freq: Once | ORAL | Status: AC
  Administered 2023-08-01: 150 mg via ORAL
  Filled 2023-08-01: qty 1

## 2023-08-01 MED ORDER — FLUCONAZOLE 150 MG PO TABS
150.0000 mg | ORAL_TABLET | ORAL | 0 refills | Status: DC
  Filled 2023-08-01: qty 5, 35d supply, fill #0

## 2023-08-01 MED ORDER — IOHEXOL 350 MG/ML SOLN
75.0000 mL | Freq: Once | INTRAVENOUS | Status: AC | PRN
  Administered 2023-08-01: 75 mL via INTRAVENOUS

## 2023-08-01 MED ORDER — CLOTRIMAZOLE 1 % EX CREA
TOPICAL_CREAM | CUTANEOUS | 0 refills | Status: AC
  Filled 2023-08-01: qty 45, 22d supply, fill #0
  Filled 2023-08-01: qty 45, 23d supply, fill #0

## 2023-08-01 MED ORDER — SODIUM CHLORIDE 0.9 % IV SOLN
2.0000 g | Freq: Once | INTRAVENOUS | Status: AC
  Administered 2023-08-01: 2 g via INTRAVENOUS
  Filled 2023-08-01: qty 20

## 2023-08-01 MED ORDER — MORPHINE SULFATE (PF) 4 MG/ML IV SOLN
4.0000 mg | Freq: Once | INTRAVENOUS | Status: AC
  Administered 2023-08-01: 4 mg via INTRAVENOUS
  Filled 2023-08-01: qty 1

## 2023-08-01 NOTE — Addendum Note (Signed)
 Addended by: [name] on: 08/01/2023 12:59 PM   Modules accepted: Orders

## 2023-08-01 NOTE — ED Provider Notes (Addendum)
 [facility] Emergency Department Provider Note MRN:  [id number]  Arrival date & time: 08/01/23     Chief Complaint   Abscess   History of Present Illness   Larry Fowler is a 53 y.o. year-old male with a history of obesity presenting to the ED with chief complaint of abscess.  Patient explains that due to his weight he frequently has rash under his pannus and within the folds of his upper legs.  This has been worse than normal recently.  Has had some swelling and redness to his scrotum recently that is abnormal.  Also feels like he has an abscess to his perineum, drained a bit yesterday but the hole is very very small, he is wondering if he needs further drainage.  Endorsing fevers at home.  Review of Systems  A thorough review of systems was obtained and all systems are negative except as noted in the HPI and PMH.   Patient's Health History    Past Medical History:  Diagnosis Date   ALLERGIC RHINITIS 09/12/2007   Qualifier: Diagnosis of  By: Jonny Ruiz MD, Len Blalock    ANXIETY 02/16/2007   Qualifier: Diagnosis of  By: Tyrone Apple, Lucy     ASTHMA 12/17/2009   Qualifier: Diagnosis of  By: Jonny Ruiz MD, Len Blalock    Cannabis abuse    currently   Chills with fever    DEPRESSION 09/12/2007   Qualifier: Diagnosis of  By: Jonny Ruiz MD, Len Blalock    History of cocaine use    in his 20's   Hyperglycemia    Rectal fistula 2009   Rectal pain    Renal stone    2008    Past Surgical History:  Procedure Laterality Date   FINGER SURGERY  2008/09?   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 which knee per Dr Thurston Hole in his teens   REPAIR QUADRICEPS/HAMSTRING MUSCLES Bilateral 07/21/2018   Procedure: REPAIR BILATERAL QUADRICEPS;  Surgeon: Tarry Kos, MD;  Location: [facility];  Service: Orthopedics;  Laterality: Bilateral;   TONSILLECTOMY      Family History  Problem Relation Age of Onset   Diabetes type II  Mother    Pneumonia Father     Social History   Socioeconomic History   Marital status: Married    Spouse name: Not on file   Number of children: Not on file   Years of education: Not on file   Highest education level: Bachelor's degree (e.g., BA, AB, BS)  Occupational History    Employer: [employer]  Tobacco Use   Smoking status: Former    Current packs/day: 0.00    Types: Cigarettes    Quit date: 03/01/2011    Years since quitting: 12.4   Smokeless tobacco: Never   Tobacco comments:    doesn't smoke the whole cig, burns out while playing video game  Substance and Sexual Activity   Alcohol use: Yes   Drug use: Yes    Types: Marijuana   Sexual activity: Not on file  Other Topics Concern   Not on file  Social History Narrative   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  - [employer]   Marijuana user, former smoker, some EtOH   Social Drivers of Health   Financial Resource Strain: Medium Risk (02/18/2023)   Overall Financial Resource Strain (CARDIA)  Difficulty of Paying Living Expenses: Somewhat hard  Food Insecurity: Food Insecurity Present (02/18/2023)   Hunger Vital Sign    Worried About Running Out of Food in the Last Year: Sometimes true    Ran Out of Food in the Last Year: Sometimes true  Transportation Needs: No Transportation Needs (02/18/2023)   PRAPARE - Administrator, Civil Service (Medical): No    Lack of Transportation (Non-Medical): No  Physical Activity: Unknown (02/18/2023)   Exercise Vital Sign    Days of Exercise per Week: 0 days    Minutes of Exercise per Session: Not on file  Stress: No Stress Concern Present (02/18/2023)   [employer] of Occupational Health - Occupational Stress Questionnaire    Feeling of Stress : Only a little  Social Connections: Moderately Isolated (02/18/2023)   Social Connection and Isolation Panel [NHANES]    Frequency of Communication with Friends and Family: Once a week     Frequency of Social Gatherings with Friends and Family: Three times a week    Attends Religious Services: Never    Active Member of Clubs or Organizations: No    Attends Banker Meetings: Not on file    Marital Status: Married  Catering manager Violence: Not on file     Physical Exam   Vitals:   08/01/23 0208 08/01/23 0215  BP:  (!) 112/59  Pulse:  78  Resp:  18  Temp: 99.1 F (37.3 C)   SpO2:  97%    CONSTITUTIONAL: Well-appearing, obese, no acute distress NEURO/PSYCH:  Alert and oriented x 3, no focal deficits EYES:  eyes equal and reactive ENT/NECK:  no LAD, no JVD CARDIO: Regular rate, well-perfused, normal S1 and S2 PULM:  CTAB no wheezing or rhonchi GI/GU:  non-distended, non-tender MSK/SPINE:  No gross deformities, no edema SKIN:  no rash, atraumatic   *Additional and/or pertinent findings included in MDM below  Diagnostic and Interventional Summary    EKG Interpretation Date/Time:    Ventricular Rate:    PR Interval:    QRS Duration:    QT Interval:    QTC Calculation:   R Axis:      Text Interpretation:         Labs Reviewed  CBC - Abnormal; Notable for the following components:      Result Value   WBC 11.1 (*)    All other components within normal limits  COMPREHENSIVE METABOLIC PANEL - Abnormal; Notable for the following components:   Sodium 133 (*)    Glucose, Bld 336 (*)    Albumin 3.4 (*)    All other components within normal limits    CT PELVIS W CONTRAST  Final Result      Medications  lidocaine (PF) (XYLOCAINE) 1 % injection 10 mL (has no administration in time range)  fluconazole (DIFLUCAN) tablet 150 mg (has no administration in time range)  morphine (PF) 4 MG/ML injection 4 mg (4 mg Intravenous Given 08/01/23 0203)  cefTRIAXone (ROCEPHIN) 2 g in sodium chloride 0.9 % 100 mL IVPB (0 g Intravenous Stopped 08/01/23 0237)  iohexol (OMNIPAQUE) 350 MG/ML injection 75 mL (75 mLs Intravenous Contrast Given 08/01/23 0318)      Procedures  /  Critical Care .Incision and Drainage  Date/Time: 08/01/2023 4:20 AM  Performed by: Sabas Sous, MD Authorized by: Sabas Sous, MD   Consent:    Consent obtained:  Verbal   Consent given by:  Patient   Risks, benefits, and alternatives  were discussed: yes     Risks discussed:  Damage to other organs, bleeding, incomplete drainage, infection and pain   Alternatives discussed: Antibiotics alone. Universal protocol:    Procedure explained and questions answered to patient or proxy's satisfaction: yes     Immediately prior to procedure, a time out was called: yes     Patient identity confirmed:  Verbally with patient Location:    Type:  Abscess   Size:  3cm   Location:  Anogenital   Anogenital location:  Perineum Pre-procedure details:    Skin preparation:  Povidone-iodine Sedation:    Sedation type:  None Anesthesia:    Anesthesia method:  Local infiltration   Local anesthetic:  Lidocaine 1% w/o epi Procedure type:    Complexity:  Complex Procedure details:    Ultrasound guidance: no     Incision types:  Single straight   Incision depth:  Subcutaneous   Wound management:  Probed and deloculated, irrigated with saline, extensive cleaning and debrided   Drainage:  Purulent   Drainage amount:  Copious   Wound treatment:  Wound left open   Packing materials:  1/2 in gauze Post-procedure details:    Procedure completion:  Tolerated well, no immediate complications   ED Course and Medical Decision Making  Initial Impression and Ddx Multiple rashes noted to patient on exam.  Seems to have candidal rash to the leg folds, under the pannus.  These areas are largely mild.  He has a bright red and slightly tender scrotum, which has the appearance of a balanitis type infection, favoring a more active candidal infection in this area.  Under the scrotum there is a tender slightly fluctuant area to the perineum that is likely abscess.  Given patient's risk factors  of obesity, diabetes, anticoagulated, obtaining CT imaging to exclude deeper space infection.  Past medical/surgical history that increases complexity of ED encounter: Obesity, diabetes anticoagulated  Interpretation of Diagnostics I personally reviewed the laboratory assessment and my interpretation is as follows:    No significant blood count or electrolyte disturbance.  CT unremarkable.  Patient Reassessment and Ultimate Disposition/Management     CT without concerning features to suggest deeper space infection, though the most distal aspect of the scrotum was not completely imaged.  On exam there continues to be fluctuance and suspicion for abscess.  But no crepitus or more worrisome physical exam findings.  Abscess drained at bedside, see procedural details above.  Patient has no indication for further testing or admission, appropriate for discharge.  Patient management required discussion with the following services or consulting groups:  None  Complexity of Problems Addressed Acute illness or injury that poses threat of life of bodily function  Additional Data Reviewed and Analyzed Further history obtained from: Further history from spouse/family member  Additional Factors Impacting ED Encounter Risk Prescriptions, Minor Procedures, and Consideration of hospitalization  Elmer Sow. Pilar Plate, MD [facility] Emergency Medicine [facility] [email or web address]  Final Clinical Impressions(s) / ED Diagnoses     ICD-10-CM   1. Abscess, perineum  L02.215     2. Rash  R21       ED Discharge Orders          Ordered    fluconazole (DIFLUCAN) 150 MG tablet  Weekly        08/01/23 0417    clotrimazole (LOTRIMIN) 1 % cream        08/01/23 0417    amoxicillin-clavulanate (AUGMENTIN) 875-125 MG tablet  Every 12 hours  08/01/23 0417             Discharge Instructions Discussed with and Provided to Patient:     Discharge Instructions      You were  evaluated in the Emergency Department and after careful evaluation, we did not find any emergent condition requiring admission or further testing in the hospital.  Your exam/testing today is overall reassuring.  We drained your abscess here in the emergency department.  Take the Augmentin antibiotic as directed to clear any remaining bacterial infection.  We also suspect a fungal skin infection.  Take the fluconazole antifungal pill once a week.  Can also use the clotrimazole fungal cream to the scrotal area twice daily for relief.  Fungal rashes take weeks to fully resolve, would recommend follow-up with your primary care doctor for rash and/or wound recheck.  Can remove 1 inch of the packing material every 2 days as we discussed, you are welcome to return to the emergency department for wound recheck in 2 days.  Please return to the Emergency Department if you experience any worsening of your condition.   Thank you for allowing [place] to be a part of your care.       Sabas Sous, MD 08/01/23 [place] Iha    Sabas Sous, MD 08/01/23 [phone]

## 2023-08-01 NOTE — Transitions of Care (Post Inpatient/ED Visit) (Signed)
   08/01/2023  Name: Larry Fowler MRN: [id number] DOB: 1970/08/27  Today's TOC FU Call Status: Today's TOC FU Call Status:: Unsuccessful Call (1st Attempt) Unsuccessful Call (1st Attempt) Date: 08/01/23  Attempted to reach the patient regarding the most recent Inpatient/ED visit.  Follow Up Plan: Additional outreach attempts will be made to reach the patient to complete the Transitions of Care (Post Inpatient/ED visit) call.   Signature Elyse Jarvis, [place]

## 2023-08-01 NOTE — Discharge Instructions (Signed)
 You were evaluated in the Emergency Department and after careful evaluation, we did not find any emergent condition requiring admission or further testing in the hospital.  Your exam/testing today is overall reassuring.  We drained your abscess here in the emergency department.  Take the Augmentin antibiotic as directed to clear any remaining bacterial infection.  We also suspect a fungal skin infection.  Take the fluconazole antifungal pill once a week.  Can also use the clotrimazole fungal cream to the scrotal area twice daily for relief.  Fungal rashes take weeks to fully resolve, would recommend follow-up with your primary care doctor for rash and/or wound recheck.  Can remove 1 inch of the packing material every 2 days as we discussed, you are welcome to return to the emergency department for wound recheck in 2 days.  Please return to the Emergency Department if you experience any worsening of your condition.   Thank you for allowing [place] to be a part of your care.

## 2023-08-04 ENCOUNTER — Ambulatory Visit (HOSPITAL_COMMUNITY)
Admission: EM | Admit: 2023-08-04 | Discharge: 2023-08-04 | Disposition: A | Discharge status: Home / Self Care | Payer: Self-pay

## 2023-08-04 ENCOUNTER — Other Ambulatory Visit: Payer: Self-pay

## 2023-08-04 ENCOUNTER — Encounter (HOSPITAL_BASED_OUTPATIENT_CLINIC_OR_DEPARTMENT_OTHER): Payer: Self-pay

## 2023-08-04 ENCOUNTER — Encounter (HOSPITAL_COMMUNITY): Payer: Self-pay

## 2023-08-04 ENCOUNTER — Telehealth: Payer: Self-pay

## 2023-08-04 DIAGNOSIS — L03818 Cellulitis of other sites: Secondary | ICD-10-CM

## 2023-08-04 DIAGNOSIS — R42 Dizziness and giddiness: Secondary | ICD-10-CM | POA: Insufficient documentation

## 2023-08-04 DIAGNOSIS — Z5189 Encounter for other specified aftercare: Secondary | ICD-10-CM

## 2023-08-04 DIAGNOSIS — I10 Essential (primary) hypertension: Secondary | ICD-10-CM | POA: Insufficient documentation

## 2023-08-04 DIAGNOSIS — E1165 Type 2 diabetes mellitus with hyperglycemia: Secondary | ICD-10-CM | POA: Insufficient documentation

## 2023-08-04 DIAGNOSIS — B356 Tinea cruris: Secondary | ICD-10-CM

## 2023-08-04 DIAGNOSIS — Z4800 Encounter for change or removal of nonsurgical wound dressing: Secondary | ICD-10-CM | POA: Insufficient documentation

## 2023-08-04 LAB — CBC WITH DIFFERENTIAL/PLATELET
Abs Immature Granulocytes: 0.06 10*3/uL (ref 0.00–0.07)
Basophils Absolute: 0 10*3/uL (ref 0.0–0.1)
Basophils Relative: 0 %
Eosinophils Absolute: 0.1 10*3/uL (ref 0.0–0.5)
Eosinophils Relative: 1 %
HCT: 46.2 % (ref 39.0–52.0)
Hemoglobin: 15.6 g/dL (ref 13.0–17.0)
Immature Granulocytes: 1 %
Lymphocytes Relative: 29 %
Lymphs Abs: 2.7 10*3/uL (ref 0.7–4.0)
MCH: 30.2 pg (ref 26.0–34.0)
MCHC: 33.8 g/dL (ref 30.0–36.0)
MCV: 89.5 fL (ref 80.0–100.0)
Monocytes Absolute: 0.6 10*3/uL (ref 0.1–1.0)
Monocytes Relative: 6 %
Neutro Abs: 6 10*3/uL (ref 1.7–7.7)
Neutrophils Relative %: 63 %
Platelets: 200 10*3/uL (ref 150–400)
RBC: 5.16 MIL/uL (ref 4.22–5.81)
RDW: 13.2 % (ref 11.5–15.5)
WBC: 9.5 10*3/uL (ref 4.0–10.5)
nRBC: 0 % (ref 0.0–0.2)

## 2023-08-04 LAB — BASIC METABOLIC PANEL
Anion gap: 12 (ref 5–15)
BUN: 11 mg/dL (ref 6–20)
CO2: 25 mmol/L (ref 22–32)
Calcium: 9.4 mg/dL (ref 8.9–10.3)
Chloride: 95 mmol/L — ABNORMAL LOW (ref 98–111)
Creatinine, Ser: 0.51 mg/dL — ABNORMAL LOW (ref 0.61–1.24)
GFR, Estimated: >60 mL/min (ref >60)
Glucose, Bld: 441 mg/dL — ABNORMAL HIGH (ref 70–99)
Potassium: 4.2 mmol/L (ref 3.5–5.1)
Sodium: 132 mmol/L — ABNORMAL LOW (ref 135–145)

## 2023-08-04 LAB — CBG MONITORING, ED: Glucose-Capillary: 394 mg/dL — ABNORMAL HIGH (ref 70–99)

## 2023-08-04 NOTE — Telephone Encounter (Signed)
 Copied from CRM [id number]. Topic: Clinical - Medical Advice >> Aug 04, 2023  8:54 AM Clayton Bibles wrote: Reason for CRM: [name] wants to know if Dr. Jonny Ruiz would take care of his  abscess to his perineum (it is packed). He did go to the ED on 07/31/23 for this issue.( Notes in chart).  He is private pay so he wants to check on this before paying to come in for an appointment. I offered to make appointment but he wanted to check on this first.  Please call him at [phone]

## 2023-08-04 NOTE — Telephone Encounter (Signed)
 Very sorry no, as I am not trained as a Careers adviser or wound care  This would be out of my area of expertise and practice

## 2023-08-04 NOTE — Discharge Instructions (Addendum)
 Given the extensive nature and the location of your wound I do recommend that you go to the emergency room for a wound check and repacking.  At this time we are not able to accommodate this due to the location and the need for local anesthetic.  I do recommend that you get this evaluated in the emergency room to make sure that you have not developed a more extensive infection in the groin area given how red and inflamed your skin is.  Please go immediately to the emergency room as directed.  If it anytime you cannot make it to the emergency room please call EMS services for evaluation and transport.

## 2023-08-04 NOTE — ED Triage Notes (Addendum)
 Pt reports he is here today due to dizziness that started Monday. Pt reports he started taking Diflucan and since has been dizzy but reports tonight the dizziness is worse. Pt reports he went to stand earlier today and felt dizzy. Pt reports he is diabetic. Pt reports he also had and I&D x4 days of his perineum and is here for a recheck and repacking. Pt  neuro is intact in triage. Pt denies any dizziness at the time of triage.

## 2023-08-04 NOTE — ED Triage Notes (Signed)
 Patient reports that he had an I and D 4 days ago on his perineum and states he was suppose to do a recheck. Patient states he tried to go to his PCP but never was contacted.  Patient states he has been dizzy since taking Diflucan.

## 2023-08-04 NOTE — ED Provider Notes (Signed)
 [facility]    CSN: [id number] Arrival date & time: 08/04/23  1918      History   Chief Complaint Chief Complaint  Patient presents with   Wound Check    HPI [name] is a 53 y.o. male.   HPI  He reports he had an I&D procedure in perineum about 4 days ago  He reports he was started on Abx and wound was packed  He reports he was instructed to return to PCP for wound evaluation and potential re-packing  He reports the area is constantly draining at home  There is some tenderness with dressing changes- particularly when wick is pulled  He is still taking ABX as directed and is using clotrimazole topical  He reports significant improvement in rash along groin area with clotrimazole and diflucan   He reports some dizziness with head movement when laying in bed He states today he has continued to have dizziness with movement  He reports BP, glucose and O2 at home have been "fine"    Past Medical History:  Diagnosis Date   ALLERGIC RHINITIS 09/12/2007   Qualifier: Diagnosis of  By: Jonny Ruiz MD, Len Blalock    ANXIETY 02/16/2007   Qualifier: Diagnosis of  By: Samara Snide     ASTHMA 12/17/2009   Qualifier: Diagnosis of  By: Jonny Ruiz MD, Len Blalock    Cannabis abuse    currently   Chills with fever    DEPRESSION 09/12/2007   Qualifier: Diagnosis of  By: Jonny Ruiz MD, Len Blalock    History of cocaine use    in his 20's   Hyperglycemia    Rectal fistula 2009   Rectal pain    Renal stone    2008    Patient Active Problem List   Diagnosis Date Noted   Right leg pain 09/27/2022   Right shoulder pain 09/27/2022   History of renal stone 07/07/2022   URI (upper respiratory infection) 07/07/2022   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   DVT (deep venous thrombosis) (HCC) 06/26/2022   Recurrent boils 03/01/2022   Perineal abscess, superficial 03/01/2022   Gait disorder 12/29/2019   Rupture of right quadriceps tendon 11/13/2018   Post-operative pain    Congestion of  both ears    Pruritus    Episode of recurrent major depressive disorder (HCC)    Labile blood glucose    Sleep disturbance    Labile blood pressure    New onset type 2 diabetes mellitus (HCC)    Otitis media    Anxiety and depression    Super-super obese (HCC)    Postoperative pain    Postoperative anemia due to acute blood loss 07/23/2018    Class: Acute   Rupture of left quadriceps muscle    Quadriceps tendon rupture 07/20/2018   Nutritional counseling 07/03/2018   Disorder of tendon of left biceps 12/05/2017   Hypogonadism in male 10/25/2017   Diabetes (HCC)    Encounter for well adult exam with abnormal findings 03/21/2017   Morbid obesity (HCC) 03/21/2017   OSA (obstructive sleep apnea) 03/21/2017   Chronic pain 03/21/2017   HTN (hypertension) 03/21/2017   Fatigue 03/21/2017   Left inguinal hernia 03/21/2017   Rash 03/21/2017   History of cocaine use    Cannabis abuse    Renal stone    Anal abscess, ? 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 which knee per Dr Thurston Hole in his teens   REPAIR QUADRICEPS/HAMSTRING MUSCLES Bilateral 07/21/2018   Procedure: REPAIR BILATERAL QUADRICEPS;  Surgeon: Tarry Kos, MD;  Location: [facility];  Service: Orthopedics;  Laterality: Bilateral;   TONSILLECTOMY         Home Medications    Prior to Admission medications   Medication Sig Start Date End Date Taking? Authorizing Provider  albuterol (VENTOLIN HFA) 108 (90 Base) MCG/ACT inhaler INHALE 2 PUFFS INTO THE LUNGS EVERY 6 HOURS AS NEEDED FOR WHEEZING 07/21/23   [name], MD  acetaminophen (TYLENOL) 325 MG tablet Take 2 tablets (650 mg total) by mouth every 6 (six) hours as needed for  mild pain or moderate pain. 06/29/22   Leroy Sea, MD  amoxicillin-clavulanate (AUGMENTIN) 875-125 MG tablet Take 1 tablet by mouth every 12 (twelve) hours. 08/01/23   Sabas Sous, MD  apixaban (ELIQUIS) 5 MG TABS tablet Take 2 tablets (10 mg total) by mouth 2 (two) times daily for 6 days THEN take 1 tablet (5 mg total) by mouth 2 (two) times daily thereafter. 07/07/22   [name], MD  Bacitracin-Polymyxin B (NEOSPORIN EX) Apply 1 application  topically daily as needed (irritation of perineum).    [provider]  clotrimazole (LOTRIMIN) 1 % cream Apply to affected area 2 times daily 08/01/23   Sabas Sous, MD  diphenhydrAMINE HCl (BENADRYL MAXIMUM STRENGTH EX) Apply 1 application  topically 2 (two) times daily as needed (irritation of perineum).    [provider]  doxycycline (VIBRA-TABS) 100 MG tablet Take 1 tablet (100 mg total) by mouth 2 (two) times daily. 08/01/23   [name], MD  DULoxetine (CYMBALTA) 60 MG capsule Take 1 capsule (60 mg total) by mouth daily. 02/22/23 02/22/24  [name], MD  fluconazole (DIFLUCAN) 150 MG tablet Take 1 tablet (150 mg total) by mouth once a week. 08/01/23   Sabas Sous, MD  ketoconazole (NIZORAL) 2 % cream Apply 1 Application topically daily. 06/28/23   [name], MD  nystatin cream (MYCOSTATIN) Apply 1 Application topically 2 (two) times daily. 06/30/23   [name], MD  oxyCODONE (OXY IR/ROXICODONE) 5 MG immediate release tablet Take 1 tablet (5 mg total) by mouth every 6 (six) hours as needed for severe pain. 09/27/22   [name], MD  SYMBICORT 80-4.5 MCG/ACT inhaler INHALE 2 PUFFS INTO THE LUNGS 2 TIMES DAILY. Patient not taking: Reported on 02/22/2023 05/27/20   Coral Ceo, NP  traMADol (ULTRAM) 50 MG tablet Take 1 tablet (50 mg total) by mouth every 6 (six) hours as needed for moderate pain or severe pain. 06/29/22   Leroy Sea, MD    Family History Family History  Problem Relation Age of Onset    Diabetes type II Mother    Pneumonia Father     Social History Social History   Tobacco Use   Smoking status: Former    Current packs/day: 0.00    Types: Cigarettes    Quit date: 03/01/2011    Years since quitting: 12.4   Smokeless tobacco: Never   Tobacco comments:    doesn't smoke the whole cig, burns out while playing video game  Vaping Use  Vaping status: Never Used  Substance Use Topics   Alcohol use: Yes   Drug use: Not Currently    Types: Marijuana     Allergies   Metformin and related   Review of Systems Review of Systems  Skin:  Positive for wound.     Physical Exam Triage Vital Signs ED Triage Vitals  Encounter Vitals Group     BP 08/04/23 1942 (!) 186/97     Systolic BP Percentile --      Diastolic BP Percentile --      Pulse Rate 08/04/23 1942 77     Resp 08/04/23 1942 16     Temp 08/04/23 1942 97.7 F (36.5 C)     Temp Source 08/04/23 1942 Oral     SpO2 08/04/23 1942 95 %     Weight --      Height --      Head Circumference --      Peak Flow --      Pain Score 08/04/23 1943 3     Pain Loc --      Pain Education --      Exclude from Growth Chart --    No data found.  Updated Vital Signs BP (!) 169/98 (BP Location: Left Arm)   Pulse 77   Temp 97.7 F (36.5 C) (Oral)   Resp 16   SpO2 95%   Visual Acuity Right Eye Distance:   Left Eye Distance:   Bilateral Distance:    Right Eye Near:   Left Eye Near:    Bilateral Near:     Physical Exam Vitals reviewed. Exam conducted with a chaperone present.  Constitutional:      General: He is awake.     Appearance: Normal appearance. He is well-developed and well-groomed.  HENT:     Head: Normocephalic and atraumatic.  Pulmonary:     Effort: Pulmonary effort is normal.  Genitourinary:    Pubic Area: Rash present.     Testes:        Right: Tenderness present.        Left: Tenderness present.       Comments: Groin area is notably erythematous.  I am unsure if white substance  along inner thigh and inguinal cleft is remnants of clotrimazole treatment versus new superficial drainage. Musculoskeletal:     Cervical back: Normal range of motion and neck supple.  Neurological:     Mental Status: He is alert.  Psychiatric:        Behavior: Behavior is cooperative.      UC Treatments / Results  Labs (all labs ordered are listed, but only abnormal results are displayed) Labs Reviewed - No data to display  EKG   Radiology No results found.  Procedures Procedures (including critical care time)  Medications Ordered in UC Medications - No data to display  Initial Impression / Assessment and Plan / UC Course  I have reviewed the triage vital signs and the nursing notes.  Pertinent labs & imaging results that were available during my care of the patient were reviewed by me and considered in my medical decision making (see chart for details).      Final Clinical Impressions(s) / UC Diagnoses   Final diagnoses:  Visit for wound check  Wound check, abscess  Cellulitis of other specified site   Patient presents today for a wound recheck of wound along the posterior/inferior aspect of testicles bordering on perineum.  He does have packed abscess that he  reports will require repacking and evaluation.  Physical exam is notable for erythema and mild swelling.  I reviewed with the patient that he would likely require topical or intradermal local anesthetic.  Given the location of his wound I reviewed with him that I am not comfortable administering this normally comfortable overseeing new wound packing to the area.  Given erythema, potential drainage of the area persistent tenderness I am worried for potential Fournier's gangrene, persistent cellulitis.  Patient reports that he has been taking his Augmentin as directed and is using clotrimazole and Diflucan for ongoing yeast dermatitis and fungal infection.  Given extensive erythema and skin breakdown I am unsure if  this is limited to fungal infection versus extensive cellulitis.  Recommend evaluation and repacking be completed in the emergency room.  Patient was amenable to this recommendation and reports that they will go to the emergency room after discharge from urgent care.    Discharge Instructions      Given the extensive nature and the location of your wound I do recommend that you go to the emergency room for a wound check and repacking.  At this time we are not able to accommodate this due to the location and the need for local anesthetic.  I do recommend that you get this evaluated in the emergency room to make sure that you have not developed a more extensive infection in the groin area given how red and inflamed your skin is.  Please go immediately to the emergency room as directed.  If it anytime you cannot make it to the emergency room please call EMS services for evaluation and transport.     ED Prescriptions   None    PDMP not reviewed this encounter.   Roselind Messier 08/04/23 2037

## 2023-08-04 NOTE — ED Notes (Signed)
 Patient is being discharged from the Urgent Care and sent to the Emergency Department via POV . Per Jacquelin Hawking, PA, patient is in need of higher level of care due to wound location and extensivness of the wound. Patient is aware and verbalizes understanding of plan of care.  Vitals:   08/04/23 1942 08/04/23 1948  BP: (!) 186/97 (!) 169/98  Pulse: 77   Resp: 16   Temp: 97.7 F (36.5 C)   SpO2: 95%

## 2023-08-05 ENCOUNTER — Emergency Department (HOSPITAL_BASED_OUTPATIENT_CLINIC_OR_DEPARTMENT_OTHER)
Admission: EM | Admit: 2023-08-05 | Discharge: 2023-08-05 | Disposition: A | Discharge status: Home / Self Care | Payer: Self-pay | Attending: Emergency Medicine | Admitting: Emergency Medicine

## 2023-08-05 ENCOUNTER — Other Ambulatory Visit (HOSPITAL_COMMUNITY): Payer: Self-pay

## 2023-08-05 DIAGNOSIS — R739 Hyperglycemia, unspecified: Secondary | ICD-10-CM

## 2023-08-05 DIAGNOSIS — Z5189 Encounter for other specified aftercare: Secondary | ICD-10-CM

## 2023-08-05 LAB — CBG MONITORING, ED: Glucose-Capillary: 442 mg/dL — ABNORMAL HIGH (ref 70–99)

## 2023-08-05 MED ORDER — NYSTATIN 100000 UNIT/GM EX POWD
1.0000 | Freq: Three times a day (TID) | CUTANEOUS | 0 refills | Status: DC
  Filled 2023-08-05 – 2023-09-19 (×2): qty 15, 5d supply, fill #0

## 2023-08-05 NOTE — Discharge Instructions (Signed)
 Continue warm compresses or sitz bath's as frequently as possible for the next several days.  Keep an eye on your blood sugars at home and keep a record of them.  Take this with you to your next doctor's appointment.  Use nystatin powder as prescribed to the rash on your abdominal wall.

## 2023-08-05 NOTE — ED Provider Notes (Signed)
 [place] EMERGENCY DEPARTMENT AT [facility] Provider Note   CSN: [id number] Arrival date & time: 08/04/23  2122     History  Chief Complaint  Patient presents with   Dizziness   Wound Check    Larry Fowler is a 53 y.o. male.  Patient is a 53 year old male with past medical history of diabetes, morbid obesity, hypertension, bilateral knee surgery.  Patient presenting today for evaluation of a wound check.  He was seen at [facility] on Monday and diagnosed with an abscess in his perineum.  This was incised and drained, then packed.  Patient presenting here requesting packing removal and a wound check.  He states that the area feels markedly improved.  He does feel somewhat dizzy, but believes this may be related to his blood sugar.  The history is provided by the patient.       Home Medications Prior to Admission medications   Medication Sig Start Date End Date Taking? Authorizing Provider  albuterol (VENTOLIN HFA) 108 (90 Base) MCG/ACT inhaler INHALE 2 PUFFS INTO THE LUNGS EVERY 6 HOURS AS NEEDED FOR WHEEZING 07/21/23   [name], MD  acetaminophen (TYLENOL) 325 MG tablet Take 2 tablets (650 mg total) by mouth every 6 (six) hours as needed for mild pain or moderate pain. 06/29/22   Leroy Sea, MD  amoxicillin-clavulanate (AUGMENTIN) 875-125 MG tablet Take 1 tablet by mouth every 12 (twelve) hours. 08/01/23   Sabas Sous, MD  apixaban (ELIQUIS) 5 MG TABS tablet Take 2 tablets (10 mg total) by mouth 2 (two) times daily for 6 days THEN take 1 tablet (5 mg total) by mouth 2 (two) times daily thereafter. 07/07/22   [name], MD  Bacitracin-Polymyxin B (NEOSPORIN EX) Apply 1 application  topically daily as needed (irritation of perineum).    [provider]  clotrimazole (LOTRIMIN) 1 % cream Apply to affected area 2 times daily 08/01/23   Sabas Sous, MD  diphenhydrAMINE HCl (BENADRYL MAXIMUM STRENGTH EX) Apply 1 application  topically 2 (two)  times daily as needed (irritation of perineum).    [provider]  doxycycline (VIBRA-TABS) 100 MG tablet Take 1 tablet (100 mg total) by mouth 2 (two) times daily. 08/01/23   [name], MD  DULoxetine (CYMBALTA) 60 MG capsule Take 1 capsule (60 mg total) by mouth daily. 02/22/23 02/22/24  [name], MD  fluconazole (DIFLUCAN) 150 MG tablet Take 1 tablet (150 mg total) by mouth once a week. 08/01/23   Sabas Sous, MD  ketoconazole (NIZORAL) 2 % cream Apply 1 Application topically daily. 06/28/23   [name], MD  nystatin cream (MYCOSTATIN) Apply 1 Application topically 2 (two) times daily. 06/30/23   [name], MD  oxyCODONE (OXY IR/ROXICODONE) 5 MG immediate release tablet Take 1 tablet (5 mg total) by mouth every 6 (six) hours as needed for severe pain. 09/27/22   [name], MD  SYMBICORT 80-4.5 MCG/ACT inhaler INHALE 2 PUFFS INTO THE LUNGS 2 TIMES DAILY. Patient not taking: Reported on 02/22/2023 05/27/20   Coral Ceo, NP  traMADol (ULTRAM) 50 MG tablet Take 1 tablet (50 mg total) by mouth every 6 (six) hours as needed for moderate pain or severe pain. 06/29/22   Leroy Sea, MD      Allergies    Metformin and related    Review of Systems   Review of Systems  All other systems reviewed and are negative.  Physical Exam Updated Vital Signs BP (!) 140/77   Pulse 83   Temp 98.5 F (36.9 C)   Resp 18   SpO2 96%  Physical Exam Vitals and nursing note reviewed.  Constitutional:      General: He is not in acute distress.    Appearance: He is well-developed. He is not diaphoretic.  HENT:     Head: Normocephalic and atraumatic.  Pulmonary:     Effort: No respiratory distress.  Genitourinary:    Comments: Inspection of the perineum reveals a dislodged iodoform packing.  The wound itself is draining scant purulent material, but there is no evidence for fluctuance.  The surrounding tissues are mildly erythematous. Musculoskeletal:        General:  Normal range of motion.     Cervical back: Normal range of motion and neck supple.  Skin:    General: Skin is warm and dry.  Neurological:     Mental Status: He is alert and oriented to person, place, and time.     Coordination: Coordination normal.    ED Results / Procedures / Treatments   Labs (all labs ordered are listed, but only abnormal results are displayed) Labs Reviewed  BASIC METABOLIC PANEL - Abnormal; Notable for the following components:      Result Value   Sodium 132 (*)    Chloride 95 (*)    Glucose, Bld 441 (*)    Creatinine, Ser 0.51 (*)    All other components within normal limits  CBG MONITORING, ED - Abnormal; Notable for the following components:   Glucose-Capillary 394 (*)    All other components within normal limits  CBC WITH DIFFERENTIAL/PLATELET    EKG None  Radiology No results found.  Procedures Procedures    Medications Ordered in ED Medications - No data to display  ED Course/ Medical Decision Making/ A&P  Patient presenting with concerns of his wound site.  He had a perineal abscess drained earlier this week.  Packing was removed with scant purulent drainage remaining.  There is no palpable fluctuance and this seems to be improving.  Patient also with complaints of dizziness.  His blood sugar is elevated at 442 on fingerstick, however he is declining intervention for this.  He states he will drink water and observe his sugars at home.  Patient to be discharged at his request.  Final Clinical Impression(s) / ED Diagnoses Final diagnoses:  None    Rx / DC Orders ED Discharge Orders     None         Geoffery Lyons, MD 08/05/23 [phone]

## 2023-08-17 ENCOUNTER — Other Ambulatory Visit (HOSPITAL_COMMUNITY): Payer: Self-pay

## 2023-09-05 LAB — HM DIABETES EYE EXAM

## 2023-09-19 ENCOUNTER — Other Ambulatory Visit: Payer: Self-pay | Admitting: Internal Medicine

## 2023-09-20 ENCOUNTER — Other Ambulatory Visit (HOSPITAL_COMMUNITY): Payer: Self-pay

## 2023-09-20 ENCOUNTER — Other Ambulatory Visit: Payer: Self-pay

## 2023-09-20 MED ORDER — FLUCONAZOLE 150 MG PO TABS
150.0000 mg | ORAL_TABLET | ORAL | 0 refills | Status: DC
  Filled 2023-09-20: qty 5, 35d supply, fill #0

## 2023-09-21 ENCOUNTER — Ambulatory Visit: Payer: Self-pay

## 2023-09-21 NOTE — Telephone Encounter (Signed)
 Chief Complaint: Dizziness Symptoms: Dizziness Frequency: since starting Fluconazole Pertinent Negatives: Patient denies chest pain  Disposition: [] ED /[] Urgent Care (no appt availability in office) / [] Appointment(In office/virtual)/ []  [facility]/ [] Home Care/ [] Refused Recommended Disposition /[] [facility]/ [x]  Follow-up with PCP Additional Notes: Patient called in stating he is having ongoing dizziness. Patient states the dizziness started when he first started Fluconazole, and he reported this in office and was told this can be a side effect. Patient calls in today to state he is still having the dizziness and is asking if he can be either switched to another medication that is just as effective, or have the dosage decreased. Patient would like a return call to advise.   Copied from CRM [id number]. Topic: Clinical - Red Word Triage >> Sep 21, 2023  1:34 PM Howard Macho wrote: Red Word that prompted transfer to Nurse Triage: patient called stating he was prescribed fluconazole (DIFLUCAN) 150 MG tablet at the ER over a month ago. When he took the first tablet then, the day after he was dizzy and it was not as bad as it was this time Patient stated he can not even function at work and want to know if there is something else he can take Reason for Disposition  [1] MILD dizziness (e.g., walking normally) AND [2] has been evaluated by doctor (or NP/PA) for this  Answer Assessment - Initial Assessment Questions 1. DESCRIPTION: "Describe your dizziness."     Like room is spinning 2. LIGHTHEADED: "Do you feel lightheaded?" (e.g., somewhat faint, woozy, weak upon standing)     "Feels like I've been drinking alcohol" 3. VERTIGO: "Do you feel like either you or the room is spinning or tilting?" (i.e. vertigo)     Patient believes he may have dizziness 4. SEVERITY: "How bad is it?"  "Do you feel like you are going to faint?" "Can you stand and walk?"   - MILD: Feels slightly dizzy,  but walking normally.   - MODERATE: Feels unsteady when walking, but not falling; interferes with normal activities (e.g., school, work).   - SEVERE: Unable to walk without falling, or requires assistance to walk without falling; feels like passing out now.      "Interfering with your work" 5. ONSET:  "When did the dizziness begin?"     When he started taking Diflucan 150 mg 6. AGGRAVATING FACTORS: "Does anything make it worse?" (e.g., standing, change in head position)     No aggravating factors 7. HEART RATE: "Can you tell me your heart rate?" "How many beats in 15 seconds?"  (Note: not all patients can do this)       Patient states BP, Glucose, Pulse Ox, and HR are good 8. CAUSE: "What do you think is causing the dizziness?"     Patient believes it's fluconazole 9. RECURRENT SYMPTOM: "Have you had dizziness before?" If Yes, ask: "When was the last time?" "What happened that time?"     N/a 10. OTHER SYMPTOMS: "Do you have any other symptoms?" (e.g., fever, chest pain, vomiting, diarrhea, bleeding)       No  Protocols used: Dizziness - Lightheadedness-A-AH

## 2023-09-22 MED ORDER — FLUCONAZOLE 100 MG PO TABS: 100.0000 mg | ORAL_TABLET | Freq: Every day | ORAL | 0 refills | Status: DC

## 2023-09-22 NOTE — Telephone Encounter (Signed)
 Ok to try again at lower dose - done erx

## 2023-09-22 NOTE — Addendum Note (Signed)
 Addended by: Roslyn Coombe on: 09/22/2023 01:12 PM   Modules accepted: Orders

## 2023-09-26 ENCOUNTER — Telehealth: Payer: Self-pay | Admitting: Internal Medicine

## 2023-09-27 ENCOUNTER — Encounter (HOSPITAL_COMMUNITY): Payer: Self-pay

## 2023-09-27 ENCOUNTER — Other Ambulatory Visit (HOSPITAL_BASED_OUTPATIENT_CLINIC_OR_DEPARTMENT_OTHER): Payer: Self-pay

## 2023-09-27 ENCOUNTER — Other Ambulatory Visit (HOSPITAL_COMMUNITY): Payer: Self-pay

## 2023-09-27 NOTE — Telephone Encounter (Signed)
 Antibiotics are not normally refillable medications,  please consider ROV

## 2023-09-27 NOTE — Telephone Encounter (Signed)
 Copied from CRM [id number]. Topic: Clinical - Medical Advice >> Sep 27, 2023 10:27 AM Albertha Alosa wrote: Reason for CRM: Patient called in wanting to speak with a nurse regarding medication amoxicillin -clavulanate (AUGMENTIN ) 875-125 MG tablet , stated he was unable to refill it and has some questions

## 2023-09-28 ENCOUNTER — Telehealth: Payer: Self-pay | Admitting: Internal Medicine

## 2023-09-28 NOTE — Telephone Encounter (Signed)
 Copied from CRM [id number]. Topic: Clinical - Prescription Issue >> Sep 28, 2023  8:44 AM Larry Fowler wrote: Reason for CRM: Patient called in about medication amoxicillin -clavuanate 875-125 MG tablet. I informed patient of notes left. Offered an appt. For patient but patient does not have health insurance. Patient has an infection and needs antibiotics. Please contact this patient regarding this matter

## 2023-09-29 ENCOUNTER — Other Ambulatory Visit (HOSPITAL_COMMUNITY): Payer: Self-pay

## 2023-09-29 ENCOUNTER — Encounter (HOSPITAL_COMMUNITY): Payer: Self-pay

## 2023-11-02 ENCOUNTER — Encounter: Payer: Self-pay | Admitting: Family Medicine

## 2023-11-02 ENCOUNTER — Ambulatory Visit (INDEPENDENT_AMBULATORY_CARE_PROVIDER_SITE_OTHER): Payer: Self-pay | Admitting: Family Medicine

## 2023-11-02 VITALS — BP 122/80 | HR 78 | Temp 98.5°F | Ht 75.0 in | Wt >= 6400 oz

## 2023-11-02 DIAGNOSIS — Z86711 Personal history of pulmonary embolism: Secondary | ICD-10-CM | POA: Insufficient documentation

## 2023-11-02 DIAGNOSIS — Z6841 Body Mass Index (BMI) 40.0 and over, adult: Secondary | ICD-10-CM | POA: Insufficient documentation

## 2023-11-02 DIAGNOSIS — G8929 Other chronic pain: Secondary | ICD-10-CM

## 2023-11-02 DIAGNOSIS — Z79899 Other long term (current) drug therapy: Secondary | ICD-10-CM

## 2023-11-02 DIAGNOSIS — R269 Unspecified abnormalities of gait and mobility: Secondary | ICD-10-CM

## 2023-11-02 DIAGNOSIS — E1165 Type 2 diabetes mellitus with hyperglycemia: Secondary | ICD-10-CM

## 2023-11-02 DIAGNOSIS — Z7901 Long term (current) use of anticoagulants: Secondary | ICD-10-CM

## 2023-11-02 DIAGNOSIS — M545 Other chronic pain: Secondary | ICD-10-CM

## 2023-11-02 DIAGNOSIS — L03818 Cellulitis of other sites: Secondary | ICD-10-CM

## 2023-11-02 DIAGNOSIS — M6281 Muscle weakness (generalized): Secondary | ICD-10-CM

## 2023-11-02 DIAGNOSIS — L732 Hidradenitis suppurativa: Secondary | ICD-10-CM

## 2023-11-02 DIAGNOSIS — D6859 Other primary thrombophilia: Secondary | ICD-10-CM | POA: Insufficient documentation

## 2023-11-02 DIAGNOSIS — I10 Essential (primary) hypertension: Secondary | ICD-10-CM

## 2023-11-02 DIAGNOSIS — Z7409 Other reduced mobility: Secondary | ICD-10-CM

## 2023-11-02 MED ORDER — AMOXICILLIN-POT CLAVULANATE 875-125 MG PO TABS: 1.0000 | ORAL_TABLET | Freq: Two times a day (BID) | ORAL | 2 refills | Status: AC

## 2023-11-02 NOTE — Progress Notes (Signed)
 Acute Office Visit  Subjective:     Patient ID: Larry Fowler, male    DOB: 1971/04/09, 53 y.o.   MRN: [id number]  No chief complaint on file.   HPI Patient is here to transfer care to me.  Previous patient of Dr. Autry Legions in this office. Has history uncontrolled diabetes, HTN, history of DVT and saddle PE, OSA, history substance abuse, morbid obesity, history of bilateral quadriceps rupture with surgical repair, now uses walker to assist with ambulation.  Reports bilateral leg swelling after prolonged periods of sitting or standing.  Reports low back pain after standing for only 5 minutes.  This is severely limiting his mobility and interfering with daily life. 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  Wears compression socks, states this helps some.  Reports rash to groin, has history of chronic yeast in the area.  Has completed 5 weeks of fluconazole , does have uncontrolled diabetes. Reports that the area is red, itchy, burns at times, states that the skin is fragile and will break open at times. Recently started Augmentin  twice daily and will take for 7 days.  Has tried Hibiclens to the area in the past with little relief.  Reports increased fatigue over the last few months.  States he does not really have the energy to get up and do anything.  Has history of sleep apnea, does not currently use CPAP.  States that he sleeps much better than he did, about 6 to 9 hours at night.  Does state that he needs a nap during the day.  Uncontrolled diabetes, did not tolerate glipizide .  States that he is eating a much healthier diet, has decreased amount of alcohol that he drinks.  A1c 8.3 on 02/22/2023.  Inquiring about possible paperwork to help with disability  ROS Per HPI      Objective:    BP 122/80 (BP Location: Left Arm, Patient Position: Sitting)   Pulse 78   Temp 98.5 F (36.9 C) (Temporal)   Ht 6\' 3"  (1.905 m)   Wt (!) 480 lb  6.4 oz (217.9 kg)   SpO2 97%   BMI 60.05 kg/m    Physical Exam Vitals and nursing note reviewed.  Constitutional:      General: He is not in acute distress.    Appearance: Normal appearance. He is obese.  HENT:     Head: Normocephalic and atraumatic.     Right Ear: External ear normal.     Left Ear: External ear normal.     Nose: Nose normal.     Mouth/Throat:     Mouth: Mucous membranes are moist.     Pharynx: Oropharynx is clear.  Eyes:     Extraocular Movements: Extraocular movements intact.  Neck:     Vascular: No carotid bruit.  Cardiovascular:     Rate and Rhythm: Normal rate and regular rhythm.     Pulses: Normal pulses.     Heart sounds: Normal heart sounds.  Pulmonary:     Effort: Pulmonary effort is normal. No respiratory distress.     Breath sounds: Normal breath sounds. No wheezing, rhonchi or rales.  Musculoskeletal:        General: Signs of injury present.     Cervical back: Normal range of motion.     Right lower leg: No edema.     Left lower leg: No edema.     Comments: Using walker to assist with ambulation  Lymphadenopathy:  Cervical: No cervical adenopathy.  Skin:    General: Skin is warm and dry.  Neurological:     General: No focal deficit present.     Mental Status: He is alert and oriented to person, place, and time.  Psychiatric:        Mood and Affect: Mood normal.        Behavior: Behavior normal.     No results found for any visits on 11/02/23.      Assessment & Plan:   Uncontrolled type 2 diabetes mellitus with hyperglycemia (HCC) Assessment & Plan: Continue efforts in, low-carb diet and healthy activity level Discussed the use of supplements such as berberine, cinnamon Will check A1c at next visit, POC machine not available today   Primary hypertension Assessment & Plan: Controlled, continue DASH diet, efforts in healthy activity level   History of pulmonary embolus (PE) Assessment & Plan: Stable, continue  Eliquis    Current use of long term anticoagulation Assessment & Plan: Continue Eliquis    Hidradenitis suppurativa Assessment & Plan: Discussed use of benzyl peroxide to cleanse the area May trial gentian violet   Orders: -     Amoxicillin -Pot Clavulanate; Take 1 tablet by mouth 2 (two) times daily for 7 days.  Dispense: 14 tablet; Refill: 2  BMI 60.0-69.9, adult (HCC) Assessment & Plan: Discussed healthy diet and activity level, possible candidate for GLP-1    Chronic midline low back pain without sciatica Assessment & Plan: Continue activity as tolerated Gentle stretches as tolerated Continue walker with ambulation   Abnormal gait due to muscle weakness Assessment & Plan: Continue walker Continue activity as tolerated   Limited mobility Assessment & Plan: Progressing some, continue walker Discussed other PT options   Cellulitis of other specified site Assessment & Plan: Augmentin  twice daily x 7 days  Orders: -     Amoxicillin -Pot Clavulanate; Take 1 tablet by mouth 2 (two) times daily for 7 days.  Dispense: 14 tablet; Refill: 2  Medication management Assessment & Plan: Continue current medication regimen  Orders: -     Amoxicillin -Pot Clavulanate; Take 1 tablet by mouth 2 (two) times daily for 7 days.  Dispense: 14 tablet; Refill: 2  Given severity of quadriceps injury history, current back and leg pain, limited mobility, use of assistive devices, he is not able to work full-time job  Meds ordered this encounter  Medications   amoxicillin -clavulanate (AUGMENTIN ) 875-125 MG tablet    Sig: Take 1 tablet by mouth 2 (two) times daily for 7 days.    Dispense:  14 tablet    Refill:  2    Return in about 6 months (around 05/04/2024) for meds.  Wellington Half, FNP

## 2023-11-04 ENCOUNTER — Telehealth: Payer: Self-pay | Admitting: Internal Medicine

## 2023-11-04 NOTE — Telephone Encounter (Signed)
 Copied from CRM [id number]. Topic: MyChart - Other >> Nov 04, 2023  2:45 PM Kevelyn M wrote: Reason for CRM: Patient did not see an after appointment summary in his Mychart. Please advise. Patient needs this to give to his attorneys because he's applying for disability.

## 2023-11-04 NOTE — Telephone Encounter (Signed)
 Spoke with patient, he is needing his office visit summary to submit to his attorney. He says he cannot find it in MyChart, I let him know that provider has not signed his note just yet which is probably why he cannot view it. Advised patient to check again next week, and I will make provider aware of this need.

## 2023-11-04 NOTE — Telephone Encounter (Signed)
 LVM for patient to return call.

## 2023-11-05 ENCOUNTER — Encounter: Payer: Self-pay | Admitting: Family Medicine

## 2023-11-05 DIAGNOSIS — L0390 Cellulitis, unspecified: Secondary | ICD-10-CM | POA: Insufficient documentation

## 2023-11-05 DIAGNOSIS — L732 Hidradenitis suppurativa: Secondary | ICD-10-CM | POA: Insufficient documentation

## 2023-11-05 DIAGNOSIS — Z7409 Other reduced mobility: Secondary | ICD-10-CM | POA: Insufficient documentation

## 2023-11-05 NOTE — Assessment & Plan Note (Signed)
Stable, continue Eliquis

## 2023-11-05 NOTE — Assessment & Plan Note (Signed)
 Continue walker Continue activity as tolerated

## 2023-11-05 NOTE — Assessment & Plan Note (Signed)
 Continue Eliquis .

## 2023-11-05 NOTE — Assessment & Plan Note (Signed)
 Continue current medication regimen

## 2023-11-05 NOTE — Assessment & Plan Note (Signed)
 Controlled, continue DASH diet, efforts in healthy activity level

## 2023-11-05 NOTE — Assessment & Plan Note (Signed)
 Discussed use of benzyl peroxide to cleanse the area May trial gentian violet

## 2023-11-05 NOTE — Assessment & Plan Note (Signed)
 Discussed healthy diet and activity level, possible candidate for GLP-1

## 2023-11-05 NOTE — Patient Instructions (Addendum)
 Recommend gentian violet to fungal rash of the groin once every 2 to 3 days as needed.  This will dye your skin violet.  Wash around the area with warm soapy water, the solution will eventually wear off.  May reapply as needed.  May also try to cleanse the area with benzyl peroxide cleansers.  We will check some labs next time you come in.  For diabetes, recommend checking out the glucose goddess on social media. Read Glucose Goddess by Eleazar Grief Read Blue Zones May also supplement your diet with berberine and cinnamon to help support blood sugar regulation.  For fatigue and inflammation, may consider adding ashwaganda, turmeric and beet root.

## 2023-11-05 NOTE — Assessment & Plan Note (Signed)
 Continue efforts in, low-carb diet and healthy activity level Discussed the use of supplements such as berberine, cinnamon Will check A1c at next visit, POC machine not available today

## 2023-11-05 NOTE — Assessment & Plan Note (Signed)
 Progressing some, continue walker Discussed other PT options

## 2023-11-05 NOTE — Assessment & Plan Note (Signed)
 Augmentin  twice daily x 7 days

## 2023-11-05 NOTE — Assessment & Plan Note (Signed)
 Continue activity as tolerated Gentle stretches as tolerated Continue walker with ambulation

## 2023-12-06 ENCOUNTER — Encounter: Payer: Self-pay | Admitting: Family Medicine

## 2023-12-06 ENCOUNTER — Ambulatory Visit: Payer: Self-pay | Admitting: Family Medicine

## 2023-12-06 VITALS — BP 130/82 | HR 81 | Temp 98.1°F | Ht 75.0 in | Wt >= 6400 oz

## 2023-12-06 DIAGNOSIS — E1165 Type 2 diabetes mellitus with hyperglycemia: Secondary | ICD-10-CM

## 2023-12-06 DIAGNOSIS — B372 Candidiasis of skin and nail: Secondary | ICD-10-CM

## 2023-12-06 DIAGNOSIS — L03012 Cellulitis of left finger: Secondary | ICD-10-CM

## 2023-12-06 DIAGNOSIS — Z6841 Body Mass Index (BMI) 40.0 and over, adult: Secondary | ICD-10-CM

## 2023-12-06 DIAGNOSIS — G8929 Other chronic pain: Secondary | ICD-10-CM

## 2023-12-06 DIAGNOSIS — M545 Low back pain, unspecified: Secondary | ICD-10-CM

## 2023-12-06 LAB — POCT GLYCOSYLATED HEMOGLOBIN (HGB A1C): Hemoglobin A1C: 7.2 % — AB (ref 4.0–5.6)

## 2023-12-06 MED ORDER — HYDROCODONE-ACETAMINOPHEN 5-325 MG PO TABS: 1.0000 | ORAL_TABLET | Freq: Four times a day (QID) | ORAL | 0 refills | Status: DC | PRN

## 2023-12-06 MED ORDER — DOXYCYCLINE HYCLATE 100 MG PO CAPS: 100.0000 mg | ORAL_CAPSULE | Freq: Two times a day (BID) | ORAL | 0 refills | Status: AC

## 2023-12-06 MED ORDER — FLUCONAZOLE 150 MG PO TABS: 150.0000 mg | ORAL_TABLET | ORAL | 0 refills | Status: DC

## 2023-12-06 NOTE — Progress Notes (Signed)
 Discussed during OV

## 2023-12-06 NOTE — Patient Instructions (Signed)
 I have sent in a prescription for doxycycline  for you to take twice a day for 7 days.  Take this medication with food as it can upset your stomach if you do not.  A1c was 7.2 today. Great work!  Continue efforts in healthy diet and activity level.   I sent in fluconazole  for you to take once a week for the next 12 weeks.  I have sent in hydrocodone for you to take 1 tablet every 6 hours as needed for moderate to severe pain.  I have gone ahead and ordered labs for you.  Come back and have these done at your convenience.  Will be in contact with results that require further attention.  Follow-up with me in about 6 months as needed.

## 2023-12-06 NOTE — Progress Notes (Signed)
 Acute Office Visit  Subjective:     Patient ID: Larry Fowler, male    DOB: May 11, 1971, 53 y.o.   MRN: [id number]  Chief Complaint  Patient presents with   Hand Pain    Left index finger, trimmed finger nails a few days ago.Has a pus pocket on it    HPI Patient is in today for evaluation of his left index finger. Reports that he thinks he may have cut his finger when cutting his nails.  Reports that lateral aspect of the left index finger has been draining, is red and tender. Denies abdominal pain, nausea, vomiting or diarrhea, fever.  POC A1c 7.2 today. Has been using berberine, cinnamon, supplementation to help with A1c. Has been working out at [employer], has lost 11 pounds since last visit.  Reports that he still gets boils with ingrown hairs.  Inquiring about a different antibiotic other than Augmentin  that could help with this. Has been using gentian violet with some relief. States that this has improved, but not resolved symptoms.  Still having chronic low back and leg pain.  States Tylenol  is not helping enough.  Has also had tramadol  in the past that is not helping enough.  Has had oxycodone  in the past, states that it is a little too strong.    ROS Per HPI      Objective:    Pulse 81   Temp 98.1 F (36.7 C) (Temporal)   Ht 6' 3 (1.905 m)   Wt (!) 469 lb (212.7 kg)   SpO2 96%   BMI 58.62 kg/m    Physical Exam Vitals and nursing note reviewed.  Constitutional:      General: He is not in acute distress.    Appearance: Normal appearance. He is obese.  HENT:     Head: Normocephalic and atraumatic.     Right Ear: External ear normal.     Left Ear: External ear normal.     Nose: Nose normal.     Mouth/Throat:     Mouth: Mucous membranes are moist.     Pharynx: Oropharynx is clear.   Eyes:     Extraocular Movements: Extraocular movements intact.    Cardiovascular:     Rate and Rhythm: Normal rate and regular rhythm.     Pulses: Normal pulses.      Heart sounds: Normal heart sounds.  Pulmonary:     Effort: Pulmonary effort is normal. No respiratory distress.     Breath sounds: Normal breath sounds. No wheezing, rhonchi or rales.   Musculoskeletal:     Cervical back: Normal range of motion.     Right lower leg: No edema.     Left lower leg: No edema.     Comments: Using 2 wheeled walker  Lymphadenopathy:     Cervical: No cervical adenopathy.   Skin:    General: Skin is warm and dry.     Comments: Medial aspect of the right index finger at the nail is white and yellow in color with obvious pus pocket, erythematous, warm, tender surrounding the area and the pad of the same finger.  Draining thick yellow fluid.   Neurological:     General: No focal deficit present.     Mental Status: He is alert and oriented to person, place, and time.   Psychiatric:        Mood and Affect: Mood normal.        Behavior: Behavior normal.     Results for  orders placed or performed in visit on 12/06/23  POCT HgB A1C  Result Value Ref Range   Hemoglobin A1C 7.2 (A) 4.0 - 5.6 %   HbA1c POC (<> result, manual entry)     HbA1c, POC (prediabetic range)     HbA1c, POC (controlled diabetic range)          Assessment & Plan:   Type 2 diabetes mellitus with hyperglycemia, without long-term current use of insulin  (HCC) -     CBC with Differential/Platelet; Future -     Comprehensive metabolic panel with GFR; Future -     Lipid panel; Future -     Microalbumin / creatinine urine ratio; Future  Cellulitis of finger of left hand -     POCT glycosylated hemoglobin (Hb A1C) -     Doxycycline  Hyclate; Take 1 capsule (100 mg total) by mouth 2 (two) times daily for 7 days.  Dispense: 14 capsule; Refill: 0 -     CBC with Differential/Platelet; Future -     Comprehensive metabolic panel with GFR; Future  Chronic bilateral low back pain without sciatica -     HYDROcodone-Acetaminophen ; Take 1 tablet by mouth every 6 (six) hours as needed for  moderate pain (pain score 4-6).  Dispense: 12 tablet; Refill: 0  Yeast dermatitis -     Fluconazole ; Take 1 tablet (150 mg total) by mouth once a week for 12 doses.  Dispense: 12 tablet; Refill: 0  BMI 60.0-69.9, adult (HCC) Assessment & Plan: Continue efforts in healthy diet and activity level      Meds ordered this encounter  Medications   doxycycline  (VIBRAMYCIN ) 100 MG capsule    Sig: Take 1 capsule (100 mg total) by mouth 2 (two) times daily for 7 days.    Dispense:  14 capsule    Refill:  0   HYDROcodone-acetaminophen  (NORCO/VICODIN) 5-325 MG tablet    Sig: Take 1 tablet by mouth every 6 (six) hours as needed for moderate pain (pain score 4-6).    Dispense:  12 tablet    Refill:  0   fluconazole  (DIFLUCAN ) 150 MG tablet    Sig: Take 1 tablet (150 mg total) by mouth once a week for 12 doses.    Dispense:  12 tablet    Refill:  0    Return if symptoms worsen or fail to improve.  Corean LITTIE Ku, FNP

## 2023-12-06 NOTE — Assessment & Plan Note (Signed)
 Continue efforts in healthy diet and activity level

## 2023-12-15 ENCOUNTER — Encounter: Payer: Self-pay | Admitting: Family Medicine

## 2024-01-11 ENCOUNTER — Encounter: Payer: Self-pay | Admitting: Family Medicine

## 2024-01-12 ENCOUNTER — Telehealth: Payer: Self-pay | Admitting: Family Medicine

## 2024-01-12 MED ORDER — DOXYCYCLINE HYCLATE 100 MG PO CAPS: 100.0000 mg | ORAL_CAPSULE | Freq: Two times a day (BID) | ORAL | 0 refills | Status: AC

## 2024-01-12 NOTE — Telephone Encounter (Signed)
 Copied from CRM [id number]. Topic: General - Call Back - No Documentation >> Jan 12, 2024 12:23 PM Rea C wrote: Reason for CRM: Patient would like a call back from [place] or [place].

## 2024-01-12 NOTE — Telephone Encounter (Signed)
 Replied to patient via MyChart.

## 2024-01-25 ENCOUNTER — Other Ambulatory Visit: Payer: Self-pay

## 2024-01-25 MED ORDER — ALBUTEROL SULFATE HFA 108 (90 BASE) MCG/ACT IN AERS: 2.0000 | INHALATION_SPRAY | Freq: Four times a day (QID) | RESPIRATORY_TRACT | 3 refills | Status: DC | PRN

## 2024-02-10 ENCOUNTER — Encounter: Payer: Self-pay | Admitting: Family Medicine

## 2024-02-14 ENCOUNTER — Other Ambulatory Visit: Payer: Self-pay | Admitting: Family Medicine

## 2024-02-28 ENCOUNTER — Other Ambulatory Visit (INDEPENDENT_AMBULATORY_CARE_PROVIDER_SITE_OTHER): Payer: Self-pay

## 2024-02-28 ENCOUNTER — Other Ambulatory Visit: Payer: Self-pay | Admitting: Family Medicine

## 2024-02-28 DIAGNOSIS — E1165 Type 2 diabetes mellitus with hyperglycemia: Secondary | ICD-10-CM

## 2024-02-28 DIAGNOSIS — L03012 Cellulitis of left finger: Secondary | ICD-10-CM

## 2024-02-28 LAB — COMPREHENSIVE METABOLIC PANEL WITH GFR
ALT: 24 U/L (ref 0–53)
AST: 18 U/L (ref 0–37)
Albumin: 3.9 g/dL (ref 3.5–5.2)
Alkaline Phosphatase: 77 U/L (ref 39–117)
BUN: 10 mg/dL (ref 6–23)
CO2: 25 meq/L (ref 19–32)
Calcium: 9.1 mg/dL (ref 8.4–10.5)
Chloride: 99 meq/L (ref 96–112)
Creatinine, Ser: 0.54 mg/dL (ref 0.40–1.50)
GFR: 115.04 mL/min (ref >60.00)
Glucose, Bld: 395 mg/dL — ABNORMAL HIGH (ref 70–99)
Potassium: 3.7 meq/L (ref 3.5–5.1)
Sodium: 134 meq/L — ABNORMAL LOW (ref 135–145)
Total Bilirubin: 0.6 mg/dL (ref 0.2–1.2)
Total Protein: 6.5 g/dL (ref 6.0–8.3)

## 2024-02-28 LAB — CBC WITH DIFFERENTIAL/PLATELET
Basophils Absolute: 0 K/uL (ref 0.0–0.1)
Basophils Relative: 0.3 % (ref 0.0–3.0)
Eosinophils Absolute: 0.1 K/uL (ref 0.0–0.7)
Eosinophils Relative: 0.7 % (ref 0.0–5.0)
HCT: 43 % (ref 39.0–52.0)
Hemoglobin: 14.5 g/dL (ref 13.0–17.0)
Lymphocytes Relative: 37.3 % (ref 12.0–46.0)
Lymphs Abs: 2.6 K/uL (ref 0.7–4.0)
MCHC: 33.7 g/dL (ref 30.0–36.0)
MCV: 90.9 fl (ref 78.0–100.0)
Monocytes Absolute: 0.5 K/uL (ref 0.1–1.0)
Monocytes Relative: 7.2 % (ref 3.0–12.0)
Neutro Abs: 3.9 K/uL (ref 1.4–7.7)
Neutrophils Relative %: 54.5 % (ref 43.0–77.0)
Platelets: 147 K/uL — ABNORMAL LOW (ref 150.0–400.0)
RBC: 4.73 Mil/uL (ref 4.22–5.81)
RDW: 14.4 % (ref 11.5–15.5)
WBC: 7.1 K/uL (ref 4.0–10.5)

## 2024-02-28 LAB — LIPID PANEL
Cholesterol: 189 mg/dL (ref 0–200)
HDL: 45.8 mg/dL (ref >39.00)
LDL Cholesterol: 113 mg/dL — ABNORMAL HIGH (ref 0–99)
NonHDL: 143.17
Total CHOL/HDL Ratio: 4
Triglycerides: 151 mg/dL — ABNORMAL HIGH (ref 0.0–149.0)
VLDL: 30.2 mg/dL (ref 0.0–40.0)

## 2024-02-28 LAB — MICROALBUMIN / CREATININE URINE RATIO
Creatinine,U: 43.8 mg/dL
Microalb Creat Ratio: 17.6 mg/g (ref 0.0–30.0)
Microalb, Ur: 0.8 mg/dL (ref 0.0–1.9)

## 2024-02-28 NOTE — Addendum Note (Signed)
 Addended by: Mariyanna Mucha L on: 02/28/2024 05:06 PM   Modules accepted: Orders

## 2024-02-29 NOTE — Progress Notes (Signed)
 Lab add on form sent to lab

## 2024-03-08 ENCOUNTER — Other Ambulatory Visit: Payer: Self-pay | Admitting: Family Medicine

## 2024-03-08 DIAGNOSIS — J4530 Mild persistent asthma, uncomplicated: Secondary | ICD-10-CM

## 2024-03-08 MED ORDER — FLUTICASONE-SALMETEROL 250-50 MCG/ACT IN AEPB: 1.0000 | INHALATION_SPRAY | Freq: Two times a day (BID) | RESPIRATORY_TRACT | 6 refills | Status: DC

## 2024-03-23 ENCOUNTER — Ambulatory Visit (INDEPENDENT_AMBULATORY_CARE_PROVIDER_SITE_OTHER): Payer: Self-pay | Admitting: Family

## 2024-03-23 ENCOUNTER — Encounter: Payer: Self-pay | Admitting: Family

## 2024-03-23 ENCOUNTER — Telehealth: Payer: Self-pay

## 2024-03-23 VITALS — BP 142/83 | HR 83 | Temp 97.6°F | Ht 75.0 in | Wt >= 6400 oz

## 2024-03-23 DIAGNOSIS — M545 Low back pain, unspecified: Secondary | ICD-10-CM

## 2024-03-23 DIAGNOSIS — R310 Gross hematuria: Secondary | ICD-10-CM

## 2024-03-23 LAB — POCT URINALYSIS DIPSTICK
Blood, UA: POSITIVE — AB
Glucose, UA: POSITIVE — AB
Ketones, UA: POSITIVE — AB
Leukocytes, UA: NEGATIVE
Nitrite, UA: NEGATIVE
Protein, UA: NEGATIVE
Spec Grav, UA: 1.015 (ref 1.010–1.025)
Urobilinogen, UA: 0.2 U/dL
pH, UA: 6 (ref 5.0–8.0)

## 2024-03-23 MED ORDER — HYDROCODONE-ACETAMINOPHEN 5-325 MG PO TABS: 1.0000 | ORAL_TABLET | Freq: Four times a day (QID) | ORAL | 0 refills | Status: DC | PRN

## 2024-03-23 MED ORDER — TAMSULOSIN HCL 0.4 MG PO CAPS: 0.4000 mg | ORAL_CAPSULE | ORAL | 0 refills | Status: DC

## 2024-03-23 NOTE — Progress Notes (Unsigned)
 Patient ID: Larry Fowler, male    DOB: 1971-03-16, 53 y.o.   MRN: [id number]  Chief Complaint  Patient presents with   Back Pain    Pt c/o left side lower back pain and blood in urine. Present since this morning, Has tried tylenol , which did not help sx.   Discussed the use of AI scribe software for clinical note transcription with the patient, who gave verbal consent to proceed.  History of Present Illness Larry Fowler is a 53 year old male with a history of kidney stones who presents with hematuria and lower back pain.  Hematuria began this morning with reddish-orange tint, then became normal color after 2-3 more urinations, then back to the reddish- orange tint and red tinged in office today. Lower back pain radiates to the left side and left groin, presenting as sharp, colicky waves similar to previous kidney stone episodes many years ago. Urine stream is weak, with intermittent stopping and gushing. He uses hydrocodone  for pain management as needed. No burning sensation during urination, fever, or chills. He has not seen a urologist before and has not experienced hematuria during past kidney stone episodes, but he started Eliquis  last year after a PE. He has a history of muscle injuries affecting his ability to stand, necessitating urination in a jug.  Assessment & Plan Hematuria and suspected nephrolithiasis Acute hematuria likely due to nephrolithiasis, exacerbated by Eliquis . Increased bleeding risk but not severe. History suggests nephrolithiasis. UA negative for UTI, positive blood. - Do not take Eliquis  tonight or tomorrow morning, resume tomorrow evening. - Prescribe tamsulosin 0.4mg  bid x 2d, then qd for total of 7d or until stone passes or pain ceases. - Advise monitoring for increased blood in urine (frank blood or clots) or worsening pain, seek ED. - Provided urine hat to catch passed stone. - Can use sieve to catch stone at home. - Referred to urology in  case hematuria or pain continues.   Low back and suprapubic pain Acute pain likely related to nephrolithiasis, similar to previous episodes. Pain management available but not used. - Refill hydrocodone -ACET 5-325mg  for pain management as needed. - Can apply heat up to 30 minutes 3 times per day. - Instruct to seek emergency care if pain becomes unmanageable.  Subjective:    Outpatient Medications Prior to Visit  Medication Sig Dispense Refill   albuterol  (VENTOLIN  HFA) 108 (90 Base) MCG/ACT inhaler Inhale 2 puffs into the lungs every 6 (six) hours as needed for wheezing or shortness of breath. 18 g 3   apixaban  (ELIQUIS ) 5 MG TABS tablet Take 2 tablets (10 mg total) by mouth 2 (two) times daily for 6 days THEN take 1 tablet (5 mg total) by mouth 2 (two) times daily thereafter. 180 tablet 3   clotrimazole  (LOTRIMIN ) 1 % cream Apply to affected area 2 times daily 45 g 0   diphenhydrAMINE  HCl (BENADRYL  MAXIMUM STRENGTH EX) Apply 1 application  topically 2 (two) times daily as needed (irritation of perineum).     doxycycline  (VIBRAMYCIN ) 100 MG capsule Take 1 capsule by mouth twice a day for 7 days. 14 capsule PRN   fluconazole  (DIFLUCAN ) 150 MG tablet Take 1 tablet (150 mg total) by mouth once a week for 12 doses. 12 tablet 0   fluticasone -salmeterol (WIXELA INHUB) 250-50 MCG/ACT AEPB Inhale 1 puff into the lungs in the morning and at bedtime. 60 each 6   HYDROcodone -acetaminophen  (NORCO/VICODIN) 5-325 MG tablet Take 1 tablet by mouth every 6 (  six) hours as needed for moderate pain (pain score 4-6). 12 tablet 0   No facility-administered medications prior to visit.   Past Medical History:  Diagnosis Date   ALLERGIC RHINITIS 09/12/2007   Qualifier: Diagnosis of  By: Norleen MD, Lynwood ORN    ANXIETY 02/16/2007   Qualifier: Diagnosis of  By: Wilhemina KRAFT, Lucy     ASTHMA 12/17/2009   Qualifier: Diagnosis of  By: Norleen MD, Lynwood ORN    Cannabis abuse    currently   Chills with fever    DEPRESSION  09/12/2007   Qualifier: Diagnosis of  By: Norleen MD, Lynwood ORN    History of cocaine use    in his 20's   Hyperglycemia    Rectal fistula 2009   Rectal pain    Renal stone    2008   Past Surgical History:  Procedure Laterality Date   FINGER SURGERY  2008/09?   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 which knee per Dr Jane in his teens   REPAIR QUADRICEPS/HAMSTRING MUSCLES Bilateral 07/21/2018   Procedure: REPAIR BILATERAL QUADRICEPS;  Surgeon: Jerri Kay HERO, MD;  Location: [facility] OR;  Service: Orthopedics;  Laterality: Bilateral;   TONSILLECTOMY     Allergies  Allergen Reactions   Metformin  And Related Diarrhea and Nausea Only      Objective:    Physical Exam Vitals and nursing note reviewed.  Constitutional:      General: He is not in acute distress.    Appearance: Normal appearance. He is morbidly obese.  HENT:     Head: Normocephalic.  Cardiovascular:     Rate and Rhythm: Normal rate and regular rhythm.  Pulmonary:     Effort: Pulmonary effort is normal.     Breath sounds: Normal breath sounds.  Musculoskeletal:        General: Normal range of motion.     Cervical back: Normal range of motion.  Skin:    General: Skin is warm and dry.  Neurological:     Mental Status: He is alert and oriented to person, place, and time.  Psychiatric:        Mood and Affect: Mood normal.    BP (!) 142/83 (BP Location: Left Arm, Patient Position: Sitting, Cuff Size: Large)   Pulse 83   Temp 97.6 F (36.4 C) (Temporal)   Ht 6' 3 (1.905 m)   Wt (!) 477 lb (216.4 kg)   SpO2 93%   BMI 59.62 kg/m  Wt Readings from Last 3 Encounters:  03/23/24 (!) 477 lb (216.4 kg)  12/06/23 (!) 469 lb (212.7 kg)  11/02/23 (!) 480 lb 6.4 oz (217.9 kg)      Lucius Krabbe, NP

## 2024-03-23 NOTE — Telephone Encounter (Signed)
 Copied from CRM #[id number]. Topic: Clinical - Medical Advice >> Mar 23, 2024 10:53 AM Charolett L wrote: Reason for CRM: Patient went to bed with lower back pain. Urine was dark yellow/orange/red. Wanted to get urine test because he feels there's something going on with his kidney. Patient requesting to speak with [facility] Per CAL advised patient to go to urgent care Patient is requesting for kiya to give him a call back asap

## 2024-03-23 NOTE — Telephone Encounter (Signed)
 Pt called back and scheduled an appt at [facility] today at 4p.

## 2024-04-02 NOTE — Progress Notes (Signed)
 Chief Complaint: Flank pain, blood in urine  History of Present Illness:  53 yo male w/ h/o urolithiasis presents w/ a recent episode of flank pain w/ gross hematuria. Hematuria noted approximately 12 days ago, that has resolved.  However, he has lower urinary tract symptoms and flank pain.  Had a kidney stone passed when he was 53 years old.  He has not had any since.  He is morbidly obese, weighs 440 pounds.  Past Medical History:  Past Medical History:  Diagnosis Date   ALLERGIC RHINITIS 09/12/2007   Qualifier: Diagnosis of  By: Norleen MD, [place] ORN    ANXIETY 02/16/2007   Qualifier: Diagnosis of  By: Wilhemina KRAFT, Lucy     ASTHMA 12/17/2009   Qualifier: Diagnosis of  By: Norleen MD, [place] ORN    Cannabis abuse    currently   Chills with fever    DEPRESSION 09/12/2007   Qualifier: Diagnosis of  By: Norleen MD, [place] ORN    History of cocaine use    in his 20's   Hyperglycemia    Rectal fistula 2009   Rectal pain    Renal stone    2008    Past Surgical History:  Past Surgical History:  Procedure Laterality Date   FINGER SURGERY  2008/09?   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 which knee per Dr Jane in his teens   REPAIR QUADRICEPS/HAMSTRING MUSCLES Bilateral 07/21/2018   Procedure: REPAIR BILATERAL QUADRICEPS;  Surgeon: Jerri Kay HERO, MD;  Location: [facility] OR;  Service: Orthopedics;  Laterality: Bilateral;   TONSILLECTOMY      Allergies:  Allergies  Allergen Reactions   Metformin  And Related Diarrhea and Nausea Only    Family History:  Family History  Problem Relation Age of Onset   Diabetes type II Mother    Diabetes Mother    Pneumonia Father    Alcohol abuse Father    Kidney disease Maternal Grandfather     Social History:  Social History   Tobacco Use   Smoking status: Former    Current packs/day: 0.00    Types: Cigarettes    Quit date: 03/01/2011    Years since quitting: 13.0    Smokeless tobacco: Never   Tobacco comments:    doesn't smoke the whole cig, burns out while playing video game  Vaping Use   Vaping status: Never Used  Substance Use Topics   Alcohol use: Yes   Drug use: Not Currently    Types: Marijuana    Review of symptoms:  Constitutional:  Negative for unexplained weight loss, night sweats, fever, chills ENT:  Negative for nose bleeds, sinus pain, painful swallowing CV:  Negative for chest pain, shortness of breath, exercise intolerance, palpitations, loss of consciousness Resp:  Negative for cough, wheezing, shortness of breath GI:  Negative for nausea, vomiting, diarrhea, bloody stools GU:  Positives noted in HPI; otherwise negative for gross hematuria, dysuria, urinary incontinence Neuro:  Negative for seizures, poor balance, limb weakness, slurred speech Psych:  Negative for lack of energy, depression, anxiety Endocrine:  Negative for polydipsia, polyuria, symptoms of hypoglycemia (dizziness, hunger, sweating) Hematologic:  Negative for anemia, purpura, petechia, prolonged or excessive bleeding, use of anticoagulants  Allergic:  Negative for difficulty breathing or choking as a result of exposure to anything; no shellfish allergy ; no allergic response (rash/itch) to materials, foods  Physical exam: There were  no vitals taken for this visit. GENERAL APPEARANCE: Morbidly obese adult male in no distress.  He uses a walker. HEENT: Atraumatic, Normocephalic. NECK: Normal appearance LUNGS: Normal inspiratory and expiratory excursion HEART: Regular Rate ABDOMEN morbidly obese.  No specific tenderness.  No CVA tenderness noted. EXTREMITIES: Moves all extremities well.  Without clubbing, cyanosis, or edema. NEUROLOGIC:  Alert and oriented x 3, normal gait, CN II-XII grossly intact.  MENTAL STATUS:  Appropriate. SKIN:  Warm, dry and intact.    Results:  I have reviewed referring/prior physicians notes  I have reviewed urinalysis--microscopic  hematuria and  I have reviewed prior imaging  Assessment: 1.  Left flank pain with hematuria, acute-most likely of stone  2.  Morbid obesity, patient weighs 440 pounds   Plan: 1.  I let the patient know that, more than likely this is probably a stone  2.  Will get a CT stone protocol today, notify him of results.

## 2024-04-04 ENCOUNTER — Telehealth: Payer: Self-pay

## 2024-04-04 ENCOUNTER — Other Ambulatory Visit: Payer: Self-pay

## 2024-04-04 ENCOUNTER — Ambulatory Visit: Payer: Self-pay | Admitting: Urology

## 2024-04-04 ENCOUNTER — Other Ambulatory Visit: Payer: Self-pay | Admitting: Family Medicine

## 2024-04-04 VITALS — BP 148/129 | HR 86 | Ht 74.0 in | Wt >= 6400 oz

## 2024-04-04 DIAGNOSIS — R310 Gross hematuria: Secondary | ICD-10-CM

## 2024-04-04 DIAGNOSIS — Z87442 Personal history of urinary calculi: Secondary | ICD-10-CM

## 2024-04-04 DIAGNOSIS — R10A2 Flank pain, left side: Secondary | ICD-10-CM

## 2024-04-04 DIAGNOSIS — R319 Hematuria, unspecified: Secondary | ICD-10-CM

## 2024-04-04 LAB — URINALYSIS, ROUTINE W REFLEX MICROSCOPIC
Bilirubin, UA: NEGATIVE
Leukocytes,UA: NEGATIVE
Nitrite, UA: NEGATIVE
Protein,UA: NEGATIVE
Specific Gravity, UA: 1.015 (ref 1.005–1.030)
Urobilinogen, Ur: 0.2 mg/dL (ref 0.2–1.0)
pH, UA: 5.5 (ref 5.0–7.5)

## 2024-04-04 LAB — MICROSCOPIC EXAMINATION: Bacteria, UA: NONE SEEN

## 2024-04-04 MED ORDER — TAMSULOSIN HCL 0.4 MG PO CAPS: 0.4000 mg | ORAL_CAPSULE | Freq: Every day | ORAL | 0 refills | Status: DC

## 2024-04-04 NOTE — Telephone Encounter (Signed)
 Spoke with patient, he is asking for any advise as to what to do regarding the kidney stones he is being told he has. Has a CT scan scheduled for Friday. He seen another [facility] provider on 10/17 due to not being able to be seen at our office, was prescribed 9 days of Flomax and has since ran out. He had a visit to see a Urologist today, and states they were not helpful at all. He mentions he asked the provider about possible lithotripsy, and the provider made a rude comment about patient being too overweight and being in bad health/shape. He does not wish to see that provider again, wants to know next steps from PCP. He is requesting a refill of the Flomax, and is asking if he can take a supplement called [employer]. He is still in a good amount of pain. Please advise.

## 2024-04-04 NOTE — Telephone Encounter (Signed)
 LVM for patient to return call.

## 2024-04-04 NOTE — Telephone Encounter (Signed)
 Copied from CRM [id number]. Topic: Clinical - Medical Advice >> Apr 04, 2024 11:30 AM Eva FALCON wrote: Reason for CRM: Pt was calling in asking to speak to Corean Ku nurse. States he went to see the urologist today and has a question regarding a medication, would not go further into detail. States he hopes for a call back since last week when he call he never received a call back. Please advise

## 2024-04-05 ENCOUNTER — Ambulatory Visit (HOSPITAL_BASED_OUTPATIENT_CLINIC_OR_DEPARTMENT_OTHER)
Admission: RE | Admit: 2024-04-05 | Discharge: 2024-04-05 | Disposition: A | Discharge status: Home / Self Care | Payer: Self-pay | Source: Ambulatory Visit | Attending: Urology | Admitting: Urology

## 2024-04-05 ENCOUNTER — Other Ambulatory Visit: Payer: Self-pay | Admitting: Family Medicine

## 2024-04-05 ENCOUNTER — Telehealth: Payer: Self-pay

## 2024-04-05 DIAGNOSIS — R310 Gross hematuria: Secondary | ICD-10-CM | POA: Insufficient documentation

## 2024-04-05 DIAGNOSIS — M545 Low back pain, unspecified: Secondary | ICD-10-CM

## 2024-04-05 DIAGNOSIS — N200 Calculus of kidney: Secondary | ICD-10-CM

## 2024-04-05 DIAGNOSIS — Z87442 Personal history of urinary calculi: Secondary | ICD-10-CM | POA: Insufficient documentation

## 2024-04-05 MED ORDER — CYCLOBENZAPRINE HCL 10 MG PO TABS: 10.0000 mg | ORAL_TABLET | Freq: Three times a day (TID) | ORAL | 0 refills | Status: AC | PRN

## 2024-04-05 MED ORDER — HYDROCODONE-ACETAMINOPHEN 5-325 MG PO TABS: 1.0000 | ORAL_TABLET | Freq: Four times a day (QID) | ORAL | 0 refills | Status: DC | PRN

## 2024-04-05 NOTE — Telephone Encounter (Signed)
 Spoke with patient, provider is sending in medication for him. He gave verbal understanding

## 2024-04-05 NOTE — Telephone Encounter (Unsigned)
 Copied from CRM [id number]. Topic: Clinical - Medical Advice >> Apr 05, 2024  4:09 PM Ashley R wrote: Reason for CRM: From previous Reason for Contact - Call Back - No Documentation: Reason for CRM: Returning call from [place]. Callback [phone]

## 2024-04-06 ENCOUNTER — Ambulatory Visit (HOSPITAL_BASED_OUTPATIENT_CLINIC_OR_DEPARTMENT_OTHER): Payer: Self-pay

## 2024-04-09 ENCOUNTER — Telehealth: Payer: Self-pay | Admitting: Urology

## 2024-04-09 ENCOUNTER — Ambulatory Visit: Payer: Self-pay | Admitting: Urology

## 2024-04-09 ENCOUNTER — Encounter: Payer: Self-pay | Admitting: Urology

## 2024-04-09 NOTE — Progress Notes (Signed)
 I called patient.  I have reviewed the images.  He has a moderate-sized stone on the right side.  Also with smaller stone on the left side.  Neither of these is passing.  No significant masses in the kidneys.  We will need to do cystoscopy to further evaluate his hematuria.

## 2024-04-09 NOTE — Telephone Encounter (Signed)
 Pt called would like to go over the results to his CT scan. Please Advise.

## 2024-04-10 NOTE — Telephone Encounter (Signed)
 See previous note

## 2024-04-10 NOTE — Telephone Encounter (Signed)
 [facility] requesting a return call.

## 2024-04-10 NOTE — Telephone Encounter (Signed)
-----   Message from Garnette HERO Dahlstedt sent at 04/09/2024 12:57 PM EST ----- I called patient with CT results.  Please call him to set up cystoscopy finish hematuria evaluation ----- Message ----- From: Interface, Rad Results In Sent: 04/06/2024  10:05 AM EST To: Garnette Shack, MD

## 2024-04-13 ENCOUNTER — Encounter: Payer: Self-pay | Admitting: Family Medicine

## 2024-04-16 ENCOUNTER — Encounter: Payer: Self-pay | Admitting: Family Medicine

## 2024-04-16 ENCOUNTER — Ambulatory Visit (INDEPENDENT_AMBULATORY_CARE_PROVIDER_SITE_OTHER): Payer: Self-pay | Admitting: Family Medicine

## 2024-04-16 VITALS — BP 134/82 | HR 81 | Temp 98.3°F | Ht 74.0 in | Wt >= 6400 oz

## 2024-04-16 DIAGNOSIS — B372 Candidiasis of skin and nail: Secondary | ICD-10-CM

## 2024-04-16 DIAGNOSIS — N200 Calculus of kidney: Secondary | ICD-10-CM

## 2024-04-16 DIAGNOSIS — L0331A Cellulitis of flank: Secondary | ICD-10-CM

## 2024-04-16 MED ORDER — AMOXICILLIN-POT CLAVULANATE 875-125 MG PO TABS: 1.0000 | ORAL_TABLET | Freq: Two times a day (BID) | ORAL | 0 refills | Status: DC

## 2024-04-16 MED ORDER — CEFTRIAXONE SODIUM 1 G IJ SOLR
1.0000 g | Freq: Once | INTRAMUSCULAR | Status: AC
  Administered 2024-04-16: 1 g via INTRAMUSCULAR

## 2024-04-16 MED ORDER — FLUCONAZOLE 150 MG PO TABS: 150.0000 mg | ORAL_TABLET | ORAL | 0 refills | Status: DC

## 2024-04-16 MED ORDER — CLINDAMYCIN HCL 150 MG PO CAPS: 150.0000 mg | ORAL_CAPSULE | Freq: Three times a day (TID) | ORAL | 0 refills | Status: AC

## 2024-04-16 NOTE — Telephone Encounter (Signed)
 Spoke with patient, he states spot looks worse but feels better. Spot does look worse, advised he keep his visit today. Gave a verbal understanding

## 2024-04-16 NOTE — Telephone Encounter (Signed)
 Addressed concerns in OV today

## 2024-04-16 NOTE — Telephone Encounter (Signed)
 Addressed in OV today.

## 2024-04-16 NOTE — Patient Instructions (Addendum)
 I have sent in Clindamycin  for you to take one capsule 3 times per day for 10 days.   You have received an antibiotic injection in the office today.   May use colloidal silver  and manuka honey to the open area to your right flank.  Follow-up with me for new or worsening symptoms.

## 2024-04-16 NOTE — Progress Notes (Signed)
 Acute Office Visit  Subjective:     Patient ID: Larry Fowler, male    DOB: 09-11-1970, 53 y.o.   MRN: [id number]  Chief Complaint  Patient presents with   Acute Visit    Possible staph infection on right side. Noticed it on Wednesday, has had some clear drainage    HPI  53 year old male presents for evaluation of abscess to the right flank.  States this has been there for the last 4 days.  Has been using home doxycycline  twice daily as well as manuka honey to the area with mild improvement. Denies fever, chills, abdominal pain, nausea, vomiting, diarrhea, other symptoms.  ROS Per HPI      Objective:    BP 134/82 (BP Location: Left Arm, Patient Position: Sitting)   Pulse 81   Temp 98.3 F (36.8 C) (Temporal)   Ht 6' 2 (1.88 m)   Wt (!) 457 lb (207.3 kg)   SpO2 95%   BMI 58.68 kg/m    Physical Exam Vitals and nursing note reviewed.  Constitutional:      General: He is not in acute distress. HENT:     Head: Normocephalic and atraumatic.     Right Ear: External ear normal.     Left Ear: External ear normal.     Nose: Nose normal.     Mouth/Throat:     Mouth: Mucous membranes are moist.     Pharynx: Oropharynx is clear.  Eyes:     Extraocular Movements: Extraocular movements intact.  Cardiovascular:     Rate and Rhythm: Normal rate and regular rhythm.     Pulses: Normal pulses.     Heart sounds: Normal heart sounds.  Pulmonary:     Effort: Pulmonary effort is normal. No respiratory distress.     Breath sounds: Normal breath sounds. No wheezing, rhonchi or rales.  Musculoskeletal:     Cervical back: Normal range of motion.     Right lower leg: No edema.     Left lower leg: No edema.     Comments: Using 2 wheel walker for ambulation  Lymphadenopathy:     Cervical: No cervical adenopathy.  Skin:    General: Skin is warm and dry.     Findings: Lesion (See photo) present.  Neurological:     General: No focal deficit present.     Mental Status: He is  alert and oriented to person, place, and time.  Psychiatric:        Mood and Affect: Mood normal.        Behavior: Behavior normal.     No results found for any visits on 04/16/24.      Assessment & Plan:   Cellulitis of flank -     Clindamycin  HCl; Take 1 capsule (150 mg total) by mouth 3 (three) times daily for 10 days.  Dispense: 30 capsule; Refill: 0 -     cefTRIAXone  Sodium  Yeast dermatitis -     Fluconazole ; Take 1 tablet (150 mg total) by mouth once a week for 12 doses.  Dispense: 12 tablet; Refill: 0  Renal calculi   Continue Flomax Follow-up with urology as scheduled      No orders of the defined types were placed in this encounter.    Meds ordered this encounter  Medications   DISCONTD: amoxicillin -clavulanate (AUGMENTIN ) 875-125 MG tablet    Sig: Take 1 tablet by mouth 2 (two) times daily.    Dispense:  20 tablet    Refill:  0   fluconazole  (DIFLUCAN ) 150 MG tablet    Sig: Take 1 tablet (150 mg total) by mouth once a week for 12 doses.    Dispense:  12 tablet    Refill:  0   clindamycin  (CLEOCIN ) 150 MG capsule    Sig: Take 1 capsule (150 mg total) by mouth 3 (three) times daily for 10 days.    Dispense:  30 capsule    Refill:  0   cefTRIAXone  (ROCEPHIN ) injection 1 g    Return if symptoms worsen or fail to improve.  Corean LITTIE Ku, FNP

## 2024-04-19 ENCOUNTER — Encounter: Payer: Self-pay | Admitting: Family Medicine

## 2024-04-19 NOTE — Telephone Encounter (Signed)
 Copied from CRM #[id number]. Topic: Referral - Question >> Apr 19, 2024  3:27 PM Nessti S wrote: Reason for CRM: pt called because he sent message via mychart concerning referral and would like a call back [phone]   ----------------------------------------------------------------------- From previous Reason for Contact - Medical Advice: Reason for CRM:

## 2024-04-24 ENCOUNTER — Other Ambulatory Visit: Payer: Self-pay

## 2024-04-26 ENCOUNTER — Other Ambulatory Visit: Payer: Self-pay

## 2024-04-26 DIAGNOSIS — M545 Low back pain, unspecified: Secondary | ICD-10-CM

## 2024-04-26 DIAGNOSIS — N200 Calculus of kidney: Secondary | ICD-10-CM

## 2024-04-26 DIAGNOSIS — L0331A Cellulitis of flank: Secondary | ICD-10-CM

## 2024-05-01 ENCOUNTER — Other Ambulatory Visit: Payer: Self-pay | Admitting: Family

## 2024-05-01 DIAGNOSIS — R310 Gross hematuria: Secondary | ICD-10-CM

## 2024-05-11 ENCOUNTER — Telehealth: Payer: Self-pay

## 2024-05-11 ENCOUNTER — Other Ambulatory Visit: Payer: Self-pay

## 2024-05-11 DIAGNOSIS — R310 Gross hematuria: Secondary | ICD-10-CM

## 2024-05-11 MED ORDER — TAMSULOSIN HCL 0.4 MG PO CAPS: 0.4000 mg | ORAL_CAPSULE | Freq: Every day | ORAL | 1 refills | Status: DC

## 2024-05-11 NOTE — Telephone Encounter (Signed)
 Copied from CRM [id number]. Topic: Clinical - Medication Question >> May 11, 2024 11:59 AM Harlene ORN wrote: Reason for CRM: [facility] Sent PCP a message the other day about receiving a refill of his Flomax  Capsules, wanted to follow up on refill progress. Medication was refused, but unable to find the reason why. Please call back the Pharmacy to discuss reason for refusal. Phone:  [phone]

## 2024-05-14 ENCOUNTER — Other Ambulatory Visit: Payer: Self-pay | Admitting: Family Medicine

## 2024-05-14 DIAGNOSIS — N200 Calculus of kidney: Secondary | ICD-10-CM

## 2024-05-14 MED ORDER — AMOXICILLIN-POT CLAVULANATE 875-125 MG PO TABS: 1.0000 | ORAL_TABLET | Freq: Two times a day (BID) | ORAL | 0 refills | Status: DC

## 2024-05-14 MED ORDER — HYDROCODONE-ACETAMINOPHEN 5-325 MG PO TABS: 1.0000 | ORAL_TABLET | Freq: Four times a day (QID) | ORAL | 0 refills | Status: DC | PRN

## 2024-05-14 NOTE — Progress Notes (Signed)
 Spoke on the phone with patient's wife Jerilee, on [place] about pt running fever over the weekend. States that pain has increased and thinks that kidney stone is moving. Concerned for infection given fever and increased pain.  Augmentin  BID x 7 days to pharmacy.

## 2024-05-16 ENCOUNTER — Ambulatory Visit: Payer: Self-pay

## 2024-05-16 NOTE — Telephone Encounter (Signed)
 FYI Only or Action Required?: Action required by provider: Requesting blood work order.  Patient was last seen in primary care on 04/16/2024 by Alvia Corean CROME, FNP.  Called Nurse Triage reporting Flank Pain.  Symptoms began several days ago.  Interventions attempted: OTC medications: Tylenol  and Prescription medications: Augemtin .  Symptoms are: stable.  Triage Disposition: Call PCP When Office is Open  Patient/caregiver understands and will follow disposition?: Yes Reason for Disposition  [1] Caller requesting NON-URGENT health information AND [2] PCP's office is the best resource  Answer Assessment - Initial Assessment Questions Patient explains he does not want to sit in the ED for something that can be treated in the doctors. He's self pay. Patient states he was looking to speak to [place] to get blood work ordered, he does not know what blood work, anything they think is necessary so he can get it all done in one place. Called [facility] spoke to [place], she stated she was unable to reach [place] and the rest of the clinical team is currently seeing patients. I advised patient I will send a message over to see if any blood work needs to be ordered. He verbalized understanding, stated he is going to take a shower and go get the Cytology done and if there is a blood work order then, he will get it. Please advise.   1. REASON FOR CALL: What is the main reason for your call? or How can I best help you?     Patient was calling in to speak to [facility] nurse, he wants to get blood work done when he gets cytology done today.   2. SYMPTOMS : Do you have any symptoms?      Feels like he's got a kidney stone on the move, temp today at 9am 99.7  Protocols used: Information Only Call - No Triage-A-AH  Copied from CRM [id number]. Topic: Clinical - Red Word Triage >> May 16, 2024  8:36 AM Vena HERO wrote: Red Word that prompted transfer to Nurse Triage: low grade fever for 3 days  97.9-99.9-101-101.8-102, potential kidney stone, good urine flow-no blood, started on 12/02 and went away but started back on 12/07. Augmentin  and Tylenol -severe pain on Monday but mild now

## 2024-05-17 ENCOUNTER — Other Ambulatory Visit: Payer: Self-pay

## 2024-05-17 ENCOUNTER — Ambulatory Visit: Payer: Self-pay | Admitting: Family Medicine

## 2024-05-17 ENCOUNTER — Other Ambulatory Visit: Payer: Self-pay | Admitting: Family Medicine

## 2024-05-17 DIAGNOSIS — M545 Low back pain, unspecified: Secondary | ICD-10-CM

## 2024-05-17 DIAGNOSIS — R10A3 Flank pain, bilateral: Secondary | ICD-10-CM

## 2024-05-17 DIAGNOSIS — R509 Fever, unspecified: Secondary | ICD-10-CM

## 2024-05-17 DIAGNOSIS — N200 Calculus of kidney: Secondary | ICD-10-CM

## 2024-05-17 DIAGNOSIS — L0331A Cellulitis of flank: Secondary | ICD-10-CM

## 2024-05-17 DIAGNOSIS — E1165 Type 2 diabetes mellitus with hyperglycemia: Secondary | ICD-10-CM

## 2024-05-17 DIAGNOSIS — R310 Gross hematuria: Secondary | ICD-10-CM

## 2024-05-17 LAB — COMPREHENSIVE METABOLIC PANEL WITH GFR
ALT: 21 U/L (ref 0–53)
AST: 20 U/L (ref 0–37)
Albumin: 3.9 g/dL (ref 3.5–5.2)
Alkaline Phosphatase: 79 U/L (ref 39–117)
BUN: 9 mg/dL (ref 6–23)
CO2: 29 meq/L (ref 19–32)
Calcium: 9.5 mg/dL (ref 8.4–10.5)
Chloride: 93 meq/L — ABNORMAL LOW (ref 96–112)
Creatinine, Ser: 0.58 mg/dL (ref 0.40–1.50)
GFR: 112.41 mL/min (ref >60.00)
Glucose, Bld: 463 mg/dL — ABNORMAL HIGH (ref 70–99)
Potassium: 4.2 meq/L (ref 3.5–5.1)
Sodium: 132 meq/L — ABNORMAL LOW (ref 135–145)
Total Bilirubin: 0.5 mg/dL (ref 0.2–1.2)
Total Protein: 6.9 g/dL (ref 6.0–8.3)

## 2024-05-17 LAB — CBC WITH DIFFERENTIAL/PLATELET
Basophils Absolute: 0 K/uL (ref 0.0–0.1)
Basophils Relative: 0.3 % (ref 0.0–3.0)
Eosinophils Absolute: 0 K/uL (ref 0.0–0.7)
Eosinophils Relative: 0.6 % (ref 0.0–5.0)
HCT: 42.5 % (ref 39.0–52.0)
Hemoglobin: 14.6 g/dL (ref 13.0–17.0)
Lymphocytes Relative: 38.5 % (ref 12.0–46.0)
Lymphs Abs: 2.7 K/uL (ref 0.7–4.0)
MCHC: 34.3 g/dL (ref 30.0–36.0)
MCV: 90.8 fl (ref 78.0–100.0)
Monocytes Absolute: 0.8 K/uL (ref 0.1–1.0)
Monocytes Relative: 10.8 % (ref 3.0–12.0)
Neutro Abs: 3.5 K/uL (ref 1.4–7.7)
Neutrophils Relative %: 49.8 % (ref 43.0–77.0)
Platelets: 179 K/uL (ref 150.0–400.0)
RBC: 4.68 Mil/uL (ref 4.22–5.81)
RDW: 14.1 % (ref 11.5–15.5)
WBC: 7.1 K/uL (ref 4.0–10.5)

## 2024-05-17 LAB — HEMOGLOBIN A1C: Hgb A1c MFr Bld: 13.7 % — ABNORMAL HIGH (ref 4.6–6.5)

## 2024-05-17 LAB — CK: Total CK: 24 U/L (ref 17–232)

## 2024-05-17 NOTE — Telephone Encounter (Signed)
 Referral faxed

## 2024-05-17 NOTE — Telephone Encounter (Signed)
 Spoke with patient, PCP placed labs for patient to have done. Suggests patient see another urologist as he was not a fan of the previous one. Patient agreeable to another urology referral.   He states it is the stone on the left side that is giving him issues, says he has a 3mm stone on the L side also.

## 2024-05-18 ENCOUNTER — Other Ambulatory Visit (HOSPITAL_COMMUNITY): Payer: Self-pay

## 2024-05-18 ENCOUNTER — Other Ambulatory Visit: Payer: Self-pay

## 2024-05-18 DIAGNOSIS — R310 Gross hematuria: Secondary | ICD-10-CM

## 2024-05-18 DIAGNOSIS — J4530 Mild persistent asthma, uncomplicated: Secondary | ICD-10-CM

## 2024-05-18 DIAGNOSIS — E1165 Type 2 diabetes mellitus with hyperglycemia: Secondary | ICD-10-CM

## 2024-05-18 LAB — URINE CULTURE: Result:: NO GROWTH

## 2024-05-18 MED ORDER — TAMSULOSIN HCL 0.4 MG PO CAPS
0.4000 mg | ORAL_CAPSULE | Freq: Every day | ORAL | 1 refills | Status: AC
  Filled 2024-05-18: qty 30, 30d supply, fill #0

## 2024-05-18 MED ORDER — PIOGLITAZONE HCL 30 MG PO TABS
30.0000 mg | ORAL_TABLET | Freq: Every day | ORAL | 1 refills | Status: AC
  Filled 2024-05-18: qty 90, 90d supply, fill #0

## 2024-05-18 MED ORDER — ALBUTEROL SULFATE HFA 108 (90 BASE) MCG/ACT IN AERS
2.0000 | INHALATION_SPRAY | Freq: Four times a day (QID) | RESPIRATORY_TRACT | 3 refills | Status: DC | PRN
  Filled 2024-05-18: qty 18, 20d supply, fill #0

## 2024-05-18 MED ORDER — AMOXICILLIN-POT CLAVULANATE 875-125 MG PO TABS: 1.0000 | ORAL_TABLET | Freq: Two times a day (BID) | ORAL | 0 refills | Status: AC

## 2024-06-11 ENCOUNTER — Encounter: Payer: Self-pay | Admitting: Family Medicine

## 2024-06-11 ENCOUNTER — Ambulatory Visit: Payer: Self-pay

## 2024-06-11 ENCOUNTER — Ambulatory Visit: Payer: Self-pay | Admitting: Family Medicine

## 2024-06-11 VITALS — BP 138/78 | HR 78 | Temp 97.9°F | Resp 18 | Ht 74.0 in | Wt >= 6400 oz

## 2024-06-11 DIAGNOSIS — R1010 Upper abdominal pain, unspecified: Secondary | ICD-10-CM

## 2024-06-11 DIAGNOSIS — E1165 Type 2 diabetes mellitus with hyperglycemia: Secondary | ICD-10-CM

## 2024-06-11 DIAGNOSIS — J4531 Mild persistent asthma with (acute) exacerbation: Secondary | ICD-10-CM

## 2024-06-11 DIAGNOSIS — J069 Acute upper respiratory infection, unspecified: Secondary | ICD-10-CM

## 2024-06-11 MED ORDER — METHYLPREDNISOLONE ACETATE 80 MG/ML IJ SUSP
80.0000 mg | Freq: Once | INTRAMUSCULAR | Status: AC
  Administered 2024-06-11: 80 mg via INTRAMUSCULAR

## 2024-06-11 MED ORDER — BENZONATATE 200 MG PO CAPS: 200.0000 mg | ORAL_CAPSULE | Freq: Three times a day (TID) | ORAL | 0 refills | Status: AC | PRN

## 2024-06-11 MED ORDER — IPRATROPIUM-ALBUTEROL 0.5-2.5 (3) MG/3ML IN SOLN
3.0000 mL | Freq: Once | RESPIRATORY_TRACT | Status: AC
  Administered 2024-06-11: 3 mL via RESPIRATORY_TRACT

## 2024-06-11 MED ORDER — BENZONATATE 200 MG PO CAPS: 200.0000 mg | ORAL_CAPSULE | Freq: Three times a day (TID) | ORAL | 0 refills | Status: DC | PRN

## 2024-06-11 NOTE — Telephone Encounter (Signed)
 FYI Only or Action Required?: FYI only for provider: appointment scheduled on 15/26.  Patient was last seen in primary care on 04/16/2024 by Alvia Corean CROME, FNP.  Called Nurse Triage reporting Cough.  Symptoms began several days ago.  Interventions attempted: OTC medications: Tylenol , Delsyn.  Symptoms are: unchanged.  Triage Disposition: See HCP Within 4 Hours (Or PCP Triage)  Patient/caregiver understands and will follow disposition?: Yes           Copied from CRM [id number]. Topic: Clinical - Red Word Triage >> Jun 11, 2024  9:13 AM Treva T wrote: [facility] that prompted transfer to Nurse Triage: Pt spouse, Jerilee, states pt is having difficulty breathing, coughing, running low grade fevers, but think fever broke last night.  Also reports urine is dark colored.   Per pt spouse both has had flu since December, during Christmas time.   Requesting an appt for evaluation. Reason for Disposition  Wheezing is present  Answer Assessment - Initial Assessment Questions 1. ONSET: When did the cough begin?      X 3 days   2. SEVERITY: How bad is the cough today?      Intermittent   3. SPUTUM: Describe the color of your sputum (e.g., none, dry cough; clear, white, yellow, green)     Both dry and productive, currently dry  4. HEMOPTYSIS: Are you coughing up any blood? If Yes, ask: How much? (e.g., flecks, streaks, tablespoons, etc.)     No   5. DIFFICULTY BREATHING: Are you having difficulty breathing? If Yes, ask: How bad is it? (e.g., mild, moderate, severe)        6. FEVER: Do you have a fever? If Yes, ask: What is your temperature, how was it measured, and when did it start?     Low grade   7. CARDIAC HISTORY: Do you have any history of heart disease? (e.g., heart attack, congestive heart failure)      HTN  8. LUNG HISTORY: Do you have any history of lung disease?  (e.g., pulmonary embolus, asthma, emphysema)     Asthma, using inhaler     10. OTHER SYMPTOMS: Do you have any other symptoms? (e.g., runny nose, wheezing, chest pain)       Wheezing noted, dark urine x 1 day.     Patient called in to triage with complaints of cough, wheezing, low grade fever, dark colored urine.  This has been ongoing for 3 days. The patient stated he was dx. With Flu a day after Christmas-symptoms persist.  For home care, the patient is taking Tylenol , Delsyn.  Appointment scheduled for further evaluation; Patient agrees with the plan of care, and will reach out if symptoms worsen or persist.  Protocols used: Cough - Acute Non-Productive-A-AH

## 2024-06-12 ENCOUNTER — Encounter (HOSPITAL_COMMUNITY): Payer: Self-pay

## 2024-06-12 ENCOUNTER — Other Ambulatory Visit: Payer: Self-pay

## 2024-06-12 ENCOUNTER — Inpatient Hospital Stay (HOSPITAL_COMMUNITY)
Admission: EM | Admit: 2024-06-12 | Discharge: 2024-06-17 | DRG: 871 | Disposition: A | Discharge status: Home / Self Care | Payer: Self-pay | Attending: Internal Medicine | Admitting: Internal Medicine

## 2024-06-12 ENCOUNTER — Emergency Department (HOSPITAL_COMMUNITY): Payer: Self-pay

## 2024-06-12 DIAGNOSIS — R7881 Bacteremia: Secondary | ICD-10-CM

## 2024-06-12 DIAGNOSIS — D689 Coagulation defect, unspecified: Secondary | ICD-10-CM | POA: Diagnosis present

## 2024-06-12 DIAGNOSIS — B179 Acute viral hepatitis, unspecified: Principal | ICD-10-CM | POA: Diagnosis present

## 2024-06-12 DIAGNOSIS — Z7984 Long term (current) use of oral hypoglycemic drugs: Secondary | ICD-10-CM

## 2024-06-12 DIAGNOSIS — R6520 Severe sepsis without septic shock: Secondary | ICD-10-CM | POA: Diagnosis present

## 2024-06-12 DIAGNOSIS — Z59819 Housing instability, housed unspecified: Secondary | ICD-10-CM

## 2024-06-12 DIAGNOSIS — A419 Sepsis, unspecified organism: Secondary | ICD-10-CM

## 2024-06-12 DIAGNOSIS — E1165 Type 2 diabetes mellitus with hyperglycemia: Secondary | ICD-10-CM | POA: Diagnosis present

## 2024-06-12 DIAGNOSIS — N400 Enlarged prostate without lower urinary tract symptoms: Secondary | ICD-10-CM | POA: Diagnosis present

## 2024-06-12 DIAGNOSIS — Z86711 Personal history of pulmonary embolism: Secondary | ICD-10-CM

## 2024-06-12 DIAGNOSIS — Z888 Allergy status to other drugs, medicaments and biological substances status: Secondary | ICD-10-CM

## 2024-06-12 DIAGNOSIS — Z59868 Other specified financial insecurity: Secondary | ICD-10-CM

## 2024-06-12 DIAGNOSIS — Z79899 Other long term (current) drug therapy: Secondary | ICD-10-CM

## 2024-06-12 DIAGNOSIS — J45909 Unspecified asthma, uncomplicated: Secondary | ICD-10-CM | POA: Diagnosis present

## 2024-06-12 DIAGNOSIS — G4733 Obstructive sleep apnea (adult) (pediatric): Secondary | ICD-10-CM | POA: Diagnosis present

## 2024-06-12 DIAGNOSIS — K7200 Acute and subacute hepatic failure without coma: Secondary | ICD-10-CM | POA: Diagnosis present

## 2024-06-12 DIAGNOSIS — R7689 Other specified abnormal immunological findings in serum: Secondary | ICD-10-CM | POA: Diagnosis present

## 2024-06-12 DIAGNOSIS — I480 Paroxysmal atrial fibrillation: Secondary | ICD-10-CM | POA: Diagnosis present

## 2024-06-12 DIAGNOSIS — Z811 Family history of alcohol abuse and dependence: Secondary | ICD-10-CM

## 2024-06-12 DIAGNOSIS — B961 Klebsiella pneumoniae [K. pneumoniae] as the cause of diseases classified elsewhere: Secondary | ICD-10-CM | POA: Diagnosis present

## 2024-06-12 DIAGNOSIS — R7989 Other specified abnormal findings of blood chemistry: Secondary | ICD-10-CM | POA: Diagnosis present

## 2024-06-12 DIAGNOSIS — Z87891 Personal history of nicotine dependence: Secondary | ICD-10-CM

## 2024-06-12 DIAGNOSIS — E860 Dehydration: Secondary | ICD-10-CM | POA: Diagnosis present

## 2024-06-12 DIAGNOSIS — A4159 Other Gram-negative sepsis: Principal | ICD-10-CM | POA: Diagnosis present

## 2024-06-12 DIAGNOSIS — Z7901 Long term (current) use of anticoagulants: Secondary | ICD-10-CM

## 2024-06-12 DIAGNOSIS — Z6841 Body Mass Index (BMI) 40.0 and over, adult: Secondary | ICD-10-CM

## 2024-06-12 DIAGNOSIS — Z89021 Acquired absence of right finger(s): Secondary | ICD-10-CM

## 2024-06-12 DIAGNOSIS — I2692 Saddle embolus of pulmonary artery without acute cor pulmonale: Secondary | ICD-10-CM | POA: Diagnosis present

## 2024-06-12 DIAGNOSIS — E872 Acidosis, unspecified: Secondary | ICD-10-CM | POA: Diagnosis present

## 2024-06-12 DIAGNOSIS — Z7951 Long term (current) use of inhaled steroids: Secondary | ICD-10-CM

## 2024-06-12 DIAGNOSIS — N12 Tubulo-interstitial nephritis, not specified as acute or chronic: Secondary | ICD-10-CM | POA: Diagnosis present

## 2024-06-12 DIAGNOSIS — Z8419 Family history of other disorders of kidney and ureter: Secondary | ICD-10-CM

## 2024-06-12 DIAGNOSIS — Z86718 Personal history of other venous thrombosis and embolism: Secondary | ICD-10-CM

## 2024-06-12 DIAGNOSIS — Z833 Family history of diabetes mellitus: Secondary | ICD-10-CM

## 2024-06-12 DIAGNOSIS — E119 Type 2 diabetes mellitus without complications: Secondary | ICD-10-CM

## 2024-06-12 DIAGNOSIS — N200 Calculus of kidney: Secondary | ICD-10-CM | POA: Diagnosis present

## 2024-06-12 LAB — CBC WITH DIFFERENTIAL/PLATELET
Abs Immature Granulocytes: 0.03 K/uL (ref 0.00–0.07)
Basophils Absolute: 0 K/uL (ref 0.0–0.1)
Basophils Relative: 0 %
Eosinophils Absolute: 0 K/uL (ref 0.0–0.5)
Eosinophils Relative: 0 %
HCT: 44.8 % (ref 39.0–52.0)
Hemoglobin: 15 g/dL (ref 13.0–17.0)
Immature Granulocytes: 0 %
Lymphocytes Relative: 14 %
Lymphs Abs: 1.1 K/uL (ref 0.7–4.0)
MCH: 30.9 pg (ref 26.0–34.0)
MCHC: 33.5 g/dL (ref 30.0–36.0)
MCV: 92.4 fL (ref 80.0–100.0)
Monocytes Absolute: 0.6 K/uL (ref 0.1–1.0)
Monocytes Relative: 7 %
Neutro Abs: 6.2 K/uL (ref 1.7–7.7)
Neutrophils Relative %: 79 %
Platelets: 132 K/uL — ABNORMAL LOW (ref 150–400)
RBC: 4.85 MIL/uL (ref 4.22–5.81)
RDW: 13.8 % (ref 11.5–15.5)
WBC: 7.9 K/uL (ref 4.0–10.5)
nRBC: 0 % (ref 0.0–0.2)

## 2024-06-12 LAB — URINALYSIS, ROUTINE W REFLEX MICROSCOPIC
Bilirubin Urine: NEGATIVE
Glucose, UA: >=500 mg/dL — AB
Ketones, ur: 20 mg/dL — AB
Nitrite: NEGATIVE
Protein, ur: 30 mg/dL — AB
Specific Gravity, Urine: 1.02 (ref 1.005–1.030)
WBC, UA: >50 WBC/hpf (ref 0–5)
pH: 6 (ref 5.0–8.0)

## 2024-06-12 LAB — COMPREHENSIVE METABOLIC PANEL WITH GFR
ALT: 4543 U/L — ABNORMAL HIGH (ref 0–44)
AST: 3742 U/L — ABNORMAL HIGH (ref 15–41)
Albumin: 3.4 g/dL — ABNORMAL LOW (ref 3.5–5.0)
Alkaline Phosphatase: 223 U/L — ABNORMAL HIGH (ref 38–126)
Anion gap: 18 — ABNORMAL HIGH (ref 5–15)
BUN: 13 mg/dL (ref 6–20)
CO2: 19 mmol/L — ABNORMAL LOW (ref 22–32)
Calcium: 9 mg/dL (ref 8.9–10.3)
Chloride: 85 mmol/L — ABNORMAL LOW (ref 98–111)
Creatinine, Ser: 0.65 mg/dL (ref 0.61–1.24)
GFR, Estimated: >60 mL/min
Glucose, Bld: 468 mg/dL — ABNORMAL HIGH (ref 70–99)
Potassium: 4.4 mmol/L (ref 3.5–5.1)
Sodium: 121 mmol/L — ABNORMAL LOW (ref 135–145)
Total Bilirubin: 8 mg/dL — ABNORMAL HIGH (ref 0.0–1.2)
Total Protein: 6.2 g/dL — ABNORMAL LOW (ref 6.5–8.1)

## 2024-06-12 LAB — LIPASE, BLOOD: Lipase: 33 U/L (ref 11–51)

## 2024-06-12 MED ORDER — ALUM & MAG HYDROXIDE-SIMETH 200-200-20 MG/5ML PO SUSP
30.0000 mL | Freq: Once | ORAL | Status: AC
  Administered 2024-06-12: 30 mL via ORAL
  Filled 2024-06-12: qty 30

## 2024-06-12 NOTE — ED Triage Notes (Signed)
 POV/ brought in by wife/ sent by PCP for poss dehydration/ dark urine/ recently dx with flu/ unable to hold liquids and food down/ pt is A&OX4

## 2024-06-12 NOTE — ED Provider Triage Note (Signed)
 Emergency Medicine Provider Triage Evaluation Note  Larry Fowler , a 54 y.o. male  was evaluated in triage.  Pt complains of potential dehydration.  Notes had the flu for 9 days.  Initially had fevers but those have improved.  States still feeling fatigued.  Notes he is having pain in his stomach and difficulty eating/drinking secondary to this.  Notes nausea but denies vomiting.  Review of Systems  Positive: Nausea, epigastric pain, cough Negative: Fevers, headache, dizziness, chest pain, shortness of breath, diarrhea  Physical Exam  BP 139/73 (BP Location: Left Arm)   Pulse 95   Temp 97.9 F (36.6 C)   Resp 19   SpO2 90%  Gen:   Awake, no distress   Resp:  Normal effort  MSK:   Moves extremities without difficulty  Other:    Medical Decision Making  Medically screening exam initiated at 2:33 PM.  Appropriate orders placed.  Larry Fowler was informed that the remainder of the evaluation will be completed by another provider, this initial triage assessment does not replace that evaluation, and the importance of remaining in the ED until their evaluation is complete.     Neysa Thersia GORMAN, PA-C 06/12/24 1435

## 2024-06-13 ENCOUNTER — Emergency Department (HOSPITAL_COMMUNITY): Payer: Self-pay

## 2024-06-13 ENCOUNTER — Other Ambulatory Visit (HOSPITAL_COMMUNITY): Payer: Self-pay

## 2024-06-13 DIAGNOSIS — J9600 Acute respiratory failure, unspecified whether with hypoxia or hypercapnia: Secondary | ICD-10-CM

## 2024-06-13 DIAGNOSIS — E871 Hypo-osmolality and hyponatremia: Secondary | ICD-10-CM

## 2024-06-13 DIAGNOSIS — A419 Sepsis, unspecified organism: Secondary | ICD-10-CM

## 2024-06-13 DIAGNOSIS — B179 Acute viral hepatitis, unspecified: Principal | ICD-10-CM

## 2024-06-13 DIAGNOSIS — N10 Acute tubulo-interstitial nephritis: Secondary | ICD-10-CM

## 2024-06-13 DIAGNOSIS — Z86711 Personal history of pulmonary embolism: Secondary | ICD-10-CM

## 2024-06-13 DIAGNOSIS — R7989 Other specified abnormal findings of blood chemistry: Secondary | ICD-10-CM | POA: Diagnosis present

## 2024-06-13 DIAGNOSIS — J09X9 Influenza due to identified novel influenza A virus with other manifestations: Secondary | ICD-10-CM

## 2024-06-13 DIAGNOSIS — E1165 Type 2 diabetes mellitus with hyperglycemia: Secondary | ICD-10-CM

## 2024-06-13 DIAGNOSIS — R6520 Severe sepsis without septic shock: Secondary | ICD-10-CM

## 2024-06-13 LAB — BLOOD CULTURE ID PANEL (REFLEXED) - BCID2

## 2024-06-13 LAB — I-STAT CG4 LACTIC ACID, ED
Lactic Acid, Venous: 2.9 mmol/L (ref 0.5–1.9)
Lactic Acid, Venous: 3.6 mmol/L (ref 0.5–1.9)

## 2024-06-13 LAB — COMPREHENSIVE METABOLIC PANEL WITH GFR
ALT: 2842 U/L — ABNORMAL HIGH (ref 0–44)
AST: 1249 U/L — ABNORMAL HIGH (ref 15–41)
Albumin: 3.1 g/dL — ABNORMAL LOW (ref 3.5–5.0)
Alkaline Phosphatase: 214 U/L — ABNORMAL HIGH (ref 38–126)
Anion gap: 14 (ref 5–15)
BUN: 16 mg/dL (ref 6–20)
CO2: 21 mmol/L — ABNORMAL LOW (ref 22–32)
Calcium: 8.5 mg/dL — ABNORMAL LOW (ref 8.9–10.3)
Chloride: 87 mmol/L — ABNORMAL LOW (ref 98–111)
Creatinine, Ser: 0.55 mg/dL — ABNORMAL LOW (ref 0.61–1.24)
GFR, Estimated: >60 mL/min
Glucose, Bld: 447 mg/dL — ABNORMAL HIGH (ref 70–99)
Potassium: 4.4 mmol/L (ref 3.5–5.1)
Sodium: 122 mmol/L — ABNORMAL LOW (ref 135–145)
Total Bilirubin: 9.7 mg/dL — ABNORMAL HIGH (ref 0.0–1.2)
Total Protein: 5.7 g/dL — ABNORMAL LOW (ref 6.5–8.1)

## 2024-06-13 LAB — PROTIME-INR
INR: 1.8 — ABNORMAL HIGH (ref 0.8–1.2)
Prothrombin Time: 21.5 s — ABNORMAL HIGH (ref 11.4–15.2)

## 2024-06-13 LAB — CBG MONITORING, ED
Glucose-Capillary: 464 mg/dL — ABNORMAL HIGH (ref 70–99)
Glucose-Capillary: 313 mg/dL — ABNORMAL HIGH (ref 70–99)

## 2024-06-13 LAB — HEPATITIS PANEL, ACUTE
HCV Ab: NONREACTIVE
Hep A IgM: NONREACTIVE
Hep B C IgM: NONREACTIVE
Hepatitis B Surface Ag: NONREACTIVE

## 2024-06-13 LAB — GLUCOSE, CAPILLARY
Glucose-Capillary: 394 mg/dL — ABNORMAL HIGH (ref 70–99)
Glucose-Capillary: 382 mg/dL — ABNORMAL HIGH (ref 70–99)

## 2024-06-13 LAB — HEMOGLOBIN A1C
Hgb A1c MFr Bld: 13.3 % — ABNORMAL HIGH (ref 4.8–5.6)
Mean Plasma Glucose: 335.01 mg/dL

## 2024-06-13 LAB — ACETAMINOPHEN LEVEL: Acetaminophen (Tylenol), Serum: <10 ug/mL — ABNORMAL LOW (ref 10–30)

## 2024-06-13 MED ORDER — INSULIN ASPART 100 UNIT/ML IJ SOLN
0.0000 [IU] | Freq: Every day | INTRAMUSCULAR | Status: DC
  Administered 2024-06-13: 5 [IU] via SUBCUTANEOUS
  Administered 2024-06-14: 4 [IU] via SUBCUTANEOUS
  Filled 2024-06-13: qty 4
  Filled 2024-06-13: qty 2
  Filled 2024-06-13: qty 5

## 2024-06-13 MED ORDER — APIXABAN 5 MG PO TABS
5.0000 mg | ORAL_TABLET | Freq: Two times a day (BID) | ORAL | Status: DC
  Administered 2024-06-13 – 2024-06-14 (×3): 5 mg via ORAL
  Filled 2024-06-13 (×4): qty 1

## 2024-06-13 MED ORDER — INSULIN GLARGINE 100 UNIT/ML ~~LOC~~ SOLN
30.0000 [IU] | Freq: Every day | SUBCUTANEOUS | Status: DC
  Administered 2024-06-14: 30 [IU] via SUBCUTANEOUS
  Filled 2024-06-13: qty 0.3

## 2024-06-13 MED ORDER — PIPERACILLIN-TAZOBACTAM 3.375 G IVPB 30 MIN
3.3750 g | Freq: Once | INTRAVENOUS | Status: AC
  Administered 2024-06-13: 3.375 g via INTRAVENOUS
  Filled 2024-06-13: qty 50

## 2024-06-13 MED ORDER — PIPERACILLIN-TAZOBACTAM 3.375 G IVPB
3.3750 g | Freq: Three times a day (TID) | INTRAVENOUS | Status: DC
  Administered 2024-06-13 (×2): 3.375 g via INTRAVENOUS
  Filled 2024-06-13 (×3): qty 50

## 2024-06-13 MED ORDER — HYDROCOD POLI-CHLORPHE POLI ER 10-8 MG/5ML PO SUER
5.0000 mL | Freq: Two times a day (BID) | ORAL | Status: DC
  Administered 2024-06-13 – 2024-06-16 (×7): 5 mL via ORAL
  Filled 2024-06-13 (×7): qty 5

## 2024-06-13 MED ORDER — SODIUM CHLORIDE 0.9 % IV SOLN: INTRAVENOUS | Status: DC

## 2024-06-13 MED ORDER — SODIUM CHLORIDE 0.9 % IV BOLUS
1000.0000 mL | Freq: Once | INTRAVENOUS | Status: AC
  Administered 2024-06-13: 1000 mL via INTRAVENOUS

## 2024-06-13 MED ORDER — IOHEXOL 350 MG/ML SOLN
100.0000 mL | Freq: Once | INTRAVENOUS | Status: AC | PRN
  Administered 2024-06-13: 100 mL via INTRAVENOUS

## 2024-06-13 MED ORDER — BENZONATATE 100 MG PO CAPS
200.0000 mg | ORAL_CAPSULE | Freq: Once | ORAL | Status: AC
  Administered 2024-06-13: 200 mg via ORAL
  Filled 2024-06-13: qty 2

## 2024-06-13 MED ORDER — TAMSULOSIN HCL 0.4 MG PO CAPS
0.4000 mg | ORAL_CAPSULE | Freq: Every day | ORAL | Status: DC
  Administered 2024-06-13 – 2024-06-17 (×5): 0.4 mg via ORAL
  Filled 2024-06-13 (×6): qty 1

## 2024-06-13 MED ORDER — ONDANSETRON HCL 4 MG/2ML IJ SOLN: 4.0000 mg | Freq: Four times a day (QID) | INTRAMUSCULAR | Status: DC | PRN

## 2024-06-13 MED ORDER — INSULIN GLARGINE 100 UNIT/ML ~~LOC~~ SOLN
10.0000 [IU] | Freq: Once | SUBCUTANEOUS | Status: AC
  Administered 2024-06-13: 10 [IU] via SUBCUTANEOUS
  Filled 2024-06-13: qty 0.1

## 2024-06-13 MED ORDER — INSULIN ASPART 100 UNIT/ML IJ SOLN
0.0000 [IU] | Freq: Three times a day (TID) | INTRAMUSCULAR | Status: DC
  Administered 2024-06-13: 15 [IU] via SUBCUTANEOUS
  Administered 2024-06-13: 20 [IU] via SUBCUTANEOUS
  Administered 2024-06-13: 15 [IU] via SUBCUTANEOUS
  Administered 2024-06-14 (×2): 20 [IU] via SUBCUTANEOUS
  Administered 2024-06-14: 15 [IU] via SUBCUTANEOUS
  Administered 2024-06-15: 4 [IU] via SUBCUTANEOUS
  Administered 2024-06-15: 15 [IU] via SUBCUTANEOUS
  Administered 2024-06-15: 11 [IU] via SUBCUTANEOUS
  Administered 2024-06-16: 4 [IU] via SUBCUTANEOUS
  Administered 2024-06-16: 7 [IU] via SUBCUTANEOUS
  Administered 2024-06-16: 11 [IU] via SUBCUTANEOUS
  Administered 2024-06-17 (×2): 7 [IU] via SUBCUTANEOUS
  Filled 2024-06-13 (×2): qty 7
  Filled 2024-06-13: qty 5
  Filled 2024-06-13: qty 11
  Filled 2024-06-13: qty 20
  Filled 2024-06-13: qty 15
  Filled 2024-06-13: qty 11
  Filled 2024-06-13: qty 7
  Filled 2024-06-13: qty 10
  Filled 2024-06-13 (×2): qty 15

## 2024-06-13 MED ORDER — INSULIN ASPART 100 UNIT/ML IJ SOLN
7.0000 [IU] | Freq: Three times a day (TID) | INTRAMUSCULAR | Status: DC
  Administered 2024-06-13 – 2024-06-14 (×4): 7 [IU] via SUBCUTANEOUS
  Filled 2024-06-13: qty 7

## 2024-06-13 MED ORDER — GUAIFENESIN 100 MG/5ML PO LIQD
5.0000 mL | ORAL | Status: DC | PRN
  Administered 2024-06-13 – 2024-06-14 (×2): 5 mL via ORAL
  Filled 2024-06-13 (×2): qty 15

## 2024-06-13 MED ORDER — SODIUM CHLORIDE 0.9 % IV SOLN
2.0000 g | INTRAVENOUS | Status: DC
  Administered 2024-06-13 – 2024-06-14 (×2): 2 g via INTRAVENOUS
  Filled 2024-06-13 (×2): qty 20

## 2024-06-13 MED ORDER — INSULIN GLARGINE 100 UNIT/ML ~~LOC~~ SOLN
20.0000 [IU] | Freq: Every day | SUBCUTANEOUS | Status: DC
  Administered 2024-06-13: 20 [IU] via SUBCUTANEOUS
  Filled 2024-06-13: qty 0.2

## 2024-06-13 NOTE — ED Notes (Signed)
 Patient transported to Ultrasound

## 2024-06-13 NOTE — Progress Notes (Addendum)
 PHARMACY - PHYSICIAN COMMUNICATION CRITICAL VALUE ALERT - BLOOD CULTURE IDENTIFICATION (BCID)  Larry Fowler is an 54 y.o. male who presented to [facility] on 06/12/2024 with a chief complaint of fever, weakness  Assessment:  2/4 BCX with GNR> Klebsiella pneumoniae   Name of physician (or Provider) Contacted: [facility]  Current antibiotics: Zosyn    Changes to prescribed antibiotics recommended:  Switch to ceftriaxone  2g IV q24h   Results for orders placed or performed during the hospital encounter of 06/12/24  Blood Culture ID Panel (Reflexed) (Collected: 06/13/2024  4:34 AM)  Result Value Ref Range   Enterococcus faecalis NOT DETECTED NOT DETECTED   Enterococcus Faecium NOT DETECTED NOT DETECTED   Listeria monocytogenes NOT DETECTED NOT DETECTED   Staphylococcus species NOT DETECTED NOT DETECTED   Staphylococcus aureus (BCID) NOT DETECTED NOT DETECTED   Staphylococcus epidermidis NOT DETECTED NOT DETECTED   Staphylococcus lugdunensis NOT DETECTED NOT DETECTED   Streptococcus species NOT DETECTED NOT DETECTED   Streptococcus agalactiae NOT DETECTED NOT DETECTED   Streptococcus pneumoniae NOT DETECTED NOT DETECTED   Streptococcus pyogenes NOT DETECTED NOT DETECTED   A.calcoaceticus-baumannii NOT DETECTED NOT DETECTED   Bacteroides fragilis NOT DETECTED NOT DETECTED   Enterobacterales DETECTED (A) NOT DETECTED   Enterobacter cloacae complex NOT DETECTED NOT DETECTED   Escherichia coli NOT DETECTED NOT DETECTED   Klebsiella aerogenes NOT DETECTED NOT DETECTED   Klebsiella oxytoca NOT DETECTED NOT DETECTED   Klebsiella pneumoniae DETECTED (A) NOT DETECTED   Proteus species NOT DETECTED NOT DETECTED   Salmonella species NOT DETECTED NOT DETECTED   Serratia marcescens NOT DETECTED NOT DETECTED   Haemophilus influenzae NOT DETECTED NOT DETECTED   Neisseria meningitidis NOT DETECTED NOT DETECTED   Pseudomonas aeruginosa NOT DETECTED NOT DETECTED   Stenotrophomonas maltophilia NOT  DETECTED NOT DETECTED   Candida albicans NOT DETECTED NOT DETECTED   Candida auris NOT DETECTED NOT DETECTED   Candida glabrata NOT DETECTED NOT DETECTED   Candida krusei NOT DETECTED NOT DETECTED   Candida parapsilosis NOT DETECTED NOT DETECTED   Candida tropicalis NOT DETECTED NOT DETECTED   Cryptococcus neoformans/gattii NOT DETECTED NOT DETECTED   CTX-M ESBL NOT DETECTED NOT DETECTED   Carbapenem resistance IMP NOT DETECTED NOT DETECTED   Carbapenem resistance KPC NOT DETECTED NOT DETECTED   Carbapenem resistance NDM NOT DETECTED NOT DETECTED   Carbapenem resist OXA 48 LIKE NOT DETECTED NOT DETECTED   Carbapenem resistance VIM NOT DETECTED NOT DETECTED    Sharyne Glatter, PharmD, BCCCP Critical Care Clinical Pharmacist 06/13/2024 9:42 PM

## 2024-06-13 NOTE — ED Notes (Signed)
 Patient transported to CT

## 2024-06-13 NOTE — H&P (Signed)
 " History and Physical    Larry Fowler FMW:[id number] DOB: 1970/06/11 DOA: 06/12/2024  PCP: Alvia Corean CROME, FNP   Patient coming from: Home   Chief Complaint: Shortness of breath, fever, weakness  HPI: Larry Fowler is a 54 y.o. male with medical history significant of diabetes type 2, paroxysmal A-fib, BPH, morbid obesity who presented with complaint of being weak, dehydrated, poor oral intake.  He was recently diagnosed with flu on December 26 and he dad developed fever, cough, body aches, sore throat.  Flu swab was positive for that time.  He reported persistent fever.  Also developed new onset of epigastric pain.  He has history of uncontrolled diabetes mellitus, recent A1c of more than 10 but does not take insulin , takes Actos .  Takes Eliquis  for pulmonary embolism. On presentation, he was in mild sinus tachycardia, had mild fever.  Lab work showed significantly elevated LFTs, ALP.  Right upper quadrant ultrasound negative for any obstruction, showed hepatic steatosis.  Chest x-ray did not show any signs of pneumonia.  UA was concerning for UTI.  CT abdomen/pelvis showed  subtle right perinephric edema with a suggestion of segmental hypoperfusion in the right kidney,features suspicious for pyelonephritis. Associated 10 x 8 mm nonobstructing stone lower pole right Kidney. Patient seen and examined at bedside today.  He was lying in bed.  He was on room air.  Does not appear to be in respiratory distress.  Afebrile during my evaluation this morning.  Wife at bedside. No reported chest pain, palpitations, dysuria, nausea, vomiting or diarrhea.   ED Course: Remained in mild sinus tachycardia with a stable blood pressure.  Had mild grade fever of one 101.5.  Lab work showed sodium of 121, glucose of 468, AST, ALT in the range of 3K and 4K respectively.  UA shows a lot of leukocytes.  Lactic acid elevated at 3.6.  Patient admitted for further management of elevated liver  enzymes(etiology unclear at this point), possible sepsis from UTI/pyelonephritis.  GI consulted and following.  Blood cultures, urine culture sent.  Started on IV fluid and broad-spectrum antibiotics  Review of Systems: As per HPI otherwise 10 point review of systems negative.    Past Medical History:  Diagnosis Date   ALLERGIC RHINITIS 09/12/2007   Qualifier: Diagnosis of  By: Norleen MD, Lynwood ORN    ANXIETY 02/16/2007   Qualifier: Diagnosis of  By: Wilhemina KRAFT, Lucy     ASTHMA 12/17/2009   Qualifier: Diagnosis of  By: Norleen MD, Lynwood ORN    Cannabis abuse    currently   Chills with fever    DEPRESSION 09/12/2007   Qualifier: Diagnosis of  By: Norleen MD, Lynwood ORN    History of cocaine use    in his 20's   Hyperglycemia    Rectal fistula 2009   Rectal pain    Renal stone    2008    Past Surgical History:  Procedure Laterality Date   FINGER SURGERY  2008/09?   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 which knee per Dr Jane in his teens   REPAIR QUADRICEPS/HAMSTRING MUSCLES Bilateral 07/21/2018   Procedure: REPAIR BILATERAL QUADRICEPS;  Surgeon: Jerri Kay HERO, MD;  Location: [facility] OR;  Service: Orthopedics;  Laterality: Bilateral;   TONSILLECTOMY       reports that he quit smoking about 13 years ago. His smoking use included cigarettes. He  smoked an average of 0.3 packs per day. He has never used smokeless tobacco. He reports current alcohol use. He reports that he does not currently use drugs after having used the following drugs: Marijuana.  Allergies[1]  Family History  Problem Relation Age of Onset   Diabetes type II Mother    Diabetes Mother    Pneumonia Father    Alcohol abuse Father    Kidney disease Maternal Grandfather      Prior to Admission medications  Medication Sig Start Date End Date Taking? Authorizing Provider  albuterol  (VENTOLIN  HFA) 108 (90 Base) MCG/ACT inhaler Inhale 2 puffs into the lungs  every 6 (six) hours as needed for wheezing or shortness of breath. 05/18/24   Alvia Corean CROME, FNP  apixaban  (ELIQUIS ) 5 MG TABS tablet Take 2 tablets (10 mg total) by mouth 2 (two) times daily for 6 days THEN take 1 tablet (5 mg total) by mouth 2 (two) times daily thereafter. 07/07/22   Norleen Lynwood ORN, MD  benzonatate  (TESSALON ) 200 MG capsule Take 1 capsule (200 mg total) by mouth 3 (three) times daily as needed for cough. 06/11/24   Merlynn Niki FALCON, FNP  clotrimazole  (LOTRIMIN ) 1 % cream Apply to affected area 2 times daily 08/01/23   Bero, Michael M, MD  cyclobenzaprine  (FLEXERIL ) 10 MG tablet Take 1 tablet (10 mg total) by mouth 3 (three) times daily as needed for muscle spasms. 04/05/24   Alvia Corean CROME, FNP  diphenhydrAMINE  HCl (BENADRYL  MAXIMUM STRENGTH EX) Apply 1 application  topically 2 (two) times daily as needed (irritation of perineum).    [provider]  fluconazole  (DIFLUCAN ) 150 MG tablet Take 1 tablet (150 mg total) by mouth once a week for 12 doses. Patient not taking: Reported on 06/11/2024 04/16/24 07/03/24  Alvia Corean CROME, FNP  fluticasone -salmeterol (WIXELA INHUB) 250-50 MCG/ACT AEPB Inhale 1 puff into the lungs in the morning and at bedtime. Patient not taking: Reported on 04/16/2024 03/08/24   Alvia Corean CROME, FNP  HYDROcodone -acetaminophen  (NORCO/VICODIN) 5-325 MG tablet Take 1 tablet by mouth every 6 (six) hours as needed for moderate pain (pain score 4-6) or severe pain (pain score 7-10). 05/14/24   Alvia Corean CROME, FNP  pioglitazone  (ACTOS ) 30 MG tablet Take 1 tablet (30 mg total) by mouth daily. 05/18/24   Alvia Corean CROME, FNP  tamsulosin  (FLOMAX ) 0.4 MG CAPS capsule Take 1 capsule (0.4 mg total) by mouth daily. 05/18/24   Alvia Corean CROME, FNP    Physical Exam: Vitals:   06/12/24 2220 06/13/24 0427 06/13/24 0614 06/13/24 0645  BP: 124/67 (!) 145/76 107/88 (!) 141/70  Pulse: (!) 101 (!) 101 (!) 101   Resp: 20 18 20    Temp:  98.5 F (36.9 C) (!) 101.5 F (38.6 C) 98.5 F (36.9 C)   TempSrc:  Oral Oral   SpO2: 93% 96% 100%     Constitutional: NAD, calm, comfortable, morbidly obese Vitals:   06/12/24 2220 06/13/24 0427 06/13/24 0614 06/13/24 0645  BP: 124/67 (!) 145/76 107/88 (!) 141/70  Pulse: (!) 101 (!) 101 (!) 101   Resp: 20 18 20    Temp: 98.5 F (36.9 C) (!) 101.5 F (38.6 C) 98.5 F (36.9 C)   TempSrc:  Oral Oral   SpO2: 93% 96% 100%    Eyes: PERRL, lids and conjunctivae normal ENMT: Mucous membranes are moist.  Neck: normal, supple, no masses, no thyromegaly Respiratory: clear to auscultation bilaterally, no wheezing, no crackles. Normal respiratory effort. No accessory muscle use.  Cardiovascular: Regular rate and rhythm, no murmurs / rubs / gallops. No extremity edema.  Abdomen: no tenderness, no masses palpated. No hepatosplenomegaly. Bowel sounds positive.  Musculoskeletal: no clubbing / cyanosis. No joint deformity upper and lower extremities.  Skin: no rashes, lesions, ulcers. No induration Neurologic: CN 2-12 grossly intact.  Strength 5/5 in all 4.  Psychiatric: Normal judgment and insight. Alert and oriented x 3. Normal mood.   Foley Catheter:None  Labs on Admission: I have personally reviewed following labs and imaging studies  CBC: Recent Labs  Lab 06/12/24 1455  WBC 7.9  NEUTROABS 6.2  HGB 15.0  HCT 44.8  MCV 92.4  PLT 132*   Basic Metabolic Panel: Recent Labs  Lab 06/12/24 1455  NA 121*  K 4.4  CL 85*  CO2 19*  GLUCOSE 468*  BUN 13  CREATININE 0.65  CALCIUM 9.0   GFR: Estimated Creatinine Clearance: 197.5 mL/min (by C-G formula based on SCr of 0.65 mg/dL). Liver Function Tests: Recent Labs  Lab 06/12/24 1455  AST 3,742*  ALT 4,543*  ALKPHOS 223*  BILITOT 8.0*  PROT 6.2*  ALBUMIN 3.4*   Recent Labs  Lab 06/12/24 1455  LIPASE 33   No results for input(s): AMMONIA in the last 168 hours. Coagulation Profile: Recent Labs  Lab 06/13/24 0509   INR 1.8*   Cardiac Enzymes: No results for input(s): CKTOTAL, CKMB, CKMBINDEX, TROPONINI in the last 168 hours. BNP (last 3 results) No results for input(s): PROBNP in the last 8760 hours. HbA1C: No results for input(s): HGBA1C in the last 72 hours. CBG: No results for input(s): GLUCAP in the last 168 hours. Lipid Profile: No results for input(s): CHOL, HDL, LDLCALC, TRIG, CHOLHDL, LDLDIRECT in the last 72 hours. Thyroid  Function Tests: No results for input(s): TSH, T4TOTAL, FREET4, T3FREE, THYROIDAB in the last 72 hours. Anemia Panel: No results for input(s): VITAMINB12, FOLATE, FERRITIN, TIBC, IRON , RETICCTPCT in the last 72 hours. Urine analysis:    Component Value Date/Time   COLORURINE AMBER (A) 06/12/2024 1423   APPEARANCEUR HAZY (A) 06/12/2024 1423   APPEARANCEUR Clear 04/04/2024 0000   LABSPEC 1.020 06/12/2024 1423   PHURINE 6.0 06/12/2024 1423   GLUCOSEU >=500 (A) 06/12/2024 1423   GLUCOSEU 100 (A) 12/26/2018 1522   HGBUR MODERATE (A) 06/12/2024 1423   BILIRUBINUR NEGATIVE 06/12/2024 1423   BILIRUBINUR Negative 04/04/2024 0000   KETONESUR 20 (A) 06/12/2024 1423   PROTEINUR 30 (A) 06/12/2024 1423   UROBILINOGEN 0.2 03/23/2024 1640   UROBILINOGEN 0.2 12/26/2018 1522   NITRITE NEGATIVE 06/12/2024 1423   LEUKOCYTESUR MODERATE (A) 06/12/2024 1423    Radiological Exams on Admission: CT ABDOMEN PELVIS W CONTRAST Result Date: 06/13/2024 CLINICAL DATA:  Abdominal pain.  Dehydration.  CT. EXAM: CT ABDOMEN AND PELVIS WITH CONTRAST TECHNIQUE: Multidetector CT imaging of the abdomen and pelvis was performed using the standard protocol following bolus administration of intravenous contrast. RADIATION DOSE REDUCTION: This exam was performed according to the departmental dose-optimization program which includes automated exposure control, adjustment of the mA and/or kV according to patient size and/or use of iterative reconstruction  technique. CONTRAST:  OMNIPAQUE  IOHEXOL  350 MG/ML SOLN COMPARISON:  04/05/2024 FINDINGS: Lower chest: No acute findings. Hepatobiliary: No suspicious focal abnormality within the liver parenchyma. Gallbladder is decompressed. No intrahepatic or extrahepatic biliary dilation. Pancreas: No focal mass lesion. No dilatation of the main duct. No intraparenchymal cyst. No peripancreatic edema. Spleen: No splenomegaly. No suspicious focal mass lesion. Adrenals/Urinary Tract: No adrenal nodule or mass. 10 x  8 mm nonobstructing stone identified lower pole right kidney. There is subtle right perinephric edema and a suggestion of segmental hypoperfusion in the right kidney (see posterolateral interpolar right kidney on 41/2 and lower pole right kidney on 51/2). Left kidney unremarkable. No evidence for hydroureter. The urinary bladder appears normal for the degree of distention. Stomach/Bowel: Tiny hiatal hernia. Stomach is decompressed. Duodenum is normally positioned as is the ligament of Treitz. No small bowel wall thickening. No small bowel dilatation. The terminal ileum is normal. The appendix is normal. No gross colonic mass. No colonic wall thickening. Vascular/Lymphatic: No abdominal aortic aneurysm. No abdominal aortic atherosclerotic calcification. There is no gastrohepatic or hepatoduodenal ligament lymphadenopathy. No retroperitoneal or mesenteric lymphadenopathy. No pelvic sidewall lymphadenopathy. Reproductive: The prostate gland and seminal vesicles are unremarkable. Other: No substantial intraperitoneal free fluid. Musculoskeletal: No worrisome lytic or sclerotic osseous abnormality. IMPRESSION: 1. Subtle right perinephric edema with a suggestion of segmental hypoperfusion in the right kidney. Imaging features suspicious for pyelonephritis. 2. Associated 10 x 8 mm nonobstructing stone lower pole right kidney. 3. Tiny hiatal hernia. Electronically Signed   By: Camellia Candle M.D.   On: 06/13/2024 07:01    US  Abdomen Limited RUQ (LIVER/GB) Result Date: 06/13/2024 CLINICAL DATA:  Abdominal pain. EXAM: ULTRASOUND ABDOMEN LIMITED RIGHT UPPER QUADRANT COMPARISON:  CT scan 04/05/2024 FINDINGS: Gallbladder: Gallbladder is incompletely distended which can decrease sensitivity for the detection of gallstones. Within this limitation, no gallstones evident. Gallbladder wall thickness is upper normal at 3.1 mm and likely accentuated by underdistention. Sonographer reports no sonographic Murphy sign. Common bile duct: Diameter: 3 mm Liver: Liver is diffusely echogenic suggesting underlying fatty deposition. Portal vein is patent on color Doppler imaging with normal direction of blood flow towards the liver. Other: None. IMPRESSION: 1. Gallbladder is incompletely distended which can decrease sensitivity for the detection of gallstones. Within this limitation, no gallstones evident. Gallbladder wall thickness is upper normal at 3.1 mm and likely accentuated by underdistention. No sonographic Murphy sign. 2. Hepatic steatosis. Electronically Signed   By: Camellia Candle M.D.   On: 06/13/2024 05:51   DG Chest 1 View Result Date: 06/12/2024 CLINICAL DATA:  Cough. EXAM: CHEST  1 VIEW COMPARISON:  06/26/2022 FINDINGS: Both lungs are clear. No focal airspace disease or pulmonary edema. Heart size is normal. The trachea is midline. Negative for a pneumothorax. No acute bone abnormality. IMPRESSION: No active disease. Electronically Signed   By: Juliene Balder M.D.   On: 06/12/2024 15:54     Assessment/Plan Principal Problem:   Sepsis (HCC) Active Problems:   Morbid obesity (HCC)   Renal stone   OSA (obstructive sleep apnea)   Diabetes (HCC)   Uncontrolled type 2 diabetes mellitus with hyperglycemia (HCC)   Saddle pulmonary embolus (HCC)   Elevated LFTs   Severe sepsis secondary to UTI/pyelonephritis: Presented with generalized weakness, mild -grade fever, sinus tachycardia.  Lab work showed elevated lactate.  UA  suspicious for UTI.  CT abdomen/pelvis shows possible right-sided pyelonephritis with nonobstructing stone.  Continue Zosyn  for now.  Follow-up cultures Continue IV fluid  Elevated liver enzymes: Unclear etiology.  GI following.  Autoimmune panel pending.  Pending CMV/EBV.  Hepatitis panel negative.  Right upper quadrant ultrasound does not show any signs of cholecystitis or obstruction.  Tylenol  level less than 10  Uncontrolled diabetes type 2: A1c of 13.3.  Only takes pioglitazone  at home.  Diabetic coordinator consulted.  Continue current insulin  regimen.  Monitor blood sugars.  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  Currently on room air.  Hyponatremia: Sodium in the range of 120s on presentation this is most likely from pseudohyponatremia from hyperglycemia.  Morbid obesity: BMI 56  OSA: Does not use CPAP         Severity of Illness: The appropriate patient status for this patient is INPATIENT. Inpatient status is judged to be reasonable and necessary in order to provide the required intensity of service to ensure the patient's safety. The patient's presenting symptoms, physical exam findings, and initial radiographic and laboratory data in the context of their chronic comorbidities is felt to place them at high risk for further clinical deterioration. Furthermore, it is not anticipated that the patient will be medically stable for discharge from the hospital within 2 midnights of admission.   * I certify that at the point of admission it is my clinical judgment that the patient will require inpatient hospital care spanning beyond 2 midnights from the point of admission due to high intensity of service, high risk for further deterioration and high frequency of surveillance required.*   DVT prophylaxis: Eliquis  Code Status: Full Family Communication: wife at bedside Consults  called: GI     Ivonne Mustache MD [facility]  06/13/2024, 7:43 AM       [1]  Allergies Allergen Reactions   Metformin  And Related Diarrhea and Nausea Only   "

## 2024-06-13 NOTE — Consult Note (Addendum)
 "                                               Consultation Note   Referring Provider:   [facility] PCP: Alvia Corean CROME, FNP Primary Gastroenterologist:    Lupita Commander, MD    Reason for Consultation: Abdominal pain, fever, elevated liver chemistries DOA: 06/12/2024         Hospital Day: 2   Assessment and Plan:  54 yo male with flu like symptoms, reported recent Influenza A now admitted with severe sepsis 2/2 to UTI / pyelonephritis and also makedly elevated liver chemistries in mixed pattern.  Normal mentation. INR elevation in setting of Eliquis . Suspect viral etiology vr DILI. Ischemic injury unlikely or a thromboembolic event involving liver, especially since anticoagulated. No evidence for biliary disease. CT without acute findings. RUQ limited - gallbladder not completely distended but no obvious stones. Regardless, clinically doesn't fit gallbladder disease.  Trend LFTs Am INR Await autoimmune markers Will add labs for CMV, EBV Limit Tylenol  to 2 grams / daily for fever Avoid hepatotoxic medications    History of PE On Eliquis   Hyponatremia Hyperglycemia Type 2 diabetes  Morbid obesity  See PMH for any additional medical history  / medical problems  Principal Problem:   Sepsis (HCC) Active Problems:   Morbid obesity (HCC)   Renal stone   OSA (obstructive sleep apnea)   Diabetes (HCC)   Uncontrolled type 2 diabetes mellitus with hyperglycemia (HCC)   Saddle pulmonary embolus (HCC)   Elevated LFTs   HPI   Brief History:  Patient is a 54 year old male known to Dr. Commander, not seen since 2020 at which time he was seen for anemia.  Patient brought to ED by his wife yesterday for flu like symptoms.  Tested positive for influenza several days ago. Still having fevers, cough, weakness. Tested positive for Flu several days ago. Over the weekend he had nausea, vomiting, diarrhea ( now resolved) .  A few days ago he began having epigastric pain mainly  associated with eating/drinking. Saw PCP who recommended famotidine. Due to feeling poorly and epigastric discomfort with PO Intake patient hasn't been eating / drinking so wife brought him to ED for evaluation. Workup revealed . Now admitted with severe sepsis 2/2 to UTI / pyelonephritis, elevated LFTs.   Admission studies / evaluation notable for:  normal WBC, hemoglobin 15 , platelets 132 , sodium of 121, glucose 468, anion gap 18, alk phos 223, AST 3742, ALT 4543, T. bili 8 lipase 33, lactic acid 2.9  RUQ ultrasound  Gallbladder is incompletely distended which can decrease sensitivity for the detection of gallstones. Within this limitation,no gallstones evident. Gallbladder wall thickness is upper normal at 3.1 mm and likely accentuated by underdistention. No sonographic Murphy sign.  CTAP with contrast -possibly pyelonephritis.  No acute findings.  No biliary duct dilation.  Liver unremarkable    Since Admission:   Tmax 101.5 . AST down to 1249, ALT 2842, bili up to 9.7.  Alk phos about the same at 214.    Investigation of abnormal liver chemistries:  Patient taking no more than 2 tylenol  a day and takes only one if takes a hydrocodone .  Took course of Augmentin  (urinary infection?) but was a month ago.  Took two days of Doxycycline  around New Years.  Recently started Actos . Other than that, no  new medications.  Diflucan  on home med list but hasn't taken in at least a month. Rare etoh use No know [facility] of liver disease Acute hepatitis panel neg Acetaminophen  level negative Autoimmune labs pending Normal appearing liver on imaging. No biliary duct dilation   Recent Labs    06/12/24 1455 06/13/24 0800  PROT 6.2* 5.7*  ALBUMIN 3.4* 3.1*  AST 3,742* 1,249*  ALT 4,543* 2,842*  ALKPHOS 223* 214*  BILITOT 8.0* 9.7*   Recent Labs    06/12/24 1455  WBC 7.9  HGB 15.0  HCT 44.8  MCV 92.4  PLT 132*   Recent Labs    06/12/24 1455 06/13/24 0800  NA 121* 122*  K 4.4 4.4   CL 85* 87*  CO2 19* 21*  GLUCOSE 468* 447*  BUN 13 16  CREATININE 0.65 0.55*  CALCIUM 9.0 8.5*     CT ABDOMEN PELVIS W CONTRAST CLINICAL DATA:  Abdominal pain.  Dehydration.  CT.  EXAM: CT ABDOMEN AND PELVIS WITH CONTRAST  TECHNIQUE: Multidetector CT imaging of the abdomen and pelvis was performed using the standard protocol following bolus administration of intravenous contrast.  RADIATION DOSE REDUCTION: This exam was performed according to the departmental dose-optimization program which includes automated exposure control, adjustment of the mA and/or kV according to patient size and/or use of iterative reconstruction technique.  CONTRAST:  OMNIPAQUE  IOHEXOL  350 MG/ML SOLN  COMPARISON:  04/05/2024  FINDINGS: Lower chest: No acute findings.  Hepatobiliary: No suspicious focal abnormality within the liver parenchyma. Gallbladder is decompressed. No intrahepatic or extrahepatic biliary dilation.  Pancreas: No focal mass lesion. No dilatation of the main duct. No intraparenchymal cyst. No peripancreatic edema.  Spleen: No splenomegaly. No suspicious focal mass lesion.  Adrenals/Urinary Tract: No adrenal nodule or mass. 10 x 8 mm nonobstructing stone identified lower pole right kidney. There is subtle right perinephric edema and a suggestion of segmental hypoperfusion in the right kidney (see posterolateral interpolar right kidney on 41/2 and lower pole right kidney on 51/2). Left kidney unremarkable. No evidence for hydroureter. The urinary bladder appears normal for the degree of distention.  Stomach/Bowel: Tiny hiatal hernia. Stomach is decompressed. Duodenum is normally positioned as is the ligament of Treitz. No small bowel wall thickening. No small bowel dilatation. The terminal ileum is normal. The appendix is normal. No gross colonic mass. No colonic wall thickening.  Vascular/Lymphatic: No abdominal aortic aneurysm. No abdominal aortic  atherosclerotic calcification. There is no gastrohepatic or hepatoduodenal ligament lymphadenopathy. No retroperitoneal or mesenteric lymphadenopathy. No pelvic sidewall lymphadenopathy.  Reproductive: The prostate gland and seminal vesicles are unremarkable.  Other: No substantial intraperitoneal free fluid.  Musculoskeletal: No worrisome lytic or sclerotic osseous abnormality.  IMPRESSION: 1. Subtle right perinephric edema with a suggestion of segmental hypoperfusion in the right kidney. Imaging features suspicious for pyelonephritis. 2. Associated 10 x 8 mm nonobstructing stone lower pole right kidney. 3. Tiny hiatal hernia.  Electronically Signed   By: Camellia Candle M.D.   On: 06/13/2024 07:01 US  Abdomen Limited RUQ (LIVER/GB) CLINICAL DATA:  Abdominal pain.  EXAM: ULTRASOUND ABDOMEN LIMITED RIGHT UPPER QUADRANT  COMPARISON:  CT scan 04/05/2024  FINDINGS: Gallbladder:  Gallbladder is incompletely distended which can decrease sensitivity for the detection of gallstones. Within this limitation, no gallstones evident. Gallbladder wall thickness is upper normal at 3.1 mm and likely accentuated by underdistention. Sonographer reports no sonographic Murphy sign.  Common bile duct:  Diameter: 3 mm  Liver:  Liver is diffusely echogenic suggesting underlying fatty deposition.  Portal vein is patent on color Doppler imaging with normal direction of blood flow towards the liver.  Other: None.  IMPRESSION: 1. Gallbladder is incompletely distended which can decrease sensitivity for the detection of gallstones. Within this limitation, no gallstones evident. Gallbladder wall thickness is upper normal at 3.1 mm and likely accentuated by underdistention. No sonographic Murphy sign. 2. Hepatic steatosis.  Electronically Signed   By: Camellia Candle M.D.   On: 06/13/2024 05:51    Prior Endoscopic Evaluations:    none  Review of Systems: All systems reviewed and  negative except where noted in HPI.  Physical Exam: Vital signs in last 24 hours: Temp:  [97.9 F (36.6 C)-101.5 F (38.6 C)] 99.4 F (37.4 C) (01/07 1053) Pulse Rate:  [95-101] 96 (01/07 1053) Resp:  [18-22] 22 (01/07 1053) BP: (107-145)/(62-88) 110/66 (01/07 1053) SpO2:  [90 %-100 %] 100 % (01/07 1053)   General:  Pleasant male in NAD Psych:  Cooperative. Normal mood and affect Eyes: Pupils equal Ears:  Normal auditory acuity Nose: No deformity, discharge or lesions Neck:  Supple, no masses felt Lungs:  Clear to auscultation.  Heart:  Regular rate, regular rhythm.  Abdomen:  Soft, nondistended, mild RUQ tenderness,  active bowel sounds, no masses felt Rectal :  Deferred Msk: Symmetrical without gross deformities.  Neurologic:  Alert, oriented, grossly normal neurologically Extremities : No edema Skin:  Intact without significant lesions.   OUTPATIENT MEDICATIONS Prior to Admission medications  Medication Sig Start Date End Date Taking? Authorizing Provider  albuterol  (VENTOLIN  HFA) 108 (90 Base) MCG/ACT inhaler Inhale 2 puffs into the lungs every 6 (six) hours as needed for wheezing or shortness of breath. 05/18/24  Yes Alvia Corean CROME, FNP  apixaban  (ELIQUIS ) 5 MG TABS tablet Take 2 tablets (10 mg total) by mouth 2 (two) times daily for 6 days THEN take 1 tablet (5 mg total) by mouth 2 (two) times daily thereafter. 07/07/22  Yes Norleen Lynwood ORN, MD  benzonatate  (TESSALON ) 200 MG capsule Take 1 capsule (200 mg total) by mouth 3 (three) times daily as needed for cough. 06/11/24  Yes Merlynn Eland F, FNP  clotrimazole  (LOTRIMIN ) 1 % cream Apply to affected area 2 times daily Patient taking differently: Apply 1 Application topically 2 (two) times daily as needed. Apply to affected area 2 times daily 08/01/23  Yes Bero, Ozell HERO, MD  cyclobenzaprine  (FLEXERIL ) 10 MG tablet Take 1 tablet (10 mg total) by mouth 3 (three) times daily as needed for muscle spasms. 04/05/24  Yes  Alvia Corean CROME, FNP  diphenhydrAMINE  HCl (BENADRYL  MAXIMUM STRENGTH EX) Apply 1 application  topically 2 (two) times daily as needed (irritation of perineum).   Yes [provider]  HYDROcodone -acetaminophen  (NORCO/VICODIN) 5-325 MG tablet Take 1 tablet by mouth every 6 (six) hours as needed for moderate pain (pain score 4-6) or severe pain (pain score 7-10). 05/14/24  Yes Alvia Corean CROME, FNP  pioglitazone  (ACTOS ) 30 MG tablet Take 1 tablet (30 mg total) by mouth daily. 05/18/24  Yes Alvia Corean CROME, FNP  tamsulosin  (FLOMAX ) 0.4 MG CAPS capsule Take 1 capsule (0.4 mg total) by mouth daily. 05/18/24  Yes Alvia Corean CROME, FNP  fluconazole  (DIFLUCAN ) 150 MG tablet Take 1 tablet (150 mg total) by mouth once a week for 12 doses. Patient not taking: No sig reported 04/16/24 07/03/24  Alvia Corean CROME, FNP  fluticasone -salmeterol (WIXELA INHUB) 250-50 MCG/ACT AEPB Inhale 1 puff into the lungs in the morning and at bedtime. Patient not  taking: No sig reported 03/08/24   Alvia Corean CROME, FNP    Allergies as of 06/12/2024 - Review Complete 06/12/2024  Allergen Reaction Noted   Metformin  and related Diarrhea and Nausea Only 12/28/2018    INPATIENT MEDICATIONS Current Facility-Administered Medications  Medication Dose Route Frequency Provider Last Rate Last Admin   0.9 %  sodium chloride  infusion   Intravenous Continuous Jillian Buttery, MD 100 mL/hr at 06/13/24 0752 New Bag at 06/13/24 0752   apixaban  (ELIQUIS ) tablet 5 mg  5 mg Oral BID Jillian Buttery, MD   5 mg at 06/13/24 1051   chlorpheniramine-HYDROcodone  (TUSSIONEX) 10-8 MG/5ML suspension 5 mL  5 mL Oral Q12H Adhikari, Amrit, MD       insulin  aspart (novoLOG ) injection 0-20 Units  0-20 Units Subcutaneous TID WC Adhikari, Amrit, MD   20 Units at 06/13/24 0809   insulin  aspart (novoLOG ) injection 0-5 Units  0-5 Units Subcutaneous QHS Adhikari, Amrit, MD       insulin  glargine (LANTUS ) injection 20 Units   20 Units Subcutaneous Daily Adhikari, Amrit, MD   20 Units at 06/13/24 1028   ondansetron  (ZOFRAN ) injection 4 mg  4 mg Intravenous Q6H PRN Jillian Buttery, MD       piperacillin -tazobactam (ZOSYN ) IVPB 3.375 g  3.375 g Intravenous Q8H Opyd, Timothy S, MD 12.5 mL/hr at 06/13/24 0810 3.375 g at 06/13/24 9189   tamsulosin  (FLOMAX ) capsule 0.4 mg  0.4 mg Oral Daily Jillian Buttery, MD   0.4 mg at 06/13/24 1028   Current Outpatient Medications  Medication Sig Dispense Refill   albuterol  (VENTOLIN  HFA) 108 (90 Base) MCG/ACT inhaler Inhale 2 puffs into the lungs every 6 (six) hours as needed for wheezing or shortness of breath. 18 g 3   apixaban  (ELIQUIS ) 5 MG TABS tablet Take 2 tablets (10 mg total) by mouth 2 (two) times daily for 6 days THEN take 1 tablet (5 mg total) by mouth 2 (two) times daily thereafter. 180 tablet 3   benzonatate  (TESSALON ) 200 MG capsule Take 1 capsule (200 mg total) by mouth 3 (three) times daily as needed for cough. 30 capsule 0   clotrimazole  (LOTRIMIN ) 1 % cream Apply to affected area 2 times daily (Patient taking differently: Apply 1 Application topically 2 (two) times daily as needed. Apply to affected area 2 times daily) 45 g 0   cyclobenzaprine  (FLEXERIL ) 10 MG tablet Take 1 tablet (10 mg total) by mouth 3 (three) times daily as needed for muscle spasms. 30 tablet 0   diphenhydrAMINE  HCl (BENADRYL  MAXIMUM STRENGTH EX) Apply 1 application  topically 2 (two) times daily as needed (irritation of perineum).     HYDROcodone -acetaminophen  (NORCO/VICODIN) 5-325 MG tablet Take 1 tablet by mouth every 6 (six) hours as needed for moderate pain (pain score 4-6) or severe pain (pain score 7-10). 20 tablet 0   pioglitazone  (ACTOS ) 30 MG tablet Take 1 tablet (30 mg total) by mouth daily. 90 tablet 1   tamsulosin  (FLOMAX ) 0.4 MG CAPS capsule Take 1 capsule (0.4 mg total) by mouth daily. 30 capsule 1   fluconazole  (DIFLUCAN ) 150 MG tablet Take 1 tablet (150 mg total) by mouth once a week  for 12 doses. (Patient not taking: No sig reported) 12 tablet 0   fluticasone -salmeterol (WIXELA INHUB) 250-50 MCG/ACT AEPB Inhale 1 puff into the lungs in the morning and at bedtime. (Patient not taking: No sig reported) 60 each 6     Past Medical History:  Diagnosis Date   ALLERGIC RHINITIS 09/12/2007  Qualifier: Diagnosis of  By: Norleen MD, Lynwood ORN    ANXIETY 02/16/2007   Qualifier: Diagnosis of  By: Wilhemina SHARALYN Aspen     ASTHMA 12/17/2009   Qualifier: Diagnosis of  By: Norleen MD, Lynwood ORN    Cannabis abuse    currently   Chills with fever    DEPRESSION 09/12/2007   Qualifier: Diagnosis of  By: Norleen MD, Lynwood ORN    History of cocaine use    in his 20's   Hyperglycemia    Rectal fistula 2009   Rectal pain    Renal stone    2008    Past Surgical History:  Procedure Laterality Date   FINGER SURGERY  2008/09?   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 which knee per Dr Jane in his teens   REPAIR QUADRICEPS/HAMSTRING MUSCLES Bilateral 07/21/2018   Procedure: REPAIR BILATERAL QUADRICEPS;  Surgeon: Jerri Kay HERO, MD;  Location: [facility] OR;  Service: Orthopedics;  Laterality: Bilateral;   TONSILLECTOMY      Family History  Problem Relation Age of Onset   Diabetes type II Mother    Diabetes Mother    Pneumonia Father    Alcohol abuse Father    Kidney disease Maternal Grandfather     Social History   Socioeconomic History   Marital status: Married    Spouse name: Not on file   Number of children: Not on file   Years of education: Not on file   Highest education level: 1st grade  Occupational History    Employer: [employer]  Tobacco Use   Smoking status: Former    Current packs/day: 0.00    Average packs/day: 0.3 packs/day    Types: Cigarettes    Quit date: 03/01/2011    Years since quitting: 13.2   Smokeless tobacco: Never   Tobacco comments:    doesn't smoke the whole cig, burns out while playing  video game  Vaping Use   Vaping status: Never Used  Substance and Sexual Activity   Alcohol use: Yes   Drug use: Not Currently    Types: Marijuana   Sexual activity: Not on file  Other Topics Concern   Not on file  Social History Narrative   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  - [employer]   Marijuana user, former smoker, some EtOH   Social Drivers of Health   Tobacco Use: Medium Risk (06/12/2024)   Patient History    Smoking Tobacco Use: Former    Smokeless Tobacco Use: Never    Passive Exposure: Not on file  Financial Resource Strain: Patient Declined (11/01/2023)   Overall Financial Resource Strain (CARDIA)    Difficulty of Paying Living Expenses: Patient declined  Food Insecurity: Patient Declined (11/01/2023)   Hunger Vital Sign    Worried About Running Out of Food in the Last Year: Patient declined    Ran Out of Food in the Last Year: Patient declined  Transportation Needs: Patient Declined (11/01/2023)   PRAPARE - Administrator, Civil Service (Medical): Patient declined    Lack of Transportation (Non-Medical): Patient declined  Physical Activity: Insufficiently Active (11/01/2023)   Exercise Vital Sign    Days of Exercise per Week: 3 days    Minutes of Exercise per Session: 30 min  Stress: Patient Declined (11/01/2023)   [employer] of Occupational Health -  Occupational Stress Questionnaire    Feeling of Stress : Patient declined  Social Connections: Unknown (11/01/2023)   Social Connection and Isolation Panel    Frequency of Communication with Friends and Family: Patient declined    Frequency of Social Gatherings with Friends and Family: Patient declined    Attends Religious Services: Never    Database Administrator or Organizations: No    Attends Engineer, Structural: Not on file    Marital Status: Married  Catering Manager Violence: Not on file  Depression (PHQ2-9): Low Risk (06/11/2024)   Depression (PHQ2-9)     PHQ-2 Score: 1  Alcohol Screen: Not on file  Housing: High Risk (11/01/2023)   Housing Stability Vital Sign    Unable to Pay for Housing in the Last Year: Yes    Number of Times Moved in the Last Year: 0    Homeless in the Last Year: No  Utilities: Not on file  Health Literacy: Not on file    Code Status   Code Status: Prior   Vina Dasen, NP-C   06/13/2024, 11:10 AM  -----------------------------------------------------------------------------------------  I have taken a history, reviewed the chart and examined the patient. I performed a substantive portion of this encounter, including complete performance of at least one of the key components, in conjunction with the APP. I agree with the APP's note, impression and recommendations  54 year old male with history of morbid obesity, diabetes and history of pulmonary embolism on Eliquis  who is being admitted with persistent viral infectious symptoms, suspected pyelonephritis and markedly elevated liver enzymes. He tells me he tested positive for the flu shortly after Christmas, but has continued to have worsening symptoms to include severe fatigue, malaise as well as worsening cough. He was found to have markedly elevated liver enzymes with ALT 4000, T. bili 8.  Previous liver enzymes less than a month ago were normal.  Repeat liver enzymes this morning showed significant improvement but continued marked elevation of his aminotransferases and slight worsening of his hyperbilirubinemia.  He has evidence of fatty liver on a prior CT, but imaging this admission shows an unknown remarkable liver and no ductal dilation.  CT also did show a nonobstructing stone in the right kidney and some perinephric edema suspicious for pyelonephritis.  His INR is elevated, but he takes Eliquis .  He has not had any mental status changes per the wife.  He has taken Augmentin  in the past month and doxycycline  a week ago.  He has been taking some B vitamin  supplements and another supplement containing a mixture of cinnamon, licorice, ginger. He has no family history of liver disease or autoimmune disease. No history of heavy alcohol use.  He has been taking Tylenol , but not exceeding the recommended dosage.  Marked liver enzyme elevation is most likely secondary to drug-induced liver injury versus viral infection.  Low suspicion for shock liver given no clinical history of documented hypotension or syncope.  Ultrasound and CT do not demonstrate presence of clot.  Autoimmune hepatitis possible, but less likely.  Awaiting serology results.  Adding testing for CMV and EBV infection. No suspicion for acetaminophen  toxicity based on patient's reported intake and reliable historian. Coagulopathy initially concerning, but can be easily explained by the chronic Eliquis  use.  Very unlikely coagulopathy is secondary to hepatic dysfunction.  Do not feel that NAC therapy is warranted at this time.  For now, recommend trending liver enzymes.  Expect that aminotransferases will downtrend while bilirubin will continue to increase as  the injury resolves. Continue to monitor mental status and INR.  If INR is increasing and/or mental status worsening, would initiate NAC at that time.   Chaniah Cisse E. Stacia, MD [facility]  High complex medical decision making (this includes chart review, review of results, face-to-face time used for counseling as well as treatment plan and follow-up. The patient was provided an opportunity to ask questions and all were answered. The patient agreed with the plan and demonstrated an understanding of the instructions    "

## 2024-06-13 NOTE — Inpatient Diabetes Management (Signed)
 Inpatient Diabetes Program Recommendations  AACE/ADA: New Consensus Statement on Inpatient Glycemic Control (2015)  Target Ranges:  Prepandial:   less than 140 mg/dL      Peak postprandial:   less than 180 mg/dL (1-2 hours)      Critically ill patients:  140 - 180 mg/dL   Lab Results  Component Value Date   GLUCAP 313 (H) 06/13/2024   HGBA1C 13.3 (H) 06/13/2024    Review of Glycemic Control  Diabetes history: DM 2 Outpatient Diabetes medications: Actos  30 mg Daily Current orders for Inpatient glycemic control:  Lantus  20 units Novolog  0-20 units tid + hs  A1c 13.3% on 1/7  7.2% on 12/06/2023  Inpatient Diabetes Program Recommendations:    -   Consider BID dosing of basal insulin  (30 units bid 0.3 units/kg)  Spoke with patient and wife at bedside regarding Diabetes and glucose control at home. Pt reports being on a supplement consisting of apple cider vinegar and cinnamon among other ingredients the first part of 2025 and was able to get his A1c from 9% down to 7.2% in July. Pt reports not knowing what happened as there was nothing different in his lifestyle to initiate the A1c to increase to his knowledge. When his A1c increased to 13.7% on 05/17/2024 his PCP placed him on Actos  30 mg Daily. Pt reports seeing his PCP every 1-2 months. Discussed current A1c. Reviewed glucose and A1c goals.  Discussed importance of checking CBGs and maintaining good CBG control to prevent long-term and short-term complications. Explained how hyperglycemia leads to damage within blood vessels which lead to the common complications seen with uncontrolled diabetes. Stressed to the patient the importance of improving glycemic control to prevent further complications from uncontrolled diabetes. Discussed impact of nutrition, exercise, stress, sickness, and medications on diabetes control.  Discussed carbohydrates, carbohydrate goals per day and meal, along with portion sizes. Encouraged patient to check glucose  4 times per day (before meals and at bedtime) and to keep a log book of glucose readings and insulin  taken which will need to be taken to doctor appointments. Explained how the doctor can use the log book to continue to make insulin  adjustments if needed.   Educated patient and spouse on insulin  pen use at home. Reviewed all steps of insulin  pen including attachment of needle, 2-unit air shot, dialing up dose, giving injection, removing needle, disposal of sharps, storage of unused insulin , disposal of insulin  etc. Patient able to provide successful return demonstration. Also reviewed troubleshooting with insulin  pen. MD to give patient Rxs for insulin  pens and insulin  pen needles. Pt and wife had questions on GLP injectables.  Note: pt does not show any current insurance and will need medication assistance preferably longer term than what the match can cover.   Patient verbalized understanding of information discussed and he states that he has no further questions at this time related to diabetes.  Match vs. [employer] at d/c need SW and CM consult.  Thanks,  Clotilda Bull RN, MSN, BC-ADM Inpatient Diabetes Coordinator Team Pager [phone] (8a-5p)

## 2024-06-13 NOTE — ED Notes (Signed)
 MD Adikhari notified of CBG above 400 as per sliding scale order. Per MD, give highest sliding scale unit amount.

## 2024-06-13 NOTE — ED Provider Notes (Signed)
 " [place] [facility] AT [facility] Provider Note   CSN: [id number] Arrival date & time: 06/12/24  1252     Patient presents with: Influenza   Larry Fowler is a 54 y.o. male.   The history is provided by the patient.  Influenza Larry Fowler is a 54 y.o. male who presents to the Emergency Department complaining of concern for dehydration.  He presents to the emergency department because he is concerned he is dehydrated.  He states he came down with the flu on 26 December with fever, cough, body aches and sore throat.  He did have a positive flu swab at that time.  He had about 5 days of fever.  He states that he has persistent coughing and over the last 3 days he has developed epigastric pain anytime he tries to drink something and very dark urine.  He is not having any vomiting or diarrhea.  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  He does not smoke, use alcohol or drugs.  No prior similar symptoms.  He did take Tylenol  for influenza but not take any more than the recommended amount on the label.     Prior to Admission medications  Medication Sig Start Date End Date Taking? Authorizing Provider  albuterol  (VENTOLIN  HFA) 108 (90 Base) MCG/ACT inhaler Inhale 2 puffs into the lungs every 6 (six) hours as needed for wheezing or shortness of breath. 05/18/24   Alvia Corean CROME, FNP  apixaban  (ELIQUIS ) 5 MG TABS tablet Take 2 tablets (10 mg total) by mouth 2 (two) times daily for 6 days THEN take 1 tablet (5 mg total) by mouth 2 (two) times daily thereafter. 07/07/22   Norleen Lynwood ORN, MD  benzonatate  (TESSALON ) 200 MG capsule Take 1 capsule (200 mg total) by mouth 3 (three) times daily as needed for cough. 06/11/24   Merlynn Niki FALCON, FNP  clotrimazole  (LOTRIMIN ) 1 % cream Apply to affected area 2 times daily 08/01/23   Bero, Michael M, MD  cyclobenzaprine  (FLEXERIL ) 10 MG tablet Take 1 tablet (10 mg total) by mouth 3 (three) times daily as  needed for muscle spasms. 04/05/24   Alvia Corean CROME, FNP  diphenhydrAMINE  HCl (BENADRYL  MAXIMUM STRENGTH EX) Apply 1 application  topically 2 (two) times daily as needed (irritation of perineum).    [provider]  fluconazole  (DIFLUCAN ) 150 MG tablet Take 1 tablet (150 mg total) by mouth once a week for 12 doses. Patient not taking: Reported on 06/11/2024 04/16/24 07/03/24  Alvia Corean CROME, FNP  fluticasone -salmeterol (WIXELA INHUB) 250-50 MCG/ACT AEPB Inhale 1 puff into the lungs in the morning and at bedtime. Patient not taking: Reported on 04/16/2024 03/08/24   Alvia Corean CROME, FNP  HYDROcodone -acetaminophen  (NORCO/VICODIN) 5-325 MG tablet Take 1 tablet by mouth every 6 (six) hours as needed for moderate pain (pain score 4-6) or severe pain (pain score 7-10). 05/14/24   Alvia Corean CROME, FNP  pioglitazone  (ACTOS ) 30 MG tablet Take 1 tablet (30 mg total) by mouth daily. 05/18/24   Alvia Corean CROME, FNP  tamsulosin  (FLOMAX ) 0.4 MG CAPS capsule Take 1 capsule (0.4 mg total) by mouth daily. 05/18/24   Alvia Corean CROME, FNP    Allergies: Metformin  and related    Review of Systems  All other systems reviewed and are negative.   Updated Vital Signs BP (!) 141/70   Pulse (!) 101   Temp 98.5 F (36.9 C) (Oral)   Resp  20   SpO2 100%   Physical Exam Vitals and nursing note reviewed.  Constitutional:      Appearance: He is well-developed.  HENT:     Head: Normocephalic and atraumatic.  Cardiovascular:     Rate and Rhythm: Regular rhythm. Tachycardia present.     Heart sounds: No murmur heard. Pulmonary:     Effort: Pulmonary effort is normal. No respiratory distress.     Comments: Distant breath sounds Abdominal:     Palpations: Abdomen is soft.     Tenderness: There is no guarding or rebound.     Comments: Mild epigastric and right sided abdominal tenderness  Musculoskeletal:        General: No swelling or tenderness.  Skin:    General: Skin  is warm and dry.  Neurological:     Mental Status: He is alert and oriented to person, place, and time.  Psychiatric:        Behavior: Behavior normal.     (all labs ordered are listed, but only abnormal results are displayed) Labs Reviewed  CBC WITH DIFFERENTIAL/PLATELET - Abnormal; Notable for the following components:      Result Value   Platelets 132 (*)    All other components within normal limits  COMPREHENSIVE METABOLIC PANEL WITH GFR - Abnormal; Notable for the following components:   Sodium 121 (*)    Chloride 85 (*)    CO2 19 (*)    Glucose, Bld 468 (*)    Total Protein 6.2 (*)    Albumin 3.4 (*)    AST 3,742 (*)    ALT 4,543 (*)    Alkaline Phosphatase 223 (*)    Total Bilirubin 8.0 (*)    Anion gap 18 (*)    All other components within normal limits  URINALYSIS, ROUTINE W REFLEX MICROSCOPIC - Abnormal; Notable for the following components:   Color, Urine AMBER (*)    APPearance HAZY (*)    Glucose, UA >=500 (*)    Hgb urine dipstick MODERATE (*)    Ketones, ur 20 (*)    Protein, ur 30 (*)    Leukocytes,Ua MODERATE (*)    Bacteria, UA MANY (*)    All other components within normal limits  PROTIME-INR - Abnormal; Notable for the following components:   Prothrombin Time 21.5 (*)    INR 1.8 (*)    All other components within normal limits  I-STAT CG4 LACTIC ACID, ED - Abnormal; Notable for the following components:   Lactic Acid, Venous 2.9 (*)    All other components within normal limits  CULTURE, BLOOD (ROUTINE X 2)  CULTURE, BLOOD (ROUTINE X 2)  URINE CULTURE  LIPASE, BLOOD  HEPATITIS PANEL, ACUTE  ACETAMINOPHEN  LEVEL  ANTINUCLEAR ANTIBODIES, IFA  ANTI-SMOOTH MUSCLE ANTIBODY, IGG  I-STAT CG4 LACTIC ACID, ED    EKG: EKG Interpretation Date/Time:  Wednesday June 13 2024 05:41:42 EST Ventricular Rate:  101 PR Interval:  140 QRS Duration:  88 QT Interval:  368 QTC Calculation: 477 R Axis:   15  Text Interpretation: Sinus tachycardia Cannot  rule out Anterior infarct , age undetermined Abnormal ECG When compared with ECG of 04-Aug-2023 21:35, PREVIOUS ECG IS PRESENT Confirmed by Griselda Norris [phone]) on 06/13/2024 5:42:31 AM  Radiology: US  Abdomen Limited RUQ (LIVER/GB) Result Date: 06/13/2024 CLINICAL DATA:  Abdominal pain. EXAM: ULTRASOUND ABDOMEN LIMITED RIGHT UPPER QUADRANT COMPARISON:  CT scan 04/05/2024 FINDINGS: Gallbladder: Gallbladder is incompletely distended which can decrease sensitivity for the detection of gallstones. Within this  limitation, no gallstones evident. Gallbladder wall thickness is upper normal at 3.1 mm and likely accentuated by underdistention. Sonographer reports no sonographic Murphy sign. Common bile duct: Diameter: 3 mm Liver: Liver is diffusely echogenic suggesting underlying fatty deposition. Portal vein is patent on color Doppler imaging with normal direction of blood flow towards the liver. Other: None. IMPRESSION: 1. Gallbladder is incompletely distended which can decrease sensitivity for the detection of gallstones. Within this limitation, no gallstones evident. Gallbladder wall thickness is upper normal at 3.1 mm and likely accentuated by underdistention. No sonographic Murphy sign. 2. Hepatic steatosis. Electronically Signed   By: Camellia Candle M.D.   On: 06/13/2024 05:51   DG Chest 1 View Result Date: 06/12/2024 CLINICAL DATA:  Cough. EXAM: CHEST  1 VIEW COMPARISON:  06/26/2022 FINDINGS: Both lungs are clear. No focal airspace disease or pulmonary edema. Heart size is normal. The trachea is midline. Negative for a pneumothorax. No acute bone abnormality. IMPRESSION: No active disease. Electronically Signed   By: Juliene Balder M.D.   On: 06/12/2024 15:54     Procedures   Medications Ordered in the ED  alum & mag hydroxide-simeth (MAALOX/MYLANTA) 200-200-20 MG/5ML suspension 30 mL (30 mLs Oral Given 06/12/24 1458)  sodium chloride  0.9 % bolus 1,000 mL (1,000 mLs Intravenous New Bag/Given 06/13/24 0559)   piperacillin -tazobactam (ZOSYN ) IVPB 3.375 g (0 g Intravenous Stopped 06/13/24 0629)  benzonatate  (TESSALON ) capsule 200 mg (200 mg Oral Given 06/13/24 0621)  iohexol  (OMNIPAQUE ) 350 MG/ML injection 100 mL (100 mLs Intravenous Contrast Given 06/13/24 9365)                                    Medical Decision Making Amount and/or Complexity of Data Reviewed Labs: ordered. Radiology: ordered.  Risk Prescription drug management. Decision regarding hospitalization.   Patient with history of PE on anticoagulation, diabetes on Actos  here for evaluation of upper abdominal pain, poor appetite and dark urine for the last 3 days.  He did have influenza starting December 26 and had symptoms with fevers for 5 days but the abdominal pain is new.  He has taken cold medicine but has not taken anything more than what is written on the label.  He does have persistent cough.  No vomiting.  He does have epigastric and right sided tenderness on examination without peritoneal findings.  He was treated with IV fluids.  He was also given Zosyn  for possible early cholangitis.  LFTs are significantly elevated and obstructive pattern.  Right upper quadrant ultrasound is negative for any acute obstruction.  His urine is also concerning for UTI-will send culture.  Discussed with Dr. Suzann with GI - recommends acute hepatitis panel, ANA, antismooth Igg, Apap.  Medicine consulted for admission for ongoing care.     Final diagnoses:  Acute hepatitis    ED Discharge Orders     None          Griselda Norris, MD 06/13/24 [phone]  "

## 2024-06-14 DIAGNOSIS — R748 Abnormal levels of other serum enzymes: Secondary | ICD-10-CM

## 2024-06-14 DIAGNOSIS — K7200 Acute and subacute hepatic failure without coma: Secondary | ICD-10-CM

## 2024-06-14 DIAGNOSIS — N12 Tubulo-interstitial nephritis, not specified as acute or chronic: Secondary | ICD-10-CM

## 2024-06-14 DIAGNOSIS — R7881 Bacteremia: Secondary | ICD-10-CM

## 2024-06-14 DIAGNOSIS — Z7901 Long term (current) use of anticoagulants: Secondary | ICD-10-CM

## 2024-06-14 DIAGNOSIS — B961 Klebsiella pneumoniae [K. pneumoniae] as the cause of diseases classified elsewhere: Secondary | ICD-10-CM

## 2024-06-14 DIAGNOSIS — E119 Type 2 diabetes mellitus without complications: Secondary | ICD-10-CM

## 2024-06-14 LAB — COMPREHENSIVE METABOLIC PANEL WITH GFR
ALT: 1814 U/L — ABNORMAL HIGH (ref 0–44)
AST: 479 U/L — ABNORMAL HIGH (ref 15–41)
Albumin: 3 g/dL — ABNORMAL LOW (ref 3.5–5.0)
Alkaline Phosphatase: 212 U/L — ABNORMAL HIGH (ref 38–126)
Anion gap: 10 (ref 5–15)
BUN: 16 mg/dL (ref 6–20)
CO2: 24 mmol/L (ref 22–32)
Calcium: 8.5 mg/dL — ABNORMAL LOW (ref 8.9–10.3)
Chloride: 91 mmol/L — ABNORMAL LOW (ref 98–111)
Creatinine, Ser: 0.62 mg/dL (ref 0.61–1.24)
GFR, Estimated: >60 mL/min
Glucose, Bld: 331 mg/dL — ABNORMAL HIGH (ref 70–99)
Potassium: 3.8 mmol/L (ref 3.5–5.1)
Sodium: 125 mmol/L — ABNORMAL LOW (ref 135–145)
Total Bilirubin: 10.9 mg/dL — ABNORMAL HIGH (ref 0.0–1.2)
Total Protein: 5.6 g/dL — ABNORMAL LOW (ref 6.5–8.1)

## 2024-06-14 LAB — CBC
HCT: 39.5 % (ref 39.0–52.0)
Hemoglobin: 13.5 g/dL (ref 13.0–17.0)
MCH: 30.7 pg (ref 26.0–34.0)
MCHC: 34.2 g/dL (ref 30.0–36.0)
MCV: 89.8 fL (ref 80.0–100.0)
Platelets: 118 K/uL — ABNORMAL LOW (ref 150–400)
RBC: 4.4 MIL/uL (ref 4.22–5.81)
RDW: 14.3 % (ref 11.5–15.5)
WBC: 7.8 K/uL (ref 4.0–10.5)
nRBC: 0 % (ref 0.0–0.2)

## 2024-06-14 LAB — ANTI-SMOOTH MUSCLE ANTIBODY, IGG: F-Actin IgG: 12 U (ref 0–19)

## 2024-06-14 LAB — GLUCOSE, CAPILLARY
Glucose-Capillary: 329 mg/dL — ABNORMAL HIGH (ref 70–99)
Glucose-Capillary: 371 mg/dL — ABNORMAL HIGH (ref 70–99)
Glucose-Capillary: 334 mg/dL — ABNORMAL HIGH (ref 70–99)
Glucose-Capillary: 331 mg/dL — ABNORMAL HIGH (ref 70–99)

## 2024-06-14 LAB — ANTINUCLEAR ANTIBODIES, IFA: ANA Ab, IFA: POSITIVE — AB

## 2024-06-14 LAB — PROTIME-INR
INR: 1.5 — ABNORMAL HIGH (ref 0.8–1.2)
Prothrombin Time: 18.5 s — ABNORMAL HIGH (ref 11.4–15.2)

## 2024-06-14 LAB — FANA STAINING PATTERNS: Speckled Pattern: 1:80 {titer}

## 2024-06-14 LAB — LACTIC ACID, PLASMA: Lactic Acid, Venous: 1.3 mmol/L (ref 0.5–1.9)

## 2024-06-14 MED ORDER — APIXABAN 5 MG PO TABS
5.0000 mg | ORAL_TABLET | Freq: Two times a day (BID) | ORAL | Status: DC
  Administered 2024-06-14 – 2024-06-17 (×6): 5 mg via ORAL
  Filled 2024-06-14 (×5): qty 1

## 2024-06-14 MED ORDER — INSULIN GLARGINE 100 UNIT/ML ~~LOC~~ SOLN
30.0000 [IU] | Freq: Two times a day (BID) | SUBCUTANEOUS | Status: DC
  Administered 2024-06-14 – 2024-06-16 (×4): 30 [IU] via SUBCUTANEOUS
  Filled 2024-06-14 (×5): qty 0.3

## 2024-06-14 MED ORDER — INSULIN ASPART 100 UNIT/ML IJ SOLN
10.0000 [IU] | Freq: Three times a day (TID) | INTRAMUSCULAR | Status: DC
  Administered 2024-06-15 – 2024-06-16 (×4): 10 [IU] via SUBCUTANEOUS
  Filled 2024-06-14 (×4): qty 10

## 2024-06-14 MED ORDER — ALBUTEROL SULFATE HFA 108 (90 BASE) MCG/ACT IN AERS: 2.0000 | INHALATION_SPRAY | Freq: Four times a day (QID) | RESPIRATORY_TRACT | Status: DC | PRN

## 2024-06-14 MED ORDER — GUAIFENESIN 100 MG/5ML PO LIQD
10.0000 mL | Freq: Four times a day (QID) | ORAL | Status: DC
  Administered 2024-06-14 – 2024-06-16 (×8): 10 mL via ORAL
  Filled 2024-06-14 (×8): qty 15

## 2024-06-14 NOTE — Progress Notes (Signed)
 " PROGRESS NOTE  Larry Fowler  FMW:[id number] DOB: 1970-07-17 DOA: 06/12/2024 PCP: Alvia Corean CROME, FNP   Brief Narrative: AAIDEN Fowler is a 54 y.o. male with medical history significant of diabetes type 2, paroxysmal A-fib, BPH, morbid obesity who presented with complaint of being weak, dehydrated, poor oral intake.  He was recently diagnosed with flu on December 26 and he dad developed fever, cough, body aches, sore throat.  Flu swab was positive for that time.  He reported persistent fever.  Lab work showed elevated liver enzymes, UA was suspicious for UTI, lactic acid was elevated.  Blood cultures Klebsiella in both sets.  Sepsis failure just improved.  Liver enzymes improving  Assessment & Plan:  Principal Problem:   Sepsis (HCC) Active Problems:   Morbid obesity (HCC)   Renal stone   OSA (obstructive sleep apnea)   Diabetes (HCC)   Uncontrolled type 2 diabetes mellitus with hyperglycemia (HCC)   Saddle pulmonary embolus (HCC)   History of pulmonary embolism   Elevated LFTs   Acute hepatitis   Severe sepsis secondary to UTI/pyelonephritis/Klebsiella bacteremia: Presented with generalized weakness, mild -grade fever, sinus tachycardia.  Lab work showed elevated lactate.  UA suspicious for UTI.  CT abdomen/pelvis shows possible right-sided pyelonephritis with nonobstructing stone. Blood cultures showing Klebsiella.  Likely source is urine.  Currently on ceftriaxone .  Afebrile this morning.  Lactic acidosis resolved   Elevated liver enzymes: Unclear etiology.  Likely from sepsis or drug-induced.  Autoimmune panel pending/ANA positive.  Pending CMV/EBV.  Hepatitis panel negative.  Right upper quadrant ultrasound does not show any signs of cholecystitis or obstruction.  Tylenol  level less than 10.  Significantly improving today.   Uncontrolled diabetes type 2: A1c of 13.3.  Only takes pioglitazone  at home.  Diabetic coordinator consulted.  Continue current insulin  regimen.   Monitor blood sugars.  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  Currently on room air.   Hyponatremia: Sodium in the range of 120s on presentation this is most likely from pseudohyponatremia from hyperglycemia.  Should improve with improvement of blood glucose   Morbid obesity: BMI 56   OSA: Does not use CPAP  Deconditioning: PT consulted          DVT prophylaxis: apixaban  (ELIQUIS ) tablet 5 mg     Code Status: Prior  Family Communication: Wife at bedside on 1/8  Patient status:Inpatient  Patient is from :home  Anticipated discharge un:ynfz  Estimated DC date:1-2 days   Consultants: GI  Procedures:None  Antimicrobials:  Anti-infectives (From admission, onward)    Start     Dose/Rate Route Frequency Ordered Stop   06/13/24 2230  cefTRIAXone  (ROCEPHIN ) 2 g in sodium chloride  0.9 % 100 mL IVPB        2 g 200 mL/hr over 30 Minutes Intravenous Every 24 hours 06/13/24 2144     06/13/24 0800  piperacillin -tazobactam (ZOSYN ) IVPB 3.375 g  Status:  Discontinued        3.375 g 12.5 mL/hr over 240 Minutes Intravenous Every 8 hours 06/13/24 0748 06/13/24 2144   06/13/24 0445  piperacillin -tazobactam (ZOSYN ) IVPB 3.375 g        3.375 g 100 mL/hr over 30 Minutes Intravenous  Once 06/13/24 0436 06/13/24 9370       Subjective: Patient seen and examined at bedside today.  Feels much better today.  Afebrile this morning.  Still has some dry cough.  But significantly better looking today.  No abdomen pain, nausea or vomiting.  Objective: Vitals:   06/13/24 1538 06/13/24 2107 06/14/24 0311 06/14/24 0803  BP: 108/69 122/60 110/72 130/75  Pulse: 96 91 92 85  Resp: 18 16 20 18   Temp: 98.8 F (37.1 C) 99.9 F (37.7 C) 98 F (36.7 C) 99.2 F (37.3 C)  TempSrc: Oral Oral Oral   SpO2: 91% 93% 91% 95%    Intake/Output Summary (Last 24 hours) at 06/14/2024 1303 Last data filed  at 06/14/2024 0830 Gross per 24 hour  Intake 3369.21 ml  Output 600 ml  Net 2769.21 ml   There were no vitals filed for this visit.  Examination:  General exam: Overall comfortable, not in distress, morbidly obese HEENT: PERRL Respiratory system:  no wheezes or crackles  Cardiovascular system: S1 & S2 heard, RRR.  Gastrointestinal system: Abdomen is nondistended, soft and nontender. Central nervous system: Alert and oriented Extremities: No edema, no clubbing ,no cyanosis Skin: No rashes, no ulcers,no icterus     Data Reviewed: I have personally reviewed following labs and imaging studies  CBC: Recent Labs  Lab 06/12/24 1455 06/14/24 0442  WBC 7.9 7.8  NEUTROABS 6.2  --   HGB 15.0 13.5  HCT 44.8 39.5  MCV 92.4 89.8  PLT 132* 118*   Basic Metabolic Panel: Recent Labs  Lab 06/12/24 1455 06/13/24 0800 06/14/24 0442  NA 121* 122* 125*  K 4.4 4.4 3.8  CL 85* 87* 91*  CO2 19* 21* 24  GLUCOSE 468* 447* 331*  BUN 13 16 16   CREATININE 0.65 0.55* 0.62  CALCIUM 9.0 8.5* 8.5*     Recent Results (from the past 240 hours)  Culture, blood (routine x 2)     Status: Abnormal (Preliminary result)   Collection Time: 06/13/24  4:34 AM   Specimen: BLOOD  Result Value Ref Range Status   Specimen Description BLOOD SITE NOT SPECIFIED  Final   Special Requests   Final    BOTTLES DRAWN AEROBIC AND ANAEROBIC Blood Culture results may not be optimal due to an inadequate volume of blood received in culture bottles   Culture  Setup Time   Final    GRAM NEGATIVE RODS IN BOTH AEROBIC AND ANAEROBIC BOTTLES CRITICAL RESULT CALLED TO, READ BACK BY AND VERIFIED WITH: PHARMD EMERSON GLATTER [id number] @ 2140 FH    Culture (A)  Final    KLEBSIELLA PNEUMONIAE SUSCEPTIBILITIES TO FOLLOW Performed at [facility], [address], [place], [place] [address]    Report Status PENDING  Incomplete  Blood Culture ID Panel (Reflexed)     Status: Abnormal   Collection Time: 06/13/24  4:34 AM   Result Value Ref Range Status   Enterococcus faecalis NOT DETECTED NOT DETECTED Final   Enterococcus Faecium NOT DETECTED NOT DETECTED Final   Listeria monocytogenes NOT DETECTED NOT DETECTED Final   Staphylococcus species NOT DETECTED NOT DETECTED Final   Staphylococcus aureus (BCID) NOT DETECTED NOT DETECTED Final   Staphylococcus epidermidis NOT DETECTED NOT DETECTED Final   Staphylococcus lugdunensis NOT DETECTED NOT DETECTED Final   Streptococcus species NOT DETECTED NOT DETECTED Final   Streptococcus agalactiae NOT DETECTED NOT DETECTED Final   Streptococcus pneumoniae NOT DETECTED NOT DETECTED Final   Streptococcus pyogenes NOT DETECTED NOT DETECTED Final   A.calcoaceticus-baumannii NOT DETECTED NOT DETECTED Final   Bacteroides fragilis NOT DETECTED NOT DETECTED Final   Enterobacterales DETECTED (A) NOT DETECTED Final  Comment: Enterobacterales represent a large order of gram negative bacteria, not a single organism. CRITICAL RESULT CALLED TO, READ BACK BY AND VERIFIED WITH: PHARMD EMERSON GLATTER [id number] @ 2140 FH    Enterobacter cloacae complex NOT DETECTED NOT DETECTED Final   Escherichia coli NOT DETECTED NOT DETECTED Final   Klebsiella aerogenes NOT DETECTED NOT DETECTED Final   Klebsiella oxytoca NOT DETECTED NOT DETECTED Final   Klebsiella pneumoniae DETECTED (A) NOT DETECTED Final    Comment: CRITICAL RESULT CALLED TO, READ BACK BY AND VERIFIED WITH: PHARMD EMERSON GLATTER [id number] @ 2140 FH    Proteus species NOT DETECTED NOT DETECTED Final   Salmonella species NOT DETECTED NOT DETECTED Final   Serratia marcescens NOT DETECTED NOT DETECTED Final   Haemophilus influenzae NOT DETECTED NOT DETECTED Final   Neisseria meningitidis NOT DETECTED NOT DETECTED Final   Pseudomonas aeruginosa NOT DETECTED NOT DETECTED Final   Stenotrophomonas maltophilia NOT DETECTED NOT DETECTED Final   Candida albicans NOT DETECTED NOT DETECTED Final   Candida auris NOT DETECTED NOT DETECTED Final    Candida glabrata NOT DETECTED NOT DETECTED Final   Candida krusei NOT DETECTED NOT DETECTED Final   Candida parapsilosis NOT DETECTED NOT DETECTED Final   Candida tropicalis NOT DETECTED NOT DETECTED Final   Cryptococcus neoformans/gattii NOT DETECTED NOT DETECTED Final   CTX-M ESBL NOT DETECTED NOT DETECTED Final   Carbapenem resistance IMP NOT DETECTED NOT DETECTED Final   Carbapenem resistance KPC NOT DETECTED NOT DETECTED Final   Carbapenem resistance NDM NOT DETECTED NOT DETECTED Final   Carbapenem resist OXA 48 LIKE NOT DETECTED NOT DETECTED Final   Carbapenem resistance VIM NOT DETECTED NOT DETECTED Final    Comment: Performed at [facility], [address], [place], [place] [address]  Culture, blood (routine x 2)     Status: Abnormal (Preliminary result)   Collection Time: 06/13/24  4:39 AM   Specimen: BLOOD  Result Value Ref Range Status   Specimen Description BLOOD SITE NOT SPECIFIED  Final   Special Requests   Final    BOTTLES DRAWN AEROBIC AND ANAEROBIC Blood Culture adequate volume   Culture  Setup Time   Final    GRAM NEGATIVE RODS IN BOTH AEROBIC AND ANAEROBIC BOTTLES CRITICAL VALUE NOTED.  VALUE IS CONSISTENT WITH PREVIOUSLY REPORTED AND CALLED VALUE. Performed at [facility], [address], [place], [place] [address]    Culture KLEBSIELLA PNEUMONIAE (A)  Final   Report Status PENDING  Incomplete  Urine Culture     Status: Abnormal (Preliminary result)   Collection Time: 06/13/24  5:59 AM   Specimen: Urine, Clean Catch  Result Value Ref Range Status   Specimen Description URINE, CLEAN CATCH  Final   Special Requests   Final    NONE Performed at [facility], [address] [place], [place] [address]    Culture >=100,000 COLONIES/mL KLEBSIELLA PNEUMONIAE (A)  Final   Report Status PENDING  Incomplete     Radiology Studies: CT ABDOMEN PELVIS W CONTRAST Result Date: 06/13/2024 CLINICAL DATA:  Abdominal pain.  Dehydration.  CT. EXAM: CT  ABDOMEN AND PELVIS WITH CONTRAST TECHNIQUE: Multidetector CT imaging of the abdomen and pelvis was performed using the standard protocol following bolus administration of intravenous contrast. RADIATION DOSE REDUCTION: This exam was performed according to the departmental dose-optimization program which includes automated exposure control, adjustment of the mA and/or kV according to patient size and/or use of iterative reconstruction technique. CONTRAST:  100mL  OMNIPAQUE  IOHEXOL  350 MG/ML SOLN COMPARISON:  04/05/2024 FINDINGS: Lower chest: No acute findings. Hepatobiliary: No suspicious focal abnormality within the liver parenchyma. Gallbladder is decompressed. No intrahepatic or extrahepatic biliary dilation. Pancreas: No focal mass lesion. No dilatation of the main duct. No intraparenchymal cyst. No peripancreatic edema. Spleen: No splenomegaly. No suspicious focal mass lesion. Adrenals/Urinary Tract: No adrenal nodule or mass. 10 x 8 mm nonobstructing stone identified lower pole right kidney. There is subtle right perinephric edema and a suggestion of segmental hypoperfusion in the right kidney (see posterolateral interpolar right kidney on 41/2 and lower pole right kidney on 51/2). Left kidney unremarkable. No evidence for hydroureter. The urinary bladder appears normal for the degree of distention. Stomach/Bowel: Tiny hiatal hernia. Stomach is decompressed. Duodenum is normally positioned as is the ligament of Treitz. No small bowel wall thickening. No small bowel dilatation. The terminal ileum is normal. The appendix is normal. No gross colonic mass. No colonic wall thickening. Vascular/Lymphatic: No abdominal aortic aneurysm. No abdominal aortic atherosclerotic calcification. There is no gastrohepatic or hepatoduodenal ligament lymphadenopathy. No retroperitoneal or mesenteric lymphadenopathy. No pelvic sidewall lymphadenopathy. Reproductive: The prostate gland and seminal vesicles are unremarkable. Other: No  substantial intraperitoneal free fluid. Musculoskeletal: No worrisome lytic or sclerotic osseous abnormality. IMPRESSION: 1. Subtle right perinephric edema with a suggestion of segmental hypoperfusion in the right kidney. Imaging features suspicious for pyelonephritis. 2. Associated 10 x 8 mm nonobstructing stone lower pole right kidney. 3. Tiny hiatal hernia. Electronically Signed   By: Camellia Candle M.D.   On: 06/13/2024 07:01   US  Abdomen Limited RUQ (LIVER/GB) Result Date: 06/13/2024 CLINICAL DATA:  Abdominal pain. EXAM: ULTRASOUND ABDOMEN LIMITED RIGHT UPPER QUADRANT COMPARISON:  CT scan 04/05/2024 FINDINGS: Gallbladder: Gallbladder is incompletely distended which can decrease sensitivity for the detection of gallstones. Within this limitation, no gallstones evident. Gallbladder wall thickness is upper normal at 3.1 mm and likely accentuated by underdistention. Sonographer reports no sonographic Murphy sign. Common bile duct: Diameter: 3 mm Liver: Liver is diffusely echogenic suggesting underlying fatty deposition. Portal vein is patent on color Doppler imaging with normal direction of blood flow towards the liver. Other: None. IMPRESSION: 1. Gallbladder is incompletely distended which can decrease sensitivity for the detection of gallstones. Within this limitation, no gallstones evident. Gallbladder wall thickness is upper normal at 3.1 mm and likely accentuated by underdistention. No sonographic Murphy sign. 2. Hepatic steatosis. Electronically Signed   By: Camellia Candle M.D.   On: 06/13/2024 05:51   DG Chest 1 View Result Date: 06/12/2024 CLINICAL DATA:  Cough. EXAM: CHEST  1 VIEW COMPARISON:  06/26/2022 FINDINGS: Both lungs are clear. No focal airspace disease or pulmonary edema. Heart size is normal. The trachea is midline. Negative for a pneumothorax. No acute bone abnormality. IMPRESSION: No active disease. Electronically Signed   By: Juliene Balder M.D.   On: 06/12/2024 15:54    Scheduled Meds:   apixaban   5 mg Oral BID   chlorpheniramine-HYDROcodone   5 mL Oral Q12H   guaiFENesin   10 mL Oral Q6H   insulin  aspart  0-20 Units Subcutaneous TID WC   insulin  aspart  0-5 Units Subcutaneous QHS   insulin  aspart  7 Units Subcutaneous TID WC   insulin  glargine  30 Units Subcutaneous BID   tamsulosin   0.4 mg Oral Daily   Continuous Infusions:  sodium chloride  125 mL/hr at 06/14/24 0226   cefTRIAXone  (ROCEPHIN )  IV 2 g (06/13/24 2211)     LOS: 1 day   Jehu Mccauslin,  MD [facility] P1/01/2025, 1:03 PM  PROGRESS NOTE  MAHAMED ZALEWSKI  FMW:[id number] DOB: [birth year] DOA: 06/12/2024 PCP: Alvia Corean CROME, FNP   Brief Narrative:   Assessment & Plan:  Principal Problem:   Sepsis [facility]) Active Problems:   Morbid obesity (HCC)   Renal stone   OSA (obstructive sleep apnea)   Diabetes (HCC)   Uncontrolled type 2 diabetes mellitus with hyperglycemia (HCC)   Saddle pulmonary embolus (HCC)   History of pulmonary embolism   Elevated LFTs   Acute hepatitis           DVT prophylaxis: apixaban  (ELIQUIS ) tablet 5 mg     Code Status: Prior  Family Communication:   Patient status:  Patient is from :  Anticipated discharge to:  Estimated DC date:   Consultants:   Procedures:  Antimicrobials:  Anti-infectives (From admission, onward)    Start     Dose/Rate Route Frequency Ordered Stop   06/13/24 2230  cefTRIAXone  (ROCEPHIN ) 2 g in sodium chloride  0.9 % 100 mL IVPB        2 g 200 mL/hr over 30 Minutes Intravenous Every 24 hours 06/13/24 2144     06/13/24 0800  piperacillin -tazobactam (ZOSYN ) IVPB 3.375 g  Status:  Discontinued        3.375 g 12.5 mL/hr over 240 Minutes Intravenous Every 8 hours 06/13/24 0748 06/13/24 2144   06/13/24 0445  piperacillin -tazobactam (ZOSYN ) IVPB 3.375 g        3.375 g 100 mL/hr over 30 Minutes Intravenous  Once 06/13/24 0436 06/13/24 0629       Subjective:   Objective: Vitals:   06/13/24 1538 06/13/24 2107  06/14/24 0311 06/14/24 0803  BP: 108/69 122/60 110/72 130/75  Pulse: 96 91 92 85  Resp: 18 16 20 18   Temp: 98.8 F (37.1 C) 99.9 F (37.7 C) 98 F (36.7 C) 99.2 F (37.3 C)  TempSrc: Oral Oral Oral   SpO2: 91% 93% 91% 95%    Intake/Output Summary (Last 24 hours) at 06/14/2024 1303 Last data filed at 06/14/2024 0830 Gross per 24 hour  Intake 3369.21 ml  Output 600 ml  Net 2769.21 ml   There were no vitals filed for this visit.  Examination:  General exam: Overall comfortable, not in distress HEENT: PERRL Respiratory system:  no wheezes or crackles  Cardiovascular system: S1 & S2 heard, RRR.  Gastrointestinal system: Abdomen is nondistended, soft and nontender. Central nervous system: Alert and oriented Extremities: No edema, no clubbing ,no cyanosis Skin: No rashes, no ulcers,no icterus     Data Reviewed: I have personally reviewed following labs and imaging studies  CBC: Recent Labs  Lab 06/12/24 1455 06/14/24 0442  WBC 7.9 7.8  NEUTROABS 6.2  --   HGB 15.0 13.5  HCT 44.8 39.5  MCV 92.4 89.8  PLT 132* 118*   Basic Metabolic Panel: Recent Labs  Lab 06/12/24 1455 06/13/24 0800 06/14/24 0442  NA 121* 122* 125*  K 4.4 4.4 3.8  CL 85* 87* 91*  CO2 19* 21* 24  GLUCOSE 468* 447* 331*  BUN 13 16 16   CREATININE 0.65 0.55* 0.62  CALCIUM 9.0 8.5* 8.5*     Recent Results (from the past 240 hours)  Culture, blood (routine x 2)     Status: Abnormal (Preliminary result)   Collection Time: 06/13/24  4:34 AM   Specimen: BLOOD  Result Value Ref Range Status   Specimen Description BLOOD SITE NOT SPECIFIED  Final   Special Requests  Final    BOTTLES DRAWN AEROBIC AND ANAEROBIC Blood Culture results may not be optimal due to an inadequate volume of blood received in culture bottles   Culture  Setup Time   Final    GRAM NEGATIVE RODS IN BOTH AEROBIC AND ANAEROBIC BOTTLES CRITICAL RESULT CALLED TO, READ BACK BY AND VERIFIED WITH: PHARMD EMERSON GLATTER [id number] @ 2140 FH     Culture (A)  Final    KLEBSIELLA PNEUMONIAE SUSCEPTIBILITIES TO FOLLOW Performed at [facility], [address] [place], [place] [address]    Report Status PENDING  Incomplete  Blood Culture ID Panel (Reflexed)     Status: Abnormal   Collection Time: 06/13/24  4:34 AM  Result Value Ref Range Status   Enterococcus faecalis NOT DETECTED NOT DETECTED Final   Enterococcus Faecium NOT DETECTED NOT DETECTED Final   Listeria monocytogenes NOT DETECTED NOT DETECTED Final   Staphylococcus species NOT DETECTED NOT DETECTED Final   Staphylococcus aureus (BCID) NOT DETECTED NOT DETECTED Final   Staphylococcus epidermidis NOT DETECTED NOT DETECTED Final   Staphylococcus lugdunensis NOT DETECTED NOT DETECTED Final   Streptococcus species NOT DETECTED NOT DETECTED Final   Streptococcus agalactiae NOT DETECTED NOT DETECTED Final   Streptococcus pneumoniae NOT DETECTED NOT DETECTED Final   Streptococcus pyogenes NOT DETECTED NOT DETECTED Final   A.calcoaceticus-baumannii NOT DETECTED NOT DETECTED Final   Bacteroides fragilis NOT DETECTED NOT DETECTED Final   Enterobacterales DETECTED (A) NOT DETECTED Final    Comment: Enterobacterales represent a large order of gram negative bacteria, not a single organism. CRITICAL RESULT CALLED TO, READ BACK BY AND VERIFIED WITH: PHARMD EMERSON GLATTER [id number] @ 2140 FH    Enterobacter cloacae complex NOT DETECTED NOT DETECTED Final   Escherichia coli NOT DETECTED NOT DETECTED Final   Klebsiella aerogenes NOT DETECTED NOT DETECTED Final   Klebsiella oxytoca NOT DETECTED NOT DETECTED Final   Klebsiella pneumoniae DETECTED (A) NOT DETECTED Final    Comment: CRITICAL RESULT CALLED TO, READ BACK BY AND VERIFIED WITH: PHARMD EMERSON GLATTER [id number] @ 2140 FH    Proteus species NOT DETECTED NOT DETECTED Final   Salmonella species NOT DETECTED NOT DETECTED Final   Serratia marcescens NOT DETECTED NOT DETECTED Final   Haemophilus influenzae NOT DETECTED NOT DETECTED  Final   Neisseria meningitidis NOT DETECTED NOT DETECTED Final   Pseudomonas aeruginosa NOT DETECTED NOT DETECTED Final   Stenotrophomonas maltophilia NOT DETECTED NOT DETECTED Final   Candida albicans NOT DETECTED NOT DETECTED Final   Candida auris NOT DETECTED NOT DETECTED Final   Candida glabrata NOT DETECTED NOT DETECTED Final   Candida krusei NOT DETECTED NOT DETECTED Final   Candida parapsilosis NOT DETECTED NOT DETECTED Final   Candida tropicalis NOT DETECTED NOT DETECTED Final   Cryptococcus neoformans/gattii NOT DETECTED NOT DETECTED Final   CTX-M ESBL NOT DETECTED NOT DETECTED Final   Carbapenem resistance IMP NOT DETECTED NOT DETECTED Final   Carbapenem resistance KPC NOT DETECTED NOT DETECTED Final   Carbapenem resistance NDM NOT DETECTED NOT DETECTED Final   Carbapenem resist OXA 48 LIKE NOT DETECTED NOT DETECTED Final   Carbapenem resistance VIM NOT DETECTED NOT DETECTED Final    Comment: Performed at [facility], [address] [place], [place] [address]  Culture, blood (routine x 2)     Status: Abnormal (Preliminary result)   Collection Time: 06/13/24  4:39 AM   Specimen: BLOOD  Result Value Ref Range Status   Specimen Description BLOOD SITE NOT  SPECIFIED  Final   Special Requests   Final    BOTTLES DRAWN AEROBIC AND ANAEROBIC Blood Culture adequate volume   Culture  Setup Time   Final    GRAM NEGATIVE RODS IN BOTH AEROBIC AND ANAEROBIC BOTTLES CRITICAL VALUE NOTED.  VALUE IS CONSISTENT WITH PREVIOUSLY REPORTED AND CALLED VALUE. Performed at [facility], [address], [place], [place] [address]    Culture KLEBSIELLA PNEUMONIAE (A)  Final   Report Status PENDING  Incomplete  Urine Culture     Status: Abnormal (Preliminary result)   Collection Time: 06/13/24  5:59 AM   Specimen: Urine, Clean Catch  Result Value Ref Range Status   Specimen Description URINE, CLEAN CATCH  Final   Special Requests   Final    NONE Performed at [facility]  Lab, [address] [place], [place] [address]    Culture >=100,000 COLONIES/mL KLEBSIELLA PNEUMONIAE (A)  Final   Report Status PENDING  Incomplete     Radiology Studies: CT ABDOMEN PELVIS W CONTRAST Result Date: 06/13/2024 CLINICAL DATA:  Abdominal pain.  Dehydration.  CT. EXAM: CT ABDOMEN AND PELVIS WITH CONTRAST TECHNIQUE: Multidetector CT imaging of the abdomen and pelvis was performed using the standard protocol following bolus administration of intravenous contrast. RADIATION DOSE REDUCTION: This exam was performed according to the departmental dose-optimization program which includes automated exposure control, adjustment of the mA and/or kV according to patient size and/or use of iterative reconstruction technique. CONTRAST:  OMNIPAQUE  IOHEXOL  350 MG/ML SOLN COMPARISON:  04/05/2024 FINDINGS: Lower chest: No acute findings. Hepatobiliary: No suspicious focal abnormality within the liver parenchyma. Gallbladder is decompressed. No intrahepatic or extrahepatic biliary dilation. Pancreas: No focal mass lesion. No dilatation of the main duct. No intraparenchymal cyst. No peripancreatic edema. Spleen: No splenomegaly. No suspicious focal mass lesion. Adrenals/Urinary Tract: No adrenal nodule or mass. 10 x 8 mm nonobstructing stone identified lower pole right kidney. There is subtle right perinephric edema and a suggestion of segmental hypoperfusion in the right kidney (see posterolateral interpolar right kidney on 41/2 and lower pole right kidney on 51/2). Left kidney unremarkable. No evidence for hydroureter. The urinary bladder appears normal for the degree of distention. Stomach/Bowel: Tiny hiatal hernia. Stomach is decompressed. Duodenum is normally positioned as is the ligament of Treitz. No small bowel wall thickening. No small bowel dilatation. The terminal ileum is normal. The appendix is normal. No gross colonic mass. No colonic wall thickening. Vascular/Lymphatic: No abdominal aortic aneurysm.  No abdominal aortic atherosclerotic calcification. There is no gastrohepatic or hepatoduodenal ligament lymphadenopathy. No retroperitoneal or mesenteric lymphadenopathy. No pelvic sidewall lymphadenopathy. Reproductive: The prostate gland and seminal vesicles are unremarkable. Other: No substantial intraperitoneal free fluid. Musculoskeletal: No worrisome lytic or sclerotic osseous abnormality. IMPRESSION: 1. Subtle right perinephric edema with a suggestion of segmental hypoperfusion in the right kidney. Imaging features suspicious for pyelonephritis. 2. Associated 10 x 8 mm nonobstructing stone lower pole right kidney. 3. Tiny hiatal hernia. Electronically Signed   By: Camellia Candle M.D.   On: 06/13/2024 07:01   US  Abdomen Limited RUQ (LIVER/GB) Result Date: 06/13/2024 CLINICAL DATA:  Abdominal pain. EXAM: ULTRASOUND ABDOMEN LIMITED RIGHT UPPER QUADRANT COMPARISON:  CT scan 04/05/2024 FINDINGS: Gallbladder: Gallbladder is incompletely distended which can decrease sensitivity for the detection of gallstones. Within this limitation, no gallstones evident. Gallbladder wall thickness is upper normal at 3.1 mm and likely accentuated by underdistention. Sonographer reports no sonographic Murphy sign. Common bile duct: Diameter: 3 mm Liver: Liver is  diffusely echogenic suggesting underlying fatty deposition. Portal vein is patent on color Doppler imaging with normal direction of blood flow towards the liver. Other: None. IMPRESSION: 1. Gallbladder is incompletely distended which can decrease sensitivity for the detection of gallstones. Within this limitation, no gallstones evident. Gallbladder wall thickness is upper normal at 3.1 mm and likely accentuated by underdistention. No sonographic Murphy sign. 2. Hepatic steatosis. Electronically Signed   By: Camellia Candle M.D.   On: 06/13/2024 05:51   DG Chest 1 View Result Date: 06/12/2024 CLINICAL DATA:  Cough. EXAM: CHEST  1 VIEW COMPARISON:  06/26/2022 FINDINGS: Both  lungs are clear. No focal airspace disease or pulmonary edema. Heart size is normal. The trachea is midline. Negative for a pneumothorax. No acute bone abnormality. IMPRESSION: No active disease. Electronically Signed   By: Juliene Balder M.D.   On: 06/12/2024 15:54    Scheduled Meds:  apixaban   5 mg Oral BID   chlorpheniramine-HYDROcodone   5 mL Oral Q12H   guaiFENesin   10 mL Oral Q6H   insulin  aspart  0-20 Units Subcutaneous TID WC   insulin  aspart  0-5 Units Subcutaneous QHS   insulin  aspart  7 Units Subcutaneous TID WC   insulin  glargine  30 Units Subcutaneous BID   tamsulosin   0.4 mg Oral Daily   Continuous Infusions:  sodium chloride  125 mL/hr at 06/14/24 0226   cefTRIAXone  (ROCEPHIN )  IV 2 g (06/13/24 2211)     LOS: 1 day   Ivonne Mustache, MD [facility] P1/01/2025, 1:03 PM  "

## 2024-06-14 NOTE — Plan of Care (Signed)
  Problem: Coping: Goal: Ability to adjust to condition or change in health will improve Outcome: Progressing   Problem: Fluid Volume: Goal: Ability to maintain a balanced intake and output will improve Outcome: Progressing   Problem: Health Behavior/Discharge Planning: Goal: Ability to manage health-related needs will improve Outcome: Progressing

## 2024-06-14 NOTE — Progress Notes (Addendum)
 "    Daily Progress Note  DOA: 06/12/2024 Hospital Day: 3  Cc: Abnormal liver chemistries  Assessment and Plan:   54 yo male with recent reported influenza admitted with fever, weakness, severe sepsis 2/2 to UTI / pyelonephritis / Klebsiella bacteremia. Found to have  markedly abnormal liver chemistries in mixed pattern. CT scan showing subtle right perinephric edema with a suggestion of segmental hypoperfusion in the right kidney. Imaging features suspicious for pyelonephritis. Associated 10 x 8 mm nonobstructing stone lower pole right kidney.  Today T max 99.2. Feels much better today. Getting Rocephin .    Abnormal liver chemistries, mixed pattern Based on workup so far , suspect viral etiology and also sepsis could be contributing. ANA is positive, titer 1:180. Autoimmune liver disease not rule out but seems less likely given rapid improvement with treatment.  Could be DILI but cannot directly correlate with medications time wise. He did have Augmentin  but it was a month ago.   Liver enzymes continues to trend down. Tbili still rising but not unexpected at this point. Normal mentation. INR was elevated on Eliquis .  Awaiting CMV, EBV, ASMA, ASMA   History of PE On Eliquis    Type 2 diabetes.   Recently started Actos    Morbid obesity   Principal Problem:   Sepsis (HCC) Active Problems:   Morbid obesity (HCC)   Renal stone   OSA (obstructive sleep apnea)   Diabetes (HCC)   Uncontrolled type 2 diabetes mellitus with hyperglycemia (HCC)   Saddle pulmonary embolus (HCC)   History of pulmonary embolism   Elevated LFTs   Acute hepatitis   Subjective Overall feels much better today compared to yesterday/  No abdominal pain    Objective   Investigation of abnormal liver chemistries:  Patient taking no more than 2 tylenol  a day and takes only one if takes a hydrocodone .  Took course of Augmentin  (urinary infection?) but was a month ago.  Took two days of Doxycycline   around New Years.  Recently started Actos . Other than that, no new medications.  Diflucan  on home med list but hasn't taken in at least a month. Rare etoh use No know FMH of liver disease No reason to suspect ischemia in absence of documented hypotension /syncope Acute hepatitis panel neg ANA + Acetaminophen  level negative Autoimmune labs pending Normal appearing liver on imaging. No biliary duct dilation   Recent Labs    06/12/24 1455 06/14/24 0442  WBC 7.9 7.8  HGB 15.0 13.5  HCT 44.8 39.5  MCV 92.4 89.8  PLT 132* 118*   No results for input(s): FOLATE, VITAMINB12, FERRITIN, TIBC, IRONPCTSAT in the last 72 hours. Recent Labs    06/12/24 1455 06/13/24 0800 06/14/24 0442  NA 121* 122* 125*  K 4.4 4.4 3.8  CL 85* 87* 91*  CO2 19* 21* 24  GLUCOSE 468* 447* 331*  BUN 13 16 16   CREATININE 0.65 0.55* 0.62  CALCIUM 9.0 8.5* 8.5*   Recent Labs    06/12/24 1455 06/13/24 0800 06/14/24 0442  PROT 6.2* 5.7* 5.6*  ALBUMIN 3.4* 3.1* 3.0*  AST 3,742* 1,249* 479*  ALT 4,543* 2,842* 1,814*  ALKPHOS 223* 214* 212*  BILITOT 8.0* 9.7* 10.9*      Imaging:  CT ABDOMEN PELVIS W CONTRAST CLINICAL DATA:  Abdominal pain.  Dehydration.  CT.  EXAM: CT ABDOMEN AND PELVIS WITH CONTRAST  TECHNIQUE: Multidetector CT imaging of the abdomen and pelvis was performed using the standard protocol following bolus administration of intravenous contrast.  RADIATION  DOSE REDUCTION: This exam was performed according to the departmental dose-optimization program which includes automated exposure control, adjustment of the mA and/or kV according to patient size and/or use of iterative reconstruction technique.  CONTRAST:  OMNIPAQUE  IOHEXOL  350 MG/ML SOLN  COMPARISON:  04/05/2024  FINDINGS: Lower chest: No acute findings.  Hepatobiliary: No suspicious focal abnormality within the liver parenchyma. Gallbladder is decompressed. No intrahepatic or extrahepatic biliary  dilation.  Pancreas: No focal mass lesion. No dilatation of the main duct. No intraparenchymal cyst. No peripancreatic edema.  Spleen: No splenomegaly. No suspicious focal mass lesion.  Adrenals/Urinary Tract: No adrenal nodule or mass. 10 x 8 mm nonobstructing stone identified lower pole right kidney. There is subtle right perinephric edema and a suggestion of segmental hypoperfusion in the right kidney (see posterolateral interpolar right kidney on 41/2 and lower pole right kidney on 51/2). Left kidney unremarkable. No evidence for hydroureter. The urinary bladder appears normal for the degree of distention.  Stomach/Bowel: Tiny hiatal hernia. Stomach is decompressed. Duodenum is normally positioned as is the ligament of Treitz. No small bowel wall thickening. No small bowel dilatation. The terminal ileum is normal. The appendix is normal. No gross colonic mass. No colonic wall thickening.  Vascular/Lymphatic: No abdominal aortic aneurysm. No abdominal aortic atherosclerotic calcification. There is no gastrohepatic or hepatoduodenal ligament lymphadenopathy. No retroperitoneal or mesenteric lymphadenopathy. No pelvic sidewall lymphadenopathy.  Reproductive: The prostate gland and seminal vesicles are unremarkable.  Other: No substantial intraperitoneal free fluid.  Musculoskeletal: No worrisome lytic or sclerotic osseous abnormality.  IMPRESSION: 1. Subtle right perinephric edema with a suggestion of segmental hypoperfusion in the right kidney. Imaging features suspicious for pyelonephritis. 2. Associated 10 x 8 mm nonobstructing stone lower pole right kidney. 3. Tiny hiatal hernia.  Electronically Signed   By: Camellia Candle M.D.   On: 06/13/2024 07:01 US  Abdomen Limited RUQ (LIVER/GB) CLINICAL DATA:  Abdominal pain.  EXAM: ULTRASOUND ABDOMEN LIMITED RIGHT UPPER QUADRANT  COMPARISON:  CT scan 04/05/2024  FINDINGS: Gallbladder:  Gallbladder is incompletely  distended which can decrease sensitivity for the detection of gallstones. Within this limitation, no gallstones evident. Gallbladder wall thickness is upper normal at 3.1 mm and likely accentuated by underdistention. Sonographer reports no sonographic Murphy sign.  Common bile duct:  Diameter: 3 mm  Liver:  Liver is diffusely echogenic suggesting underlying fatty deposition. Portal vein is patent on color Doppler imaging with normal direction of blood flow towards the liver.  Other: None.  IMPRESSION: 1. Gallbladder is incompletely distended which can decrease sensitivity for the detection of gallstones. Within this limitation, no gallstones evident. Gallbladder wall thickness is upper normal at 3.1 mm and likely accentuated by underdistention. No sonographic Murphy sign. 2. Hepatic steatosis.  Electronically Signed   By: Camellia Candle M.D.   On: 06/13/2024 05:51     Scheduled inpatient medications:   apixaban   5 mg Oral BID   chlorpheniramine-HYDROcodone   5 mL Oral Q12H   guaiFENesin   10 mL Oral Q6H   insulin  aspart  0-20 Units Subcutaneous TID WC   insulin  aspart  0-5 Units Subcutaneous QHS   insulin  aspart  7 Units Subcutaneous TID WC   insulin  glargine  30 Units Subcutaneous BID   tamsulosin   0.4 mg Oral Daily   Continuous inpatient infusions:   cefTRIAXone  (ROCEPHIN )  IV 2 g (06/13/24 2211)   PRN inpatient medications: albuterol , ondansetron  (ZOFRAN ) IV  Vital signs in last 24 hours: Temp:  [98 F (36.7 C)-99.9 F (37.7 C)] 99.2  F (37.3 C) (01/08 0803) Pulse Rate:  [85-96] 85 (01/08 0803) Resp:  [16-20] 18 (01/08 0803) BP: (108-130)/(60-75) 130/75 (01/08 0803) SpO2:  [91 %-95 %] 95 % (01/08 0803) Last BM Date : 06/12/24  Intake/Output Summary (Last 24 hours) at 06/14/2024 1344 Last data filed at 06/14/2024 1230 Gross per 24 hour  Intake 3589.21 ml  Output 600 ml  Net 2989.21 ml    Intake/Output from previous day: 01/07 0701 - 01/08 0700 In: 3149.2  [P.O.:1000; I.V.:2001.2; IV Piggyback:148] Out: 300 [Urine:300] Intake/Output this shift: Total I/O In: 440 [P.O.:440] Out: 300 [Urine:300]   Physical Exam:  General: Alert male in NAD Heart:  Regular rate and rhythm.  Pulmonary: Normal respiratory effort Abdomen: Soft, nondistended, nontender. Normal bowel sounds. Extremities: No lower extremity edema  Neurologic: Alert and oriented Psych: Pleasant. Cooperative     LOS: 1 day   Vina Dasen ,NP 06/14/2024, 1:44 PM   -------------------------------------------------------------------------------  I have taken a history, reviewed the chart and examined the patient. I performed a substantive portion of this encounter, including complete performance of at least one of the key components, in conjunction with the APP. I agree with the APP's note, impression and recommendations  Patient feeling better today.  Liver enzymes have improved rapidly, with AST going from 3700 on January 6 to 479 today.  T. bili has trended up, as expected. Blood cultures positive for Klebsiella, secondary to urinary source.  With the patient's very rapid improvement in aminotransferase values, and the revelation that patient has urosepsis, it is much more likely that the patient's liver enzymes were secondary to hepatic hypoperfusion in the setting of sepsis. Although a drug-induced liver injury and viral syndrome are also possible, I think hypoperfusive liver injury is by far the most likely explanation.  I explained this to the patient and to his wife over the phone.  I expect that his aminotransferases will continue to downtrend, with AST being more rapidly clear than ALT, and that his T. bili will probably continue to rise, and will remain elevated for weeks or months.  Will await viral serologies, but anticipate them to be negative.  ANA positive, but smooth muscle negative.  Low suspicion for clinical relevance of positive ANA.  Would recommend that  patient's liver enzymes continue to be trended over the next weeks or months to ensure that they return to near normal (he does have underlying metabolic liver disease, so he may have persistent mild elevation of aminotransferases).  I advised the patient to exercise caution if he is prescribed Augmentin  or doxycycline  in the future, as it remains a remote possibility that these drugs may have participated in his liver injury.  GI will sign off at this time.  Please reconsult if the patient's liver enzymes worsen or if there are additional concerns.  Cairo Lingenfelter E. Stacia, MD [facility]     "

## 2024-06-14 NOTE — Progress Notes (Signed)
" °   06/14/24 1400  Spiritual Encounters  Type of Visit Initial  Care provided to: Patient  Referral source Nurse (RN/NT/LPN)  Reason for visit Advance directives  OnCall Visit No    Chaplain responded to consult request for Advance Directives. The patient stated that he is already familiar with this document and declined AD education. Chaplain left the document in the patient's room for review. Chaplains remain available if needed.   M.Kubra Susanna Kerry Resident [phone]    "

## 2024-06-14 NOTE — TOC Initial Note (Signed)
 Transition of Care [facility]) - Initial/Assessment Note    Patient Details  Name: Larry Fowler MRN: [id number] Date of Birth: 01-17-1971  Transition of Care [facility]) CM/SW Contact:    Lendia Dais, LCSWA Phone Number: 06/14/2024, 10:11 AM  Clinical Narrative: CSW met with the pt and pt's spouse Dusty at bedside and introduced self and role.  Pt is from home w/ spouse, indep w/ ADL's, no DME, and drives. Pt states that they take meds as prescribed and have seen PCP in the last year. Pt does not have a legal HCPOA but was agreeable to spiritual consult for advance directives. Consult placed.   Pt states that they work part time and are able to afford all basic needs and reports no SDOH concerns. Pt has no hx of HH/STR.   CSW inquired if the pt had any health insurance and the pt declined. CSW gained verbal permission to contact financial navigator to initiate a screening for medicaid. CSW sent a secure chat to financial navigator.  No current TOC needs at this time. Please place [facility] consult for any further needs.                Expected Discharge Plan: Home/Self Care Barriers to Discharge: Continued Medical Work up   Patient Goals and CMS Choice Patient states their goals for this hospitalization and ongoing recovery are:: Continue to live life   Choice offered to / list presented to : NA      Expected Discharge Plan and Services In-house Referral: Clinical Social Work, Orthoptist, Artist     Living arrangements for the past 2 months: Single Family Home                                      Prior Living Arrangements/Services Living arrangements for the past 2 months: Single Family Home Lives with:: Spouse Patient language and need for interpreter reviewed:: No        Need for Family Participation in Patient Care: No (Comment) Care giver support system in place?: No (comment) Current home services: DME Criminal Activity/Legal Involvement Pertinent to  Current Situation/Hospitalization: No - Comment as needed  Activities of Daily Living   ADL Screening (condition at time of admission) Is the patient deaf or have difficulty hearing?: No Does the patient have difficulty seeing, even when wearing glasses/contacts?: No Does the patient have difficulty concentrating, remembering, or making decisions?: No  Permission Sought/Granted Permission sought to share information with : Family Supports Permission granted to share information with : Yes, Verbal Permission Granted  Share Information with NAME: Dusty Colarusso     Permission granted to share info w Relationship: Spouse  Permission granted to share info w Contact Information: [phone]  Emotional Assessment Appearance:: Appears stated age Attitude/Demeanor/Rapport: Guarded Affect (typically observed): Agitated Orientation: : Oriented to Self, Oriented to Situation, Oriented to Place, Oriented to  Time Alcohol / Substance Use: Not Applicable Psych Involvement: No (comment)  Admission diagnosis:  Acute hepatitis [B17.9] Elevated LFTs [R79.89] Patient Active Problem List   Diagnosis Date Noted   Elevated LFTs 06/13/2024   Sepsis (HCC) 06/13/2024   Acute hepatitis 06/13/2024   Yeast dermatitis 12/06/2023   Hidradenitis suppurativa 11/05/2023   Limited mobility 11/05/2023   Cellulitis 11/05/2023   Hypocoagulable state 11/02/2023   History of pulmonary embolism 11/02/2023   BMI 60.0-69.9, adult (HCC) 11/02/2023   Current use of long term anticoagulation 11/02/2023  Right leg pain 09/27/2022   Right shoulder pain 09/27/2022   History of renal stone 07/07/2022   URI (upper respiratory infection) 07/07/2022   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   DVT (deep venous thrombosis) (HCC) 06/26/2022   Recurrent boils 03/01/2022   Perineal abscess, superficial 03/01/2022   Abnormal gait due to muscle weakness 12/29/2019   Rupture of right quadriceps tendon 11/13/2018    Post-operative pain    Congestion of both ears    Pruritus    Episode of recurrent major depressive disorder    Labile blood glucose    Sleep disturbance    Labile blood pressure    Uncontrolled type 2 diabetes mellitus with hyperglycemia (HCC)    Otitis media    Anxiety and depression    Super-super obese (HCC)    Postoperative pain    Postoperative anemia due to acute blood loss 07/23/2018    Class: Acute   Rupture of left quadriceps muscle    Quadriceps tendon rupture 07/20/2018   Medication management 07/03/2018   Disorder of tendon of left biceps 12/05/2017   Hypogonadism in male 10/25/2017   Diabetes [facility])    Encounter for well adult exam with abnormal findings 03/21/2017   Morbid obesity (HCC) 03/21/2017   OSA (obstructive sleep apnea) 03/21/2017   Chronic bilateral low back pain without sciatica 03/21/2017   HTN (hypertension) 03/21/2017   Fatigue 03/21/2017   Left inguinal hernia 03/21/2017   Rash 03/21/2017   History of cocaine use    Cannabis abuse    Renal stone    Anal abscess, ? 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 [place] [address] Phone: [phone] Fax: [phone]  [id number] - [place], [place] - [address] AT [facility] [address] [place] [address] Phone: [phone] Fax: [phone]  [facility] [place] [facility] [address] [place], [place] - [address] [place] [address] Phone: [phone] Fax: [phone]  [id number] - [place], [place] - [address] AT [facility] [address] [facility] [address] [place] [address] Phone: [phone] Fax: [phone]  [facility] - [facility] [address] [place] [address] Phone: [phone] Fax: [phone]  [facility] [id number] [place], [place] - [address] [place] [address] Phone: [phone] Fax: [phone]     Social Drivers of Health (SDOH) Social History: SDOH Screenings   Food Insecurity: Patient Declined (11/01/2023)  Housing: High Risk (11/01/2023)  Transportation Needs: Patient Declined (11/01/2023)  Depression (PHQ2-9): Low Risk (06/11/2024)  Financial Resource Strain: Patient Declined (11/01/2023)  Physical Activity: Insufficiently Active (11/01/2023)  Social Connections: Unknown (11/01/2023)  Stress: Patient Declined (11/01/2023)  Tobacco Use: Medium Risk (06/12/2024)   SDOH Interventions:     Readmission Risk Interventions    06/29/2022   11:55 AM  Readmission Risk Prevention Plan  Post Dischage Appt Complete  Medication Screening Complete  Transportation Screening Complete

## 2024-06-14 NOTE — Plan of Care (Signed)
   Problem: Coping: Goal: Ability to adjust to condition or change in health will improve Outcome: Progressing

## 2024-06-14 NOTE — Progress Notes (Signed)
" °   06/14/24 1438  SDOH Interventions  Housing Interventions Inpatient TOC;Patient Declined     CSW inquired about SDOH concerns of housing. Pt stated they were able to afford the cost of housing. No resources offered.  [place], [place] [phone] "

## 2024-06-15 LAB — GLUCOSE, CAPILLARY
Glucose-Capillary: 298 mg/dL — ABNORMAL HIGH (ref 70–99)
Glucose-Capillary: 347 mg/dL — ABNORMAL HIGH (ref 70–99)
Glucose-Capillary: 161 mg/dL — ABNORMAL HIGH (ref 70–99)
Glucose-Capillary: 143 mg/dL — ABNORMAL HIGH (ref 70–99)

## 2024-06-15 LAB — COMPREHENSIVE METABOLIC PANEL WITH GFR
ALT: 1205 U/L — ABNORMAL HIGH (ref 0–44)
AST: 226 U/L — ABNORMAL HIGH (ref 15–41)
Albumin: 2.9 g/dL — ABNORMAL LOW (ref 3.5–5.0)
Alkaline Phosphatase: 229 U/L — ABNORMAL HIGH (ref 38–126)
Anion gap: 10 (ref 5–15)
BUN: 11 mg/dL (ref 6–20)
CO2: 25 mmol/L (ref 22–32)
Calcium: 8.5 mg/dL — ABNORMAL LOW (ref 8.9–10.3)
Chloride: 96 mmol/L — ABNORMAL LOW (ref 98–111)
Creatinine, Ser: 0.6 mg/dL — ABNORMAL LOW (ref 0.61–1.24)
GFR, Estimated: >60 mL/min
Glucose, Bld: 305 mg/dL — ABNORMAL HIGH (ref 70–99)
Potassium: 3.7 mmol/L (ref 3.5–5.1)
Sodium: 130 mmol/L — ABNORMAL LOW (ref 135–145)
Total Bilirubin: 10.8 mg/dL — ABNORMAL HIGH (ref 0.0–1.2)
Total Protein: 5.6 g/dL — ABNORMAL LOW (ref 6.5–8.1)

## 2024-06-15 LAB — CULTURE, BLOOD (ROUTINE X 2): Special Requests: ADEQUATE

## 2024-06-15 LAB — URINE CULTURE: Culture: >=100000 — AB

## 2024-06-15 LAB — CMV IGM: CMV IgM: <30 [AU]/ml (ref 0.0–29.9)

## 2024-06-15 LAB — CMV ANTIBODY, IGG (EIA): CMV Ab - IgG: <0.6 U/mL (ref 0.00–0.59)

## 2024-06-15 LAB — EPSTEIN-BARR VIRUS (EBV) ANTIBODY PROFILE
EBV NA IgG: >600 U/mL — ABNORMAL HIGH (ref 0.0–17.9)
EBV VCA IgG: >600 U/mL — ABNORMAL HIGH (ref 0.0–17.9)
EBV VCA IgM: <36 U/mL (ref 0.0–35.9)

## 2024-06-15 MED ORDER — SODIUM CHLORIDE 0.9 % IV SOLN: INTRAVENOUS | Status: DC

## 2024-06-15 MED ORDER — PHENAZOPYRIDINE HCL 200 MG PO TABS
200.0000 mg | ORAL_TABLET | Freq: Three times a day (TID) | ORAL | Status: DC
  Administered 2024-06-15 – 2024-06-17 (×5): 200 mg via ORAL
  Filled 2024-06-15 (×8): qty 1

## 2024-06-15 MED ORDER — CEFADROXIL 500 MG PO CAPS
1000.0000 mg | ORAL_CAPSULE | Freq: Two times a day (BID) | ORAL | Status: DC
  Administered 2024-06-15 – 2024-06-17 (×5): 1000 mg via ORAL
  Filled 2024-06-15 (×5): qty 2

## 2024-06-15 MED ORDER — GERHARDT'S BUTT CREAM
TOPICAL_CREAM | Freq: Three times a day (TID) | CUTANEOUS | Status: DC
  Filled 2024-06-15: qty 60

## 2024-06-15 NOTE — Evaluation (Signed)
 Physical Therapy Evaluation & Discharge Patient Details Name: Larry Fowler MRN: [id number] DOB: 31-Jan-1971 Today's Date: 06/15/2024  History of Present Illness  53 y.o. male admitted 06/12/24 with weakness, dehydration, poor oral intake; recent flu dx 06/01/24. Workup for sepsis secondary to UTI, concern for pyelonephritis. PMH includes uncontrolled DM2, PE/DVT on Eliquis , morbid obesity, OSA.   Clinical Impression  Patient evaluated by Physical Therapy with no further acute PT needs identified. PTA, pt mod indep with RW, works part-time from home, lives with wife. Today, pt declines OOB mobility, reports no concerns as he has been getting up with staff and wife. Session focused on education, including pulmonary hygiene and activity recommendations; HEP and incentive spirometer provided, pt pulling ~1223mL, productive cough throughout session. Pt declines need for continued acute PT services; all education has been completed and the patient has no further questions. Acute PT is signing off. Thank you for this referral.      If plan is discharge home, recommend the following: A little help with bathing/dressing/bathroom;Assistance with cooking/housework;Assist for transportation   Can travel by private vehicle    Yes    Equipment Recommendations None recommended by PT  Recommendations for Other Services   Mobility Specialist    Functional Status Assessment Patient has had a recent decline in their functional status and demonstrates the ability to make significant improvements in function in a reasonable and predictable amount of time.     Precautions / Restrictions Precautions Precautions: Fall Recall of Precautions/Restrictions: Intact Restrictions Weight Bearing Restrictions Per Provider Order: No      Mobility  Bed Mobility Overal bed mobility: Modified Independent             General bed mobility comments: pt repositioning self in bed with use of bed rails, HOB  elevated; declines to sit EOB    Transfers                   General transfer comment: pt declines; reports he has been getting up with staff or wife assist using his RW, has no concerns    Ambulation/Gait               General Gait Details: pt declines  Stairs            Wheelchair Mobility     Tilt Bed    Modified Rankin (Stroke Patients Only)       Balance                                             Pertinent Vitals/Pain Pain Assessment Pain Assessment: Faces Faces Pain Scale: Hurts little more Pain Location: generalized Pain Descriptors / Indicators: Discomfort Pain Intervention(s): Monitored during session    Home Living Family/patient expects to be discharged to:: Private residence Living Arrangements: Spouse/significant other Available Help at Discharge: Family;Available PRN/intermittently Type of Home: House Home Access: Stairs to enter       Home Layout: One level Home Equipment: [employer] (2 wheels) Additional Comments: lives with wife who works    Prior Function Prior Level of Function : Independent/Modified Independent;Working/employed;Driving             Mobility Comments: mod indep with bariatric RW (has used since bilateral knee injury 2020 with stay at [facility]), 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 ADLs Comments: mod indep ADLs (uses bidet; wife assists with pericare  while admitted); 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[employer]), mod indep household tasks     Extremity/Trunk Assessment   Upper Extremity Assessment Upper Extremity Assessment: Overall WFL for tasks assessed    Lower Extremity Assessment Lower Extremity Assessment: Generalized weakness       Communication   Communication Communication: No apparent difficulties    Cognition Arousal: Alert Behavior During Therapy: WFL for tasks assessed/performed   PT - Cognitive impairments: No apparent impairments                          Following commands: Intact       Cueing       General Comments General comments (skin integrity, edema, etc.): educ re: role of acute PT, POC, activity recommendations, pulmonary hygiene, importance of upright and OOB mobility, potential d/c needs. pt with productive cough during sessions, clearing secretions well. incentive spirometer provided, pt pulling ~1269mL for multiple reps; HEP provided for generalized BLE strengthening. pt expresses concerns if legs have never been as strong since his knee injuries in 2020, discussed OPPT to address this (pt reports uninsured, educ on [facility], pt reports he'd rather pay out of pocket than drive to [place]). pt requests bariatric-sized BSC while admitted, RN reports charge RN working on this already. pt declines OOB mobility and declines need for follow up PT services while admitted    Exercises Other Exercises Other Exercises: incentive spirometer x8 Other Exercises: [employer] HEP handout provided for generalized BLE strengthening - supine SLR, supine hip ABD, repeated sit<>stands, standing calf raises, standing hip abd&ext   Assessment/Plan    PT Assessment Patient does not need any further PT services  PT Problem List         PT Treatment Interventions      PT Goals (Current goals can be found in the Care Plan section)  Acute Rehab PT Goals PT Goal Formulation: All assessment and education complete, DC therapy    Frequency       Co-evaluation               AM-PAC PT 6 Clicks Mobility  Outcome Measure Help needed turning from your back to your side while in a flat bed without using bedrails?: A Little Help needed moving from lying on your back to sitting on the side of a flat bed without using bedrails?: A Little Help needed moving to and from a bed to a chair (including a wheelchair)?: A Little Help needed standing up from a chair using your arms (e.g., wheelchair or bedside chair)?: A Little Help needed to walk  in hospital room?: A Little Help needed climbing 3-5 steps with a railing? : A Little 6 Click Score: 18    End of Session   Activity Tolerance: Patient limited by fatigue Patient left: in bed;with call bell/phone within reach Nurse Communication: Mobility status PT Visit Diagnosis: Other abnormalities of gait and mobility (R26.89)    Time: 9247-9185 PT Time Calculation (min) (ACUTE ONLY): 22 min   Charges:   PT Evaluation $PT Eval Low Complexity: 1 Low   PT General Charges $$ ACUTE PT VISIT: 1 Visit    Darice Almas, PT, DPT Acute Rehabilitation Services  Personal: Secure Chat Rehab Office: [phone]  Darice LITTIE Almas 06/15/2024, 9:41 AM

## 2024-06-15 NOTE — Progress Notes (Signed)
 " PROGRESS NOTE  Larry Fowler  FMW:[id number] DOB: 08-Dec-1970 DOA: 06/12/2024 PCP: Alvia Corean CROME, FNP   Brief Narrative: Larry Fowler is a 54 y.o. male with medical history significant of diabetes type 2, paroxysmal A-fib, BPH, morbid obesity who presented with complaint of being weak, dehydrated, poor oral intake.  He was recently diagnosed with flu on December 26 and he dad developed fever, cough, body aches, sore throat.  Flu swab was positive for that time.  He reported persistent fever.  Lab work showed elevated liver enzymes, UA was suspicious for UTI, lactic acid was elevated.  Blood cultures showed  Klebsiella in both sets.  Sepsis physiology has  improved.  Liver enzymes improving. Possible discharge home tomorrow.  Assessment & Plan:  Principal Problem:   Sepsis (HCC) Active Problems:   Morbid obesity (HCC)   Renal stone   OSA (obstructive sleep apnea)   Diabetes (HCC)   Uncontrolled type 2 diabetes mellitus with hyperglycemia (HCC)   Saddle pulmonary embolus (HCC)   History of pulmonary embolism   Elevated LFTs   Acute hepatitis   Shock liver   Pyelonephritis   Bacteremia due to Klebsiella pneumoniae   Severe sepsis secondary to UTI/pyelonephritis/Klebsiella bacteremia: Presented with generalized weakness, mild -grade fever, sinus tachycardia.  Lab work showed elevated lactate.  UA suspicious for UTI.  CT abdomen/pelvis shows possible right-sided pyelonephritis with nonobstructing stone. Blood cultures showing Klebsiella.  Likely source is urine.  Currently on ceftriaxone .  Klebsiella is pansensitive.  Will likely change to cefadroxil  today.  Afebrile this morning.  Lactic acidosis resolved   Elevated liver enzymes: Unclear etiology.  Likely from sepsis or viral versus drug-induced.  ASMA negative ,ANA positive.  Negative CMV/pending EBV.  Hepatitis panel negative.  Right upper quadrant ultrasound does not show any signs of cholecystitis or obstruction.   Tylenol  level less than 10.  Liver enzymes improving but bilirubin is remaining steady in the range of 10.   Uncontrolled diabetes type 2: A1c of 13.3.  Only takes pioglitazone  at home.  Diabetic coordinator consulted.  Continue current insulin  regimen.  Monitor blood sugars.  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  Currently on room air.   Hyponatremia: Sodium in the range of 120s on presentation this is most likely from pseudohyponatremia from hyperglycemia.  Should improve with improvement of blood glucose.  Improving.  Continue gentle IV fluid today.   Morbid obesity: BMI 56   OSA: Does not use CPAP  Deconditioning: PT consulted, outpatient follow-up recommended          DVT prophylaxis: apixaban  (ELIQUIS ) tablet 5 mg     Code Status: Prior  Family Communication: Wife at bedside on 1/9  Patient status:Inpatient  Patient is from :home  Anticipated discharge un:ynfz  Estimated DC date:1-2 days   Consultants: GI  Procedures:None  Antimicrobials:  Anti-infectives (From admission, onward)    Start     Dose/Rate Route Frequency Ordered Stop   06/13/24 2230  cefTRIAXone  (ROCEPHIN ) 2 g in sodium chloride  0.9 % 100 mL IVPB        2 g 200 mL/hr over 30 Minutes Intravenous Every 24 hours 06/13/24 2144     06/13/24 0800  piperacillin -tazobactam (ZOSYN ) IVPB 3.375 g  Status:  Discontinued        3.375 g 12.5 mL/hr over 240 Minutes Intravenous Every 8 hours 06/13/24 0748 06/13/24  2144   06/13/24 0445  piperacillin -tazobactam (ZOSYN ) IVPB 3.375 g        3.375 g 100 mL/hr over 30 Minutes Intravenous  Once 06/13/24 0436 06/13/24 9370       Subjective: Patient seen and examined at bedside today.  Hemodynamically stable.  Overall comfortable.  Lying in bed.  Cough better today.  Complains of some irritation around the anal area and also burning of urine.  Denies new  complaints  Objective: Vitals:   06/14/24 1953 06/14/24 2306 06/15/24 0254 06/15/24 0744  BP: (!) 110/46 119/72 113/64 118/69  Pulse: 76 83 84 86  Resp: 19 20 19 18   Temp: 98.2 F (36.8 C) 98.7 F (37.1 C) 98 F (36.7 C) 98.3 F (36.8 C)  TempSrc: Oral Oral Oral   SpO2: 96% 97% 96% 97%    Intake/Output Summary (Last 24 hours) at 06/15/2024 1111 Last data filed at 06/15/2024 0750 Gross per 24 hour  Intake 900 ml  Output 1700 ml  Net -800 ml   There were no vitals filed for this visit.  Examination:  General exam: Overall comfortable, not in distress,morbidly obese HEENT: PERRL Respiratory system:  no wheezes or crackles  Cardiovascular system: S1 & S2 heard, RRR.  Gastrointestinal system: Abdomen is nondistended, soft and nontender. Central nervous system: Alert and oriented Extremities: No edema, no clubbing ,no cyanosis Skin: No rashes, no ulcers,no icterus     Data Reviewed: I have personally reviewed following labs and imaging studies  CBC: Recent Labs  Lab 06/12/24 1455 06/14/24 0442  WBC 7.9 7.8  NEUTROABS 6.2  --   HGB 15.0 13.5  HCT 44.8 39.5  MCV 92.4 89.8  PLT 132* 118*   Basic Metabolic Panel: Recent Labs  Lab 06/12/24 1455 06/13/24 0800 06/14/24 0442 06/15/24 0245  NA 121* 122* 125* 130*  K 4.4 4.4 3.8 3.7  CL 85* 87* 91* 96*  CO2 19* 21* 24 25  GLUCOSE 468* 447* 331* 305*  BUN 13 16 16 11   CREATININE 0.65 0.55* 0.62 0.60*  CALCIUM 9.0 8.5* 8.5* 8.5*     Recent Results (from the past 240 hours)  Culture, blood (routine x 2)     Status: Abnormal (Preliminary result)   Collection Time: 06/13/24  4:34 AM   Specimen: BLOOD  Result Value Ref Range Status   Specimen Description BLOOD SITE NOT SPECIFIED  Final   Special Requests   Final    BOTTLES DRAWN AEROBIC AND ANAEROBIC Blood Culture results may not be optimal due to an inadequate volume of blood received in culture bottles   Culture  Setup Time   Final    GRAM NEGATIVE RODS IN BOTH  AEROBIC AND ANAEROBIC BOTTLES CRITICAL RESULT CALLED TO, READ BACK BY AND VERIFIED WITH: MAYA EMERSON GLATTER [id number] @ 2140 FH Performed at [facility], [address], [place], [place] [address]    Culture KLEBSIELLA PNEUMONIAE (A)  Final   Report Status PENDING  Incomplete   Organism ID, Bacteria KLEBSIELLA PNEUMONIAE  Final      Susceptibility   Klebsiella pneumoniae - MIC*    AMPICILLIN >=32 RESISTANT Resistant     CEFAZOLIN  (NON-URINE) 2 SENSITIVE Sensitive     CEFEPIME <=0.12 SENSITIVE Sensitive     ERTAPENEM <=0.12 SENSITIVE Sensitive     CEFTRIAXONE  <=0.25 SENSITIVE Sensitive     CIPROFLOXACIN <=0.06 SENSITIVE Sensitive     GENTAMICIN <=1 SENSITIVE Sensitive     MEROPENEM <=0.25 SENSITIVE Sensitive     TRIMETH/SULFA <=  20 SENSITIVE Sensitive     AMPICILLIN/SULBACTAM 4 SENSITIVE Sensitive     PIP/TAZO Value in next row Sensitive      <=4 SENSITIVEThis is a modified FDA-approved test that has been validated and its performance characteristics determined by the reporting laboratory.  This laboratory is certified under the Clinical Laboratory Improvement Amendments CLIA as qualified to perform high complexity clinical laboratory testing.    * KLEBSIELLA PNEUMONIAE  Blood Culture ID Panel (Reflexed)     Status: Abnormal   Collection Time: 06/13/24  4:34 AM  Result Value Ref Range Status   Enterococcus faecalis NOT DETECTED NOT DETECTED Final   Enterococcus Faecium NOT DETECTED NOT DETECTED Final   Listeria monocytogenes NOT DETECTED NOT DETECTED Final   Staphylococcus species NOT DETECTED NOT DETECTED Final   Staphylococcus aureus (BCID) NOT DETECTED NOT DETECTED Final   Staphylococcus epidermidis NOT DETECTED NOT DETECTED Final   Staphylococcus lugdunensis NOT DETECTED NOT DETECTED Final   Streptococcus species NOT DETECTED NOT DETECTED Final   Streptococcus agalactiae NOT DETECTED NOT DETECTED Final   Streptococcus pneumoniae NOT DETECTED NOT DETECTED Final   Streptococcus  pyogenes NOT DETECTED NOT DETECTED Final   A.calcoaceticus-baumannii NOT DETECTED NOT DETECTED Final   Bacteroides fragilis NOT DETECTED NOT DETECTED Final   Enterobacterales DETECTED (A) NOT DETECTED Final    Comment: Enterobacterales represent a large order of gram negative bacteria, not a single organism. CRITICAL RESULT CALLED TO, READ BACK BY AND VERIFIED WITH: PHARMD EMERSON GLATTER [id number] @ 2140 FH    Enterobacter cloacae complex NOT DETECTED NOT DETECTED Final   Escherichia coli NOT DETECTED NOT DETECTED Final   Klebsiella aerogenes NOT DETECTED NOT DETECTED Final   Klebsiella oxytoca NOT DETECTED NOT DETECTED Final   Klebsiella pneumoniae DETECTED (A) NOT DETECTED Final    Comment: CRITICAL RESULT CALLED TO, READ BACK BY AND VERIFIED WITH: PHARMD EMERSON GLATTER [id number] @ 2140 FH    Proteus species NOT DETECTED NOT DETECTED Final   Salmonella species NOT DETECTED NOT DETECTED Final   Serratia marcescens NOT DETECTED NOT DETECTED Final   Haemophilus influenzae NOT DETECTED NOT DETECTED Final   Neisseria meningitidis NOT DETECTED NOT DETECTED Final   Pseudomonas aeruginosa NOT DETECTED NOT DETECTED Final   Stenotrophomonas maltophilia NOT DETECTED NOT DETECTED Final   Candida albicans NOT DETECTED NOT DETECTED Final   Candida auris NOT DETECTED NOT DETECTED Final   Candida glabrata NOT DETECTED NOT DETECTED Final   Candida krusei NOT DETECTED NOT DETECTED Final   Candida parapsilosis NOT DETECTED NOT DETECTED Final   Candida tropicalis NOT DETECTED NOT DETECTED Final   Cryptococcus neoformans/gattii NOT DETECTED NOT DETECTED Final   CTX-M ESBL NOT DETECTED NOT DETECTED Final   Carbapenem resistance IMP NOT DETECTED NOT DETECTED Final   Carbapenem resistance KPC NOT DETECTED NOT DETECTED Final   Carbapenem resistance NDM NOT DETECTED NOT DETECTED Final   Carbapenem resist OXA 48 LIKE NOT DETECTED NOT DETECTED Final   Carbapenem resistance VIM NOT DETECTED NOT DETECTED Final     Comment: Performed at [facility], [address], [place], [place] [address]  Culture, blood (routine x 2)     Status: Abnormal (Preliminary result)   Collection Time: 06/13/24  4:39 AM   Specimen: BLOOD  Result Value Ref Range Status   Specimen Description BLOOD SITE NOT SPECIFIED  Final   Special Requests   Final    BOTTLES DRAWN AEROBIC AND ANAEROBIC Blood Culture adequate volume   Culture  Setup Time  Final    GRAM NEGATIVE RODS IN BOTH AEROBIC AND ANAEROBIC BOTTLES CRITICAL VALUE NOTED.  VALUE IS CONSISTENT WITH PREVIOUSLY REPORTED AND CALLED VALUE.    Culture (A)  Final    KLEBSIELLA PNEUMONIAE SUSCEPTIBILITIES PERFORMED ON PREVIOUS CULTURE WITHIN THE LAST 5 DAYS. Performed at [facility], [address], [place], [place] [address]    Report Status PENDING  Incomplete  Urine Culture     Status: Abnormal   Collection Time: 06/13/24  5:59 AM   Specimen: Urine, Clean Catch  Result Value Ref Range Status   Specimen Description URINE, CLEAN CATCH  Final   Special Requests   Final    NONE Performed at [facility], [address] [place], [place] [address]    Culture >=100,000 COLONIES/mL KLEBSIELLA PNEUMONIAE (A)  Final   Report Status 06/15/2024 FINAL  Final   Organism ID, Bacteria KLEBSIELLA PNEUMONIAE (A)  Final      Susceptibility   Klebsiella pneumoniae - MIC*    AMPICILLIN >=32 RESISTANT Resistant     CEFAZOLIN  (URINE) Value in next row Sensitive      2 SENSITIVEThis is a modified FDA-approved test that has been validated and its performance characteristics determined by the reporting laboratory.  This laboratory is certified under the Clinical Laboratory Improvement Amendments CLIA as qualified to perform high complexity clinical laboratory testing.    CEFEPIME Value in next row Sensitive      2 SENSITIVEThis is a modified FDA-approved test that has been validated and its performance characteristics determined by the reporting laboratory.  This  laboratory is certified under the Clinical Laboratory Improvement Amendments CLIA as qualified to perform high complexity clinical laboratory testing.    ERTAPENEM Value in next row Sensitive      2 SENSITIVEThis is a modified FDA-approved test that has been validated and its performance characteristics determined by the reporting laboratory.  This laboratory is certified under the Clinical Laboratory Improvement Amendments CLIA as qualified to perform high complexity clinical laboratory testing.    CEFTRIAXONE  Value in next row Sensitive      2 SENSITIVEThis is a modified FDA-approved test that has been validated and its performance characteristics determined by the reporting laboratory.  This laboratory is certified under the Clinical Laboratory Improvement Amendments CLIA as qualified to perform high complexity clinical laboratory testing.    CIPROFLOXACIN Value in next row Sensitive      2 SENSITIVEThis is a modified FDA-approved test that has been validated and its performance characteristics determined by the reporting laboratory.  This laboratory is certified under the Clinical Laboratory Improvement Amendments CLIA as qualified to perform high complexity clinical laboratory testing.    GENTAMICIN Value in next row Sensitive      2 SENSITIVEThis is a modified FDA-approved test that has been validated and its performance characteristics determined by the reporting laboratory.  This laboratory is certified under the Clinical Laboratory Improvement Amendments CLIA as qualified to perform high complexity clinical laboratory testing.    NITROFURANTOIN Value in next row Intermediate      2 SENSITIVEThis is a modified FDA-approved test that has been validated and its performance characteristics determined by the reporting laboratory.  This laboratory is certified under the Clinical Laboratory Improvement Amendments CLIA as qualified to perform high complexity clinical laboratory testing.    TRIMETH/SULFA  Value in next row Sensitive      2 SENSITIVEThis is a modified FDA-approved test that has been validated and its performance characteristics determined by the  reporting laboratory.  This laboratory is certified under the Clinical Laboratory Improvement Amendments CLIA as qualified to perform high complexity clinical laboratory testing.    AMPICILLIN/SULBACTAM Value in next row Sensitive      2 SENSITIVEThis is a modified FDA-approved test that has been validated and its performance characteristics determined by the reporting laboratory.  This laboratory is certified under the Clinical Laboratory Improvement Amendments CLIA as qualified to perform high complexity clinical laboratory testing.    PIP/TAZO Value in next row Sensitive      <=4 SENSITIVEThis is a modified FDA-approved test that has been validated and its performance characteristics determined by the reporting laboratory.  This laboratory is certified under the Clinical Laboratory Improvement Amendments CLIA as qualified to perform high complexity clinical laboratory testing.    MEROPENEM Value in next row Sensitive      <=4 SENSITIVEThis is a modified FDA-approved test that has been validated and its performance characteristics determined by the reporting laboratory.  This laboratory is certified under the Clinical Laboratory Improvement Amendments CLIA as qualified to perform high complexity clinical laboratory testing.    * >=100,000 COLONIES/mL KLEBSIELLA PNEUMONIAE     Radiology Studies: No results found.   Scheduled Meds:  apixaban   5 mg Oral BID   chlorpheniramine-HYDROcodone   5 mL Oral Q12H   Gerhardt's butt cream   Topical TID   guaiFENesin   10 mL Oral Q6H   insulin  aspart  0-20 Units Subcutaneous TID WC   insulin  aspart  0-5 Units Subcutaneous QHS   insulin  aspart  10 Units Subcutaneous TID WC   insulin  glargine  30 Units Subcutaneous BID   phenazopyridine   200 mg Oral TID WC   tamsulosin   0.4 mg Oral Daily   Continuous  Infusions:  sodium chloride      cefTRIAXone  (ROCEPHIN )  IV 2 g (06/14/24 2152)     LOS: 2 days   Ivonne Mustache, MD [facility] P1/02/2025, 11:11 AM  PROGRESS NOTE  Larry Fowler  FMW:[id number] DOB: [birth year] DOA: 06/12/2024 PCP: Alvia Corean CROME, FNP   Brief Narrative:   Assessment & Plan:  Principal Problem:   Sepsis (HCC) Active Problems:   Morbid obesity (HCC)   Renal stone   OSA (obstructive sleep apnea)   Diabetes (HCC)   Uncontrolled type 2 diabetes mellitus with hyperglycemia (HCC)   Saddle pulmonary embolus (HCC)   History of pulmonary embolism   Elevated LFTs   Acute hepatitis   Shock liver   Pyelonephritis   Bacteremia due to Klebsiella pneumoniae           DVT prophylaxis: apixaban  (ELIQUIS ) tablet 5 mg     Code Status: Prior  Family Communication:   Patient status:  Patient is from :  Anticipated discharge to:  Estimated DC date:   Consultants:   Procedures:  Antimicrobials:  Anti-infectives (From admission, onward)    Start     Dose/Rate Route Frequency Ordered Stop   06/13/24 2230  cefTRIAXone  (ROCEPHIN ) 2 g in sodium chloride  0.9 % 100 mL IVPB        2 g 200 mL/hr over 30 Minutes Intravenous Every 24 hours 06/13/24 2144     06/13/24 0800  piperacillin -tazobactam (ZOSYN ) IVPB 3.375 g  Status:  Discontinued        3.375 g 12.5 mL/hr over 240 Minutes Intravenous Every 8 hours 06/13/24 0748 06/13/24 2144   06/13/24 0445  piperacillin -tazobactam (ZOSYN ) IVPB 3.375 g        3.375 g 100 mL/hr over 30  Minutes Intravenous  Once 06/13/24 0436 06/13/24 0629       Subjective:   Objective: Vitals:   06/14/24 1953 06/14/24 2306 06/15/24 0254 06/15/24 0744  BP: (!) 110/46 119/72 113/64 118/69  Pulse: 76 83 84 86  Resp: 19 20 19 18   Temp: 98.2 F (36.8 C) 98.7 F (37.1 C) 98 F (36.7 C) 98.3 F (36.8 C)  TempSrc: Oral Oral Oral   SpO2: 96% 97% 96% 97%    Intake/Output Summary (Last 24 hours) at 06/15/2024  1111 Last data filed at 06/15/2024 0750 Gross per 24 hour  Intake 900 ml  Output 1700 ml  Net -800 ml   There were no vitals filed for this visit.  Examination:  General exam: Overall comfortable, not in distress HEENT: PERRL Respiratory system:  no wheezes or crackles  Cardiovascular system: S1 & S2 heard, RRR.  Gastrointestinal system: Abdomen is nondistended, soft and nontender. Central nervous system: Alert and oriented Extremities: No edema, no clubbing ,no cyanosis Skin: No rashes, no ulcers,no icterus     Data Reviewed: I have personally reviewed following labs and imaging studies  CBC: Recent Labs  Lab 06/12/24 1455 06/14/24 0442  WBC 7.9 7.8  NEUTROABS 6.2  --   HGB 15.0 13.5  HCT 44.8 39.5  MCV 92.4 89.8  PLT 132* 118*   Basic Metabolic Panel: Recent Labs  Lab 06/12/24 1455 06/13/24 0800 06/14/24 0442 06/15/24 0245  NA 121* 122* 125* 130*  K 4.4 4.4 3.8 3.7  CL 85* 87* 91* 96*  CO2 19* 21* 24 25  GLUCOSE 468* 447* 331* 305*  BUN 13 16 16 11   CREATININE 0.65 0.55* 0.62 0.60*  CALCIUM 9.0 8.5* 8.5* 8.5*     Recent Results (from the past 240 hours)  Culture, blood (routine x 2)     Status: Abnormal (Preliminary result)   Collection Time: 06/13/24  4:34 AM   Specimen: BLOOD  Result Value Ref Range Status   Specimen Description BLOOD SITE NOT SPECIFIED  Final   Special Requests   Final    BOTTLES DRAWN AEROBIC AND ANAEROBIC Blood Culture results may not be optimal due to an inadequate volume of blood received in culture bottles   Culture  Setup Time   Final    GRAM NEGATIVE RODS IN BOTH AEROBIC AND ANAEROBIC BOTTLES CRITICAL RESULT CALLED TO, READ BACK BY AND VERIFIED WITH: MAYA EMERSON GLATTER [id number] @ 2140 FH Performed at [facility], [address] [place], [place] [address]    Culture KLEBSIELLA PNEUMONIAE (A)  Final   Report Status PENDING  Incomplete   Organism ID, Bacteria KLEBSIELLA PNEUMONIAE  Final      Susceptibility    Klebsiella pneumoniae - MIC*    AMPICILLIN >=32 RESISTANT Resistant     CEFAZOLIN  (NON-URINE) 2 SENSITIVE Sensitive     CEFEPIME <=0.12 SENSITIVE Sensitive     ERTAPENEM <=0.12 SENSITIVE Sensitive     CEFTRIAXONE  <=0.25 SENSITIVE Sensitive     CIPROFLOXACIN <=0.06 SENSITIVE Sensitive     GENTAMICIN <=1 SENSITIVE Sensitive     MEROPENEM <=0.25 SENSITIVE Sensitive     TRIMETH/SULFA <=20 SENSITIVE Sensitive     AMPICILLIN/SULBACTAM 4 SENSITIVE Sensitive     PIP/TAZO Value in next row Sensitive      <=4 SENSITIVEThis is a modified FDA-approved test that has been validated and its performance characteristics determined by the reporting laboratory.  This laboratory is certified under the Clinical Laboratory Improvement Amendments CLIA as qualified to perform high  complexity clinical laboratory testing.    * KLEBSIELLA PNEUMONIAE  Blood Culture ID Panel (Reflexed)     Status: Abnormal   Collection Time: 06/13/24  4:34 AM  Result Value Ref Range Status   Enterococcus faecalis NOT DETECTED NOT DETECTED Final   Enterococcus Faecium NOT DETECTED NOT DETECTED Final   Listeria monocytogenes NOT DETECTED NOT DETECTED Final   Staphylococcus species NOT DETECTED NOT DETECTED Final   Staphylococcus aureus (BCID) NOT DETECTED NOT DETECTED Final   Staphylococcus epidermidis NOT DETECTED NOT DETECTED Final   Staphylococcus lugdunensis NOT DETECTED NOT DETECTED Final   Streptococcus species NOT DETECTED NOT DETECTED Final   Streptococcus agalactiae NOT DETECTED NOT DETECTED Final   Streptococcus pneumoniae NOT DETECTED NOT DETECTED Final   Streptococcus pyogenes NOT DETECTED NOT DETECTED Final   A.calcoaceticus-baumannii NOT DETECTED NOT DETECTED Final   Bacteroides fragilis NOT DETECTED NOT DETECTED Final   Enterobacterales DETECTED (A) NOT DETECTED Final    Comment: Enterobacterales represent a large order of gram negative bacteria, not a single organism. CRITICAL RESULT CALLED TO, READ BACK BY AND  VERIFIED WITH: PHARMD EMERSON GLATTER [id number] @ 2140 FH    Enterobacter cloacae complex NOT DETECTED NOT DETECTED Final   Escherichia coli NOT DETECTED NOT DETECTED Final   Klebsiella aerogenes NOT DETECTED NOT DETECTED Final   Klebsiella oxytoca NOT DETECTED NOT DETECTED Final   Klebsiella pneumoniae DETECTED (A) NOT DETECTED Final    Comment: CRITICAL RESULT CALLED TO, READ BACK BY AND VERIFIED WITH: PHARMD EMERSON GLATTER [id number] @ 2140 FH    Proteus species NOT DETECTED NOT DETECTED Final   Salmonella species NOT DETECTED NOT DETECTED Final   Serratia marcescens NOT DETECTED NOT DETECTED Final   Haemophilus influenzae NOT DETECTED NOT DETECTED Final   Neisseria meningitidis NOT DETECTED NOT DETECTED Final   Pseudomonas aeruginosa NOT DETECTED NOT DETECTED Final   Stenotrophomonas maltophilia NOT DETECTED NOT DETECTED Final   Candida albicans NOT DETECTED NOT DETECTED Final   Candida auris NOT DETECTED NOT DETECTED Final   Candida glabrata NOT DETECTED NOT DETECTED Final   Candida krusei NOT DETECTED NOT DETECTED Final   Candida parapsilosis NOT DETECTED NOT DETECTED Final   Candida tropicalis NOT DETECTED NOT DETECTED Final   Cryptococcus neoformans/gattii NOT DETECTED NOT DETECTED Final   CTX-M ESBL NOT DETECTED NOT DETECTED Final   Carbapenem resistance IMP NOT DETECTED NOT DETECTED Final   Carbapenem resistance KPC NOT DETECTED NOT DETECTED Final   Carbapenem resistance NDM NOT DETECTED NOT DETECTED Final   Carbapenem resist OXA 48 LIKE NOT DETECTED NOT DETECTED Final   Carbapenem resistance VIM NOT DETECTED NOT DETECTED Final    Comment: Performed at [facility], [address] [place], [place] [address]  Culture, blood (routine x 2)     Status: Abnormal (Preliminary result)   Collection Time: 06/13/24  4:39 AM   Specimen: BLOOD  Result Value Ref Range Status   Specimen Description BLOOD SITE NOT SPECIFIED  Final   Special Requests   Final    BOTTLES DRAWN AEROBIC AND  ANAEROBIC Blood Culture adequate volume   Culture  Setup Time   Final    GRAM NEGATIVE RODS IN BOTH AEROBIC AND ANAEROBIC BOTTLES CRITICAL VALUE NOTED.  VALUE IS CONSISTENT WITH PREVIOUSLY REPORTED AND CALLED VALUE.    Culture (A)  Final    KLEBSIELLA PNEUMONIAE SUSCEPTIBILITIES PERFORMED ON PREVIOUS CULTURE WITHIN THE LAST 5 DAYS. Performed at [facility], [address], [place], [place] [address]  Report Status PENDING  Incomplete  Urine Culture     Status: Abnormal   Collection Time: 06/13/24  5:59 AM   Specimen: Urine, Clean Catch  Result Value Ref Range Status   Specimen Description URINE, CLEAN CATCH  Final   Special Requests   Final    NONE Performed at [facility], [address] [place], [place] [address]    Culture >=100,000 COLONIES/mL KLEBSIELLA PNEUMONIAE (A)  Final   Report Status 06/15/2024 FINAL  Final   Organism ID, Bacteria KLEBSIELLA PNEUMONIAE (A)  Final      Susceptibility   Klebsiella pneumoniae - MIC*    AMPICILLIN >=32 RESISTANT Resistant     CEFAZOLIN  (URINE) Value in next row Sensitive      2 SENSITIVEThis is a modified FDA-approved test that has been validated and its performance characteristics determined by the reporting laboratory.  This laboratory is certified under the Clinical Laboratory Improvement Amendments CLIA as qualified to perform high complexity clinical laboratory testing.    CEFEPIME Value in next row Sensitive      2 SENSITIVEThis is a modified FDA-approved test that has been validated and its performance characteristics determined by the reporting laboratory.  This laboratory is certified under the Clinical Laboratory Improvement Amendments CLIA as qualified to perform high complexity clinical laboratory testing.    ERTAPENEM Value in next row Sensitive      2 SENSITIVEThis is a modified FDA-approved test that has been validated and its performance characteristics determined by the reporting laboratory.  This laboratory  is certified under the Clinical Laboratory Improvement Amendments CLIA as qualified to perform high complexity clinical laboratory testing.    CEFTRIAXONE  Value in next row Sensitive      2 SENSITIVEThis is a modified FDA-approved test that has been validated and its performance characteristics determined by the reporting laboratory.  This laboratory is certified under the Clinical Laboratory Improvement Amendments CLIA as qualified to perform high complexity clinical laboratory testing.    CIPROFLOXACIN Value in next row Sensitive      2 SENSITIVEThis is a modified FDA-approved test that has been validated and its performance characteristics determined by the reporting laboratory.  This laboratory is certified under the Clinical Laboratory Improvement Amendments CLIA as qualified to perform high complexity clinical laboratory testing.    GENTAMICIN Value in next row Sensitive      2 SENSITIVEThis is a modified FDA-approved test that has been validated and its performance characteristics determined by the reporting laboratory.  This laboratory is certified under the Clinical Laboratory Improvement Amendments CLIA as qualified to perform high complexity clinical laboratory testing.    NITROFURANTOIN Value in next row Intermediate      2 SENSITIVEThis is a modified FDA-approved test that has been validated and its performance characteristics determined by the reporting laboratory.  This laboratory is certified under the Clinical Laboratory Improvement Amendments CLIA as qualified to perform high complexity clinical laboratory testing.    TRIMETH/SULFA Value in next row Sensitive      2 SENSITIVEThis is a modified FDA-approved test that has been validated and its performance characteristics determined by the reporting laboratory.  This laboratory is certified under the Clinical Laboratory Improvement Amendments CLIA as qualified to perform high complexity clinical laboratory testing.    AMPICILLIN/SULBACTAM  Value in next row Sensitive      2 SENSITIVEThis is a modified FDA-approved test that has been validated and its performance characteristics determined by the reporting laboratory.  This laboratory is certified under the  Clinical Laboratory Improvement Amendments CLIA as qualified to perform high complexity clinical laboratory testing.    PIP/TAZO Value in next row Sensitive      <=4 SENSITIVEThis is a modified FDA-approved test that has been validated and its performance characteristics determined by the reporting laboratory.  This laboratory is certified under the Clinical Laboratory Improvement Amendments CLIA as qualified to perform high complexity clinical laboratory testing.    MEROPENEM Value in next row Sensitive      <=4 SENSITIVEThis is a modified FDA-approved test that has been validated and its performance characteristics determined by the reporting laboratory.  This laboratory is certified under the Clinical Laboratory Improvement Amendments CLIA as qualified to perform high complexity clinical laboratory testing.    * >=100,000 COLONIES/mL KLEBSIELLA PNEUMONIAE     Radiology Studies: No results found.   Scheduled Meds:  apixaban   5 mg Oral BID   chlorpheniramine-HYDROcodone   5 mL Oral Q12H   Gerhardt's butt cream   Topical TID   guaiFENesin   10 mL Oral Q6H   insulin  aspart  0-20 Units Subcutaneous TID WC   insulin  aspart  0-5 Units Subcutaneous QHS   insulin  aspart  10 Units Subcutaneous TID WC   insulin  glargine  30 Units Subcutaneous BID   phenazopyridine   200 mg Oral TID WC   tamsulosin   0.4 mg Oral Daily   Continuous Infusions:  sodium chloride      cefTRIAXone  (ROCEPHIN )  IV 2 g (06/14/24 2152)     LOS: 2 days   Ivonne Mustache, MD [facility] P1/02/2025, 11:11 AM  "

## 2024-06-15 NOTE — Inpatient Diabetes Management (Signed)
 Inpatient Diabetes Program Recommendations  AACE/ADA: New Consensus Statement on Inpatient Glycemic Control (2015)  Target Ranges:  Prepandial:   less than 140 mg/dL      Peak postprandial:   less than 180 mg/dL (1-2 hours)      Critically ill patients:  140 - 180 mg/dL   Lab Results  Component Value Date   GLUCAP 347 (H) 06/15/2024   HGBA1C 13.3 (H) 06/13/2024    Review of Glycemic Control  Gave handout on [facility] program for OP prescriptions for uninsured.   May benefit from GLP-1 - Trulicity is on list of [facility]  Reviewed insulin  pen administration and calling number to sign up for medication assistance. Feels comfortable in going home on insulin . Will f/u with PCP. Answered all questions.  Inpatient Diabetes Program Recommendations:    Discharge Recommendations: Other recommendations: Consider Trulicity - can obtain through [facility] program Long acting recommendations: Insulin  Glargine (BASAGLAR ) Kwikpen 32 units BID  Short acting recommendations:  Meal + Correction coverage Insulin  lispro (HUMALOG ) KwikPen  Sensitive Scale.  15 units TID with meals Supply/Referral recommendations: Pen needles - long (BMI >40) Glucometer Test strips Lancet device Lancets Referral to Nutrition [facility] Diab Services   Use Adult Diabetes Insulin  Treatment Post Discharge order set.  Thank you. Shona Brandy, RD, LDN, CDCES Inpatient Diabetes Coordinator [phone]

## 2024-06-16 LAB — COMPREHENSIVE METABOLIC PANEL WITH GFR
ALT: 724 U/L — ABNORMAL HIGH (ref 0–44)
AST: 131 U/L — ABNORMAL HIGH (ref 15–41)
Albumin: 2.5 g/dL — ABNORMAL LOW (ref 3.5–5.0)
Alkaline Phosphatase: 213 U/L — ABNORMAL HIGH (ref 38–126)
Anion gap: 8 (ref 5–15)
BUN: 9 mg/dL (ref 6–20)
CO2: 27 mmol/L (ref 22–32)
Calcium: 8.1 mg/dL — ABNORMAL LOW (ref 8.9–10.3)
Chloride: 98 mmol/L (ref 98–111)
Creatinine, Ser: 0.39 mg/dL — ABNORMAL LOW (ref 0.61–1.24)
GFR, Estimated: >60 mL/min
Glucose, Bld: 279 mg/dL — ABNORMAL HIGH (ref 70–99)
Potassium: 3.4 mmol/L — ABNORMAL LOW (ref 3.5–5.1)
Sodium: 133 mmol/L — ABNORMAL LOW (ref 135–145)
Total Bilirubin: 8.2 mg/dL — ABNORMAL HIGH (ref 0.0–1.2)
Total Protein: 5 g/dL — ABNORMAL LOW (ref 6.5–8.1)

## 2024-06-16 LAB — GLUCOSE, CAPILLARY
Glucose-Capillary: 254 mg/dL — ABNORMAL HIGH (ref 70–99)
Glucose-Capillary: 236 mg/dL — ABNORMAL HIGH (ref 70–99)
Glucose-Capillary: 157 mg/dL — ABNORMAL HIGH (ref 70–99)
Glucose-Capillary: 148 mg/dL — ABNORMAL HIGH (ref 70–99)

## 2024-06-16 MED ORDER — HYDROCOD POLI-CHLORPHE POLI ER 10-8 MG/5ML PO SUER
10.0000 mL | Freq: Two times a day (BID) | ORAL | Status: DC
  Administered 2024-06-16 – 2024-06-17 (×2): 10 mL via ORAL
  Filled 2024-06-16 (×2): qty 10

## 2024-06-16 MED ORDER — INSULIN ASPART 100 UNIT/ML IJ SOLN
15.0000 [IU] | Freq: Three times a day (TID) | INTRAMUSCULAR | Status: DC
  Administered 2024-06-16 – 2024-06-17 (×4): 15 [IU] via SUBCUTANEOUS
  Filled 2024-06-16 (×4): qty 15

## 2024-06-16 MED ORDER — INSULIN GLARGINE 100 UNIT/ML ~~LOC~~ SOLN
32.0000 [IU] | Freq: Two times a day (BID) | SUBCUTANEOUS | Status: DC
  Administered 2024-06-16 – 2024-06-17 (×2): 32 [IU] via SUBCUTANEOUS
  Filled 2024-06-16 (×3): qty 0.32

## 2024-06-16 MED ORDER — POTASSIUM CHLORIDE CRYS ER 20 MEQ PO TBCR
40.0000 meq | EXTENDED_RELEASE_TABLET | Freq: Once | ORAL | Status: AC
  Administered 2024-06-16: 40 meq via ORAL
  Filled 2024-06-16: qty 2

## 2024-06-16 MED ORDER — OXYCODONE HCL 5 MG PO TABS
10.0000 mg | ORAL_TABLET | Freq: Four times a day (QID) | ORAL | Status: DC | PRN
  Administered 2024-06-16: 10 mg via ORAL
  Filled 2024-06-16: qty 2

## 2024-06-16 MED ORDER — GUAIFENESIN-DM 100-10 MG/5ML PO SYRP
15.0000 mL | ORAL_SOLUTION | Freq: Four times a day (QID) | ORAL | Status: DC
  Administered 2024-06-16 – 2024-06-17 (×4): 15 mL via ORAL
  Filled 2024-06-16 (×4): qty 15

## 2024-06-16 NOTE — Progress Notes (Signed)
 "  Assessment & Plan Viral URI Viral upper respiratory infection with asthma exacerbation Persistent cough, wheezing, and congestion likely viral. No bacterial infection suspected, antibiotics not indicated. Steroids considered for inflammation and muscle soreness. - Administered breathing treatment in office. - Continue using Air Supra inhaler twice daily. - Advised to monitor symptoms and report worsening via MyChart. Orders:   methylPREDNISolone  acetate (DEPO-MEDROL ) injection 80 mg   ipratropium-albuterol  (DUONEB) 0.5-2.5 (3) MG/3ML nebulizer solution 3 mL   benzonatate  (TESSALON ) 200 MG capsule; Take 1 capsule (200 mg total) by mouth 3 (three) times daily as needed for cough.  Mild persistent asthma with acute exacerbation Asthma exacerbation with wheezing and difficulty breathing. - Administered breathing treatment in office. - Continue using Air Supra inhaler twice daily. - Advised to monitor symptoms and report worsening via MyChart. Orders:   methylPREDNISolone  acetate (DEPO-MEDROL ) injection 80 mg   ipratropium-albuterol  (DUONEB) 0.5-2.5 (3) MG/3ML nebulizer solution 3 mL  Uncontrolled type 2 diabetes mellitus with hyperglycemia (HCC) A1c elevated to 13, previously 7.2. Prednisone  may elevate blood sugar. Home readings high, around 300 mg/dL. Discussed Medicaid for medication and glucose monitoring access. - Advised to monitor blood sugar levels at home. - Discussed potential for Medicaid coverage to improve access to medications and glucose monitoring.    Pain of upper abdomen Abdominal pain and epigastric discomfort likely due to gastritis exacerbated by coughing and diet. Dehydration suspected from dark urine and reduced intake. Symptoms suggestive of gastritis, famotidine recommended. - Advised bland diet to reduce stomach irritation. - Encouraged increased fluid intake to address dehydration.       Follow up plan: Return if symptoms worsen or fail to  improve.  Niki Rung, MSN, APRN, FNP-C  Subjective:  HPI: Larry Fowler is a 54 y.o. male presenting on 06/11/2024 for Cough (Started 06/01/2024: cough, wheezing, congestion- brownish, fever, fatigue, some diarrhea (2 days), decreased appetite. )  Discussed the use of AI scribe software for clinical note transcription with the patient, who gave verbal consent to proceed.  Patient is accompanied by his wife, who he is okay with being present.  He became ill the day after Christmas, experiencing symptoms including coughing, wheezing, congestion, fever, fatigue, diarrhea, and decreased appetite. The congestion affects both his head and chest, with a persistent cough that worsens when lying down. The cough is sometimes dry and sometimes produces a taste he describes as 'infected'.  He has been using Delsym  at home without significant relief. He also uses an albuterol  inhaler and finds significant relief with Air Supra, which he uses twice daily. However, he notes that he cannot afford the medication due to lack of insurance.  His urine has been dark, and he struggles to drink water due to stomach pain. He describes the pain as a soreness in his abdominal muscles from coughing and a general stomach ache that worsens with drinking water. No acid reflux.  He has a history of asthma and notes that his symptoms have been particularly severe this time, stating, 'I've never been this sick before.' He also reports a history of high A1c levels, with a recent reading of 13, up from 7.2, despite dietary improvements.   He experiences epigastric pain when drinking, which he describes as feeling like being 'kicked in the balls'. He has diarrhea and occasional nausea and vomiting, with a recent episode after consuming tomato soup. His stomach pain is similar to that experienced during a stomach bug.  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 He receives Social  Security disability benefits.       ROS: Negative unless specifically indicated above in HPI.   Relevant past medical history reviewed and updated as indicated.   Allergies and medications reviewed and updated.  Current Medications[1]  Allergies[2]  Objective:   BP 138/78   Pulse 78   Temp 97.9 F (36.6 C)   Resp 18   Ht 6' 2 (1.88 m)   Wt (!) 441 lb (200 kg)   SpO2 95%   BMI 56.62 kg/m    Physical Exam Vitals reviewed.  Constitutional:      General: He is not in acute distress.    Appearance: Normal appearance. He is morbidly obese. He is ill-appearing. He is not toxic-appearing or diaphoretic.  HENT:     Head: Normocephalic and atraumatic.     Right Ear: Tympanic membrane, ear canal and external ear normal. There is no impacted cerumen.     Left Ear: Tympanic membrane, ear canal and external ear normal. There is no impacted cerumen.     Nose: Congestion present.     Right Sinus: No maxillary sinus tenderness or frontal sinus tenderness.     Left Sinus: No maxillary sinus tenderness or frontal sinus tenderness.     Mouth/Throat:     Mouth: Mucous membranes are moist.     Pharynx: Oropharynx is clear. No oropharyngeal exudate or posterior oropharyngeal erythema.     Tonsils: No tonsillar exudate or tonsillar abscesses.  Eyes:     General: No scleral icterus.       Right eye: No discharge.        Left eye: No discharge.     Conjunctiva/sclera: Conjunctivae normal.  Cardiovascular:     Rate and Rhythm: Normal rate and regular rhythm.     Heart sounds: Normal heart sounds. No murmur heard.    No friction rub. No gallop.  Pulmonary:     Effort: Pulmonary effort is normal. No respiratory distress.     Breath sounds: No stridor. Wheezing present. No rhonchi or rales.  Musculoskeletal:        General: Normal range of motion.     Cervical back: Normal range of motion.  Lymphadenopathy:     Cervical: No cervical adenopathy.  Skin:    General: Skin is warm and dry.   Neurological:     Mental Status: He is alert and oriented to person, place, and time. Mental status is at baseline.     Gait: Gait abnormal (riding in wheelchair).  Psychiatric:        Mood and Affect: Mood normal.        Behavior: Behavior normal.        Thought Content: Thought content normal.        Judgment: Judgment normal.            [1] No current facility-administered medications for this visit. No current outpatient medications on file.  Facility-Administered Medications Ordered in Other Visits:    albuterol  (VENTOLIN  HFA) 108 (90 Base) MCG/ACT inhaler 2 puff, 2 puff, Inhalation, Q6H PRN, Adhikari, Amrit, MD   apixaban  (ELIQUIS ) tablet 5 mg, 5 mg, Oral, BID, Opyd, Timothy S, MD, 5 mg at 06/15/24 2249   cefadroxil  (DURICEF) capsule 1,000 mg, 1,000 mg, Oral, BID, Adhikari, Amrit, MD, 1,000 mg at 06/15/24 2232   chlorpheniramine-HYDROcodone  (TUSSIONEX) 10-8 MG/5ML suspension 5 mL, 5 mL, Oral, Q12H, Adhikari, Amrit, MD, 5 mL at 06/15/24 2232   Gerhardt's butt cream, ,  Topical, TID, Jillian Buttery, MD, Given at 06/15/24 2233   guaiFENesin  (ROBITUSSIN) 100 MG/5ML liquid 10 mL, 10 mL, Oral, Q6H, Adhikari, Amrit, MD, 10 mL at 06/16/24 0522   insulin  aspart (novoLOG ) injection 0-20 Units, 0-20 Units, Subcutaneous, TID WC, Adhikari, Amrit, MD, 4 Units at 06/15/24 1706   insulin  aspart (novoLOG ) injection 0-5 Units, 0-5 Units, Subcutaneous, QHS, Adhikari, Amrit, MD, 4 Units at 06/14/24 2144   insulin  aspart (novoLOG ) injection 10 Units, 10 Units, Subcutaneous, TID WC, Adhikari, Amrit, MD, 10 Units at 06/15/24 1706   insulin  glargine (LANTUS ) injection 30 Units, 30 Units, Subcutaneous, BID, Adhikari, Amrit, MD, 30 Units at 06/15/24 2243   ondansetron  (ZOFRAN ) injection 4 mg, 4 mg, Intravenous, Q6H PRN, Adhikari, Amrit, MD   phenazopyridine  (PYRIDIUM ) tablet 200 mg, 200 mg, Oral, TID WC, Adhikari, Amrit, MD, 200 mg at 06/15/24 1706   potassium chloride  SA (KLOR-CON  M) CR tablet 40  mEq, 40 mEq, Oral, Once, Adhikari, Amrit, MD   tamsulosin  (FLOMAX ) capsule 0.4 mg, 0.4 mg, Oral, Daily, Adhikari, Amrit, MD, 0.4 mg at 06/15/24 1034 [2]  Allergies Allergen Reactions   Metformin  And Related Diarrhea and Nausea Only   "

## 2024-06-16 NOTE — Assessment & Plan Note (Signed)
 Asthma exacerbation with wheezing and difficulty breathing. - Administered breathing treatment in office. - Continue using Air Supra inhaler twice daily. - Advised to monitor symptoms and report worsening via MyChart. Orders:   methylPREDNISolone  acetate (DEPO-MEDROL ) injection 80 mg   ipratropium-albuterol  (DUONEB) 0.5-2.5 (3) MG/3ML nebulizer solution 3 mL

## 2024-06-16 NOTE — Plan of Care (Signed)

## 2024-06-16 NOTE — Progress Notes (Signed)
 " PROGRESS NOTE  Larry Fowler  FMW:[id number] DOB: 05/25/1971 DOA: 06/12/2024 PCP: Alvia Corean CROME, FNP   Brief Narrative: Larry Fowler is a 54 y.o. male with medical history significant of diabetes type 2, paroxysmal A-fib, BPH, morbid obesity who presented with complaint of being weak, dehydrated, poor oral intake.  He was recently diagnosed with flu on December 26 and he dad developed fever, cough, body aches, sore throat.  Flu swab was positive for that time.  He reported persistent fever.  Lab work showed elevated liver enzymes, UA was suspicious for UTI, lactic acid was elevated.  Blood cultures showed  Klebsiella in both sets.  Sepsis physiology has  improved.  Liver enzymes improving. Possible discharge home tomorrow.  Patient does not feel ready to go home today.  Assessment & Plan:  Principal Problem:   Sepsis (HCC) Active Problems:   Morbid obesity (HCC)   Renal stone   OSA (obstructive sleep apnea)   Diabetes (HCC)   Uncontrolled type 2 diabetes mellitus with hyperglycemia (HCC)   Saddle pulmonary embolus (HCC)   History of pulmonary embolism   Elevated LFTs   Acute hepatitis   Shock liver   Pyelonephritis   Bacteremia due to Klebsiella pneumoniae   Severe sepsis secondary to UTI/pyelonephritis/Klebsiella bacteremia: Presented with generalized weakness, mild -grade fever, sinus tachycardia.  Lab work showed elevated lactate.  UA suspicious for UTI.  CT abdomen/pelvis shows possible right-sided pyelonephritis with nonobstructing stone. Blood cultures/urine culture showed Klebsiella.  Afebrile this morning.  Lactic acidosis resolved.  Antibiotics changed to oral.   Elevated liver enzymes: Unclear etiology.  Likely from sepsis or viral versus drug-induced.  ASMA negative ,ANA positive.  Negative CMV/IgG positive for EBV.  Hepatitis panel negative.  Right upper quadrant ultrasound does not show any signs of cholecystitis or obstruction.  Tylenol  level less than 10.   Liver enzymes improving, bilirubin improving  Uncontrolled diabetes type 2: A1c of 13.3.  Only takes pioglitazone  at home.  Diabetic coordinator consulted.  Continue current insulin  regimen.  Monitor blood sugars.  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  Currently on room air.   Hyponatremia: Sodium in the range of 120s on presentation this is most likely from pseudohyponatremia from hyperglycemia.  Much better now  Renal stone: CT imaging on presentation showed right lower pole nonobstructive stone of 10 x 8 mm.  He needs to follow-up with [facility] on discharge   Morbid obesity: BMI 56   OSA: Does not use CPAP  Deconditioning: PT consulted, outpatient follow-up recommended          DVT prophylaxis: apixaban  (ELIQUIS ) tablet 5 mg     Code Status: Prior  Family Communication: Wife at bedside on 1/9  Patient status:Inpatient  Patient is from :home  Anticipated discharge un:ynfz  Estimated DC date: Tomorrow  Consultants: GI  Procedures:None  Antimicrobials:  Anti-infectives (From admission, onward)    Start     Dose/Rate Route Frequency Ordered Stop   06/15/24 1215  cefadroxil  (DURICEF) capsule 1,000 mg        1,000 mg Oral 2 times daily 06/15/24 1118 06/23/24 0959   06/13/24 2230  cefTRIAXone  (ROCEPHIN ) 2 g in sodium chloride  0.9 % 100 mL IVPB  Status:  Discontinued        2 g 200 mL/hr over 30 Minutes Intravenous Every 24 hours 06/13/24 2144 06/15/24 1118  06/13/24 0800  piperacillin -tazobactam (ZOSYN ) IVPB 3.375 g  Status:  Discontinued        3.375 g 12.5 mL/hr over 240 Minutes Intravenous Every 8 hours 06/13/24 0748 06/13/24 2144   06/13/24 0445  piperacillin -tazobactam (ZOSYN ) IVPB 3.375 g        3.375 g 100 mL/hr over 30 Minutes Intravenous  Once 06/13/24 0436 06/13/24 9370       Subjective: Patient seen and examined at bedside today.   Hemodynamically stable.  Lying on bed.  Comfortable.  Afebrile.  Liver enzymes improving.  Denies any abdomen pain, nausea or vomiting.  Still having some cough.  Doesnot feel ready to go home today  Objective: Vitals:   06/15/24 1609 06/15/24 1959 06/16/24 0450 06/16/24 0917  BP: 127/72 131/70 110/61 (!) 112/57  Pulse: 73 78 82 73  Resp: 17 18 18 20   Temp: 98 F (36.7 C) 98.8 F (37.1 C) 99.3 F (37.4 C) 98.1 F (36.7 C)  TempSrc:  Oral    SpO2: 95% 95% 94% 94%    Intake/Output Summary (Last 24 hours) at 06/16/2024 1048 Last data filed at 06/16/2024 0559 Gross per 24 hour  Intake 1469.91 ml  Output 300 ml  Net 1169.91 ml   There were no vitals filed for this visit.  Examination:  General exam: Overall comfortable, not in distress, morbidly obese HEENT: PERRL Respiratory system:  no wheezes or crackles  Cardiovascular system: S1 & S2 heard, RRR.  Gastrointestinal system: Abdomen is nondistended, soft and nontender. Central nervous system: Alert and oriented Extremities: No edema, no clubbing ,no cyanosis Skin: No rashes, no ulcers,no icterus      Data Reviewed: I have personally reviewed following labs and imaging studies  CBC: Recent Labs  Lab 06/12/24 1455 06/14/24 0442  WBC 7.9 7.8  NEUTROABS 6.2  --   HGB 15.0 13.5  HCT 44.8 39.5  MCV 92.4 89.8  PLT 132* 118*   Basic Metabolic Panel: Recent Labs  Lab 06/12/24 1455 06/13/24 0800 06/14/24 0442 06/15/24 0245 06/16/24 0241  NA 121* 122* 125* 130* 133*  K 4.4 4.4 3.8 3.7 3.4*  CL 85* 87* 91* 96* 98  CO2 19* 21* 24 25 27   GLUCOSE 468* 447* 331* 305* 279*  BUN 13 16 16 11 9   CREATININE 0.65 0.55* 0.62 0.60* 0.39*  CALCIUM 9.0 8.5* 8.5* 8.5* 8.1*     Recent Results (from the past 240 hours)  Culture, blood (routine x 2)     Status: Abnormal   Collection Time: 06/13/24  4:34 AM   Specimen: BLOOD  Result Value Ref Range Status   Specimen Description BLOOD SITE NOT SPECIFIED  Final   Special  Requests   Final    BOTTLES DRAWN AEROBIC AND ANAEROBIC Blood Culture results may not be optimal due to an inadequate volume of blood received in culture bottles   Culture  Setup Time   Final    GRAM NEGATIVE RODS IN BOTH AEROBIC AND ANAEROBIC BOTTLES CRITICAL RESULT CALLED TO, READ BACK BY AND VERIFIED WITH: MAYA EMERSON GLATTER [id number] @ 2140 FH Performed at [facility], [address], [place], [place] [address]    Culture KLEBSIELLA PNEUMONIAE (A)  Final   Report Status 06/15/2024 FINAL  Final   Organism ID, Bacteria KLEBSIELLA PNEUMONIAE  Final      Susceptibility   Klebsiella pneumoniae - MIC*    AMPICILLIN >=32 RESISTANT Resistant     CEFAZOLIN  (NON-URINE) 2 SENSITIVE Sensitive     CEFEPIME <=  0.12 SENSITIVE Sensitive     ERTAPENEM <=0.12 SENSITIVE Sensitive     CEFTRIAXONE  <=0.25 SENSITIVE Sensitive     CIPROFLOXACIN <=0.06 SENSITIVE Sensitive     GENTAMICIN <=1 SENSITIVE Sensitive     MEROPENEM <=0.25 SENSITIVE Sensitive     TRIMETH/SULFA <=20 SENSITIVE Sensitive     AMPICILLIN/SULBACTAM 4 SENSITIVE Sensitive     PIP/TAZO Value in next row Sensitive      <=4 SENSITIVEThis is a modified FDA-approved test that has been validated and its performance characteristics determined by the reporting laboratory.  This laboratory is certified under the Clinical Laboratory Improvement Amendments CLIA as qualified to perform high complexity clinical laboratory testing.    * KLEBSIELLA PNEUMONIAE  Blood Culture ID Panel (Reflexed)     Status: Abnormal   Collection Time: 06/13/24  4:34 AM  Result Value Ref Range Status   Enterococcus faecalis NOT DETECTED NOT DETECTED Final   Enterococcus Faecium NOT DETECTED NOT DETECTED Final   Listeria monocytogenes NOT DETECTED NOT DETECTED Final   Staphylococcus species NOT DETECTED NOT DETECTED Final   Staphylococcus aureus (BCID) NOT DETECTED NOT DETECTED Final   Staphylococcus epidermidis NOT DETECTED NOT DETECTED Final   Staphylococcus  lugdunensis NOT DETECTED NOT DETECTED Final   Streptococcus species NOT DETECTED NOT DETECTED Final   Streptococcus agalactiae NOT DETECTED NOT DETECTED Final   Streptococcus pneumoniae NOT DETECTED NOT DETECTED Final   Streptococcus pyogenes NOT DETECTED NOT DETECTED Final   A.calcoaceticus-baumannii NOT DETECTED NOT DETECTED Final   Bacteroides fragilis NOT DETECTED NOT DETECTED Final   Enterobacterales DETECTED (A) NOT DETECTED Final    Comment: Enterobacterales represent a large order of gram negative bacteria, not a single organism. CRITICAL RESULT CALLED TO, READ BACK BY AND VERIFIED WITH: PHARMD EMERSON GLATTER [id number] @ 2140 FH    Enterobacter cloacae complex NOT DETECTED NOT DETECTED Final   Escherichia coli NOT DETECTED NOT DETECTED Final   Klebsiella aerogenes NOT DETECTED NOT DETECTED Final   Klebsiella oxytoca NOT DETECTED NOT DETECTED Final   Klebsiella pneumoniae DETECTED (A) NOT DETECTED Final    Comment: CRITICAL RESULT CALLED TO, READ BACK BY AND VERIFIED WITH: PHARMD EMERSON GLATTER [id number] @ 2140 FH    Proteus species NOT DETECTED NOT DETECTED Final   Salmonella species NOT DETECTED NOT DETECTED Final   Serratia marcescens NOT DETECTED NOT DETECTED Final   Haemophilus influenzae NOT DETECTED NOT DETECTED Final   Neisseria meningitidis NOT DETECTED NOT DETECTED Final   Pseudomonas aeruginosa NOT DETECTED NOT DETECTED Final   Stenotrophomonas maltophilia NOT DETECTED NOT DETECTED Final   Candida albicans NOT DETECTED NOT DETECTED Final   Candida auris NOT DETECTED NOT DETECTED Final   Candida glabrata NOT DETECTED NOT DETECTED Final   Candida krusei NOT DETECTED NOT DETECTED Final   Candida parapsilosis NOT DETECTED NOT DETECTED Final   Candida tropicalis NOT DETECTED NOT DETECTED Final   Cryptococcus neoformans/gattii NOT DETECTED NOT DETECTED Final   CTX-M ESBL NOT DETECTED NOT DETECTED Final   Carbapenem resistance IMP NOT DETECTED NOT DETECTED Final   Carbapenem  resistance KPC NOT DETECTED NOT DETECTED Final   Carbapenem resistance NDM NOT DETECTED NOT DETECTED Final   Carbapenem resist OXA 48 LIKE NOT DETECTED NOT DETECTED Final   Carbapenem resistance VIM NOT DETECTED NOT DETECTED Final    Comment: Performed at [facility], [address] [place], [place] [address]  Culture, blood (routine x 2)     Status: Abnormal   Collection Time: 06/13/24  4:39  AM   Specimen: BLOOD  Result Value Ref Range Status   Specimen Description BLOOD SITE NOT SPECIFIED  Final   Special Requests   Final    BOTTLES DRAWN AEROBIC AND ANAEROBIC Blood Culture adequate volume   Culture  Setup Time   Final    GRAM NEGATIVE RODS IN BOTH AEROBIC AND ANAEROBIC BOTTLES CRITICAL VALUE NOTED.  VALUE IS CONSISTENT WITH PREVIOUSLY REPORTED AND CALLED VALUE.    Culture (A)  Final    KLEBSIELLA PNEUMONIAE SUSCEPTIBILITIES PERFORMED ON PREVIOUS CULTURE WITHIN THE LAST 5 DAYS. Performed at [facility], [address], [place], [place] [address]    Report Status 06/15/2024 FINAL  Final  Urine Culture     Status: Abnormal   Collection Time: 06/13/24  5:59 AM   Specimen: Urine, Clean Catch  Result Value Ref Range Status   Specimen Description URINE, CLEAN CATCH  Final   Special Requests   Final    NONE Performed at [facility], [address], [place], [place] [address]    Culture >=100,000 COLONIES/mL KLEBSIELLA PNEUMONIAE (A)  Final   Report Status 06/15/2024 FINAL  Final   Organism ID, Bacteria KLEBSIELLA PNEUMONIAE (A)  Final      Susceptibility   Klebsiella pneumoniae - MIC*    AMPICILLIN >=32 RESISTANT Resistant     CEFAZOLIN  (URINE) Value in next row Sensitive      2 SENSITIVEThis is a modified FDA-approved test that has been validated and its performance characteristics determined by the reporting laboratory.  This laboratory is certified under the Clinical Laboratory Improvement Amendments CLIA as qualified to perform high complexity clinical  laboratory testing.    CEFEPIME Value in next row Sensitive      2 SENSITIVEThis is a modified FDA-approved test that has been validated and its performance characteristics determined by the reporting laboratory.  This laboratory is certified under the Clinical Laboratory Improvement Amendments CLIA as qualified to perform high complexity clinical laboratory testing.    ERTAPENEM Value in next row Sensitive      2 SENSITIVEThis is a modified FDA-approved test that has been validated and its performance characteristics determined by the reporting laboratory.  This laboratory is certified under the Clinical Laboratory Improvement Amendments CLIA as qualified to perform high complexity clinical laboratory testing.    CEFTRIAXONE  Value in next row Sensitive      2 SENSITIVEThis is a modified FDA-approved test that has been validated and its performance characteristics determined by the reporting laboratory.  This laboratory is certified under the Clinical Laboratory Improvement Amendments CLIA as qualified to perform high complexity clinical laboratory testing.    CIPROFLOXACIN Value in next row Sensitive      2 SENSITIVEThis is a modified FDA-approved test that has been validated and its performance characteristics determined by the reporting laboratory.  This laboratory is certified under the Clinical Laboratory Improvement Amendments CLIA as qualified to perform high complexity clinical laboratory testing.    GENTAMICIN Value in next row Sensitive      2 SENSITIVEThis is a modified FDA-approved test that has been validated and its performance characteristics determined by the reporting laboratory.  This laboratory is certified under the Clinical Laboratory Improvement Amendments CLIA as qualified to perform high complexity clinical laboratory testing.    NITROFURANTOIN Value in next row Intermediate      2 SENSITIVEThis is a modified FDA-approved test that has been validated and its performance  characteristics determined by the reporting laboratory.  This laboratory is  certified under the Clinical Laboratory Improvement Amendments CLIA as qualified to perform high complexity clinical laboratory testing.    TRIMETH/SULFA Value in next row Sensitive      2 SENSITIVEThis is a modified FDA-approved test that has been validated and its performance characteristics determined by the reporting laboratory.  This laboratory is certified under the Clinical Laboratory Improvement Amendments CLIA as qualified to perform high complexity clinical laboratory testing.    AMPICILLIN/SULBACTAM Value in next row Sensitive      2 SENSITIVEThis is a modified FDA-approved test that has been validated and its performance characteristics determined by the reporting laboratory.  This laboratory is certified under the Clinical Laboratory Improvement Amendments CLIA as qualified to perform high complexity clinical laboratory testing.    PIP/TAZO Value in next row Sensitive      <=4 SENSITIVEThis is a modified FDA-approved test that has been validated and its performance characteristics determined by the reporting laboratory.  This laboratory is certified under the Clinical Laboratory Improvement Amendments CLIA as qualified to perform high complexity clinical laboratory testing.    MEROPENEM Value in next row Sensitive      <=4 SENSITIVEThis is a modified FDA-approved test that has been validated and its performance characteristics determined by the reporting laboratory.  This laboratory is certified under the Clinical Laboratory Improvement Amendments CLIA as qualified to perform high complexity clinical laboratory testing.    * >=100,000 COLONIES/mL KLEBSIELLA PNEUMONIAE     Radiology Studies: No results found.   Scheduled Meds:  apixaban   5 mg Oral BID   cefadroxil   1,000 mg Oral BID   chlorpheniramine-HYDROcodone   5 mL Oral Q12H   Gerhardt's butt cream   Topical TID   guaiFENesin -dextromethorphan   15 mL Oral  Q6H   insulin  aspart  0-20 Units Subcutaneous TID WC   insulin  aspart  0-5 Units Subcutaneous QHS   insulin  aspart  15 Units Subcutaneous TID WC   insulin  glargine  32 Units Subcutaneous BID   phenazopyridine   200 mg Oral TID WC   tamsulosin   0.4 mg Oral Daily   Continuous Infusions:     LOS: 3 days   Ivonne Mustache, MD [facility] P1/03/2025, 10:48 AM  PROGRESS NOTE  Larry Fowler  FMW:[id number] DOB: 05-21-1971 DOA: 06/12/2024 PCP: Alvia Corean CROME, FNP   Brief Narrative:   Assessment & Plan:  Principal Problem:   Sepsis (HCC) Active Problems:   Morbid obesity (HCC)   Renal stone   OSA (obstructive sleep apnea)   Diabetes (HCC)   Uncontrolled type 2 diabetes mellitus with hyperglycemia (HCC)   Saddle pulmonary embolus (HCC)   History of pulmonary embolism   Elevated LFTs   Acute hepatitis   Shock liver   Pyelonephritis   Bacteremia due to Klebsiella pneumoniae           DVT prophylaxis: apixaban  (ELIQUIS ) tablet 5 mg     Code Status: Prior  Family Communication:   Patient status:  Patient is from :  Anticipated discharge to:  Estimated DC date:   Consultants:   Procedures:  Antimicrobials:  Anti-infectives (From admission, onward)    Start     Dose/Rate Route Frequency Ordered Stop   06/15/24 1215  cefadroxil  (DURICEF) capsule 1,000 mg        1,000 mg Oral 2 times daily 06/15/24 1118 06/23/24 0959   06/13/24 2230  cefTRIAXone  (ROCEPHIN ) 2 g in sodium chloride  0.9 % 100 mL IVPB  Status:  Discontinued        2  g 200 mL/hr over 30 Minutes Intravenous Every 24 hours 06/13/24 2144 06/15/24 1118   06/13/24 0800  piperacillin -tazobactam (ZOSYN ) IVPB 3.375 g  Status:  Discontinued        3.375 g 12.5 mL/hr over 240 Minutes Intravenous Every 8 hours 06/13/24 0748 06/13/24 2144   06/13/24 0445  piperacillin -tazobactam (ZOSYN ) IVPB 3.375 g        3.375 g 100 mL/hr over 30 Minutes Intravenous  Once 06/13/24 0436 06/13/24 0629        Subjective:   Objective: Vitals:   06/15/24 1609 06/15/24 1959 06/16/24 0450 06/16/24 0917  BP: 127/72 131/70 110/61 (!) 112/57  Pulse: 73 78 82 73  Resp: 17 18 18 20   Temp: 98 F (36.7 C) 98.8 F (37.1 C) 99.3 F (37.4 C) 98.1 F (36.7 C)  TempSrc:  Oral    SpO2: 95% 95% 94% 94%    Intake/Output Summary (Last 24 hours) at 06/16/2024 1048 Last data filed at 06/16/2024 0559 Gross per 24 hour  Intake 1469.91 ml  Output 300 ml  Net 1169.91 ml   There were no vitals filed for this visit.  Examination:  General exam: Overall comfortable, not in distress HEENT: PERRL Respiratory system:  no wheezes or crackles  Cardiovascular system: S1 & S2 heard, RRR.  Gastrointestinal system: Abdomen is nondistended, soft and nontender. Central nervous system: Alert and oriented Extremities: No edema, no clubbing ,no cyanosis Skin: No rashes, no ulcers,no icterus     Data Reviewed: I have personally reviewed following labs and imaging studies  CBC: Recent Labs  Lab 06/12/24 1455 06/14/24 0442  WBC 7.9 7.8  NEUTROABS 6.2  --   HGB 15.0 13.5  HCT 44.8 39.5  MCV 92.4 89.8  PLT 132* 118*   Basic Metabolic Panel: Recent Labs  Lab 06/12/24 1455 06/13/24 0800 06/14/24 0442 06/15/24 0245 06/16/24 0241  NA 121* 122* 125* 130* 133*  K 4.4 4.4 3.8 3.7 3.4*  CL 85* 87* 91* 96* 98  CO2 19* 21* 24 25 27   GLUCOSE 468* 447* 331* 305* 279*  BUN 13 16 16 11 9   CREATININE 0.65 0.55* 0.62 0.60* 0.39*  CALCIUM 9.0 8.5* 8.5* 8.5* 8.1*     Recent Results (from the past 240 hours)  Culture, blood (routine x 2)     Status: Abnormal   Collection Time: 06/13/24  4:34 AM   Specimen: BLOOD  Result Value Ref Range Status   Specimen Description BLOOD SITE NOT SPECIFIED  Final   Special Requests   Final    BOTTLES DRAWN AEROBIC AND ANAEROBIC Blood Culture results may not be optimal due to an inadequate volume of blood received in culture bottles   Culture  Setup Time   Final    GRAM  NEGATIVE RODS IN BOTH AEROBIC AND ANAEROBIC BOTTLES CRITICAL RESULT CALLED TO, READ BACK BY AND VERIFIED WITH: MAYA EMERSON GLATTER [id number] @ 2140 FH Performed at [facility], [address], [place], [place] [address]    Culture KLEBSIELLA PNEUMONIAE (A)  Final   Report Status 06/15/2024 FINAL  Final   Organism ID, Bacteria KLEBSIELLA PNEUMONIAE  Final      Susceptibility   Klebsiella pneumoniae - MIC*    AMPICILLIN >=32 RESISTANT Resistant     CEFAZOLIN  (NON-URINE) 2 SENSITIVE Sensitive     CEFEPIME <=0.12 SENSITIVE Sensitive     ERTAPENEM <=0.12 SENSITIVE Sensitive     CEFTRIAXONE  <=0.25 SENSITIVE Sensitive     CIPROFLOXACIN <=0.06 SENSITIVE Sensitive  GENTAMICIN <=1 SENSITIVE Sensitive     MEROPENEM <=0.25 SENSITIVE Sensitive     TRIMETH/SULFA <=20 SENSITIVE Sensitive     AMPICILLIN/SULBACTAM 4 SENSITIVE Sensitive     PIP/TAZO Value in next row Sensitive      <=4 SENSITIVEThis is a modified FDA-approved test that has been validated and its performance characteristics determined by the reporting laboratory.  This laboratory is certified under the Clinical Laboratory Improvement Amendments CLIA as qualified to perform high complexity clinical laboratory testing.    * KLEBSIELLA PNEUMONIAE  Blood Culture ID Panel (Reflexed)     Status: Abnormal   Collection Time: 06/13/24  4:34 AM  Result Value Ref Range Status   Enterococcus faecalis NOT DETECTED NOT DETECTED Final   Enterococcus Faecium NOT DETECTED NOT DETECTED Final   Listeria monocytogenes NOT DETECTED NOT DETECTED Final   Staphylococcus species NOT DETECTED NOT DETECTED Final   Staphylococcus aureus (BCID) NOT DETECTED NOT DETECTED Final   Staphylococcus epidermidis NOT DETECTED NOT DETECTED Final   Staphylococcus lugdunensis NOT DETECTED NOT DETECTED Final   Streptococcus species NOT DETECTED NOT DETECTED Final   Streptococcus agalactiae NOT DETECTED NOT DETECTED Final   Streptococcus pneumoniae NOT DETECTED NOT  DETECTED Final   Streptococcus pyogenes NOT DETECTED NOT DETECTED Final   A.calcoaceticus-baumannii NOT DETECTED NOT DETECTED Final   Bacteroides fragilis NOT DETECTED NOT DETECTED Final   Enterobacterales DETECTED (A) NOT DETECTED Final    Comment: Enterobacterales represent a large order of gram negative bacteria, not a single organism. CRITICAL RESULT CALLED TO, READ BACK BY AND VERIFIED WITH: PHARMD EMERSON GLATTER [id number] @ 2140 FH    Enterobacter cloacae complex NOT DETECTED NOT DETECTED Final   Escherichia coli NOT DETECTED NOT DETECTED Final   Klebsiella aerogenes NOT DETECTED NOT DETECTED Final   Klebsiella oxytoca NOT DETECTED NOT DETECTED Final   Klebsiella pneumoniae DETECTED (A) NOT DETECTED Final    Comment: CRITICAL RESULT CALLED TO, READ BACK BY AND VERIFIED WITH: PHARMD EMERSON GLATTER [id number] @ 2140 FH    Proteus species NOT DETECTED NOT DETECTED Final   Salmonella species NOT DETECTED NOT DETECTED Final   Serratia marcescens NOT DETECTED NOT DETECTED Final   Haemophilus influenzae NOT DETECTED NOT DETECTED Final   Neisseria meningitidis NOT DETECTED NOT DETECTED Final   Pseudomonas aeruginosa NOT DETECTED NOT DETECTED Final   Stenotrophomonas maltophilia NOT DETECTED NOT DETECTED Final   Candida albicans NOT DETECTED NOT DETECTED Final   Candida auris NOT DETECTED NOT DETECTED Final   Candida glabrata NOT DETECTED NOT DETECTED Final   Candida krusei NOT DETECTED NOT DETECTED Final   Candida parapsilosis NOT DETECTED NOT DETECTED Final   Candida tropicalis NOT DETECTED NOT DETECTED Final   Cryptococcus neoformans/gattii NOT DETECTED NOT DETECTED Final   CTX-M ESBL NOT DETECTED NOT DETECTED Final   Carbapenem resistance IMP NOT DETECTED NOT DETECTED Final   Carbapenem resistance KPC NOT DETECTED NOT DETECTED Final   Carbapenem resistance NDM NOT DETECTED NOT DETECTED Final   Carbapenem resist OXA 48 LIKE NOT DETECTED NOT DETECTED Final   Carbapenem resistance VIM NOT  DETECTED NOT DETECTED Final    Comment: Performed at [facility], [address] [place], [place] [address]  Culture, blood (routine x 2)     Status: Abnormal   Collection Time: 06/13/24  4:39 AM   Specimen: BLOOD  Result Value Ref Range Status   Specimen Description BLOOD SITE NOT SPECIFIED  Final   Special Requests   Final  BOTTLES DRAWN AEROBIC AND ANAEROBIC Blood Culture adequate volume   Culture  Setup Time   Final    GRAM NEGATIVE RODS IN BOTH AEROBIC AND ANAEROBIC BOTTLES CRITICAL VALUE NOTED.  VALUE IS CONSISTENT WITH PREVIOUSLY REPORTED AND CALLED VALUE.    Culture (A)  Final    KLEBSIELLA PNEUMONIAE SUSCEPTIBILITIES PERFORMED ON PREVIOUS CULTURE WITHIN THE LAST 5 DAYS. Performed at [facility], [address], [place], [place] [address]    Report Status 06/15/2024 FINAL  Final  Urine Culture     Status: Abnormal   Collection Time: 06/13/24  5:59 AM   Specimen: Urine, Clean Catch  Result Value Ref Range Status   Specimen Description URINE, CLEAN CATCH  Final   Special Requests   Final    NONE Performed at [facility], [address], [place], [place] [address]    Culture >=100,000 COLONIES/mL KLEBSIELLA PNEUMONIAE (A)  Final   Report Status 06/15/2024 FINAL  Final   Organism ID, Bacteria KLEBSIELLA PNEUMONIAE (A)  Final      Susceptibility   Klebsiella pneumoniae - MIC*    AMPICILLIN >=32 RESISTANT Resistant     CEFAZOLIN  (URINE) Value in next row Sensitive      2 SENSITIVEThis is a modified FDA-approved test that has been validated and its performance characteristics determined by the reporting laboratory.  This laboratory is certified under the Clinical Laboratory Improvement Amendments CLIA as qualified to perform high complexity clinical laboratory testing.    CEFEPIME Value in next row Sensitive      2 SENSITIVEThis is a modified FDA-approved test that has been validated and its performance characteristics determined by the reporting  laboratory.  This laboratory is certified under the Clinical Laboratory Improvement Amendments CLIA as qualified to perform high complexity clinical laboratory testing.    ERTAPENEM Value in next row Sensitive      2 SENSITIVEThis is a modified FDA-approved test that has been validated and its performance characteristics determined by the reporting laboratory.  This laboratory is certified under the Clinical Laboratory Improvement Amendments CLIA as qualified to perform high complexity clinical laboratory testing.    CEFTRIAXONE  Value in next row Sensitive      2 SENSITIVEThis is a modified FDA-approved test that has been validated and its performance characteristics determined by the reporting laboratory.  This laboratory is certified under the Clinical Laboratory Improvement Amendments CLIA as qualified to perform high complexity clinical laboratory testing.    CIPROFLOXACIN Value in next row Sensitive      2 SENSITIVEThis is a modified FDA-approved test that has been validated and its performance characteristics determined by the reporting laboratory.  This laboratory is certified under the Clinical Laboratory Improvement Amendments CLIA as qualified to perform high complexity clinical laboratory testing.    GENTAMICIN Value in next row Sensitive      2 SENSITIVEThis is a modified FDA-approved test that has been validated and its performance characteristics determined by the reporting laboratory.  This laboratory is certified under the Clinical Laboratory Improvement Amendments CLIA as qualified to perform high complexity clinical laboratory testing.    NITROFURANTOIN Value in next row Intermediate      2 SENSITIVEThis is a modified FDA-approved test that has been validated and its performance characteristics determined by the reporting laboratory.  This laboratory is certified under the Clinical Laboratory Improvement Amendments CLIA as qualified to perform high complexity clinical laboratory testing.     TRIMETH/SULFA Value in next row Sensitive  2 SENSITIVEThis is a modified FDA-approved test that has been validated and its performance characteristics determined by the reporting laboratory.  This laboratory is certified under the Clinical Laboratory Improvement Amendments CLIA as qualified to perform high complexity clinical laboratory testing.    AMPICILLIN/SULBACTAM Value in next row Sensitive      2 SENSITIVEThis is a modified FDA-approved test that has been validated and its performance characteristics determined by the reporting laboratory.  This laboratory is certified under the Clinical Laboratory Improvement Amendments CLIA as qualified to perform high complexity clinical laboratory testing.    PIP/TAZO Value in next row Sensitive      <=4 SENSITIVEThis is a modified FDA-approved test that has been validated and its performance characteristics determined by the reporting laboratory.  This laboratory is certified under the Clinical Laboratory Improvement Amendments CLIA as qualified to perform high complexity clinical laboratory testing.    MEROPENEM Value in next row Sensitive      <=4 SENSITIVEThis is a modified FDA-approved test that has been validated and its performance characteristics determined by the reporting laboratory.  This laboratory is certified under the Clinical Laboratory Improvement Amendments CLIA as qualified to perform high complexity clinical laboratory testing.    * >=100,000 COLONIES/mL KLEBSIELLA PNEUMONIAE     Radiology Studies: No results found.   Scheduled Meds:  apixaban   5 mg Oral BID   cefadroxil   1,000 mg Oral BID   chlorpheniramine-HYDROcodone   5 mL Oral Q12H   Gerhardt's butt cream   Topical TID   guaiFENesin -dextromethorphan   15 mL Oral Q6H   insulin  aspart  0-20 Units Subcutaneous TID WC   insulin  aspart  0-5 Units Subcutaneous QHS   insulin  aspart  15 Units Subcutaneous TID WC   insulin  glargine  32 Units Subcutaneous BID   phenazopyridine    200 mg Oral TID WC   tamsulosin   0.4 mg Oral Daily   Continuous Infusions:     LOS: 3 days   Ivonne Mustache, MD [facility] P1/03/2025, 10:48 AM  "

## 2024-06-16 NOTE — Assessment & Plan Note (Signed)
 A1c elevated to 13, previously 7.2. Prednisone  may elevate blood sugar. Home readings high, around 300 mg/dL. Discussed Medicaid for medication and glucose monitoring access. - Advised to monitor blood sugar levels at home. - Discussed potential for Medicaid coverage to improve access to medications and glucose monitoring.

## 2024-06-17 ENCOUNTER — Other Ambulatory Visit (HOSPITAL_COMMUNITY): Payer: Self-pay

## 2024-06-17 LAB — COMPREHENSIVE METABOLIC PANEL WITH GFR
ALT: 523 U/L — ABNORMAL HIGH (ref 0–44)
AST: 103 U/L — ABNORMAL HIGH (ref 15–41)
Albumin: 2.6 g/dL — ABNORMAL LOW (ref 3.5–5.0)
Alkaline Phosphatase: 280 U/L — ABNORMAL HIGH (ref 38–126)
Anion gap: 9 (ref 5–15)
BUN: 7 mg/dL (ref 6–20)
CO2: 27 mmol/L (ref 22–32)
Calcium: 8.2 mg/dL — ABNORMAL LOW (ref 8.9–10.3)
Chloride: 97 mmol/L — ABNORMAL LOW (ref 98–111)
Creatinine, Ser: 0.49 mg/dL — ABNORMAL LOW (ref 0.61–1.24)
GFR, Estimated: >60 mL/min
Glucose, Bld: 300 mg/dL — ABNORMAL HIGH (ref 70–99)
Potassium: 3.4 mmol/L — ABNORMAL LOW (ref 3.5–5.1)
Sodium: 133 mmol/L — ABNORMAL LOW (ref 135–145)
Total Bilirubin: 7.4 mg/dL — ABNORMAL HIGH (ref 0.0–1.2)
Total Protein: 5.4 g/dL — ABNORMAL LOW (ref 6.5–8.1)

## 2024-06-17 LAB — GLUCOSE, CAPILLARY
Glucose-Capillary: 240 mg/dL — ABNORMAL HIGH (ref 70–99)
Glucose-Capillary: 213 mg/dL — ABNORMAL HIGH (ref 70–99)

## 2024-06-17 MED ORDER — GUAIFENESIN-DM 100-10 MG/5ML PO SYRP
15.0000 mL | ORAL_SOLUTION | Freq: Four times a day (QID) | ORAL | 0 refills | Status: DC | PRN
  Filled 2024-06-17: qty 237, 4d supply, fill #0

## 2024-06-17 MED ORDER — INSULIN LISPRO (1 UNIT DIAL) 100 UNIT/ML (KWIKPEN)
15.0000 [IU] | PEN_INJECTOR | Freq: Three times a day (TID) | SUBCUTANEOUS | 0 refills | Status: DC
  Filled 2024-06-17: qty 15, 30d supply, fill #0

## 2024-06-17 MED ORDER — LANCET DEVICE MISC
1.0000 | 0 refills | Status: AC
  Filled 2024-06-17: qty 1, fill #0

## 2024-06-17 MED ORDER — GERHARDT'S BUTT CREAM
1.0000 | TOPICAL_CREAM | Freq: Three times a day (TID) | CUTANEOUS | 0 refills | Status: AC
  Filled 2024-06-17: qty 60, 30d supply, fill #0

## 2024-06-17 MED ORDER — CEFADROXIL 500 MG PO CAPS
1000.0000 mg | ORAL_CAPSULE | Freq: Two times a day (BID) | ORAL | 0 refills | Status: AC
  Filled 2024-06-17: qty 24, 6d supply, fill #0

## 2024-06-17 MED ORDER — BASAGLAR KWIKPEN 100 UNIT/ML ~~LOC~~ SOPN
32.0000 [IU] | PEN_INJECTOR | Freq: Every day | SUBCUTANEOUS | 0 refills | Status: DC
  Filled 2024-06-17: qty 12, 30d supply, fill #0

## 2024-06-17 MED ORDER — INSULIN PEN NEEDLE 32G X 4 MM MISC
1.0000 | 0 refills | Status: DC
  Filled 2024-06-17: qty 100, 30d supply, fill #0

## 2024-06-17 MED ORDER — ACCU-CHEK SOFTCLIX LANCETS MISC
1.0000 | 0 refills | Status: DC
  Filled 2024-06-17: qty 100, 30d supply, fill #0

## 2024-06-17 MED ORDER — HYDROCOD POLI-CHLORPHE POLI ER 10-8 MG/5ML PO SUER
10.0000 mL | Freq: Two times a day (BID) | ORAL | 0 refills | Status: DC
  Filled 2024-06-17: qty 100, 5d supply, fill #0

## 2024-06-17 MED ORDER — BLOOD GLUCOSE MONITOR SYSTEM W/DEVICE KIT
1.0000 | PACK | 0 refills | Status: DC
  Filled 2024-06-17: qty 1, 30d supply, fill #0

## 2024-06-17 MED ORDER — PHENAZOPYRIDINE HCL 100 MG PO TABS
200.0000 mg | ORAL_TABLET | Freq: Three times a day (TID) | ORAL | 0 refills | Status: AC
  Filled 2024-06-17: qty 20, 4d supply, fill #0

## 2024-06-17 MED ORDER — POTASSIUM CHLORIDE CRYS ER 20 MEQ PO TBCR
40.0000 meq | EXTENDED_RELEASE_TABLET | Freq: Every day | ORAL | 0 refills | Status: AC
  Filled 2024-06-17: qty 6, 3d supply, fill #0

## 2024-06-17 MED ORDER — BLOOD GLUCOSE TEST VI STRP
1.0000 | ORAL_STRIP | 0 refills | Status: DC
  Filled 2024-06-17: qty 100, 30d supply, fill #0

## 2024-06-17 MED ORDER — POTASSIUM CHLORIDE CRYS ER 20 MEQ PO TBCR
40.0000 meq | EXTENDED_RELEASE_TABLET | Freq: Once | ORAL | Status: AC
  Administered 2024-06-17: 40 meq via ORAL
  Filled 2024-06-17: qty 2

## 2024-06-17 NOTE — Discharge Summary (Addendum)
 Physician Discharge Summary  Larry Fowler FMW:[id number] DOB: 16-Jun-1970 DOA: 06/12/2024  PCP: Alvia Corean CROME, FNP  Admit date: 06/12/2024 Discharge date: 06/17/2024  Admitted From: Home Disposition:  Home  Discharge Condition:Stable CODE STATUS:FULL Diet recommendation:  Carb Modified  Brief/Interim Summary: Larry Fowler is a 54 y.o. male with medical history significant of diabetes type 2, paroxysmal A-fib, BPH, morbid obesity who presented with complaint of being weak, dehydrated, poor oral intake.  He was recently diagnosed with flu on December 26 and he dad developed fever, cough, body aches, sore throat.  Flu swab was positive for that time.  He reported persistent fever.  Lab work showed elevated liver enzymes, UA was suspicious for UTI, lactic acid was elevated.  Blood cultures showed  Klebsiella in both sets.  Sepsis physiology has  improved.  Liver enzymes significantly improved.  Medically stable for discharge home today with oral antibiotics.  He has been started on insulin  regimen.  Following problems were addressed during the hospitalization:   Severe sepsis secondary to UTI/pyelonephritis/Klebsiella bacteremia: Presented with generalized weakness, mild -grade fever, sinus tachycardia.  Lab work showed elevated lactate.  UA suspicious for UTI.  CT abdomen/pelvis shows possible right-sided pyelonephritis with nonobstructing stone. Blood cultures/urine culture showed Klebsiella.  Afebrile this morning.  Lactic acidosis resolved.  Antibiotics changed to oral.   Elevated liver enzymes: Unclear etiology.  Likely from sepsis or viral versus drug-induced.  ASMA negative ,ANA positive.  Negative CMV/IgG positive for EBV.  Hepatitis panel negative.  Right upper quadrant ultrasound does not show any signs of cholecystitis or obstruction.  Tylenol  level less than 10.  Liver enzymes improving, bilirubin improving.  Check CMP in a week   Uncontrolled diabetes type 2: A1c of 13.3.   Only takes pioglitazone  at home.  Diabetic coordinator consulted.  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  Currently on room air.   Hyponatremia: Sodium in the range of 120s on presentation this is most likely from pseudohyponatremia from hyperglycemia.  Much better now   Renal stone: CT imaging on presentation showed right lower pole nonobstructive stone of 10 x 8 mm.  He needs to follow-up with [facility] on discharge   Morbid obesity: BMI 56   OSA: Does not use CPAP   Deconditioning: PT consulted, outpatient follow-up recommended  Discharge Diagnoses:  Principal Problem:   Sepsis (HCC) Active Problems:   Morbid obesity (HCC)   Renal stone   OSA (obstructive sleep apnea)   Diabetes (HCC)   Uncontrolled type 2 diabetes mellitus with hyperglycemia (HCC)   Saddle pulmonary embolus (HCC)   History of pulmonary embolism   Elevated LFTs   Acute hepatitis   Shock liver   Pyelonephritis   Bacteremia due to Klebsiella pneumoniae    Discharge Instructions  Discharge Instructions     Discharge instructions   Complete by: As directed    1)Please take your medications as instructed 2)Follow up with your PCP in a week.  Do a CMP test to check your liver enzymes during the follow-up 3)Monitor your blood sugars at home. 4)Follow up with urology as an outpatient.  Name and number the provider group has been attached.   Increase activity slowly   Complete by: As directed       Allergies as of 06/17/2024       Reactions   Metformin  And Related Diarrhea, Nausea Only  Medication List     STOP taking these medications    fluconazole  150 MG tablet Commonly known as: DIFLUCAN    fluticasone -salmeterol 250-50 MCG/ACT Aepb Commonly known as: Wixela Inhub       TAKE these medications    Accu-Chek Softclix Lancets lancets 1 each as directed. Dispense based on patient  and insurance preference. Use up to four times daily as directed. (FOR ICD-10 E10.9, E11.9).   albuterol  108 (90 Base) MCG/ACT inhaler Commonly known as: VENTOLIN  HFA Inhale 2 puffs into the lungs every 6 (six) hours as needed for wheezing or shortness of breath.   apixaban  5 MG Tabs tablet Commonly known as: ELIQUIS  Take 2 tablets (10 mg total) by mouth 2 (two) times daily for 6 days THEN take 1 tablet (5 mg total) by mouth 2 (two) times daily thereafter.   Basaglar  KwikPen 100 UNIT/ML Inject 32 Units into the skin daily. May substitute as needed per insurance.   BENADRYL  MAXIMUM STRENGTH EX Apply 1 application  topically 2 (two) times daily as needed (irritation of perineum).   benzonatate  200 MG capsule Commonly known as: TESSALON  Take 1 capsule (200 mg total) by mouth 3 (three) times daily as needed for cough.   Blood Glucose Monitor System w/Device Kit 1 each by Does not apply route as directed. Dispense based on patient and insurance preference. Use up to four times daily as directed. (FOR ICD-10 E10.9, E11.9).   BLOOD GLUCOSE TEST STRIPS Strp 1 each by Does not apply route as directed. Dispense based on patient and insurance preference. Use up to four times daily as directed. (FOR ICD-10 E10.9, E11.9).   cefadroxil  500 MG capsule Commonly known as: DURICEF Take 2 capsules (1,000 mg total) by mouth 2 (two) times daily for 6 days.   Clotrimazole  Anti-Fungal 1 % cream Generic drug: clotrimazole  Apply to affected area 2 times daily What changed:  how much to take how to take this when to take this reasons to take this   cyclobenzaprine  10 MG tablet Commonly known as: FLEXERIL  Take 1 tablet (10 mg total) by mouth 3 (three) times daily as needed for muscle spasms.   Gerhardt's butt cream Crea Apply 1 Application topically 3 (three) times daily.   guaiFENesin -dextromethorphan  100-10 MG/5ML syrup Commonly known as: ROBITUSSIN DM Take 15 mLs by mouth every 6 (six) hours  as needed for cough.   HYDROcodone -acetaminophen  5-325 MG tablet Commonly known as: NORCO/VICODIN Take 1 tablet by mouth every 6 (six) hours as needed for moderate pain (pain score 4-6) or severe pain (pain score 7-10).   insulin  lispro 100 UNIT/ML KwikPen Commonly known as: HUMALOG  Inject 15 Units into the skin 3 (three) times daily. If eating and Blood Glucose (BG) 80 or higher inject 15 units for meal coverage and add correction dose per scale. If not eating, correction dose only. BG <150= 0 unit; BG 150-200= 1 unit; BG 201-250= 2 unit; BG 251-300= 3 unit; BG 301-350= 4 unit; BG 351-400= 5 unit; BG >400= 6 unit and Call Primary Care.   Insulin  Pen Needle 32G X 4 MM Misc 1 each by Does not apply route as directed. Dispense based on patient and insurance preference. Use up to four times daily as directed. (FOR ICD-10 E10.9, E11.9).   Lancet Device Misc 1 each by Does not apply route as directed. Dispense based on patient and insurance preference. Use up to four times daily as directed. (FOR ICD-10 E10.9, E11.9).   phenazopyridine  100 MG tablet Commonly known as: PYRIDIUM   Take 2 tablets (200 mg total) by mouth 3 (three) times daily with meals.   pioglitazone  30 MG tablet Commonly known as: Actos  Take 1 tablet (30 mg total) by mouth daily.   potassium chloride  SA 20 MEQ tablet Commonly known as: KLOR-CON  M Take 2 tablets (40 mEq total) by mouth daily. Start taking on: June 18, 2024   tamsulosin  0.4 MG Caps capsule Commonly known as: FLOMAX  Take 1 capsule (0.4 mg total) by mouth daily.        Follow-up Information     Alvia Corean CROME, FNP. Schedule an appointment as soon as possible for a visit in 1 week(s).   Specialty: Family Medicine Contact information: [address] [place] [address] [phone]         [facility]. Schedule an appointment as soon as possible for a visit in 1 week(s).   Contact information: [address]  [address] [place]  [address] [phone]               Allergies[1]  Consultations: GI   Procedures/Studies: CT ABDOMEN PELVIS W CONTRAST Result Date: 06/13/2024 CLINICAL DATA:  Abdominal pain.  Dehydration.  CT. EXAM: CT ABDOMEN AND PELVIS WITH CONTRAST TECHNIQUE: Multidetector CT imaging of the abdomen and pelvis was performed using the standard protocol following bolus administration of intravenous contrast. RADIATION DOSE REDUCTION: This exam was performed according to the departmental dose-optimization program which includes automated exposure control, adjustment of the mA and/or kV according to patient size and/or use of iterative reconstruction technique. CONTRAST:  OMNIPAQUE  IOHEXOL  350 MG/ML SOLN COMPARISON:  04/05/2024 FINDINGS: Lower chest: No acute findings. Hepatobiliary: No suspicious focal abnormality within the liver parenchyma. Gallbladder is decompressed. No intrahepatic or extrahepatic biliary dilation. Pancreas: No focal mass lesion. No dilatation of the main duct. No intraparenchymal cyst. No peripancreatic edema. Spleen: No splenomegaly. No suspicious focal mass lesion. Adrenals/Urinary Tract: No adrenal nodule or mass. 10 x 8 mm nonobstructing stone identified lower pole right kidney. There is subtle right perinephric edema and a suggestion of segmental hypoperfusion in the right kidney (see posterolateral interpolar right kidney on 41/2 and lower pole right kidney on 51/2). Left kidney unremarkable. No evidence for hydroureter. The urinary bladder appears normal for the degree of distention. Stomach/Bowel: Tiny hiatal hernia. Stomach is decompressed. Duodenum is normally positioned as is the ligament of Treitz. No small bowel wall thickening. No small bowel dilatation. The terminal ileum is normal. The appendix is normal. No gross colonic mass. No colonic wall thickening. Vascular/Lymphatic: No abdominal aortic aneurysm. No abdominal aortic  atherosclerotic calcification. There is no gastrohepatic or hepatoduodenal ligament lymphadenopathy. No retroperitoneal or mesenteric lymphadenopathy. No pelvic sidewall lymphadenopathy. Reproductive: The prostate gland and seminal vesicles are unremarkable. Other: No substantial intraperitoneal free fluid. Musculoskeletal: No worrisome lytic or sclerotic osseous abnormality. IMPRESSION: 1. Subtle right perinephric edema with a suggestion of segmental hypoperfusion in the right kidney. Imaging features suspicious for pyelonephritis. 2. Associated 10 x 8 mm nonobstructing stone lower pole right kidney. 3. Tiny hiatal hernia. Electronically Signed   By: Camellia Candle M.D.   On: 06/13/2024 07:01   US  Abdomen Limited RUQ (LIVER/GB) Result Date: 06/13/2024 CLINICAL DATA:  Abdominal pain. EXAM: ULTRASOUND ABDOMEN LIMITED RIGHT UPPER QUADRANT COMPARISON:  CT scan 04/05/2024 FINDINGS: Gallbladder: Gallbladder is incompletely distended which can decrease sensitivity for the detection of gallstones. Within this limitation, no gallstones evident. Gallbladder wall thickness is upper normal at 3.1 mm and likely accentuated by underdistention.  Sonographer reports no sonographic Murphy sign. Common bile duct: Diameter: 3 mm Liver: Liver is diffusely echogenic suggesting underlying fatty deposition. Portal vein is patent on color Doppler imaging with normal direction of blood flow towards the liver. Other: None. IMPRESSION: 1. Gallbladder is incompletely distended which can decrease sensitivity for the detection of gallstones. Within this limitation, no gallstones evident. Gallbladder wall thickness is upper normal at 3.1 mm and likely accentuated by underdistention. No sonographic Murphy sign. 2. Hepatic steatosis. Electronically Signed   By: Camellia Candle M.D.   On: 06/13/2024 05:51   DG Chest 1 View Result Date: 06/12/2024 CLINICAL DATA:  Cough. EXAM: CHEST  1 VIEW COMPARISON:  06/26/2022 FINDINGS: Both lungs are clear. No  focal airspace disease or pulmonary edema. Heart size is normal. The trachea is midline. Negative for a pneumothorax. No acute bone abnormality. IMPRESSION: No active disease. Electronically Signed   By: Juliene Balder M.D.   On: 06/12/2024 15:54      Subjective: Patient seen and examined at bedside today.  Hemodynamically stable.  Feels very good today.  Has some dry cough.  Denies any shortness of breath.  Medically stable for discharge home today.  Discharge Exam: Vitals:   06/16/24 2024 06/17/24 0454  BP: 136/72 113/63  Pulse: 80 77  Resp: 18 18  Temp: 99.1 F (37.3 C) 98.6 F (37 C)  SpO2: 98% 93%   Vitals:   06/16/24 1329 06/16/24 1641 06/16/24 2024 06/17/24 0454  BP: (!) 112/57 134/74 136/72 113/63  Pulse: 73 71 80 77  Resp: 20 18 18 18   Temp: 98.1 F (36.7 C) 98.5 F (36.9 C) 99.1 F (37.3 C) 98.6 F (37 C)  TempSrc: Oral  Oral   SpO2:  95% 98% 93%  Weight: (!) 200 kg     Height: 6' 2 (1.88 m)       General: Pt is alert, awake, not in acute distress, morbidly obese Cardiovascular: RRR, S1/S2 +, no rubs, no gallops Respiratory: CTA bilaterally, no wheezing, no rhonchi Abdominal: Soft, NT, ND, bowel sounds + Extremities: no edema, no cyanosis    The results of significant diagnostics from this hospitalization (including imaging, microbiology, ancillary and laboratory) are listed below for reference.     Microbiology: Recent Results (from the past 240 hours)  Culture, blood (routine x 2)     Status: Abnormal   Collection Time: 06/13/24  4:34 AM   Specimen: BLOOD  Result Value Ref Range Status   Specimen Description BLOOD SITE NOT SPECIFIED  Final   Special Requests   Final    BOTTLES DRAWN AEROBIC AND ANAEROBIC Blood Culture results may not be optimal due to an inadequate volume of blood received in culture bottles   Culture  Setup Time   Final    GRAM NEGATIVE RODS IN BOTH AEROBIC AND ANAEROBIC BOTTLES CRITICAL RESULT CALLED TO, READ BACK BY AND VERIFIED  WITH: MAYA EMERSON GLATTER [id number] @ 2140 FH Performed at [facility], [address] [place], [place] [address]    Culture KLEBSIELLA PNEUMONIAE (A)  Final   Report Status 06/15/2024 FINAL  Final   Organism ID, Bacteria KLEBSIELLA PNEUMONIAE  Final      Susceptibility   Klebsiella pneumoniae - MIC*    AMPICILLIN >=32 RESISTANT Resistant     CEFAZOLIN  (NON-URINE) 2 SENSITIVE Sensitive     CEFEPIME <=0.12 SENSITIVE Sensitive     ERTAPENEM <=0.12 SENSITIVE Sensitive     CEFTRIAXONE  <=0.25 SENSITIVE Sensitive  CIPROFLOXACIN <=0.06 SENSITIVE Sensitive     GENTAMICIN <=1 SENSITIVE Sensitive     MEROPENEM <=0.25 SENSITIVE Sensitive     TRIMETH/SULFA <=20 SENSITIVE Sensitive     AMPICILLIN/SULBACTAM 4 SENSITIVE Sensitive     PIP/TAZO Value in next row Sensitive      <=4 SENSITIVEThis is a modified FDA-approved test that has been validated and its performance characteristics determined by the reporting laboratory.  This laboratory is certified under the Clinical Laboratory Improvement Amendments CLIA as qualified to perform high complexity clinical laboratory testing.    * KLEBSIELLA PNEUMONIAE  Blood Culture ID Panel (Reflexed)     Status: Abnormal   Collection Time: 06/13/24  4:34 AM  Result Value Ref Range Status   Enterococcus faecalis NOT DETECTED NOT DETECTED Final   Enterococcus Faecium NOT DETECTED NOT DETECTED Final   Listeria monocytogenes NOT DETECTED NOT DETECTED Final   Staphylococcus species NOT DETECTED NOT DETECTED Final   Staphylococcus aureus (BCID) NOT DETECTED NOT DETECTED Final   Staphylococcus epidermidis NOT DETECTED NOT DETECTED Final   Staphylococcus lugdunensis NOT DETECTED NOT DETECTED Final   Streptococcus species NOT DETECTED NOT DETECTED Final   Streptococcus agalactiae NOT DETECTED NOT DETECTED Final   Streptococcus pneumoniae NOT DETECTED NOT DETECTED Final   Streptococcus pyogenes NOT DETECTED NOT DETECTED Final   A.calcoaceticus-baumannii NOT  DETECTED NOT DETECTED Final   Bacteroides fragilis NOT DETECTED NOT DETECTED Final   Enterobacterales DETECTED (A) NOT DETECTED Final    Comment: Enterobacterales represent a large order of gram negative bacteria, not a single organism. CRITICAL RESULT CALLED TO, READ BACK BY AND VERIFIED WITH: PHARMD EMERSON GLATTER [id number] @ 2140 FH    Enterobacter cloacae complex NOT DETECTED NOT DETECTED Final   Escherichia coli NOT DETECTED NOT DETECTED Final   Klebsiella aerogenes NOT DETECTED NOT DETECTED Final   Klebsiella oxytoca NOT DETECTED NOT DETECTED Final   Klebsiella pneumoniae DETECTED (A) NOT DETECTED Final    Comment: CRITICAL RESULT CALLED TO, READ BACK BY AND VERIFIED WITH: PHARMD EMERSON GLATTER [id number] @ 2140 FH    Proteus species NOT DETECTED NOT DETECTED Final   Salmonella species NOT DETECTED NOT DETECTED Final   Serratia marcescens NOT DETECTED NOT DETECTED Final   Haemophilus influenzae NOT DETECTED NOT DETECTED Final   Neisseria meningitidis NOT DETECTED NOT DETECTED Final   Pseudomonas aeruginosa NOT DETECTED NOT DETECTED Final   Stenotrophomonas maltophilia NOT DETECTED NOT DETECTED Final   Candida albicans NOT DETECTED NOT DETECTED Final   Candida auris NOT DETECTED NOT DETECTED Final   Candida glabrata NOT DETECTED NOT DETECTED Final   Candida krusei NOT DETECTED NOT DETECTED Final   Candida parapsilosis NOT DETECTED NOT DETECTED Final   Candida tropicalis NOT DETECTED NOT DETECTED Final   Cryptococcus neoformans/gattii NOT DETECTED NOT DETECTED Final   CTX-M ESBL NOT DETECTED NOT DETECTED Final   Carbapenem resistance IMP NOT DETECTED NOT DETECTED Final   Carbapenem resistance KPC NOT DETECTED NOT DETECTED Final   Carbapenem resistance NDM NOT DETECTED NOT DETECTED Final   Carbapenem resist OXA 48 LIKE NOT DETECTED NOT DETECTED Final   Carbapenem resistance VIM NOT DETECTED NOT DETECTED Final    Comment: Performed at [facility], [address], [place], [place]  [address]  Culture, blood (routine x 2)     Status: Abnormal   Collection Time: 06/13/24  4:39 AM   Specimen: BLOOD  Result Value Ref Range Status   Specimen Description BLOOD SITE NOT SPECIFIED  Final  Special Requests   Final    BOTTLES DRAWN AEROBIC AND ANAEROBIC Blood Culture adequate volume   Culture  Setup Time   Final    GRAM NEGATIVE RODS IN BOTH AEROBIC AND ANAEROBIC BOTTLES CRITICAL VALUE NOTED.  VALUE IS CONSISTENT WITH PREVIOUSLY REPORTED AND CALLED VALUE.    Culture (A)  Final    KLEBSIELLA PNEUMONIAE SUSCEPTIBILITIES PERFORMED ON PREVIOUS CULTURE WITHIN THE LAST 5 DAYS. Performed at [facility], [address] [place], [place] [address]    Report Status 06/15/2024 FINAL  Final  Urine Culture     Status: Abnormal   Collection Time: 06/13/24  5:59 AM   Specimen: Urine, Clean Catch  Result Value Ref Range Status   Specimen Description URINE, CLEAN CATCH  Final   Special Requests   Final    NONE Performed at [facility], [address], [place], [place] [address]    Culture >=100,000 COLONIES/mL KLEBSIELLA PNEUMONIAE (A)  Final   Report Status 06/15/2024 FINAL  Final   Organism ID, Bacteria KLEBSIELLA PNEUMONIAE (A)  Final      Susceptibility   Klebsiella pneumoniae - MIC*    AMPICILLIN >=32 RESISTANT Resistant     CEFAZOLIN  (URINE) Value in next row Sensitive      2 SENSITIVEThis is a modified FDA-approved test that has been validated and its performance characteristics determined by the reporting laboratory.  This laboratory is certified under the Clinical Laboratory Improvement Amendments CLIA as qualified to perform high complexity clinical laboratory testing.    CEFEPIME Value in next row Sensitive      2 SENSITIVEThis is a modified FDA-approved test that has been validated and its performance characteristics determined by the reporting laboratory.  This laboratory is certified under the Clinical Laboratory Improvement Amendments CLIA as qualified to  perform high complexity clinical laboratory testing.    ERTAPENEM Value in next row Sensitive      2 SENSITIVEThis is a modified FDA-approved test that has been validated and its performance characteristics determined by the reporting laboratory.  This laboratory is certified under the Clinical Laboratory Improvement Amendments CLIA as qualified to perform high complexity clinical laboratory testing.    CEFTRIAXONE  Value in next row Sensitive      2 SENSITIVEThis is a modified FDA-approved test that has been validated and its performance characteristics determined by the reporting laboratory.  This laboratory is certified under the Clinical Laboratory Improvement Amendments CLIA as qualified to perform high complexity clinical laboratory testing.    CIPROFLOXACIN Value in next row Sensitive      2 SENSITIVEThis is a modified FDA-approved test that has been validated and its performance characteristics determined by the reporting laboratory.  This laboratory is certified under the Clinical Laboratory Improvement Amendments CLIA as qualified to perform high complexity clinical laboratory testing.    GENTAMICIN Value in next row Sensitive      2 SENSITIVEThis is a modified FDA-approved test that has been validated and its performance characteristics determined by the reporting laboratory.  This laboratory is certified under the Clinical Laboratory Improvement Amendments CLIA as qualified to perform high complexity clinical laboratory testing.    NITROFURANTOIN Value in next row Intermediate      2 SENSITIVEThis is a modified FDA-approved test that has been validated and its performance characteristics determined by the reporting laboratory.  This laboratory is certified under the Clinical Laboratory Improvement Amendments CLIA as qualified to perform high complexity clinical laboratory testing.    TRIMETH/SULFA Value in next  row Sensitive      2 SENSITIVEThis is a modified FDA-approved test that has been  validated and its performance characteristics determined by the reporting laboratory.  This laboratory is certified under the Clinical Laboratory Improvement Amendments CLIA as qualified to perform high complexity clinical laboratory testing.    AMPICILLIN/SULBACTAM Value in next row Sensitive      2 SENSITIVEThis is a modified FDA-approved test that has been validated and its performance characteristics determined by the reporting laboratory.  This laboratory is certified under the Clinical Laboratory Improvement Amendments CLIA as qualified to perform high complexity clinical laboratory testing.    PIP/TAZO Value in next row Sensitive      <=4 SENSITIVEThis is a modified FDA-approved test that has been validated and its performance characteristics determined by the reporting laboratory.  This laboratory is certified under the Clinical Laboratory Improvement Amendments CLIA as qualified to perform high complexity clinical laboratory testing.    MEROPENEM Value in next row Sensitive      <=4 SENSITIVEThis is a modified FDA-approved test that has been validated and its performance characteristics determined by the reporting laboratory.  This laboratory is certified under the Clinical Laboratory Improvement Amendments CLIA as qualified to perform high complexity clinical laboratory testing.    * >=100,000 COLONIES/mL KLEBSIELLA PNEUMONIAE     Labs: BNP (last 3 results) No results for input(s): BNP in the last 8760 hours. Basic Metabolic Panel: Recent Labs  Lab 06/13/24 0800 06/14/24 0442 06/15/24 0245 06/16/24 0241 06/17/24 0252  NA 122* 125* 130* 133* 133*  K 4.4 3.8 3.7 3.4* 3.4*  CL 87* 91* 96* 98 97*  CO2 21* 24 25 27 27   GLUCOSE 447* 331* 305* 279* 300*  BUN 16 16 11 9 7   CREATININE 0.55* 0.62 0.60* 0.39* 0.49*  CALCIUM 8.5* 8.5* 8.5* 8.1* 8.2*   Liver Function Tests: Recent Labs  Lab 06/13/24 0800 06/14/24 0442 06/15/24 0245 06/16/24 0241 06/17/24 0252  AST 1,249* 479*  226* 131* 103*  ALT 2,842* 1,814* 1,205* 724* 523*  ALKPHOS 214* 212* 229* 213* 280*  BILITOT 9.7* 10.9* 10.8* 8.2* 7.4*  PROT 5.7* 5.6* 5.6* 5.0* 5.4*  ALBUMIN 3.1* 3.0* 2.9* 2.5* 2.6*   Recent Labs  Lab 06/12/24 1455  LIPASE 33   No results for input(s): AMMONIA in the last 168 hours. CBC: Recent Labs  Lab 06/12/24 1455 06/14/24 0442  WBC 7.9 7.8  NEUTROABS 6.2  --   HGB 15.0 13.5  HCT 44.8 39.5  MCV 92.4 89.8  PLT 132* 118*   Cardiac Enzymes: No results for input(s): CKTOTAL, CKMB, CKMBINDEX, TROPONINI in the last 168 hours. BNP: Invalid input(s): POCBNP CBG: Recent Labs  Lab 06/16/24 0758 06/16/24 1212 06/16/24 1657 06/16/24 2021 06/17/24 0743  GLUCAP 254* 236* 157* 148* 240*   D-Dimer No results for input(s): DDIMER in the last 72 hours. Hgb A1c No results for input(s): HGBA1C in the last 72 hours. Lipid Profile No results for input(s): CHOL, HDL, LDLCALC, TRIG, CHOLHDL, LDLDIRECT in the last 72 hours. Thyroid  function studies No results for input(s): TSH, T4TOTAL, T3FREE, THYROIDAB in the last 72 hours.  Invalid input(s): FREET3 Anemia work up No results for input(s): VITAMINB12, FOLATE, FERRITIN, TIBC, IRON , RETICCTPCT in the last 72 hours. Urinalysis    Component Value Date/Time   COLORURINE AMBER (A) 06/12/2024 1423   APPEARANCEUR HAZY (A) 06/12/2024 1423   APPEARANCEUR Clear 04/04/2024 0000   LABSPEC 1.020 06/12/2024 1423   PHURINE 6.0 06/12/2024 1423   GLUCOSEU >=500 (  A) 06/12/2024 1423   GLUCOSEU 100 (A) 12/26/2018 1522   HGBUR MODERATE (A) 06/12/2024 1423   BILIRUBINUR NEGATIVE 06/12/2024 1423   BILIRUBINUR Negative 04/04/2024 0000   KETONESUR 20 (A) 06/12/2024 1423   PROTEINUR 30 (A) 06/12/2024 1423   UROBILINOGEN 0.2 03/23/2024 1640   UROBILINOGEN 0.2 12/26/2018 1522   NITRITE NEGATIVE 06/12/2024 1423   LEUKOCYTESUR MODERATE (A) 06/12/2024 1423   Sepsis Labs Recent Labs  Lab  06/12/24 1455 06/14/24 0442  WBC 7.9 7.8   Microbiology Recent Results (from the past 240 hours)  Culture, blood (routine x 2)     Status: Abnormal   Collection Time: 06/13/24  4:34 AM   Specimen: BLOOD  Result Value Ref Range Status   Specimen Description BLOOD SITE NOT SPECIFIED  Final   Special Requests   Final    BOTTLES DRAWN AEROBIC AND ANAEROBIC Blood Culture results may not be optimal due to an inadequate volume of blood received in culture bottles   Culture  Setup Time   Final    GRAM NEGATIVE RODS IN BOTH AEROBIC AND ANAEROBIC BOTTLES CRITICAL RESULT CALLED TO, READ BACK BY AND VERIFIED WITH: MAYA EMERSON GLATTER [id number] @ 2140 FH Performed at [facility], [address], [place], [place] [address]    Culture KLEBSIELLA PNEUMONIAE (A)  Final   Report Status 06/15/2024 FINAL  Final   Organism ID, Bacteria KLEBSIELLA PNEUMONIAE  Final      Susceptibility   Klebsiella pneumoniae - MIC*    AMPICILLIN >=32 RESISTANT Resistant     CEFAZOLIN  (NON-URINE) 2 SENSITIVE Sensitive     CEFEPIME <=0.12 SENSITIVE Sensitive     ERTAPENEM <=0.12 SENSITIVE Sensitive     CEFTRIAXONE  <=0.25 SENSITIVE Sensitive     CIPROFLOXACIN <=0.06 SENSITIVE Sensitive     GENTAMICIN <=1 SENSITIVE Sensitive     MEROPENEM <=0.25 SENSITIVE Sensitive     TRIMETH/SULFA <=20 SENSITIVE Sensitive     AMPICILLIN/SULBACTAM 4 SENSITIVE Sensitive     PIP/TAZO Value in next row Sensitive      <=4 SENSITIVEThis is a modified FDA-approved test that has been validated and its performance characteristics determined by the reporting laboratory.  This laboratory is certified under the Clinical Laboratory Improvement Amendments CLIA as qualified to perform high complexity clinical laboratory testing.    * KLEBSIELLA PNEUMONIAE  Blood Culture ID Panel (Reflexed)     Status: Abnormal   Collection Time: 06/13/24  4:34 AM  Result Value Ref Range Status   Enterococcus faecalis NOT DETECTED NOT DETECTED Final    Enterococcus Faecium NOT DETECTED NOT DETECTED Final   Listeria monocytogenes NOT DETECTED NOT DETECTED Final   Staphylococcus species NOT DETECTED NOT DETECTED Final   Staphylococcus aureus (BCID) NOT DETECTED NOT DETECTED Final   Staphylococcus epidermidis NOT DETECTED NOT DETECTED Final   Staphylococcus lugdunensis NOT DETECTED NOT DETECTED Final   Streptococcus species NOT DETECTED NOT DETECTED Final   Streptococcus agalactiae NOT DETECTED NOT DETECTED Final   Streptococcus pneumoniae NOT DETECTED NOT DETECTED Final   Streptococcus pyogenes NOT DETECTED NOT DETECTED Final   A.calcoaceticus-baumannii NOT DETECTED NOT DETECTED Final   Bacteroides fragilis NOT DETECTED NOT DETECTED Final   Enterobacterales DETECTED (A) NOT DETECTED Final    Comment: Enterobacterales represent a large order of gram negative bacteria, not a single organism. CRITICAL RESULT CALLED TO, READ BACK BY AND VERIFIED WITH: PHARMD EMERSON GLATTER [id number] @ 2140 FH    Enterobacter cloacae complex NOT DETECTED NOT DETECTED Final   Escherichia coli  NOT DETECTED NOT DETECTED Final   Klebsiella aerogenes NOT DETECTED NOT DETECTED Final   Klebsiella oxytoca NOT DETECTED NOT DETECTED Final   Klebsiella pneumoniae DETECTED (A) NOT DETECTED Final    Comment: CRITICAL RESULT CALLED TO, READ BACK BY AND VERIFIED WITH: PHARMD EMERSON GLATTER [id number] @ 2140 FH    Proteus species NOT DETECTED NOT DETECTED Final   Salmonella species NOT DETECTED NOT DETECTED Final   Serratia marcescens NOT DETECTED NOT DETECTED Final   Haemophilus influenzae NOT DETECTED NOT DETECTED Final   Neisseria meningitidis NOT DETECTED NOT DETECTED Final   Pseudomonas aeruginosa NOT DETECTED NOT DETECTED Final   Stenotrophomonas maltophilia NOT DETECTED NOT DETECTED Final   Candida albicans NOT DETECTED NOT DETECTED Final   Candida auris NOT DETECTED NOT DETECTED Final   Candida glabrata NOT DETECTED NOT DETECTED Final   Candida krusei NOT DETECTED NOT  DETECTED Final   Candida parapsilosis NOT DETECTED NOT DETECTED Final   Candida tropicalis NOT DETECTED NOT DETECTED Final   Cryptococcus neoformans/gattii NOT DETECTED NOT DETECTED Final   CTX-M ESBL NOT DETECTED NOT DETECTED Final   Carbapenem resistance IMP NOT DETECTED NOT DETECTED Final   Carbapenem resistance KPC NOT DETECTED NOT DETECTED Final   Carbapenem resistance NDM NOT DETECTED NOT DETECTED Final   Carbapenem resist OXA 48 LIKE NOT DETECTED NOT DETECTED Final   Carbapenem resistance VIM NOT DETECTED NOT DETECTED Final    Comment: Performed at [facility], [address] [place], [place] [address]  Culture, blood (routine x 2)     Status: Abnormal   Collection Time: 06/13/24  4:39 AM   Specimen: BLOOD  Result Value Ref Range Status   Specimen Description BLOOD SITE NOT SPECIFIED  Final   Special Requests   Final    BOTTLES DRAWN AEROBIC AND ANAEROBIC Blood Culture adequate volume   Culture  Setup Time   Final    GRAM NEGATIVE RODS IN BOTH AEROBIC AND ANAEROBIC BOTTLES CRITICAL VALUE NOTED.  VALUE IS CONSISTENT WITH PREVIOUSLY REPORTED AND CALLED VALUE.    Culture (A)  Final    KLEBSIELLA PNEUMONIAE SUSCEPTIBILITIES PERFORMED ON PREVIOUS CULTURE WITHIN THE LAST 5 DAYS. Performed at [facility], [address] [place], [place] [address]    Report Status 06/15/2024 FINAL  Final  Urine Culture     Status: Abnormal   Collection Time: 06/13/24  5:59 AM   Specimen: Urine, Clean Catch  Result Value Ref Range Status   Specimen Description URINE, CLEAN CATCH  Final   Special Requests   Final    NONE Performed at [facility], [address], [place], [place] [address]    Culture >=100,000 COLONIES/mL KLEBSIELLA PNEUMONIAE (A)  Final   Report Status 06/15/2024 FINAL  Final   Organism ID, Bacteria KLEBSIELLA PNEUMONIAE (A)  Final      Susceptibility   Klebsiella pneumoniae - MIC*    AMPICILLIN >=32 RESISTANT Resistant     CEFAZOLIN  (URINE) Value in  next row Sensitive      2 SENSITIVEThis is a modified FDA-approved test that has been validated and its performance characteristics determined by the reporting laboratory.  This laboratory is certified under the Clinical Laboratory Improvement Amendments CLIA as qualified to perform high complexity clinical laboratory testing.    CEFEPIME Value in next row Sensitive      2 SENSITIVEThis is a modified FDA-approved test that has been validated and its performance characteristics determined by the reporting laboratory.  This  laboratory is certified under the Clinical Laboratory Improvement Amendments CLIA as qualified to perform high complexity clinical laboratory testing.    ERTAPENEM Value in next row Sensitive      2 SENSITIVEThis is a modified FDA-approved test that has been validated and its performance characteristics determined by the reporting laboratory.  This laboratory is certified under the Clinical Laboratory Improvement Amendments CLIA as qualified to perform high complexity clinical laboratory testing.    CEFTRIAXONE  Value in next row Sensitive      2 SENSITIVEThis is a modified FDA-approved test that has been validated and its performance characteristics determined by the reporting laboratory.  This laboratory is certified under the Clinical Laboratory Improvement Amendments CLIA as qualified to perform high complexity clinical laboratory testing.    CIPROFLOXACIN Value in next row Sensitive      2 SENSITIVEThis is a modified FDA-approved test that has been validated and its performance characteristics determined by the reporting laboratory.  This laboratory is certified under the Clinical Laboratory Improvement Amendments CLIA as qualified to perform high complexity clinical laboratory testing.    GENTAMICIN Value in next row Sensitive      2 SENSITIVEThis is a modified FDA-approved test that has been validated and its performance characteristics determined by the reporting laboratory.  This  laboratory is certified under the Clinical Laboratory Improvement Amendments CLIA as qualified to perform high complexity clinical laboratory testing.    NITROFURANTOIN Value in next row Intermediate      2 SENSITIVEThis is a modified FDA-approved test that has been validated and its performance characteristics determined by the reporting laboratory.  This laboratory is certified under the Clinical Laboratory Improvement Amendments CLIA as qualified to perform high complexity clinical laboratory testing.    TRIMETH/SULFA Value in next row Sensitive      2 SENSITIVEThis is a modified FDA-approved test that has been validated and its performance characteristics determined by the reporting laboratory.  This laboratory is certified under the Clinical Laboratory Improvement Amendments CLIA as qualified to perform high complexity clinical laboratory testing.    AMPICILLIN/SULBACTAM Value in next row Sensitive      2 SENSITIVEThis is a modified FDA-approved test that has been validated and its performance characteristics determined by the reporting laboratory.  This laboratory is certified under the Clinical Laboratory Improvement Amendments CLIA as qualified to perform high complexity clinical laboratory testing.    PIP/TAZO Value in next row Sensitive      <=4 SENSITIVEThis is a modified FDA-approved test that has been validated and its performance characteristics determined by the reporting laboratory.  This laboratory is certified under the Clinical Laboratory Improvement Amendments CLIA as qualified to perform high complexity clinical laboratory testing.    MEROPENEM Value in next row Sensitive      <=4 SENSITIVEThis is a modified FDA-approved test that has been validated and its performance characteristics determined by the reporting laboratory.  This laboratory is certified under the Clinical Laboratory Improvement Amendments CLIA as qualified to perform high complexity clinical laboratory testing.    *  >=100,000 COLONIES/mL KLEBSIELLA PNEUMONIAE    Please note: You were cared for by a hospitalist during your hospital stay. Once you are discharged, your primary care physician will handle any further medical issues. Please note that NO REFILLS for any discharge medications will be authorized once you are discharged, as it is imperative that you return to your primary care physician (or establish a relationship with a primary care physician if you do not have one) for your post  hospital discharge needs so that they can reassess your need for medications and monitor your lab values.    Time coordinating discharge: 40 minutes  SIGNED:   Ivonne Mustache, MD  [facility] 06/17/2024, 10:52 AM Pager [phone]  If 7PM-7AM, please contact night-coverage [email or web address] Password [email or web address]     [1]  Allergies Allergen Reactions   Metformin  And Related Diarrhea and Nausea Only

## 2024-06-18 ENCOUNTER — Telehealth: Payer: Self-pay | Admitting: *Deleted

## 2024-06-18 NOTE — Transitions of Care (Post Inpatient/ED Visit) (Signed)
 "  06/18/2024  Name: Larry Fowler MRN: [id number] DOB: [birth year]  Today's TOC FU Call Status: Today's TOC FU Call Status:: Unsuccessful Call (2nd Attempt) Unsuccessful Call (2nd Attempt) Date: 06/18/24  Patient's Name and Date of Birth confirmed.    Transition Care Management Follow-up Telephone Call Date of Discharge: 06/17/24 Discharge Facility: [facility]) Type of Discharge: Inpatient Admission Primary Inpatient Discharge Diagnosis:: Sepsis- UTI; pyelonephritis; renal stone How have you been since you were released from the hospital?: Better (I am okay I guess; my wife handles all of this stuff you are asking me about- you can ask the questions, but these questions are too much; why do you have to ask all of this?) Any questions or concerns?: No  Patient verbalizes annoyance with TOC call from time of call initiation, throughout entirety of outreach and is verbally confrontational throughout [facility] call- however- he states he wishes to proceed and complete call   Items Reviewed: Did you receive and understand the discharge instructions provided?: Yes (thoroughly reviewed with patient who verbalizes fair understanding of same) Medications obtained,verified, and reconciled?: Yes (Medications Reviewed) (Full medication reconciliation/ review completed; no concerns or discrepancies identified; confirmed patient obtained/ is taking all newly Rx'd medications as instructed; spouse-manages medications and denies questions/ concerns around medications today) Any new allergies since your discharge?: No Dietary orders reviewed?: Yes Type of Diet Ordered:: Regular Do you have support at home?: Yes People in Home [RPT]: spouse Name of Support/Comfort Primary Source: Reports independent in self-care activities; resides with supportive spouse- assists as/ if needed/ indicated  Medications Reviewed Today: Medications Reviewed Today     Reviewed by Jazsmine Macari M, RN (Registered Nurse)  on 06/18/24 at 1307  Med List Status: <None>   Medication Order Taking? Sig Documenting Provider Last Dose Status Informant  Accu-Chek Softclix Lancets lancets [id number] Yes Use to check blood sugar up to four times daily as directed. (FOR ICD-10 E10.9, E11.9). Jillian Buttery, MD  Active   albuterol  (VENTOLIN  HFA) 108 (90 Base) MCG/ACT inhaler [id number] Yes Inhale 2 puffs into the lungs every 6 (six) hours as needed for wheezing or shortness of breath. Alvia Corean CROME, FNP  Active Self, Spouse/Significant Other, Pharmacy Records  apixaban  (ELIQUIS ) 5 MG TABS tablet [id number] Yes Take 2 tablets (10 mg total) by mouth 2 (two) times daily for 6 days THEN take 1 tablet (5 mg total) by mouth 2 (two) times daily thereafter. Norleen Lynwood ORN, MD  Active Self, Spouse/Significant Other, Pharmacy Records  benzonatate  (TESSALON ) 200 MG capsule [id number] Yes Take 1 capsule (200 mg total) by mouth 3 (three) times daily as needed for cough. Merlynn Niki FALCON, FNP  Active Self, Spouse/Significant Other, Pharmacy Records  Blood Glucose Monitoring Suppl (BLOOD GLUCOSE MONITOR SYSTEM) w/Device KIT [id number] Yes Use to check blood sugar up to four times daily as directed. (FOR ICD-10 E10.9, E11.9). Jillian Buttery, MD  Active   cefadroxil  (DURICEF) 500 MG capsule [id number] Yes Take 2 capsules (1,000 mg total) by mouth 2 (two) times daily for 6 days. Jillian Buttery, MD  Active   clotrimazole  (LOTRIMIN ) 1 % cream [id number] Yes Apply to affected area 2 times daily Bero, Michael M, MD  Active Self, Spouse/Significant Other, Pharmacy Records  cyclobenzaprine  (FLEXERIL ) 10 MG tablet [id number] Yes Take 1 tablet (10 mg total) by mouth 3 (three) times daily as needed for muscle spasms. Alvia Corean CROME, FNP  Active Self, Spouse/Significant Other, Pharmacy Records  diphenhydrAMINE  HCl (BENADRYL  MAXIMUM STRENGTH EXARIZONA [id number] Yes Apply 1 application  topically 2 (two) times daily as needed (irritation of perineum). [provider]  Active Self, Spouse/Significant Other, Pharmacy Records  Glucose Blood (BLOOD GLUCOSE TEST STRIPS) STRP [id number] Yes Use to check blood sugar up to four times daily as directed. (FOR ICD-10 E10.9, E11.9). Jillian Buttery, MD  Active   guaiFENesin -dextromethorphan  (ROBITUSSIN DM) 100-10 MG/5ML syrup [id number] Yes Take 15 mLs by mouth every 6 (six) hours as needed for cough. Jillian Buttery, MD  Active   HYDROcodone -acetaminophen  (NORCO/VICODIN) 5-325 MG tablet [id number] Yes Take 1 tablet by mouth every 6 (six) hours as needed for moderate pain (pain score 4-6) or severe pain (pain score 7-10). Alvia Corean CROME, FNP  Active Self, Spouse/Significant Other, Pharmacy Records  Insulin  Glargine (BASAGLAR  [facility]) 100 UNIT/ML [id number] Yes Inject 32 Units into the skin daily. May substitute as needed per insurance. Jillian Buttery, MD  Active   insulin  lispro (HUMALOG ) 100 UNIT/ML KwikPen [id number] Yes Inject 15 Units into the skin 3 (three) times daily. If eating and Blood Glucose (BG) 80 or higher inject 15 units for meal coverage and add correction dose per scale. If not eating, correction dose only. BG <150= 0 unit; BG 150-200= 1 unit; BG 201-250= 2 unit; BG 251-300= 3 unit; BG 301-350= 4 unit; BG 351-400= 5 unit; BG >400= 6 unit and Call Primary Care. Jillian Buttery, MD  Active   Insulin  Pen Needle 32G X 4 MM MISC [id number] Yes Use to check blood sugar up to four times daily as directed. (FOR ICD-10 E10.9, E11.9). Jillian Buttery, MD  Active   Lancet Device MISC [id number] Yes 1 each by Does not apply route as directed. Dispense based on patient and insurance preference. Use up to four times daily as directed. (FOR ICD-10 E10.9, E11.9). Jillian Buttery, MD  Active   Nystatin  (GERHARDT'S BUTT CREAM) CREA [id number] Yes Apply 1 Application topically 3 (three) times daily. Jillian Buttery, MD  Active   phenazopyridine  (PYRIDIUM ) 100 MG tablet [id number] Yes Take 2 tablets (200 mg total) by  mouth 3 (three) times daily with meals. Jillian Buttery, MD  Active   pioglitazone  (ACTOS ) 30 MG tablet [id number] Yes Take 1 tablet (30 mg total) by mouth daily.  Patient taking differently: Take 30 mg by mouth daily. 06/18/24:  reports not currently taking after hospital discharge 06/17/24- they told me to stop taking this until I see my doctor   Alvia Corean CROME, FNP  Active Self, Spouse/Significant Other, Pharmacy Records  potassium chloride  SA (KLOR-CON  M) 20 MEQ tablet [id number] Yes Take 2 tablets (40 mEq total) by mouth daily. Jillian Buttery, MD  Active   tamsulosin  (FLOMAX ) 0.4 MG CAPS capsule [id number] Yes Take 1 capsule (0.4 mg total) by mouth daily. Alvia Corean CROME, FNP  Active Self, Spouse/Significant Other, Pharmacy Records           Home Care and Equipment/Supplies: Were Home Health Services Ordered?: No Any new equipment or medical supplies ordered?: No  Functional Questionnaire: Do you need assistance with bathing/showering or dressing?: No Do you need assistance with meal preparation?: No Do you need assistance with eating?: No Do you have difficulty maintaining continence: No Do you need assistance with getting out of bed/getting out of a chair/moving?: No Do you have difficulty managing or taking your medications?: Yes (Reports spouse manages medications)  Follow up appointments reviewed: PCP Follow-up appointment confirmed?: Yes [facility] follow up PCP appointment successfully scheduled by [facility] RN CM in real-time 06/19/24) Date of PCP follow-up appointment?: 06/19/24 Follow-up Provider: PCP- Corean  [facility] Follow-up appointment confirmed?: Yes Date of Specialist follow-up appointment?: 07/11/24 Follow-Up Specialty Provider:: Urology provider: [facility]: confirmed patient also has contact information for [facility] as per discharge instructions/ AVS Do you need transportation to your follow-up appointment?: No Do you understand  care options if your condition(s) worsen?: Yes-patient verbalized understanding  SDOH Interventions Today    Flowsheet Row Most Recent Value  SDOH Interventions   Food Insecurity Interventions Intervention Not Indicated  [During TOC call today: patient adamantly denies food insecurity]  Housing Interventions Intervention Not Indicated  [During TOC call today: patient adamantly denies housing insecurity]  Transportation Interventions Intervention Not Indicated  [During TOC call today: patient adamantly denies transportation needs: reports either I drive myself or my wife drives me]  Utilities Interventions Intervention Not Indicated  [During TOC call today: patient adamantly denies utility insecurity]   See TOC assessment tabs for additional assessment/ TOC intervention information  Patient declines need for ongoing/ further care management/ coordination outreach; declines enrollment in 30-day TOC program- declines taking my direct phone number should needs/ concerns arise post-TOC call   Pls call/ message for questions,  Nikeia Henkes Mckinney Avalina Benko, RN, BSN, Media Planner  Transitions of Care  [facility] - [facility]  [place] [phone]: direct office  "

## 2024-06-18 NOTE — Transitions of Care (Post Inpatient/ED Visit) (Signed)
" ° °  06/18/2024  Name: MAREK NGHIEM MRN: [id number] DOB: Jan 15, 1971  Today's TOC FU Call Status: Today's TOC FU Call Status:: Unsuccessful Call (1st Attempt) Unsuccessful Call (1st Attempt) Date: 06/18/24  Attempted to reach the patient regarding the most recent Inpatient visit.  Left HIPAA compliant voice message   Follow Up Plan: Additional outreach attempts will be made to reach the patient to complete the Transitions of Care (Post Inpatient/ED visit) call.   Pls call/ message for questions,  Metzli Pollick Mckinney Yaroslav Gombos, RN, BSN, CCRN Alumnus RN Care Manager  Transitions of Care  [facility] [phone]: direct office  "

## 2024-06-19 ENCOUNTER — Ambulatory Visit: Payer: Self-pay

## 2024-06-19 ENCOUNTER — Encounter: Payer: Self-pay | Admitting: Family Medicine

## 2024-06-19 ENCOUNTER — Ambulatory Visit (INDEPENDENT_AMBULATORY_CARE_PROVIDER_SITE_OTHER): Payer: Self-pay | Admitting: Family Medicine

## 2024-06-19 VITALS — BP 110/78 | HR 79 | Temp 98.0°F | Ht 74.0 in | Wt >= 6400 oz

## 2024-06-19 DIAGNOSIS — A419 Sepsis, unspecified organism: Secondary | ICD-10-CM

## 2024-06-19 DIAGNOSIS — E1165 Type 2 diabetes mellitus with hyperglycemia: Secondary | ICD-10-CM

## 2024-06-19 DIAGNOSIS — N200 Calculus of kidney: Secondary | ICD-10-CM

## 2024-06-19 DIAGNOSIS — N390 Urinary tract infection, site not specified: Secondary | ICD-10-CM

## 2024-06-19 DIAGNOSIS — R6520 Severe sepsis without septic shock: Secondary | ICD-10-CM

## 2024-06-19 DIAGNOSIS — J4530 Mild persistent asthma, uncomplicated: Secondary | ICD-10-CM

## 2024-06-19 DIAGNOSIS — Z7409 Other reduced mobility: Secondary | ICD-10-CM

## 2024-06-19 DIAGNOSIS — J9600 Acute respiratory failure, unspecified whether with hypoxia or hypercapnia: Secondary | ICD-10-CM

## 2024-06-19 DIAGNOSIS — Z794 Long term (current) use of insulin: Secondary | ICD-10-CM

## 2024-06-19 DIAGNOSIS — R509 Fever, unspecified: Secondary | ICD-10-CM

## 2024-06-19 DIAGNOSIS — R10A2 Flank pain, left side: Secondary | ICD-10-CM

## 2024-06-19 DIAGNOSIS — Z7901 Long term (current) use of anticoagulants: Secondary | ICD-10-CM

## 2024-06-19 DIAGNOSIS — I2782 Chronic pulmonary embolism: Secondary | ICD-10-CM

## 2024-06-19 DIAGNOSIS — J4540 Moderate persistent asthma, uncomplicated: Secondary | ICD-10-CM

## 2024-06-19 DIAGNOSIS — R7881 Bacteremia: Secondary | ICD-10-CM

## 2024-06-19 DIAGNOSIS — Z6841 Body Mass Index (BMI) 40.0 and over, adult: Secondary | ICD-10-CM

## 2024-06-19 DIAGNOSIS — K759 Inflammatory liver disease, unspecified: Secondary | ICD-10-CM

## 2024-06-19 DIAGNOSIS — R051 Acute cough: Secondary | ICD-10-CM

## 2024-06-19 MED ORDER — OXYCODONE HCL 5 MG PO CAPS: 5.0000 mg | ORAL_CAPSULE | Freq: Four times a day (QID) | ORAL | 0 refills | Status: AC | PRN

## 2024-06-19 MED ORDER — ALBUTEROL SULFATE HFA 108 (90 BASE) MCG/ACT IN AERS: 2.0000 | INHALATION_SPRAY | Freq: Four times a day (QID) | RESPIRATORY_TRACT | 6 refills | Status: AC | PRN

## 2024-06-19 MED ORDER — HYDROCOD POLI-CHLORPHE POLI ER 10-8 MG/5ML PO SUER: 5.0000 mL | Freq: Two times a day (BID) | ORAL | 0 refills | Status: AC

## 2024-06-19 NOTE — Progress Notes (Signed)
 "  Acute Office Visit  Subjective:     Patient ID: Larry Fowler, male    DOB: [birth year], 54 y.o.   MRN: [id number]  Chief Complaint  Patient presents with   Hospitalization Follow-up    HPI  Discussed the use of AI scribe software for clinical note transcription with the patient, who gave verbal consent to proceed.  HFU  History of Present Illness Larry Fowler is a 54 year old male who presents with jaundice and persistent symptoms following a recent hospitalization for dehydration and infection. He is accompanied by his wife.  Jaundice and constitutional symptoms - Jaundice with yellow skin since the day after Christmas Larry Fowler urine - Fever, fatigue, and flu-like symptoms - Symptoms only partially improved after hospitalization and IV antibiotics - Significant weight loss - Poor oral intake due to pain and discomfort with eating and drinking - Struggling to maintain adequate hydration despite drinking diet soda and flavored water  Abdominal and flank pain - Abdominal pain and left-sided pain associated with kidney stones - Recent CT showed a 10 x 8 mm stone in the right kidney - Believes a prior 3 mm left stone was passed but persistent left-sided pain remains  Urinary symptoms - Urethral spasms and stabbing pain when stopping urine flow - Pain worsens with urination - Taking Pyridium  for urinary discomfort  Glycemic control - Recently started on Humalog  and Basaglar  insulin  therapy - Uncertain about dose adjustments when not eating - Persistently elevated blood glucose despite insulin  use  Respiratory symptoms - Persistent cough, shortness of breath, and wheezing - Oxygen saturation around 92% - Limited relief with Tessalon  Perles and Robitussin  Neuropsychiatric symptoms - Vivid dreams and hallucinations associated with illness and medications     ROS Per HPI      Objective:    BP 110/78 (BP Location: Left Arm, Patient Position:  Sitting)   Pulse 79   Temp 98 F (36.7 C) (Temporal)   Ht 6' 2 (1.88 m)   Wt (!) 451 lb (204.6 kg)   SpO2 92%   BMI 57.90 kg/m    Physical Exam Vitals and nursing note reviewed.  Constitutional:      General: He is not in acute distress.    Appearance: He is obese.     Comments: Appears fatigued  HENT:     Head: Normocephalic and atraumatic.     Right Ear: External ear normal.     Left Ear: External ear normal.     Nose: Nose normal.     Mouth/Throat:     Mouth: Mucous membranes are moist.  Eyes:     General: Scleral icterus present.     Extraocular Movements: Extraocular movements intact.  Cardiovascular:     Rate and Rhythm: Normal rate and regular rhythm.     Pulses: Normal pulses.     Heart sounds: Normal heart sounds.  Pulmonary:     Effort: Pulmonary effort is normal. No respiratory distress.     Breath sounds: Normal breath sounds. No wheezing, rhonchi or rales.  Musculoskeletal:     Cervical back: Normal range of motion.     Right lower leg: No edema.     Left lower leg: No edema.     Comments: Using 2 wheel walker for ambulation assistance  Lymphadenopathy:     Cervical: No cervical adenopathy.  Skin:    General: Skin is warm and dry.     Coloration: Skin is jaundiced.  Neurological:  General: No focal deficit present.     Mental Status: He is alert and oriented to person, place, and time.  Psychiatric:        Mood and Affect: Mood normal.        Behavior: Behavior normal.     Results for orders placed or performed in visit on 06/19/24  Urine Culture   Specimen: Urine  Result Value Ref Range   Source: NOT GIVEN    Status: FINAL    Result: No Growth   CBC with Differential/Platelet  Result Value Ref Range   WBC 5.5 4.0 - 10.5 K/uL   RBC 4.66 4.22 - 5.81 Mil/uL   Hemoglobin 14.4 13.0 - 17.0 g/dL   HCT 56.7 60.9 - 47.9 %   MCV 92.7 78.0 - 100.0 fl   MCHC 33.4 30.0 - 36.0 g/dL   RDW 84.2 (H) 88.4 - 84.4 %   Platelets 185.0 150.0 - 400.0  K/uL   Neutrophils Relative % 57.9 43.0 - 77.0 %   Lymphocytes Relative 29.9 12.0 - 46.0 %   Monocytes Relative 10.4 3.0 - 12.0 %   Eosinophils Relative 1.2 0.0 - 5.0 %   Basophils Relative 0.6 0.0 - 3.0 %   Neutro Abs 3.2 1.4 - 7.7 K/uL   Lymphs Abs 1.7 0.7 - 4.0 K/uL   Monocytes Absolute 0.6 0.1 - 1.0 K/uL   Eosinophils Absolute 0.1 0.0 - 0.7 K/uL   Basophils Absolute 0.0 0.0 - 0.1 K/uL  Comprehensive metabolic panel with GFR  Result Value Ref Range   Sodium 131 (L) 135 - 145 mEq/L   Potassium 4.8 3.5 - 5.1 mEq/L   Chloride 93 (L) 96 - 112 mEq/L   CO2 29 19 - 32 mEq/L   Glucose, Bld 272 (H) 70 - 99 mg/dL   BUN 8 6 - 23 mg/dL   Creatinine, Ser 9.48 0.40 - 1.50 mg/dL   Total Bilirubin 5.5 (H) 0.2 - 1.2 mg/dL   Alkaline Phosphatase 233 (H) 39 - 117 U/L   AST 70 (H) 5 - 37 U/L   ALT 212 (H) 3 - 53 U/L   Total Protein 7.0 6.0 - 8.3 g/dL   Albumin 3.3 (L) 3.5 - 5.2 g/dL   GFR 883.20 >39.99 mL/min   Calcium 9.5 8.4 - 10.5 mg/dL        Assessment & Plan:   Assessment and Plan Assessment & Plan Sepsis with hepatitis and hyperglycemia Bacteremia with klebsiella pneumoniae Liver enzymes improving, jaundice resolving. Hyperglycemia persists due to infection and stress response. - Continue Basaglar  32 units daily and Humalog  15 units with each meal. - Monitor blood glucose levels regularly. - Encouraged adequate hydration and nutrition. - Ordered liver function tests.  Type 2 diabetes mellitus with hyperglycemia, on insulin  therapy Persistent hyperglycemia worsened due to recent infection and stress response. Current insulin  regimen includes Basaglar  and Humalog . - Continue Basaglar  32 units daily and Humalog  15 units with each meal. - Monitor blood glucose levels regularly. - Educated on maintaining blood glucose control despite infection.  Chronic Saddle Pulmonary Embolism, chronic anticoagulation - continue Eliquis  - chronic, stable, requires ongoing monitoring   Renal  calculus, recurrent urinary tract infection Recent passage of renal calculus with associated urinary tract infection. Pain and discomfort persist, possibly due to residual effects of stone passage. - Continue Pyridium  200 mg twice daily for symptomatic relief. - continued left flank pain - oxycodone  5mg  to pharmacy - Follow up with urologist on January 16th. - Ordered urine culture  to assess for ongoing infection.  Asthma with acute cough Acute exacerbation with persistent cough and dyspnea. Oxygen saturation at 92%. - Ordered chest x-ray to evaluate for pneumonia or other respiratory issues. - Prescribed Tessalon  Perles for cough management. - Prescribed oxycodone  for pain management without acetaminophen . - Ensure use of spacer with inhaler for optimal delivery.      Orders Placed This Encounter  Procedures   Urine Culture    Standing Status:   Future    Number of Occurrences:   1    Expiration Date:   07/20/2024   DG Chest 2 View    Standing Status:   Future    Number of Occurrences:   1    Expiration Date:   12/17/2024    Reason for Exam (SYMPTOM  OR DIAGNOSIS REQUIRED):   cough, recent sepsis    Preferred imaging location?:   [facility]   CBC with Differential/Platelet    Release to patient:   Immediate [1]   Comprehensive metabolic panel with GFR    Release to patient:   Immediate [1]   POCT URINALYSIS DIP (CLINITEK)     Meds ordered this encounter  Medications   chlorpheniramine-HYDROcodone  (TUSSIONEX) 10-8 MG/5ML    Sig: Take 5 mLs by mouth 2 (two) times daily.    Dispense:  300 mL    Refill:  0   oxycodone  (OXY-IR) 5 MG capsule    Sig: Take 1 capsule (5 mg total) by mouth every 6 (six) hours as needed.    Dispense:  21 capsule    Refill:  0   albuterol  (VENTOLIN  HFA) 108 (90 Base) MCG/ACT inhaler    Sig: Inhale 2 puffs into the lungs every 6 (six) hours as needed for wheezing or shortness of breath.    Dispense:  18 g    Refill:  6    Return in  about 3 weeks (around 07/10/2024).  Corean LITTIE Ku, FNP  "

## 2024-06-19 NOTE — Patient Instructions (Addendum)
 We are getting an xray today. We will be in contact with any abnormal results that require further attention.  We are checking labs today, will be in contact with any results that require further attention  I have sent in tussionex cough syrup for you to take 5 mL once daily in the evening as needed for cough. This medication may make you sleepy. Do not drive or operate heavy machinery while taking this medication.  Continue Basaglar  32 units once daily in the morning  Continue Humalog  15 units, three times daily with meals  Please finish antibiotic regimen  Continue other current medications  Follow up with urology as scheduled.   Follow up with in 2-3 weeks to check in.  If symptoms worsen, fever returns, you have shortness of breath, severe pain, please go to the ER for further evaluation and treatment.

## 2024-06-20 LAB — CBC WITH DIFFERENTIAL/PLATELET
Basophils Absolute: 0 K/uL (ref 0.0–0.1)
Basophils Relative: 0.6 % (ref 0.0–3.0)
Eosinophils Absolute: 0.1 K/uL (ref 0.0–0.7)
Eosinophils Relative: 1.2 % (ref 0.0–5.0)
HCT: 43.2 % (ref 39.0–52.0)
Hemoglobin: 14.4 g/dL (ref 13.0–17.0)
Lymphocytes Relative: 29.9 % (ref 12.0–46.0)
Lymphs Abs: 1.7 K/uL (ref 0.7–4.0)
MCHC: 33.4 g/dL (ref 30.0–36.0)
MCV: 92.7 fl (ref 78.0–100.0)
Monocytes Absolute: 0.6 K/uL (ref 0.1–1.0)
Monocytes Relative: 10.4 % (ref 3.0–12.0)
Neutro Abs: 3.2 K/uL (ref 1.4–7.7)
Neutrophils Relative %: 57.9 % (ref 43.0–77.0)
Platelets: 185 K/uL (ref 150.0–400.0)
RBC: 4.66 Mil/uL (ref 4.22–5.81)
RDW: 15.7 % — ABNORMAL HIGH (ref 11.5–15.5)
WBC: 5.5 K/uL (ref 4.0–10.5)

## 2024-06-20 LAB — URINE CULTURE: Result:: NO GROWTH

## 2024-06-20 LAB — COMPREHENSIVE METABOLIC PANEL WITH GFR
ALT: 212 U/L — ABNORMAL HIGH (ref 3–53)
AST: 70 U/L — ABNORMAL HIGH (ref 5–37)
Albumin: 3.3 g/dL — ABNORMAL LOW (ref 3.5–5.2)
Alkaline Phosphatase: 233 U/L — ABNORMAL HIGH (ref 39–117)
BUN: 8 mg/dL (ref 6–23)
CO2: 29 meq/L (ref 19–32)
Calcium: 9.5 mg/dL (ref 8.4–10.5)
Chloride: 93 meq/L — ABNORMAL LOW (ref 96–112)
Creatinine, Ser: 0.51 mg/dL (ref 0.40–1.50)
GFR: 116.79 mL/min
Glucose, Bld: 272 mg/dL — ABNORMAL HIGH (ref 70–99)
Potassium: 4.8 meq/L (ref 3.5–5.1)
Sodium: 131 meq/L — ABNORMAL LOW (ref 135–145)
Total Bilirubin: 5.5 mg/dL — ABNORMAL HIGH (ref 0.2–1.2)
Total Protein: 7 g/dL (ref 6.0–8.3)

## 2024-06-21 ENCOUNTER — Ambulatory Visit: Payer: Self-pay | Admitting: Family Medicine

## 2024-06-22 ENCOUNTER — Telehealth: Payer: Self-pay | Admitting: Family Medicine

## 2024-06-22 ENCOUNTER — Other Ambulatory Visit: Payer: Self-pay

## 2024-06-22 DIAGNOSIS — R7989 Other specified abnormal findings of blood chemistry: Secondary | ICD-10-CM

## 2024-06-22 NOTE — Telephone Encounter (Signed)
 Patient dropped off document Patient Assistance Application, to be filled out by provider. Patient requested to send it back via Call Patient to pick up within 7-days. Document is located in providers tray at front office.Please advise at Mobile [phone] (mobile)

## 2024-06-25 ENCOUNTER — Encounter: Payer: Self-pay | Admitting: Family Medicine

## 2024-06-26 NOTE — Telephone Encounter (Signed)
 Forms received, given to pcp.

## 2024-06-29 ENCOUNTER — Other Ambulatory Visit: Payer: Self-pay

## 2024-07-02 NOTE — Telephone Encounter (Unsigned)
 Copied from CRM #[id number]. Topic: General - Other >> Jun 29, 2024  4:09 PM Delon DASEN wrote: Reason for CRM: Patient is going to come and pick up the forms for [facility], they received them but they very blurry and not readable. [phone]

## 2024-07-05 ENCOUNTER — Telehealth: Payer: Self-pay | Admitting: Family Medicine

## 2024-07-05 ENCOUNTER — Other Ambulatory Visit: Payer: Self-pay

## 2024-07-05 DIAGNOSIS — R7989 Other specified abnormal findings of blood chemistry: Secondary | ICD-10-CM

## 2024-07-05 LAB — COMPREHENSIVE METABOLIC PANEL WITH GFR
ALT: 21 U/L (ref 3–53)
AST: 23 U/L (ref 5–37)
Albumin: 3.8 g/dL (ref 3.5–5.2)
Alkaline Phosphatase: 100 U/L (ref 39–117)
BUN: 17 mg/dL (ref 6–23)
CO2: 33 meq/L — ABNORMAL HIGH (ref 19–32)
Calcium: 9.8 mg/dL (ref 8.4–10.5)
Chloride: 99 meq/L (ref 96–112)
Creatinine, Ser: 0.48 mg/dL (ref 0.40–1.50)
GFR: 118.91 mL/min
Glucose, Bld: 247 mg/dL — ABNORMAL HIGH (ref 70–99)
Potassium: 3.9 meq/L (ref 3.5–5.1)
Sodium: 136 meq/L (ref 135–145)
Total Bilirubin: 1.5 mg/dL — ABNORMAL HIGH (ref 0.2–1.2)
Total Protein: 7.2 g/dL (ref 6.0–8.3)

## 2024-07-05 NOTE — Telephone Encounter (Signed)
 Patient left handicap placard form to be filled out.  Form placed in [place] box up front.

## 2024-07-05 NOTE — Telephone Encounter (Unsigned)
 Copied from CRM #[id number]. Topic: General - Other >> Jun 29, 2024  4:09 PM Delon DASEN wrote: Reason for CRM: Patient is going to come and pick up the forms for [facility], they received them but they very blurry and not readable. [phone] >> Jul 05, 2024  9:43 AM Pinkey ORN wrote: [place] [phone] / fax [phone]  Called on behalf of patient documents, states she's unable to read the information that was filled out by the provider. Also states the information needed as far as patient's household size and income, was also missing from the documents followed by patient's consent (patient didn't sign his name, nor time stamp it).

## 2024-07-05 NOTE — Telephone Encounter (Signed)
 Form given to PCP

## 2024-07-05 NOTE — Telephone Encounter (Signed)
 Noted.

## 2024-07-06 NOTE — Telephone Encounter (Signed)
Form completed, patient aware.

## 2024-07-10 ENCOUNTER — Other Ambulatory Visit: Payer: Self-pay | Admitting: Family Medicine

## 2024-07-10 ENCOUNTER — Telehealth: Payer: Self-pay

## 2024-07-10 ENCOUNTER — Other Ambulatory Visit: Payer: Self-pay

## 2024-07-10 MED ORDER — INSULIN LISPRO (1 UNIT DIAL) 100 UNIT/ML (KWIKPEN)
15.0000 [IU] | PEN_INJECTOR | Freq: Three times a day (TID) | SUBCUTANEOUS | 0 refills | Status: AC
  Filled 2024-07-10 (×2): qty 15, 34d supply, fill #0

## 2024-07-10 MED ORDER — BASAGLAR KWIKPEN 100 UNIT/ML ~~LOC~~ SOPN
32.0000 [IU] | PEN_INJECTOR | Freq: Every day | SUBCUTANEOUS | 0 refills | Status: AC
  Filled 2024-07-10: qty 15, 46d supply, fill #0
  Filled 2024-07-10: qty 9, 28d supply, fill #0

## 2024-07-11 ENCOUNTER — Other Ambulatory Visit: Payer: Self-pay | Admitting: Family Medicine

## 2024-07-11 ENCOUNTER — Other Ambulatory Visit: Payer: Self-pay

## 2024-07-11 DIAGNOSIS — Z794 Long term (current) use of insulin: Secondary | ICD-10-CM

## 2024-07-11 DIAGNOSIS — E1165 Type 2 diabetes mellitus with hyperglycemia: Secondary | ICD-10-CM

## 2024-07-11 MED ORDER — LANCETS 28G THIN MISC
6 refills | Status: AC
  Filled 2024-07-11: qty 200, 100d supply, fill #0

## 2024-07-11 MED ORDER — TRUE METRIX BLOOD GLUCOSE TEST VI STRP
ORAL_STRIP | 12 refills | Status: DC
  Filled 2024-07-11: qty 100, 25d supply, fill #0

## 2024-07-11 MED ORDER — TRUE METRIX METER DEVI: 1.0000 | Freq: Four times a day (QID) | 0 refills | Status: DC

## 2024-07-11 MED ORDER — INSULIN PEN NEEDLE 32G X 4 MM MISC
1.0000 | 3 refills | Status: AC
  Filled 2024-07-11: qty 100, 25d supply, fill #0

## 2024-07-11 MED ORDER — TRUE METRIX BLOOD GLUCOSE TEST VI STRP: ORAL_STRIP | 12 refills | Status: AC

## 2024-07-11 MED ORDER — TRUE METRIX METER W/DEVICE KIT
1.0000 | PACK | Freq: Four times a day (QID) | 0 refills | Status: AC
  Filled 2024-07-11: qty 1, 30d supply, fill #0

## 2024-07-11 NOTE — Telephone Encounter (Signed)
 Spoke with patient, please review labs from 1/29. Requested refills, sent in

## 2024-07-12 ENCOUNTER — Ambulatory Visit: Payer: Self-pay | Admitting: Family Medicine

## 2024-07-13 ENCOUNTER — Other Ambulatory Visit: Payer: Self-pay
